# Patient Record
Sex: Male | Born: 1940
Health system: Southern US, Community
[De-identification: ages and names within clinical notes are randomized; demographics above are authoritative.]

## PROBLEM LIST (undated history)

## (undated) DIAGNOSIS — K529 Noninfective gastroenteritis and colitis, unspecified: Secondary | ICD-10-CM

## (undated) DIAGNOSIS — D696 Thrombocytopenia, unspecified: Secondary | ICD-10-CM

## (undated) DIAGNOSIS — Z951 Presence of aortocoronary bypass graft: Secondary | ICD-10-CM

## (undated) DIAGNOSIS — E1121 Type 2 diabetes mellitus with diabetic nephropathy: Secondary | ICD-10-CM

## (undated) DIAGNOSIS — Z87448 Personal history of other diseases of urinary system: Secondary | ICD-10-CM

## (undated) DIAGNOSIS — E1129 Type 2 diabetes mellitus with other diabetic kidney complication: Secondary | ICD-10-CM

## (undated) DIAGNOSIS — M545 Low back pain: Secondary | ICD-10-CM

## (undated) DIAGNOSIS — Z87442 Personal history of urinary calculi: Secondary | ICD-10-CM

## (undated) DIAGNOSIS — I779 Disorder of arteries and arterioles, unspecified: Secondary | ICD-10-CM

## (undated) DIAGNOSIS — Z7901 Long term (current) use of anticoagulants: Secondary | ICD-10-CM

## (undated) DIAGNOSIS — E785 Hyperlipidemia, unspecified: Secondary | ICD-10-CM

## (undated) DIAGNOSIS — M1990 Unspecified osteoarthritis, unspecified site: Secondary | ICD-10-CM

## (undated) DIAGNOSIS — I2510 Atherosclerotic heart disease of native coronary artery without angina pectoris: Secondary | ICD-10-CM

## (undated) DIAGNOSIS — IMO0002 Reserved for concepts with insufficient information to code with codable children: Secondary | ICD-10-CM

## (undated) DIAGNOSIS — J449 Chronic obstructive pulmonary disease, unspecified: Secondary | ICD-10-CM

## (undated) DIAGNOSIS — I10 Essential (primary) hypertension: Secondary | ICD-10-CM

## (undated) DIAGNOSIS — D649 Anemia, unspecified: Secondary | ICD-10-CM

## (undated) DIAGNOSIS — I4891 Unspecified atrial fibrillation: Secondary | ICD-10-CM

## (undated) DIAGNOSIS — I350 Nonrheumatic aortic (valve) stenosis: Secondary | ICD-10-CM

## (undated) DIAGNOSIS — K922 Gastrointestinal hemorrhage, unspecified: Secondary | ICD-10-CM

## (undated) DIAGNOSIS — Z8719 Personal history of other diseases of the digestive system: Secondary | ICD-10-CM

## (undated) DIAGNOSIS — K219 Gastro-esophageal reflux disease without esophagitis: Secondary | ICD-10-CM

## (undated) HISTORY — DX: Type 2 diabetes mellitus with diabetic nephropathy: E11.21

## (undated) HISTORY — DX: Long term (current) use of anticoagulants: Z79.01

## (undated) HISTORY — DX: Presence of aortocoronary bypass graft: Z95.1

## (undated) HISTORY — DX: Type 2 diabetes mellitus with other diabetic kidney complication: E11.29

## (undated) HISTORY — DX: Essential (primary) hypertension: I10

## (undated) HISTORY — DX: Personal history of other diseases of the digestive system: Z87.19

## (undated) HISTORY — DX: Nonrheumatic aortic (valve) stenosis: I35.0

## (undated) HISTORY — DX: Unspecified atrial fibrillation: I48.91

## (undated) HISTORY — DX: Reserved for concepts with insufficient information to code with codable children: IMO0002

## (undated) HISTORY — DX: Hyperlipidemia, unspecified: E78.5

## (undated) HISTORY — DX: Low back pain: M54.5

## (undated) HISTORY — DX: Atherosclerotic heart disease of native coronary artery without angina pectoris: I25.10

---

## 1995-09-10 HISTORY — PX: CARDIAC SURGERY: SHX584

## 1995-09-10 HISTORY — PX: CORONARY ARTERY BYPASS GRAFT: SHX141

## 1998-05-02 ENCOUNTER — Encounter: Payer: Self-pay | Admitting: Emergency Medicine

## 1998-05-02 ENCOUNTER — Inpatient Hospital Stay (HOSPITAL_COMMUNITY): Admission: EM | Admit: 1998-05-02 | Discharge: 1998-05-03 | Payer: Self-pay | Admitting: Emergency Medicine

## 2002-09-09 HISTORY — PX: BACK SURGERY: SHX140

## 2003-08-20 ENCOUNTER — Ambulatory Visit (HOSPITAL_COMMUNITY): Admission: RE | Admit: 2003-08-20 | Discharge: 2003-08-20 | Payer: Self-pay | Admitting: Specialist

## 2003-08-29 ENCOUNTER — Ambulatory Visit (HOSPITAL_COMMUNITY): Admission: RE | Admit: 2003-08-29 | Discharge: 2003-08-29 | Payer: Self-pay | Admitting: Specialist

## 2003-09-05 ENCOUNTER — Ambulatory Visit (HOSPITAL_COMMUNITY): Admission: RE | Admit: 2003-09-05 | Discharge: 2003-09-06 | Payer: Self-pay | Admitting: Interventional Radiology

## 2003-09-10 HISTORY — PX: SHOULDER SURGERY: SHX246

## 2003-09-15 ENCOUNTER — Observation Stay (HOSPITAL_COMMUNITY): Admission: RE | Admit: 2003-09-15 | Discharge: 2003-09-16 | Payer: Self-pay | Admitting: Specialist

## 2003-12-28 ENCOUNTER — Observation Stay (HOSPITAL_COMMUNITY): Admission: RE | Admit: 2003-12-28 | Discharge: 2003-12-29 | Payer: Self-pay | Admitting: Specialist

## 2006-01-08 ENCOUNTER — Encounter: Payer: Self-pay | Admitting: Specialist

## 2012-06-09 DIAGNOSIS — J3089 Other allergic rhinitis: Secondary | ICD-10-CM | POA: Insufficient documentation

## 2012-06-09 DIAGNOSIS — L989 Disorder of the skin and subcutaneous tissue, unspecified: Secondary | ICD-10-CM | POA: Insufficient documentation

## 2012-06-09 DIAGNOSIS — D509 Iron deficiency anemia, unspecified: Secondary | ICD-10-CM | POA: Insufficient documentation

## 2012-06-09 DIAGNOSIS — Z72820 Sleep deprivation: Secondary | ICD-10-CM | POA: Insufficient documentation

## 2014-05-06 DIAGNOSIS — IMO0002 Reserved for concepts with insufficient information to code with codable children: Secondary | ICD-10-CM

## 2014-05-06 HISTORY — DX: Reserved for concepts with insufficient information to code with codable children: IMO0002

## 2015-10-26 DIAGNOSIS — E785 Hyperlipidemia, unspecified: Secondary | ICD-10-CM

## 2015-10-26 HISTORY — DX: Hyperlipidemia, unspecified: E78.5

## 2015-12-20 DIAGNOSIS — Z7901 Long term (current) use of anticoagulants: Secondary | ICD-10-CM

## 2015-12-20 DIAGNOSIS — I482 Chronic atrial fibrillation, unspecified: Secondary | ICD-10-CM

## 2015-12-20 DIAGNOSIS — I4891 Unspecified atrial fibrillation: Secondary | ICD-10-CM | POA: Insufficient documentation

## 2015-12-20 DIAGNOSIS — Z8719 Personal history of other diseases of the digestive system: Secondary | ICD-10-CM

## 2015-12-20 HISTORY — DX: Personal history of other diseases of the digestive system: Z87.19

## 2015-12-20 HISTORY — DX: Chronic atrial fibrillation, unspecified: I48.20

## 2015-12-20 HISTORY — DX: Long term (current) use of anticoagulants: Z79.01

## 2016-01-05 DIAGNOSIS — E1121 Type 2 diabetes mellitus with diabetic nephropathy: Secondary | ICD-10-CM

## 2016-01-05 DIAGNOSIS — I10 Essential (primary) hypertension: Secondary | ICD-10-CM

## 2016-01-05 DIAGNOSIS — I2510 Atherosclerotic heart disease of native coronary artery without angina pectoris: Secondary | ICD-10-CM

## 2016-01-05 DIAGNOSIS — I25119 Atherosclerotic heart disease of native coronary artery with unspecified angina pectoris: Secondary | ICD-10-CM | POA: Insufficient documentation

## 2016-01-05 HISTORY — DX: Type 2 diabetes mellitus with diabetic nephropathy: E11.21

## 2016-01-05 HISTORY — DX: Essential (primary) hypertension: I10

## 2016-01-05 HISTORY — DX: Atherosclerotic heart disease of native coronary artery without angina pectoris: I25.10

## 2016-05-02 DIAGNOSIS — M545 Low back pain, unspecified: Secondary | ICD-10-CM

## 2016-05-02 HISTORY — DX: Low back pain, unspecified: M54.50

## 2016-07-03 DIAGNOSIS — Z951 Presence of aortocoronary bypass graft: Secondary | ICD-10-CM | POA: Insufficient documentation

## 2016-07-03 DIAGNOSIS — E1129 Type 2 diabetes mellitus with other diabetic kidney complication: Secondary | ICD-10-CM

## 2016-07-03 HISTORY — DX: Presence of aortocoronary bypass graft: Z95.1

## 2016-07-03 HISTORY — DX: Type 2 diabetes mellitus with other diabetic kidney complication: E11.29

## 2016-09-20 ENCOUNTER — Other Ambulatory Visit: Payer: Self-pay | Admitting: Physician Assistant

## 2016-09-20 ENCOUNTER — Emergency Department
Admission: EM | Admit: 2016-09-20 | Discharge: 2016-09-20 | Disposition: A | Discharge status: Home / Self Care | Payer: Medicare Other | Source: Home / Self Care | Attending: Family Medicine | Admitting: Family Medicine

## 2016-09-20 ENCOUNTER — Encounter: Payer: Self-pay | Admitting: *Deleted

## 2016-09-20 ENCOUNTER — Emergency Department (INDEPENDENT_AMBULATORY_CARE_PROVIDER_SITE_OTHER): Payer: Medicare Other

## 2016-09-20 DIAGNOSIS — J209 Acute bronchitis, unspecified: Secondary | ICD-10-CM

## 2016-09-20 DIAGNOSIS — R05 Cough: Secondary | ICD-10-CM | POA: Diagnosis not present

## 2016-09-20 DIAGNOSIS — R0789 Other chest pain: Secondary | ICD-10-CM | POA: Diagnosis not present

## 2016-09-20 DIAGNOSIS — R0602 Shortness of breath: Secondary | ICD-10-CM | POA: Diagnosis not present

## 2016-09-20 HISTORY — DX: Gastro-esophageal reflux disease without esophagitis: K21.9

## 2016-09-20 MED ORDER — DEXAMETHASONE SODIUM PHOSPHATE 10 MG/ML IJ SOLN
10.0000 mg | Freq: Once | INTRAMUSCULAR | Status: AC
  Administered 2016-09-20: 10 mg via INTRAMUSCULAR

## 2016-09-20 MED ORDER — ALBUTEROL SULFATE HFA 108 (90 BASE) MCG/ACT IN AERS: 1.0000 | INHALATION_SPRAY | Freq: Four times a day (QID) | RESPIRATORY_TRACT | 0 refills | Status: DC | PRN

## 2016-09-20 MED ORDER — AEROCHAMBER PLUS W/MASK MISC: 2 refills | Status: DC

## 2016-09-20 MED ORDER — AZITHROMYCIN 250 MG PO TABS: 250.0000 mg | ORAL_TABLET | Freq: Every day | ORAL | 0 refills | Status: DC

## 2016-09-20 MED ORDER — BENZONATATE 100 MG PO CAPS: 100.0000 mg | ORAL_CAPSULE | Freq: Three times a day (TID) | ORAL | 0 refills | Status: DC

## 2016-09-20 MED ORDER — IPRATROPIUM-ALBUTEROL 0.5-2.5 (3) MG/3ML IN SOLN
3.0000 mL | Freq: Once | RESPIRATORY_TRACT | Status: AC
  Administered 2016-09-20: 3 mL via RESPIRATORY_TRACT

## 2016-09-20 NOTE — ED Provider Notes (Signed)
CSN: [id number]     Arrival date & time 09/20/16  1551 History   First MD Initiated Contact with Patient 09/20/16 1627     Chief Complaint  Patient presents with  . Cough   (Consider location/radiation/quality/duration/timing/severity/associated sxs/prior Treatment) HPI Garrett Hughes is a 76 y.o. male presenting to UC with hx of afib, accompanied by his wife with reports of 3-4 days of cough, congestion, fatigue, and Right side rib pain from cough. Worse today. Mild SOB.  Hx of afib but no hx of asthma or COPD.  Denies leg swelling. No known fever or chills.      Past Medical History:  Diagnosis Date  . Atrial fibrillation (HCC)   . Diabetes mellitus without complication (HCC)   . GERD (gastroesophageal reflux disease)   . Hyperlipidemia    History reviewed. No pertinent surgical history. History reviewed. No pertinent family history. Social History  Substance Use Topics  . Smoking status: Former Games developermoker  . Smokeless tobacco: Never Used  . Alcohol use No    Review of Systems  Constitutional: Positive for fatigue. Negative for chills and fever.  HENT: Positive for congestion and rhinorrhea. Negative for ear pain, sore throat, trouble swallowing and voice change.   Respiratory: Positive for cough, chest tightness and shortness of breath.   Cardiovascular: Negative for chest pain and palpitations.  Gastrointestinal: Negative for abdominal pain, diarrhea, nausea and vomiting.  Musculoskeletal: Negative for arthralgias, back pain and myalgias.  Skin: Negative for rash.    Allergies  Patient has no known allergies.  Home Medications   Prior to Admission medications   Medication Sig Start Date End Date Taking? Authorizing Provider  clopidogrel (PLAVIX) 75 MG tablet Take 75 mg by mouth daily.   Yes Historical Provider, MD  doxazosin (CARDURA) 2 MG tablet Take 2 mg by mouth daily.   Yes Historical Provider, MD  glipiZIDE (GLUCOTROL) 10 MG tablet Take 10 mg by mouth daily before  breakfast.   Yes Historical Provider, MD  lisinopril (PRINIVIL,ZESTRIL) 10 MG tablet Take 10 mg by mouth daily.   Yes Historical Provider, MD  metFORMIN (GLUCOPHAGE) 500 MG tablet Take by mouth 2 (two) times daily with a meal.   Yes Historical Provider, MD  metoprolol (LOPRESSOR) 50 MG tablet Take 50 mg by mouth 2 (two) times daily.   Yes Historical Provider, MD  omeprazole (PRILOSEC) 20 MG capsule Take 20 mg by mouth daily.   Yes Historical Provider, MD  pravastatin (PRAVACHOL) 20 MG tablet Take 20 mg by mouth daily.   Yes Historical Provider, MD  albuterol (PROVENTIL HFA;VENTOLIN HFA) 108 (90 Base) MCG/ACT inhaler Inhale 1-2 puffs into the lungs every 6 (six) hours as needed for wheezing or shortness of breath. 09/20/16   Junius FinnerErin O'Malley, PA-C  azithromycin (ZITHROMAX) 250 MG tablet Take 1 tablet (250 mg total) by mouth daily. Take first 2 tablets together, then 1 every day until finished. 09/20/16   Junius FinnerErin O'Malley, PA-C  benzonatate (TESSALON) 100 MG capsule Take 1 capsule (100 mg total) by mouth every 8 (eight) hours. 09/20/16   Junius FinnerErin O'Malley, PA-C  Spacer/Aero-Holding Chambers (AEROCHAMBER PLUS WITH MASK) inhaler Use as instructed 09/20/16   Junius FinnerErin O'Malley, PA-C   Meds Ordered and Administered this Visit   Medications  dexamethasone (DECADRON) injection 10 mg (10 mg Intramuscular Given 09/20/16 1723)  ipratropium-albuterol (DUONEB) 0.5-2.5 (3) MG/3ML nebulizer solution 3 mL (3 mLs Nebulization Given 09/20/16 1718)    BP 136/66 (BP Location: Left Arm)   Pulse 96  Temp 99.6 F (37.6 C) (Oral)   Resp 18   Ht 5\' 4"  (1.626 m)   Wt 150 lb (68 kg)   SpO2 90%   BMI 25.75 kg/m  No data found.   Physical Exam  Constitutional: He is oriented to person, place, and time. He appears well-developed and well-nourished. No distress.  HENT:  Head: Normocephalic and atraumatic.  Right Ear: Tympanic membrane normal.  Left Ear: Tympanic membrane normal.  Nose: Nose normal.  Mouth/Throat: Uvula is  midline, oropharynx is clear and moist and mucous membranes are normal.  Eyes: EOM are normal.  Neck: Normal range of motion. Neck supple.  Cardiovascular: Normal rate.  An irregularly irregular rhythm present.  Pulmonary/Chest: Effort normal. No stridor. No respiratory distress. He has wheezes. He has rales. He exhibits no tenderness.  Diffuse wheeze and coarse breath sounds w/o accessory muscle use.  Musculoskeletal: Normal range of motion.  Lymphadenopathy:    He has no cervical adenopathy.  Neurological: He is alert and oriented to person, place, and time.  Skin: Skin is warm and dry. He is not diaphoretic.  Psychiatric: He has a normal mood and affect. His behavior is normal.  Nursing note and vitals reviewed.   Urgent Care Course   Clinical Course     Procedures (including critical care time)  Labs Review Labs Reviewed - No data to display  Imaging Review Dg Chest 2 View  Result Date: 09/20/2016 CLINICAL DATA:  Shortness of breath, cough for 3 days, right side chest pain EXAM: CHEST  2 VIEW COMPARISON:  None. FINDINGS: Cardiomediastinal silhouette is unremarkable. Status post CABG. There is streaky left base retrocardiac atelectasis or infiltrate best seen on lateral view. No pulmonary edema. IMPRESSION: Streaky left base retrocardiac atelectasis or infiltrate best seen on lateral view. No pulmonary edema. Electronically Signed   By: Natasha Mead M.D.   On: 09/20/2016 17:08    MDM   1. Acute bronchitis, unspecified organism    Pt c/o worsening URI symptoms with cough and SOB. O2 Sat 90% on RA.  CXR: concerning for possible early pneumonia  Duoneb and decadron 10mg  IM given in UC O2 Sat improved to 94% on RA Diffuse coarse breath sounds still present. Pt and wife feel comfortable with pt being discharged home at this time.   Rx: Azithromycin, tessalon and albuterol inhaler with spacer   Discussed symptoms that warrant emergent care in the ED. Wife and pt agreeable  with tx plan.      Junius Finner, PA-C 09/20/16 1808

## 2016-09-20 NOTE — ED Triage Notes (Signed)
Pt c/o nonproductive cough and RT rib area pain x 3 days, worse x 1 day. Denies fever.

## 2016-09-22 ENCOUNTER — Telehealth: Payer: Self-pay | Admitting: Emergency Medicine

## 2016-09-22 NOTE — Telephone Encounter (Signed)
Pt states that he is feeling somewhat better but will continue to improve.  Will f/u w/PCP if needed.  TMartin,CMA

## 2017-01-01 DIAGNOSIS — I350 Nonrheumatic aortic (valve) stenosis: Secondary | ICD-10-CM | POA: Insufficient documentation

## 2017-04-30 ENCOUNTER — Encounter: Payer: Self-pay | Admitting: Emergency Medicine

## 2017-04-30 ENCOUNTER — Emergency Department
Admission: EM | Admit: 2017-04-30 | Discharge: 2017-04-30 | Disposition: A | Discharge status: Home / Self Care | Payer: Medicare Other | Source: Home / Self Care | Attending: Family Medicine | Admitting: Family Medicine

## 2017-04-30 ENCOUNTER — Emergency Department (INDEPENDENT_AMBULATORY_CARE_PROVIDER_SITE_OTHER): Payer: Medicare Other

## 2017-04-30 DIAGNOSIS — R6884 Jaw pain: Secondary | ICD-10-CM | POA: Diagnosis not present

## 2017-04-30 DIAGNOSIS — M26622 Arthralgia of left temporomandibular joint: Secondary | ICD-10-CM | POA: Diagnosis not present

## 2017-04-30 MED ORDER — METHOCARBAMOL 500 MG PO TABS: 500.0000 mg | ORAL_TABLET | Freq: Two times a day (BID) | ORAL | 0 refills | Status: DC

## 2017-04-30 MED ORDER — HYDROCODONE-ACETAMINOPHEN 5-325 MG PO TABS: 1.0000 | ORAL_TABLET | ORAL | 0 refills | Status: DC | PRN

## 2017-04-30 MED ORDER — TRAMADOL HCL 50 MG PO TABS: 50.0000 mg | ORAL_TABLET | Freq: Four times a day (QID) | ORAL | 0 refills | Status: DC | PRN

## 2017-04-30 NOTE — Discharge Instructions (Signed)
°  Norco/Vicodin (hydrocodone-acetaminophen) is a narcotic pain medication, do not combine these medications with others containing tylenol. While taking, do not drink alcohol, drive, or perform any other activities that requires focus while taking these medications.    Robaxin (methocarbamol) is a muscle relaxer and may cause drowsiness. Do not drink alcohol, drive, or operate heavy machinery while taking.

## 2017-04-30 NOTE — ED Provider Notes (Signed)
[facility]    CSN: [id number] Arrival date & time: 04/30/17  0940     History   Chief Complaint Chief Complaint  Patient presents with  . Jaw Pain    HPI Garrett Hughes is a 76 y.o. male.   HPI Garrett Hughes is a 76 y.o. male presenting to UC with c/o 3 weeks of Left sided jaw pain that is aching and sore, worse with opening his mouth and chewing.  He reports having lower teeth pulled 3 weeks ago and had multiple days of procedures the same week. He completed a course of amoxicillin.  His lower jaw feels good but pt and wife are concerned his pain is coming from his jaw joint and want to make sure it is still in place.  Pt states he went back to the dentist to get his upper teeth plate re-fitted but states no relief of pain. They did not do any x-rays when evaluated him for the pain. Denies fever, chills, n/v/d. No warmth or redness in area of pain.    Past Medical History:  Diagnosis Date  . Atrial fibrillation (HCC)   . Diabetes mellitus without complication (HCC)   . GERD (gastroesophageal reflux disease)   . Hyperlipidemia     There are no active problems to display for this patient.   History reviewed. No pertinent surgical history.     Home Medications    Prior to Admission medications   Medication Sig Start Date End Date Taking? Authorizing Provider  albuterol (PROVENTIL HFA;VENTOLIN HFA) 108 (90 Base) MCG/ACT inhaler Inhale 1-2 puffs into the lungs every 6 (six) hours as needed for wheezing or shortness of breath. 09/20/16   Lurene Shadow, PA-C  clopidogrel (PLAVIX) 75 MG tablet Take 75 mg by mouth daily.    [provider]  doxazosin (CARDURA) 2 MG tablet Take 2 mg by mouth daily.    [provider]  glipiZIDE (GLUCOTROL) 10 MG tablet Take 10 mg by mouth daily before breakfast.    [provider]  HYDROcodone-acetaminophen (NORCO/VICODIN) 5-325 MG tablet Take 1 tablet by mouth every 4 (four) hours as needed for severe  pain. 04/30/17   Lurene Shadow, PA-C  lisinopril (PRINIVIL,ZESTRIL) 10 MG tablet Take 10 mg by mouth daily.    [provider]  metFORMIN (GLUCOPHAGE) 500 MG tablet Take by mouth 2 (two) times daily with a meal.    [provider]  methocarbamol (ROBAXIN) 500 MG tablet Take 1 tablet (500 mg total) by mouth 2 (two) times daily. 04/30/17   Lurene Shadow, PA-C  metoprolol (LOPRESSOR) 50 MG tablet Take 50 mg by mouth 2 (two) times daily.    [provider]  omeprazole (PRILOSEC) 20 MG capsule Take 20 mg by mouth daily.    [provider]  pravastatin (PRAVACHOL) 20 MG tablet Take 20 mg by mouth daily.    [provider]  Spacer/Aero-Holding Chambers (AEROCHAMBER PLUS WITH MASK) inhaler Use as instructed 09/20/16   Lurene Shadow, PA-C    Family History No family history on file.  Social History Social History  Substance Use Topics  . Smoking status: Former Games developer  . Smokeless tobacco: Never Used  . Alcohol use No     Allergies   Patient has no known allergies.   Review of Systems Review of Systems  Constitutional: Negative for chills and fever.  HENT: Positive for dental problem. Negative for facial swelling, mouth sores and sore throat.  Skin: Negative for rash and wound.     Physical Exam Triage Vital Signs ED Triage Vitals  Enc Vitals Group     BP 04/30/17 0952 123/64     Pulse Rate 04/30/17 0952 65     Resp --      Temp 04/30/17 0952 97.8 F (36.6 C)     Temp Source 04/30/17 0952 Oral     SpO2 04/30/17 0952 96 %     Weight 04/30/17 0952 144 lb (65.3 kg)     Height 04/30/17 0952 5\' 4"  (1.626 m)     Head Circumference --      Peak Flow --      Pain Score 04/30/17 0953 10     Pain Loc --      Pain Edu? --      Excl. in GC? --    No data found.   Updated Vital Signs BP 123/64 (BP Location: Left Arm)   Pulse 65   Temp 97.8 F (36.6 C) (Oral)   Ht 5\' 4"  (1.626 m)   Wt 144 lb (65.3 kg)   SpO2 96%   BMI 24.72 kg/m     Visual Acuity Right Eye Distance:   Left Eye Distance:   Bilateral Distance:    Right Eye Near:   Left Eye Near:    Bilateral Near:     Physical Exam  Constitutional: He is oriented to person, place, and time. He appears well-developed and well-nourished.  HENT:  Head: Normocephalic and atraumatic.  Mouth/Throat: Uvula is midline, oropharynx is clear and moist and mucous membranes are normal. He has dentures. There is trismus (mild) in the jaw. Abnormal dentition.  Tenderness to Left TMJ. No crepitus or deformity palpated. Mild trismus. No gingival abscesses noted.   Eyes: EOM are normal.  Neck: Normal range of motion.  Cardiovascular: Normal rate.   Pulmonary/Chest: Effort normal.  Musculoskeletal: Normal range of motion.  Neurological: He is alert and oriented to person, place, and time.  Skin: Skin is warm and dry.  Psychiatric: He has a normal mood and affect. His behavior is normal.  Nursing note and vitals reviewed.    UC Treatments / Results  Labs (all labs ordered are listed, but only abnormal results are displayed) Labs Reviewed - No data to display  EKG  EKG Interpretation None       Radiology Ct Maxillofacial Wo Contrast  Result Date: 04/30/2017 CLINICAL DATA:  Left-sided jaw pain.  No trauma. EXAM: CT MAXILLOFACIAL WITHOUT CONTRAST TECHNIQUE: Multidetector CT imaging of the maxillofacial structures was performed. Multiplanar CT image reconstructions were also generated. COMPARISON:  None. FINDINGS: Osseous: No fracture or mandibular dislocation. No destructive process. Normal appearance of the temporomandibular joints. Advanced degenerative changes of the cervical spine. Orbits: Negative. No traumatic or inflammatory finding. Right pseudophakia. Sinuses: The bilateral paranasal sinuses and mastoid air cells are clear. Prior bilateral maxillary antrostomies and resection of the right inferior turbinate. Soft tissues: Negative. Limited intracranial: No  significant or unexpected finding. IMPRESSION: 1. No acute osseous abnormality. Normal appearance of the bilateral temporomandibular joints. Electronically Signed   By: Obie Dredge M.D.   On: 04/30/2017 10:28    Procedures Procedures (including critical care time)  Medications Ordered in UC Medications - No data to display   Initial Impression / Assessment and Plan / UC Course  I have reviewed the triage vital signs and the nursing notes.  Pertinent labs & imaging results that were available during my care of the patient  were reviewed by me and considered in my medical decision making (see chart for details).     Pain in Left TMJ w/o evidence of underlying infection. CT maxillofacial: normal  Final Clinical Impressions(s) / UC Diagnoses   Final diagnoses:  Arthralgia of left temporomandibular joint   Discussed imaging with pt. Discussed pain medication and f/u with Dentist.  Pt states tramadol does not help his pain and vicodin only minimally helps pain. Will not take more if medication is not helping. Willing to try a muscle relaxer to see if that helps with the pain. Advised pt both medications can cause drowsiness. Use caution while taking. F/u with dentist for further evaluation and treatment of Left TMJ pain. Pt given CD with CT report to bring with him to the dentist.   New Prescriptions Discharge Medication List as of 04/30/2017 10:46 AM    START taking these medications   Details  HYDROcodone-acetaminophen (NORCO/VICODIN) 5-325 MG tablet Take 1 tablet by mouth every 4 (four) hours as needed for severe pain., Starting Wed 04/30/2017, Print    methocarbamol (ROBAXIN) 500 MG tablet Take 1 tablet (500 mg total) by mouth 2 (two) times daily., Starting Wed 04/30/2017, Normal         Controlled Substance Prescriptions [place] Controlled Substance Registry consulted? Yes, I have consulted the [place] Controlled Substances Registry for this patient, and feel the risk/benefit ratio  today is favorable for proceeding with this prescription for a controlled substance.   Lurene Shadow, [place] 04/30/17 1130

## 2017-04-30 NOTE — ED Triage Notes (Signed)
Left jaw pain x 3 weeks after having lower teeth pulled, went back to dentist, with no results

## 2017-05-06 ENCOUNTER — Encounter: Payer: Self-pay | Admitting: Osteopathic Medicine

## 2017-05-06 ENCOUNTER — Ambulatory Visit (INDEPENDENT_AMBULATORY_CARE_PROVIDER_SITE_OTHER): Payer: Medicare Other | Admitting: Osteopathic Medicine

## 2017-05-06 VITALS — BP 124/71 | HR 87 | Ht 64.0 in | Wt 148.0 lb

## 2017-05-06 DIAGNOSIS — I350 Nonrheumatic aortic (valve) stenosis: Secondary | ICD-10-CM

## 2017-05-06 DIAGNOSIS — I2510 Atherosclerotic heart disease of native coronary artery without angina pectoris: Secondary | ICD-10-CM | POA: Diagnosis not present

## 2017-05-06 DIAGNOSIS — E782 Mixed hyperlipidemia: Secondary | ICD-10-CM | POA: Diagnosis not present

## 2017-05-06 DIAGNOSIS — I482 Chronic atrial fibrillation: Secondary | ICD-10-CM | POA: Diagnosis not present

## 2017-05-06 DIAGNOSIS — Z951 Presence of aortocoronary bypass graft: Secondary | ICD-10-CM | POA: Diagnosis not present

## 2017-05-06 DIAGNOSIS — Z7901 Long term (current) use of anticoagulants: Secondary | ICD-10-CM | POA: Diagnosis not present

## 2017-05-06 DIAGNOSIS — Z23 Encounter for immunization: Secondary | ICD-10-CM

## 2017-05-06 DIAGNOSIS — E119 Type 2 diabetes mellitus without complications: Secondary | ICD-10-CM | POA: Diagnosis not present

## 2017-05-06 DIAGNOSIS — I10 Essential (primary) hypertension: Secondary | ICD-10-CM | POA: Diagnosis not present

## 2017-05-06 DIAGNOSIS — I4821 Permanent atrial fibrillation: Secondary | ICD-10-CM

## 2017-05-06 DIAGNOSIS — Z8719 Personal history of other diseases of the digestive system: Secondary | ICD-10-CM

## 2017-05-06 LAB — POCT GLYCOSYLATED HEMOGLOBIN (HGB A1C): Hemoglobin A1C: 6.4

## 2017-05-06 NOTE — Progress Notes (Signed)
HPI: Garrett Hughes is a 76 y.o. male  who presents to [facility] today, 05/06/17,  for chief complaint of:  Chief Complaint  Patient presents with  . Establish Care    Very pleasant new patient here to establish care. Previously following at [facility]. Following with cardiology about twice per year for A. fib and CAD. Available records were reviewed in care everywhere.  CARDIOVASCULAR - following with Cardiologist Hx CABG/CAD: Currently no chest pain or claudication. AFib: History of GI bleed on warfarin, patient currently on Plavix alone, again he is following with cardiology and aware of stroke risk. He is on GI protection with PPI as noted below Plavix 75 mg daily Metoprolol ER 25 daily Pravastatin 20 mg daily  RESPIRATORY  on Albuterol prn w/o Hx COPD or asthma - sounds like this is an old medicine   URINARY/REPRODUCTIVE Doxazosin 8 mg daily  GASTROINTESTINAL Hx GERD w/ Barrett's Esophagus, states most recent EGD showed that this had resolved. History of GI bleed on anticoagulation. Protonix 20 mg daily   ENDOCRINE DM2 not on insulin but w/ CKD though on my review of the labs GFR is fine. He is taking glipizide but not noticing any drops in blood sugar. Tolerating metformin and glipizide well. Compliant with diabetic diet, exercising daily Glipizide 5 mg bid Metformin 1000 mg bid   Patient is accompanied by wife who assists with history-taking.   Past medical, surgical, social and family history reviewed: Patient Active Problem List   Diagnosis Date Noted  . Aortic stenosis, mild 01/01/2017  . 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 Type 2 diabetes mellitus with kidney complication, without long-term current use of insulin (HCC) 07/03/2016  . Low back pain 05/02/2016  . Atherosclerosis of native coronary artery of native heart without angina pectoris 01/05/2016  . Benign essential hypertension 01/05/2016  . Diabetic kidney disease (HCC)  01/05/2016  . A-fib (HCC) 12/20/2015  . Current use of long term anticoagulation 12/20/2015  . History of Barrett's esophagus 12/20/2015  . Hyperlipemia 10/26/2015  . Cataract, nuclear, right 05/06/2014   No past surgical history on file. Social History  Substance Use Topics  . Smoking status: Former Games developer  . Smokeless tobacco: Never Used  . Alcohol use No   No family history on file.   Current medication list and allergy/intolerance information reviewed:   Current Outpatient Prescriptions  Medication Sig Dispense Refill  . albuterol (PROVENTIL HFA;VENTOLIN HFA) 108 (90 Base) MCG/ACT inhaler Inhale 1-2 puffs into the lungs every 6 (six) hours as needed for wheezing or shortness of breath. 1 Inhaler 0  . clopidogrel (PLAVIX) 75 MG tablet Take 75 mg by mouth daily.    Marland Kitchen doxazosin (CARDURA) 2 MG tablet Take 2 mg by mouth daily.    Marland Kitchen lisinopril (PRINIVIL,ZESTRIL) 5 MG tablet Take 5 mg by mouth daily.     . metFORMIN (GLUCOPHAGE) 1000 MG tablet Take 1,000 mg by mouth 2 (two) times daily with a meal.     . metoprolol (LOPRESSOR) 50 MG tablet Take 50 mg by mouth 2 (two) times daily.    . pantoprazole (PROTONIX) 20 MG tablet Take 20 mg by mouth daily.    . pravastatin (PRAVACHOL) 40 MG tablet Take 40 mg by mouth.    . Spacer/Aero-Holding Chambers (AEROCHAMBER PLUS WITH MASK) inhaler Use as instructed 1 each 2  . glipiZIDE (GLUCOTROL) 5 MG tablet Take 5 mg by mouth 2 (two) times daily before a meal.  No current facility-administered medications for this visit.    Allergies  Allergen Reactions  . Atorvastatin     Other reaction(s): Myalgias (intolerance)  . Cholestyramine Other (See Comments)    Constipation      Review of Systems:  Constitutional:  No  fever, no chills, No recent illness, No unintentional weight changes. No significant fatigue.   HEENT: No  headache, no vision change, no hearing change, No sore throat, No  sinus pressure  Cardiac: No  chest pain, No   pressure, No palpitations, No  Orthopnea  Respiratory:  No  shortness of breath. No  Cough  Gastrointestinal: No  abdominal pain, No  nausea, No  vomiting,  No  blood in stool, No  diarrhea, No  constipation   Musculoskeletal: No new myalgia/arthralgia  Genitourinary: No  incontinence, No  abnormal genital bleeding, No abnormal genital discharge  Skin: No  Rash, No other wounds/concerning lesions  Hem/Onc: No  easy bruising/bleeding, No  abnormal lymph node  Endocrine: No cold intolerance,  No heat intolerance. No polyuria/polydipsia/polyphagia   Neurologic: No  weakness, No  dizziness, No  slurred speech/focal weakness/facial droop  Psychiatric: No  concerns with depression, No  concerns with anxiety, No sleep problems, No mood problems  Exam:  BP 124/71   Pulse 87   Ht 5\' 4"  (1.626 m)   Wt 148 lb (67.1 kg)   BMI 25.40 kg/m   Constitutional: VS see above. General Appearance: alert, well-developed, well-nourished, NAD  Eyes: Normal lids and conjunctive, non-icteric sclera  Ears, Nose, Mouth, Throat: MMM, Normal external inspection ears/nares/mouth/lips/gums.   Neck: No masses, trachea midline.  Respiratory: Normal respiratory effort. no wheeze, no rhonchi, no rales  Cardiovascular: S1/S2 normal, +systolic murmur RSB, no rub/gallop auscultated. Irreg/Irreg. No lower extremity edema.   Musculoskeletal: Gait normal.   Neurological: Normal balance/coordination. No tremor.   Psychiatric: Normal judgment/insight. Normal mood and affect. Oriented x3.    Results for orders placed or performed in visit on 05/06/17 (from the past 72 hour(s))  POCT HgB A1C     Status: None   Collection Time: 05/06/17 10:26 AM  Result Value Ref Range   Hemoglobin A1C 6.4    ASSESSMENT/PLAN: The primary encounter diagnosis was Diabetes mellitus without complication (HCC). Diagnoses of Need for immunization against influenza, Benign essential hypertension, Permanent atrial fibrillation (HCC),  Atherosclerosis of native coronary artery of native heart without angina pectoris, Aortic stenosis, mild, History of Barrett's esophagus, Mixed hyperlipidemia, Hx of CABG, and Current use of long term anticoagulation were also pertinent to this visit.  Orders Placed This Encounter  Procedures  . Flu Vaccine QUAD 36+ mos IM  . CBC  . COMPLETE METABOLIC PANEL WITH GFR  . Lipid panel  . POCT HgB A1C   Chronic medical problems appear to be fairly well controlled. Could consider possibly even discontinuing glipizide, we'll see what next A1c looks like, he's only been getting A1C about once per year.  Otherwise, continue follow-up with cardiology as directed, see me sooner than 4 months if needed    Visit summary with medication list and pertinent instructions was printed for patient to review. All questions at time of visit were answered - patient instructed to contact office with any additional concerns. ER/RTC precautions were reviewed with the patient. Follow-up plan: Return in about 4 months (around 09/05/2017) for RECHECK DIABETES, sooner if needed.  Note: Total time spent 45 minutes, greater than 50% of the visit was spent face-to-face counseling and coordinating care for the  following: The primary encounter diagnosis was Diabetes mellitus without complication (HCC). Diagnoses of Need for immunization against influenza, Benign essential hypertension, Permanent atrial fibrillation (HCC), Atherosclerosis of native coronary artery of native heart without angina pectoris, Aortic stenosis, mild, History of Barrett's esophagus, Mixed hyperlipidemia, Hx of CABG, and Current use of long term anticoagulation were also pertinent to this visit.Marland Kitchen

## 2017-05-07 LAB — COMPLETE METABOLIC PANEL WITH GFR
ALT: 26 U/L (ref 9–46)
AST: 29 U/L (ref 10–35)
Albumin: 3.7 g/dL (ref 3.6–5.1)
Alkaline Phosphatase: 91 U/L (ref 40–115)
BUN: 19 mg/dL (ref 7–25)
CO2: 22 mmol/L (ref 20–32)
Calcium: 8.7 mg/dL (ref 8.6–10.3)
Chloride: 105 mmol/L (ref 98–110)
Creat: 0.66 mg/dL — ABNORMAL LOW (ref 0.70–1.18)
GFR, Est African American: >89 mL/min (ref >=60)
GFR, Est Non African American: >89 mL/min (ref >=60)
Glucose, Bld: 112 mg/dL — ABNORMAL HIGH (ref 65–99)
Potassium: 4.5 mmol/L (ref 3.5–5.3)
Sodium: 141 mmol/L (ref 135–146)
Total Bilirubin: 0.4 mg/dL (ref 0.2–1.2)
Total Protein: 6.3 g/dL (ref 6.1–8.1)

## 2017-05-07 LAB — CBC

## 2017-05-07 LAB — LIPID PANEL
Cholesterol: 127 mg/dL (ref <200)
HDL: 72 mg/dL (ref >40)
LDL Cholesterol: 44 mg/dL (ref <100)
Total CHOL/HDL Ratio: 1.8 Ratio (ref <5.0)
Triglycerides: 53 mg/dL (ref <150)
VLDL: 11 mg/dL (ref <30)

## 2017-05-09 ENCOUNTER — Other Ambulatory Visit: Payer: Self-pay

## 2017-05-09 DIAGNOSIS — I10 Essential (primary) hypertension: Secondary | ICD-10-CM

## 2017-05-09 LAB — CBC WITH DIFFERENTIAL/PLATELET
Basophils Absolute: 0 cells/uL (ref 0–200)
Basophils Relative: 0 %
Eosinophils Absolute: 59 cells/uL (ref 15–500)
Eosinophils Relative: 1 %
HCT: 30.4 % — ABNORMAL LOW (ref 38.5–50.0)
Hemoglobin: 9.7 g/dL — ABNORMAL LOW (ref 13.2–17.1)
Lymphocytes Relative: 19 %
Lymphs Abs: 1121 cells/uL (ref 850–3900)
MCH: 27.7 pg (ref 27.0–33.0)
MCHC: 31.9 g/dL — ABNORMAL LOW (ref 32.0–36.0)
MCV: 86.9 fL (ref 80.0–100.0)
MPV: 10.6 fL (ref 7.5–12.5)
Monocytes Absolute: 708 cells/uL (ref 200–950)
Monocytes Relative: 12 %
Neutro Abs: 4012 cells/uL (ref 1500–7800)
Neutrophils Relative %: 68 %
Platelets: 151 10*3/uL (ref 140–400)
RBC: 3.5 MIL/uL — ABNORMAL LOW (ref 4.20–5.80)
RDW: 17.6 % — ABNORMAL HIGH (ref 11.0–15.0)
WBC: 5.9 10*3/uL (ref 3.8–10.8)

## 2017-05-13 ENCOUNTER — Other Ambulatory Visit: Payer: Self-pay | Admitting: Osteopathic Medicine

## 2017-05-13 DIAGNOSIS — D509 Iron deficiency anemia, unspecified: Secondary | ICD-10-CM

## 2017-05-13 DIAGNOSIS — D649 Anemia, unspecified: Secondary | ICD-10-CM

## 2017-05-13 NOTE — Progress Notes (Signed)
Lab orders placed per PCP lab result note.

## 2017-05-14 LAB — IRON,TIBC AND FERRITIN PANEL
%SAT: 10 % — ABNORMAL LOW (ref 15–60)
Ferritin: 13 ng/mL — ABNORMAL LOW (ref 20–380)
Iron: 49 ug/dL — ABNORMAL LOW (ref 50–180)
TIBC: 467 ug/dL — ABNORMAL HIGH (ref 250–425)

## 2017-05-14 LAB — RETICULOCYTES
ABS Retic: 49700 cells/uL (ref 25000–90000)
RBC.: 3.55 MIL/uL — ABNORMAL LOW (ref 4.20–5.80)
Retic Ct Pct: 1.4 %

## 2017-05-14 LAB — PATHOLOGIST SMEAR REVIEW

## 2017-05-16 ENCOUNTER — Encounter: Payer: Self-pay | Admitting: Internal Medicine

## 2017-05-16 MED ORDER — FERROUS SULFATE 325 (65 FE) MG PO TBEC: 325.0000 mg | DELAYED_RELEASE_TABLET | Freq: Two times a day (BID) | ORAL | 1 refills | Status: DC

## 2017-05-16 NOTE — Addendum Note (Signed)
Addended by: Deirdre PippinsALEXANDER, Dymphna Wadley M on: 05/16/2017 12:51 PM   Modules accepted: Orders

## 2017-06-06 ENCOUNTER — Other Ambulatory Visit: Payer: Self-pay

## 2017-06-06 MED ORDER — DOXAZOSIN MESYLATE 2 MG PO TABS: 2.0000 mg | ORAL_TABLET | Freq: Every day | ORAL | 1 refills | Status: DC

## 2017-06-06 MED ORDER — CLOPIDOGREL BISULFATE 75 MG PO TABS: 75.0000 mg | ORAL_TABLET | Freq: Every day | ORAL | 1 refills | Status: DC

## 2017-06-06 MED ORDER — PANTOPRAZOLE SODIUM 20 MG PO TBEC: 20.0000 mg | DELAYED_RELEASE_TABLET | Freq: Every day | ORAL | 1 refills | Status: DC

## 2017-06-06 MED ORDER — LISINOPRIL 5 MG PO TABS: 5.0000 mg | ORAL_TABLET | Freq: Every day | ORAL | 1 refills | Status: DC

## 2017-06-06 NOTE — Telephone Encounter (Signed)
Patient request a refill for Lisinopril,Omeprazole, Cadura and Plavix a 90 day supply has been sent to pharmacy. Rhonda Cunningham,CMA

## 2017-06-09 ENCOUNTER — Ambulatory Visit: Payer: Medicare Other | Admitting: Internal Medicine

## 2017-06-17 ENCOUNTER — Telehealth: Payer: Self-pay | Admitting: Osteopathic Medicine

## 2017-06-17 ENCOUNTER — Other Ambulatory Visit: Payer: Self-pay

## 2017-06-17 DIAGNOSIS — I4821 Permanent atrial fibrillation: Secondary | ICD-10-CM

## 2017-06-17 DIAGNOSIS — Z7901 Long term (current) use of anticoagulants: Secondary | ICD-10-CM

## 2017-06-17 DIAGNOSIS — I350 Nonrheumatic aortic (valve) stenosis: Secondary | ICD-10-CM

## 2017-06-17 DIAGNOSIS — Z951 Presence of aortocoronary bypass graft: Secondary | ICD-10-CM

## 2017-06-17 DIAGNOSIS — I10 Essential (primary) hypertension: Secondary | ICD-10-CM

## 2017-06-17 DIAGNOSIS — I2510 Atherosclerotic heart disease of native coronary artery without angina pectoris: Secondary | ICD-10-CM

## 2017-06-17 MED ORDER — GLIPIZIDE 5 MG PO TABS: 5.0000 mg | ORAL_TABLET | Freq: Two times a day (BID) | ORAL | 0 refills | Status: DC

## 2017-06-17 MED ORDER — METOPROLOL TARTRATE 50 MG PO TABS: 50.0000 mg | ORAL_TABLET | Freq: Two times a day (BID) | ORAL | 0 refills | Status: DC

## 2017-06-17 MED ORDER — PRAVASTATIN SODIUM 40 MG PO TABS: 40.0000 mg | ORAL_TABLET | Freq: Every day | ORAL | 0 refills | Status: DC

## 2017-06-17 MED ORDER — METFORMIN HCL 1000 MG PO TABS: 1000.0000 mg | ORAL_TABLET | Freq: Two times a day (BID) | ORAL | 0 refills | Status: DC

## 2017-06-17 NOTE — Telephone Encounter (Signed)
Referral placed.

## 2017-06-17 NOTE — Telephone Encounter (Signed)
Patient is requesting a referral to a new cardiologist. He reported that his cardiologist passed away. Please advise. Thanks!

## 2017-06-20 ENCOUNTER — Other Ambulatory Visit: Payer: Self-pay | Admitting: Osteopathic Medicine

## 2017-06-20 MED ORDER — METOPROLOL SUCCINATE ER 25 MG PO TB24: 25.0000 mg | ORAL_TABLET | Freq: Every day | ORAL | 3 refills | Status: DC

## 2017-06-20 NOTE — Progress Notes (Signed)
Corrected med list 

## 2017-07-25 ENCOUNTER — Ambulatory Visit: Payer: Medicare Other | Admitting: Internal Medicine

## 2017-07-25 ENCOUNTER — Encounter (INDEPENDENT_AMBULATORY_CARE_PROVIDER_SITE_OTHER): Payer: Self-pay

## 2017-07-25 ENCOUNTER — Other Ambulatory Visit: Payer: Self-pay

## 2017-07-25 ENCOUNTER — Encounter: Payer: Self-pay | Admitting: Internal Medicine

## 2017-07-25 VITALS — BP 138/74 | HR 66 | Ht 64.0 in | Wt 144.0 lb

## 2017-07-25 DIAGNOSIS — Z7902 Long term (current) use of antithrombotics/antiplatelets: Secondary | ICD-10-CM

## 2017-07-25 DIAGNOSIS — D508 Other iron deficiency anemias: Secondary | ICD-10-CM | POA: Diagnosis not present

## 2017-07-25 DIAGNOSIS — K2270 Barrett's esophagus without dysplasia: Secondary | ICD-10-CM

## 2017-07-25 NOTE — Patient Instructions (Addendum)
Follow the instructions on the Hemoccult cards and mail them back to us when you are finished or you may take them directly to the lab in the basement of the [facility] building. We will call you with the results.    I appreciate the opportunity to care for you. Stan Headarl Gessner, MD, [facility] Shar[facility] Jr HospitalFACG

## 2017-07-25 NOTE — Progress Notes (Signed)
Clementeen GrahamBarry P Berberian 76 y.o. 22-Apr-1941 [id number]  Assessment & Plan:   Encounter Diagnoses  Name Primary?  . Other iron deficiency anemia Yes  . Long term current use of antithrombotics/antiplatelets   . Barrett's esophagus without dysplasia    I suspect this anemia has been a while in the making.  He is hemoglobin was 12 in January 2015 it was 11 in February 2017 and August 20 17 his RDW was rising as well.  He has been normocytic over time and remains so.  He may have been losing blood significantly when he was having trouble with the warfarin and has become iron deficient in the interim related to that.  I have recommended a colonoscopy to the patient since it has been 4 years since he has had one but he has declined that.  He understands we could miss a gastrointestinal malignancy.  I do not think repeating an upper endoscopy with a number he has had and the fact that he had one in 2017 makes a whole lot of sense.  Capsule endoscopy could be useful as well.  My overall index of suspicion of malignancy is low but I told him we could not rule that out.  We talked about investigating with Hemoccults and if positive then pursuing an exam.  He has chosen that approach.  Should he need a colonoscopy then I would hold his Plavix we would check with cardiology.  As far as his Barrett's esophagus, I would not repeat any surveillance on him at his age and with the stability and findings.  He has never had dysplasia.  He can continue PPI.  One afterthought that I do have is that he is on Metformin and he could also have concomitant B12 deficiency.  I see that fairly frequently.  I suggest at some point he gets a B12 level.  May be when he returns to primary care unless he is coming back to me.  I appreciate the opportunity to care for this patient. CC: Sunnie NielsenAlexander, Natalie, DO   Subjective:   Chief Complaint: Iron deficiency anemia  HPI The patient is a very nice elderly married man here with his  wife because of a new finding of iron deficiency anemia.  He denies any bleeding.  He probably does not eat food with high iron content.  He has been started on an iron supplement since it was found to have iron deficiency in the last couple of months by a new primary care provider Dr. Sunnie NielsenNatalie Alexander.  He has a long history of GI care which I reviewed, he had a colonoscopy in 2014 that was negative, other than internal hemorrhoids and diverticulosis, he has a history of 3 cm Barrett's esophagus, over the years having had surveillance without dysplasia on biopsies, he has a moderate hiatal hernia, no evidence of Cameron's erosions.  His last upper endoscopy was on Jan 18, 2016 with these findings.  He does not donate blood.  He is felt a little weak.  There has been no change in bowel habits abdominal pains etc.  His sleep cycle is off and he is sleeping during the day instead of at night his wife says.  He has a history of taking warfarin, he had a lot of bruising and hematuria etc. around the time of his colonoscopy in 2014 and that was stopped and he was switched to Plavix which she is on because of previous coronary artery disease and A. fib I think.  He has  a cardiology appointment pending in January as his cardiologist has passed away.  His ferritin was 13 his iron saturation 10% and TIBC 467 on September 4.  His hemoglobin was 9.7 with an MCV of 86.  He was anemic in 2017 mildly with hemoglobins in the 11 tear.  I can see that through care everywhere.  Lab Results  Component Value Date   WBC 5.9 05/09/2017   HGB 9.7 (L) 05/09/2017   HCT 30.4 (L) 05/09/2017   MCV 86.9 05/09/2017   PLT 151 05/09/2017   Lab Results  Component Value Date   FERRITIN 13 (L) 05/13/2017      Allergies  Allergen Reactions  . Atorvastatin     Other reaction(s): Myalgias (intolerance)  . Cholestyramine Other (See Comments)    Constipation   Current Meds  Medication Sig  . clopidogrel (PLAVIX) 75 MG tablet  Take 1 tablet (75 mg total) by mouth daily.  Marland Kitchen. doxazosin (CARDURA) 2 MG tablet Take 1 tablet (2 mg total) by mouth daily.  . ferrous sulfate 325 (65 FE) MG EC tablet Take 1 tablet (325 mg total) by mouth 2 (two) times daily with breakfast and lunch. Every other day  . glipiZIDE (GLUCOTROL) 5 MG tablet Take 1 tablet (5 mg total) by mouth 2 (two) times daily before a meal.  . lisinopril (PRINIVIL,ZESTRIL) 5 MG tablet Take 1 tablet (5 mg total) by mouth daily.  . metFORMIN (GLUCOPHAGE) 1000 MG tablet Take 1 tablet (1,000 mg total) by mouth 2 (two) times daily with a meal.  . metoprolol succinate (TOPROL-XL) 25 MG 24 hr tablet Take 1 tablet (25 mg total) by mouth daily.  . pantoprazole (PROTONIX) 20 MG tablet Take 1 tablet (20 mg total) by mouth daily.  . pravastatin (PRAVACHOL) 40 MG tablet Take 1 tablet (40 mg total) by mouth daily.   Past Medical History:  Diagnosis Date  . A-fib (HCC) 12/20/2015  . Aortic stenosis, mild 01/01/2017  . Atherosclerosis of native coronary artery of native heart without angina pectoris 01/05/2016  . Atrial fibrillation (HCC)   . Benign essential hypertension 01/05/2016  . Cataract, nuclear, right 05/06/2014  . Current use of long term anticoagulation 12/20/2015  . Diabetes mellitus without complication (HCC)   . Diabetic kidney disease (HCC) 01/05/2016  . GERD (gastroesophageal reflux disease)   . History of Barrett's esophagus 12/20/2015  . 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 Hyperlipemia 10/26/2015  . Hyperlipidemia   . Low back pain 05/02/2016  . Type 2 diabetes mellitus with kidney complication, without long-term current use of insulin (HCC) 07/03/2016   Past Surgical History:  Procedure Laterality Date  . BACK SURGERY  2004  . CARDIAC SURGERY  1997  . SHOULDER SURGERY  2005   Social History   Social History Narrative   Married, retired Naval architecttruck driver   1 son one daughter   2 caffeinated beverages daily     Family history is negative for problems.   Review of Systems As per HPI.  All other review of systems are negative.  Objective:   Physical Exam @BP  138/74   Pulse 66   Ht 5\' 4"  (1.626 m)   Wt 144 lb (65.3 kg)   BMI 24.72 kg/m @  General:  Well-developed, well-nourished and in no acute distress Eyes:  anicteric. Lungs: Clear to auscultation bilaterally. Heart:  S1S2, no rubs, murmurs, gallops. Abdomen:  soft, non-tender, no hepatosplenomegaly, hernia, or  mass and BS+.  Lymph:  no cervical or supraclavicular adenopathy. Extremities:   no cyanosis or clubbing Neuro:  A&O x 3.  Psych:  appropriate mood and  Affect.   Data Reviewed: Primary care notes from this year, labs from this year.  Endoscopy reports from 2010-2017 including colonoscopy.  See HPI.

## 2017-08-05 ENCOUNTER — Other Ambulatory Visit (INDEPENDENT_AMBULATORY_CARE_PROVIDER_SITE_OTHER): Payer: Medicare Other

## 2017-08-05 DIAGNOSIS — D508 Other iron deficiency anemias: Secondary | ICD-10-CM | POA: Diagnosis not present

## 2017-08-05 LAB — HEMOCCULT SLIDES (X 3 CARDS)
Fecal Occult Blood: NEGATIVE
OCCULT 1: NEGATIVE
OCCULT 2: NEGATIVE
OCCULT 3: NEGATIVE
OCCULT 4: NEGATIVE
OCCULT 5: NEGATIVE

## 2017-08-05 NOTE — Progress Notes (Signed)
Let him know no blood in stool  Only other thing I suggest is that we have him get a B12 level  We can do that or he can ask his PCP to check it

## 2017-08-06 ENCOUNTER — Other Ambulatory Visit: Payer: Self-pay

## 2017-08-06 DIAGNOSIS — E1122 Type 2 diabetes mellitus with diabetic chronic kidney disease: Secondary | ICD-10-CM

## 2017-08-06 MED ORDER — ONETOUCH ULTRASOFT LANCETS MISC: 12 refills | Status: DC

## 2017-08-07 ENCOUNTER — Other Ambulatory Visit: Payer: Self-pay | Admitting: Osteopathic Medicine

## 2017-08-07 DIAGNOSIS — D509 Iron deficiency anemia, unspecified: Secondary | ICD-10-CM

## 2017-08-07 NOTE — Progress Notes (Signed)
Pt was advised via [facility] that he should get his B12 levels checked due to his iron def anemia. Order placed.

## 2017-08-08 ENCOUNTER — Telehealth: Payer: Self-pay | Admitting: Osteopathic Medicine

## 2017-08-08 DIAGNOSIS — E1122 Type 2 diabetes mellitus with diabetic chronic kidney disease: Secondary | ICD-10-CM

## 2017-08-08 LAB — VITAMIN B12: Vitamin B-12: 479 pg/mL (ref 200–1100)

## 2017-08-08 MED ORDER — ONETOUCH ULTRASOFT LANCETS MISC
99 refills | Status: DC
Start: 2017-08-08 — End: 2017-11-06

## 2017-08-08 NOTE — Telephone Encounter (Signed)
Rx clarification lancets

## 2017-08-11 ENCOUNTER — Telehealth: Payer: Self-pay | Admitting: Osteopathic Medicine

## 2017-08-11 NOTE — Telephone Encounter (Signed)
Please call patient: can he confirm Cardura (doxazosin) dosing: 8 mg or 2 mg?    FYI to self: responding to [facility] request for clarification. We might have the Cardura/doxazosin incorrectly input into our system as 2 mg dose when pharmacy states he has only ever been on 8 mg dose. I think pharmacy can refill whatever he has been on and we can clarify with the patient at followup .

## 2017-08-11 NOTE — Telephone Encounter (Signed)
Left VM to see how Pt is taking Rx.

## 2017-08-12 ENCOUNTER — Telehealth: Payer: Self-pay | Admitting: Osteopathic Medicine

## 2017-08-12 NOTE — Telephone Encounter (Signed)
Called pt to verify medication--pt sated taking Rx Cardura 8 mg and doing ok with the medication.

## 2017-08-13 MED ORDER — DOXAZOSIN MESYLATE 8 MG PO TABS: 8.0000 mg | ORAL_TABLET | Freq: Every day | ORAL | 3 refills | Status: DC

## 2017-08-13 NOTE — Telephone Encounter (Signed)
Okay, medication list updated. Fax should have already been sent to mail order pharmacy. I think we just had this input incorrectly into the system from the beginning.

## 2017-08-13 NOTE — Addendum Note (Signed)
Addended by: Deirdre PippinsALEXANDER, Chad Tiznado M on: 08/13/2017 12:47 PM   Modules accepted: Orders

## 2017-08-13 NOTE — Telephone Encounter (Signed)
Spoke with Pt, he is taking 8mg  of Cardura. Pt states he has never taken 2mg .

## 2017-08-26 ENCOUNTER — Other Ambulatory Visit: Payer: Self-pay | Admitting: Osteopathic Medicine

## 2017-09-03 DIAGNOSIS — E119 Type 2 diabetes mellitus without complications: Secondary | ICD-10-CM | POA: Insufficient documentation

## 2017-09-03 DIAGNOSIS — E785 Hyperlipidemia, unspecified: Secondary | ICD-10-CM | POA: Insufficient documentation

## 2017-09-03 DIAGNOSIS — I482 Chronic atrial fibrillation, unspecified: Secondary | ICD-10-CM | POA: Insufficient documentation

## 2017-09-03 DIAGNOSIS — K219 Gastro-esophageal reflux disease without esophagitis: Secondary | ICD-10-CM | POA: Insufficient documentation

## 2017-09-03 DIAGNOSIS — I4891 Unspecified atrial fibrillation: Secondary | ICD-10-CM | POA: Insufficient documentation

## 2017-09-05 ENCOUNTER — Other Ambulatory Visit: Payer: Self-pay | Admitting: Osteopathic Medicine

## 2017-09-05 ENCOUNTER — Encounter: Payer: Self-pay | Admitting: Osteopathic Medicine

## 2017-09-05 ENCOUNTER — Ambulatory Visit (INDEPENDENT_AMBULATORY_CARE_PROVIDER_SITE_OTHER): Payer: Medicare Other | Admitting: Osteopathic Medicine

## 2017-09-05 VITALS — BP 135/78 | HR 73 | Wt 145.1 lb

## 2017-09-05 DIAGNOSIS — D508 Other iron deficiency anemias: Secondary | ICD-10-CM

## 2017-09-05 DIAGNOSIS — I4821 Permanent atrial fibrillation: Secondary | ICD-10-CM

## 2017-09-05 DIAGNOSIS — K219 Gastro-esophageal reflux disease without esophagitis: Secondary | ICD-10-CM

## 2017-09-05 DIAGNOSIS — I350 Nonrheumatic aortic (valve) stenosis: Secondary | ICD-10-CM

## 2017-09-05 DIAGNOSIS — E1122 Type 2 diabetes mellitus with diabetic chronic kidney disease: Secondary | ICD-10-CM

## 2017-09-05 DIAGNOSIS — Z7901 Long term (current) use of anticoagulants: Secondary | ICD-10-CM

## 2017-09-05 DIAGNOSIS — I482 Chronic atrial fibrillation: Secondary | ICD-10-CM | POA: Diagnosis not present

## 2017-09-05 LAB — POCT GLYCOSYLATED HEMOGLOBIN (HGB A1C): Hemoglobin A1C: 6.5

## 2017-09-05 NOTE — Patient Instructions (Signed)
To change your appointment with [facility]shaw [facility], please call [phone]  Will check blood level today for iron/anemia. If anemia is worse we may have to go ahead with colonoscopy.  Sugars are looking good, as long as you are not having any low blood sugars we can continue current medications. We also have the option to stop the glipizide.

## 2017-09-05 NOTE — Progress Notes (Signed)
HPI: Garrett Hughes is a 76 y.o. male  who [facility], 09/05/17,  for chief complaint of:  Chief Complaint  Patient presents with  . Diabetes    ENDOCRINE DM2 not on insulin but w/ CKD on list though on my review of the labs GFR is fine. He is taking glipizide but not noticing any drops in blood sugar. Tolerating metformin and glipizide well. Compliant with diabetic diet, exercising daily Glipizide 5 mg bid - doesn't want to come off this medicine Metformin 1000 mg bid - Normal B12 and GFR 05/06/17: 6.4% 09/05/17: 6.5%  CARDIOVASCULAR - following with Cardiologist Hx CABG/CAD: Currently no chest pain or claudication. AFib: History of GI bleed on warfarin, patient currently on Plavix alone, again he is following with cardiology and aware of stroke risk. He is on GI protection with PPI as noted below. Plavix 75 mg daily Metoprolol ER 25 daily Pravastatin 20 mg daily  GASTROINTESTINAL/HEMATOLOGIC Hx GERD w/ Barrett's Esophagus, states most recent EGD showed that this had resolved. History of GI bleed on anticoagulation, and he has been anemia. Recent GI visit he declined colonoscopy, he states he would get it done if absolutely needed  Protonix 20 mg daily      Past medical, surgical, social and family history reviewed: Patient Active Problem List   Diagnosis Date Noted  . Hyperlipidemia   . GERD (gastroesophageal reflux disease)   . Diabetes mellitus without complication (HCC)   . Atrial fibrillation (HCC)   . Aortic stenosis, mild 01/01/2017  . 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 Type 2 diabetes mellitus with kidney complication, without long-term current use of insulin (HCC) 07/03/2016  . Low back pain 05/02/2016  . Atherosclerosis of native coronary artery of native heart without angina pectoris 01/05/2016  . Benign essential hypertension 01/05/2016  . Diabetic kidney disease (HCC) 01/05/2016  . A-fib (HCC) 12/20/2015  . Current  use of long term anticoagulation 12/20/2015  . History of Barrett's esophagus 12/20/2015  . Hyperlipemia 10/26/2015  . Cataract, nuclear, right 05/06/2014   Past Surgical History:  Procedure Laterality Date  . BACK SURGERY  2004  . CARDIAC SURGERY  1997  . SHOULDER SURGERY  2005   Social History   Tobacco Use  . Smoking status: Former Games developermoker  . Smokeless tobacco: Never Used  Substance Use Topics  . Alcohol use: No   Family History  Problem Relation Age of Onset  . Colon cancer Neg Hx   . Heart attack Neg Hx      Current medication list and allergy/intolerance information reviewed:   Current Outpatient Medications  Medication Sig Dispense Refill  . clopidogrel (PLAVIX) 75 MG tablet Take 1 tablet (75 mg total) by mouth daily. 90 tablet 1  . doxazosin (CARDURA) 8 MG tablet Take 1 tablet (8 mg total) by mouth daily. 90 tablet 3  . ferrous sulfate 325 (65 FE) MG EC tablet Take 1 tablet (325 mg total) by mouth 2 (two) times daily with breakfast and lunch. Every other day 90 tablet 1  . glipiZIDE (GLUCOTROL) 5 MG tablet TAKE 1 TABLET BY MOUTH 2  TIMES DAILY BEFORE A MEAL. 180 tablet 0  . Lancets (ONETOUCH ULTRASOFT) lancets Use as instructed to check blood sugar up to qid. Dx: E11.22 100 each 99  . lisinopril (PRINIVIL,ZESTRIL) 5 MG tablet Take 1 tablet (5 mg total) by mouth daily. 90 tablet 1  . metFORMIN (GLUCOPHAGE) 1000 MG tablet TAKE 1 TABLET BY MOUTH  TWO  TIMES DAILY WITH A MEAL 180 tablet 0  . metoprolol succinate (TOPROL-XL) 25 MG 24 hr tablet Take 1 tablet (25 mg total) by mouth daily. 90 tablet 3  . pantoprazole (PROTONIX) 20 MG tablet Take 1 tablet (20 mg total) by mouth daily. 90 tablet 1  . pravastatin (PRAVACHOL) 40 MG tablet Take 1 tablet (40 mg total) by mouth daily. 90 tablet 0   No current facility-administered medications for this visit.    Allergies  Allergen Reactions  . Atorvastatin     Other reaction(s): Myalgias (intolerance)  . Cholestyramine Other (See  Comments)    Constipation      Review of Systems:  Constitutional:  No  fever, no chills, No recent illness, No unintentional weight changes. No significant fatigue.   HEENT: No  headache, no vision change, no hearing change, No sore throat, No  sinus pressure  Cardiac: No  chest pain, No  pressure, No palpitations, No  Orthopnea  Respiratory:  No  shortness of breath. No  Cough  Gastrointestinal: No  abdominal pain, No  nausea, No  vomiting,  No  blood in stool, No  diarrhea, No  constipation   Musculoskeletal: No new myalgia/arthralgia  Genitourinary: No  incontinence, No  abnormal genital bleeding, No abnormal genital discharge  Skin: No  Rash, No other wounds/concerning lesions  Hem/Onc: No  easy bruising/bleeding, No  abnormal lymph node  Endocrine: No cold intolerance,  No heat intolerance. No polyuria/polydipsia/polyphagia   Neurologic: No  weakness, No  dizziness, No  slurred speech/focal weakness/facial droop  Psychiatric: No  concerns with depression, No  concerns with anxiety, No sleep problems, No mood problems  Exam:  BP 135/78 (BP Location: Right Arm)   Pulse 73   Wt 145 lb 1.9 oz (65.8 kg)   BMI 24.91 kg/m   Constitutional: VS see above. General Appearance: alert, well-developed, well-nourished, NAD  Eyes: Normal lids and conjunctive, non-icteric sclera  Ears, Nose, Mouth, Throat: MMM, Normal external inspection ears/nares/mouth/lips/gums.   Neck: No masses, trachea midline.  Respiratory: Normal respiratory effort. no wheeze, no rhonchi, no rales  Cardiovascular: S1/S2 normal, +systolic murmur RSB, LSB, no rub/gallop auscultated. Irreg/Irreg. No lower extremity edema.   Musculoskeletal: Gait normal.   Neurological: Normal balance/coordination. No tremor.   Psychiatric: Normal judgment/insight. Normal mood and affect. Oriented x3.    Results for orders placed or performed in visit on 09/05/17 (from the past 72 hour(s))  POCT HgB A1C     Status:  None   Collection Time: 09/05/17  9:43 AM  Result Value Ref Range   Hemoglobin A1C 6.5      ASSESSMENT/PLAN: The primary encounter diagnosis was Type 2 diabetes mellitus with chronic kidney disease, without long-term current use of insulin, unspecified CKD stage (HCC). Diagnoses of Other iron deficiency anemia, Current use of long term anticoagulation, Permanent atrial fibrillation (HCC), Aortic stenosis, mild, and Gastroesophageal reflux disease without esophagitis were also pertinent to this visit.  Orders Placed This Encounter  Procedures  . CBC  . Fe+TIBC+Fer  . POCT HgB A1C   Chronic medical problems appear to be fairly well controlled. Could consider possibly even discontinuing glipizide, need to follow labs for anemia      Visit summary with medication list and pertinent instructions was printed for patient to review. All questions at time of visit were answered - patient instructed to contact office with any additional concerns. ER/RTC precautions were reviewed with the patient.   Follow-up plan: Return in about 4  months (around 01/04/2018) for recheck sugars and labs .  Note: Total time spent 25 minutes, greater than 50% of the visit was spent face-to-face counseling and coordinating care for the following: The primary encounter diagnosis was Type 2 diabetes mellitus with chronic kidney disease, without long-term current use of insulin, unspecified CKD stage (HCC). Diagnoses of Other iron deficiency anemia, Current use of long term anticoagulation, Permanent atrial fibrillation (HCC), Aortic stenosis, mild, and Gastroesophageal reflux disease without esophagitis were also pertinent to this visit..Marland Kitchen

## 2017-09-06 LAB — CBC
HEMATOCRIT: 33.1 % — AB (ref 38.5–50.0)
Hemoglobin: 10.6 g/dL — ABNORMAL LOW (ref 13.2–17.1)
MCH: 27.5 pg (ref 27.0–33.0)
MCHC: 32 g/dL (ref 32.0–36.0)
MCV: 86 fL (ref 80.0–100.0)
MPV: 11.8 fL (ref 7.5–12.5)
Platelets: 163 10*3/uL (ref 140–400)
RBC: 3.85 10*6/uL — AB (ref 4.20–5.80)
RDW: 15.1 % — AB (ref 11.0–15.0)
WBC: 6.2 10*3/uL (ref 3.8–10.8)

## 2017-09-06 LAB — IRON,TIBC AND FERRITIN PANEL
%SAT: 12 % (calc) — ABNORMAL LOW (ref 15–60)
FERRITIN: 15 ng/mL — AB (ref 20–380)
IRON: 59 ug/dL (ref 50–180)
TIBC: 497 mcg/dL (calc) — ABNORMAL HIGH (ref 250–425)

## 2017-09-08 ENCOUNTER — Other Ambulatory Visit: Payer: Self-pay

## 2017-09-08 DIAGNOSIS — D509 Iron deficiency anemia, unspecified: Secondary | ICD-10-CM

## 2017-09-08 MED ORDER — FERROUS SULFATE 325 (65 FE) MG PO TBEC: 325.0000 mg | DELAYED_RELEASE_TABLET | Freq: Two times a day (BID) | ORAL | 0 refills | Status: DC

## 2017-09-22 ENCOUNTER — Ambulatory Visit: Payer: Medicare Other | Admitting: Cardiology

## 2017-09-26 ENCOUNTER — Other Ambulatory Visit: Payer: Self-pay | Admitting: Osteopathic Medicine

## 2017-10-07 NOTE — Progress Notes (Signed)
Referring-Natalie Alexander DO Reason for referral-CAD s/p CABG  HPI: 77 yo male for evaluation of CAD (s/p CABG 1997) and atrial fibrillation at request of Sunnie Nielsenatalie Alexander DO. Pt previously followed on [place]. Echo 11/17 showed normal LV function and mild AS (full report not available). Not on anticoagulation as pt refused in past and had h/o GI bleed and urinary tract bleeding.  Patient denies dyspnea, chest pain, palpitations or syncope.  No pedal edema.  Current Outpatient Medications  Medication Sig Dispense Refill  . clopidogrel (PLAVIX) 75 MG tablet Take 1 tablet (75 mg total) by mouth daily. 90 tablet 1  . doxazosin (CARDURA) 8 MG tablet Take 1 tablet (8 mg total) by mouth daily. 90 tablet 3  . ferrous sulfate 325 (65 FE) MG EC tablet Take 1 tablet (325 mg total) by mouth 2 (two) times daily with breakfast and lunch. Every other day 180 tablet 0  . glipiZIDE (GLUCOTROL) 5 MG tablet TAKE 1 TABLET BY MOUTH 2  TIMES DAILY BEFORE A MEAL. 180 tablet 0  . Lancets (ONETOUCH ULTRASOFT) lancets Use as instructed to check blood sugar up to qid. Dx: E11.22 100 each 99  . lisinopril (PRINIVIL,ZESTRIL) 5 MG tablet Take 1 tablet (5 mg total) by mouth daily. 90 tablet 1  . metFORMIN (GLUCOPHAGE) 1000 MG tablet TAKE 1 TABLET BY MOUTH TWO  TIMES DAILY WITH A MEAL 180 tablet 0  . metoprolol succinate (TOPROL-XL) 25 MG 24 hr tablet Take 1 tablet (25 mg total) by mouth daily. 90 tablet 3  . pantoprazole (PROTONIX) 20 MG tablet TAKE 1 TABLET BY MOUTH  DAILY 90 tablet 1  . pravastatin (PRAVACHOL) 40 MG tablet TAKE 1 TABLET BY MOUTH  DAILY 90 tablet 0   No current facility-administered medications for this visit.     Allergies  Allergen Reactions  . Atorvastatin     Other reaction(s): Myalgias (intolerance)  . Cholestyramine Other (See Comments)    Constipation  . Pravastatin Other (See Comments)     Past Medical History:  Diagnosis Date  . A-fib (HCC) 12/20/2015  . Aortic stenosis,  mild 01/01/2017  . Atherosclerosis of native coronary artery of native heart without angina pectoris 01/05/2016  . Benign essential hypertension 01/05/2016  . Cataract, nuclear, right 05/06/2014  . Current use of long term anticoagulation 12/20/2015  . Diabetic kidney disease (HCC) 01/05/2016  . GERD (gastroesophageal reflux disease)   . History of Barrett's esophagus 12/20/2015  . 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 Hyperlipemia 10/26/2015  . Low back pain 05/02/2016  . Type 2 diabetes mellitus with kidney complication, without long-term current use of insulin (HCC) 07/03/2016    Past Surgical History:  Procedure Laterality Date  . BACK SURGERY  2004  . CARDIAC SURGERY  1997  . SHOULDER SURGERY  2005    Social History   Socioeconomic History  . Marital status: Married    Spouse name: Not on file  . Number of children: 2  . Years of education: Not on file  . Highest education level: Not on file  Social Needs  . Financial resource strain: Not on file  . Food insecurity - worry: Not on file  . Food insecurity - inability: Not on file  . Transportation needs - medical: Not on file  . Transportation needs - non-medical: Not on file  Occupational History  . Not on file  Tobacco Use  . Smoking  status: Former Smoker    Last attempt to quit: 09/10/1987    Years since quitting: 30.1  . Smokeless tobacco: Never Used  Substance and Sexual Activity  . Alcohol use: No  . Drug use: No  . Sexual activity: Not on file  Other Topics Concern  . Not on file  Social History Narrative   Married, retired Naval architect   1 son one daughter   2 caffeinated beverages daily    Family History  Problem Relation Age of Onset  . CAD Father   . Colon cancer Neg Hx   . Heart attack Neg Hx     ROS: no fevers or chills, productive cough, hemoptysis, dysphasia, odynophagia, melena, hematochezia, dysuria, hematuria, rash, seizure activity, orthopnea, PND, pedal  edema, claudication. Remaining systems are negative.  Physical Exam:   Blood pressure 137/65, pulse 84, height 5\' 4"  (1.626 m), weight 147 lb (66.7 kg).  General:  Well developed/well nourished in NAD Skin warm/dry Patient not depressed No peripheral clubbing Back-normal HEENT-normal/normal eyelids Neck supple/normal carotid upstroke bilaterally; no bruits; no JVD; no thyromegaly chest - CTA/ normal expansion CV - irregular/normal S1 and S2; no rubs or gallops;  PMI nondisplaced, 3/6 systolic murmur left sternal border.  S2 is not diminished. Abdomen -NT/ND, no HSM, no mass, + bowel sounds, positive bruit 2+ femoral pulses, no bruits Ext-no edema, chords, 2+ DP Neuro-grossly nonfocal  ECG -atrial fibrillation at a rate of 84.  Right bundle branch block.  Personally reviewed  A/P  1 coronary artery disease status post coronary artery bypass and graft-patient is not having chest pain.  Plan medical therapy.  Continue Plavix and statin.  2 Permanent atrial fibrillation-patient is in permanent atrial fibrillation.  Continue Toprol for rate control. CHADSvasc 5.  I discussed the need for anticoagulation today.  I explained the risk of embolic CVA.  However he had hematuria with Coumadin in the past in assessment which is understandable.  I have provided the names of apixaban and Xarelto.  He will contact us if he is willing to take these in the future.  We would need to follow closely for bleeding.  3 aortic stenosis-plan repeat echocardiogram.  Patient with loud murmur on examination.  4 bruit-schedule abdominal ultrasound to exclude aneurysm.  5 hypertension-blood pressure is controlled.  Continue present medications.  6 hyperlipidemia-continue statin.  He did not tolerate Lipitor or higher doses of Pravachol in the past.   Olga Millers, MD

## 2017-10-15 ENCOUNTER — Encounter: Payer: Self-pay | Admitting: Cardiology

## 2017-10-15 ENCOUNTER — Other Ambulatory Visit: Payer: Self-pay | Admitting: Cardiology

## 2017-10-15 ENCOUNTER — Ambulatory Visit (INDEPENDENT_AMBULATORY_CARE_PROVIDER_SITE_OTHER): Payer: Medicare HMO | Admitting: Cardiology

## 2017-10-15 VITALS — BP 137/65 | HR 84 | Ht 64.0 in | Wt 147.0 lb

## 2017-10-15 DIAGNOSIS — I350 Nonrheumatic aortic (valve) stenosis: Secondary | ICD-10-CM | POA: Diagnosis not present

## 2017-10-15 DIAGNOSIS — I25810 Atherosclerosis of coronary artery bypass graft(s) without angina pectoris: Secondary | ICD-10-CM | POA: Diagnosis not present

## 2017-10-15 DIAGNOSIS — R0989 Other specified symptoms and signs involving the circulatory and respiratory systems: Secondary | ICD-10-CM | POA: Diagnosis not present

## 2017-10-15 DIAGNOSIS — E7800 Pure hypercholesterolemia, unspecified: Secondary | ICD-10-CM | POA: Diagnosis not present

## 2017-10-15 DIAGNOSIS — I10 Essential (primary) hypertension: Secondary | ICD-10-CM | POA: Diagnosis not present

## 2017-10-15 NOTE — Patient Instructions (Signed)
Medication Instructions:   NO CHANGE  Testing/Procedures:  Your physician has requested that you have an echocardiogram. Echocardiography is a painless test that uses sound waves to create images of your heart. It provides your doctor with information about the size and shape of your heart and how well your heart's chambers and valves are working. This procedure takes approximately one hour. There are no restrictions for this procedure.   Your physician has requested that you have an abdominal aorta duplex. During this test, an ultrasound is used to evaluate the aorta. Allow 30 minutes for this exam. Do not eat after midnight the day before and avoid carbonated beverages   Follow-Up:  Your physician wants you to follow-up in: 6 MONTHS WITH DR CRENSHAW You will receive a reminder letter in the mail two months in advance. If you don't receive a letter, please call our office to schedule the follow-up appointment.   If you need a refill on your cardiac medications before your next appointment, please call your pharmacy.    

## 2017-10-15 NOTE — Addendum Note (Signed)
Addended by: Freddi StarrMATHIS, Kamree Wiens W on: 10/15/2017 04:58 PM   Modules accepted: Orders

## 2017-11-05 ENCOUNTER — Ambulatory Visit (HOSPITAL_BASED_OUTPATIENT_CLINIC_OR_DEPARTMENT_OTHER)
Admission: RE | Admit: 2017-11-05 | Discharge: 2017-11-05 | Disposition: A | Discharge status: Home / Self Care | Payer: Medicare HMO | Source: Ambulatory Visit | Attending: Cardiology | Admitting: Cardiology

## 2017-11-05 DIAGNOSIS — I350 Nonrheumatic aortic (valve) stenosis: Secondary | ICD-10-CM | POA: Diagnosis not present

## 2017-11-05 DIAGNOSIS — R0989 Other specified symptoms and signs involving the circulatory and respiratory systems: Secondary | ICD-10-CM | POA: Insufficient documentation

## 2017-11-05 DIAGNOSIS — I4891 Unspecified atrial fibrillation: Secondary | ICD-10-CM | POA: Insufficient documentation

## 2017-11-05 DIAGNOSIS — I083 Combined rheumatic disorders of mitral, aortic and tricuspid valves: Secondary | ICD-10-CM | POA: Insufficient documentation

## 2017-11-05 DIAGNOSIS — E785 Hyperlipidemia, unspecified: Secondary | ICD-10-CM | POA: Diagnosis not present

## 2017-11-05 NOTE — Progress Notes (Signed)
2D Echocardiogram has been performed.  Dorothey BasemanReel, Tyjanae Bartek M 11/05/2017, 8:57 AM

## 2017-11-06 ENCOUNTER — Encounter: Payer: Self-pay | Admitting: Cardiology

## 2017-11-06 ENCOUNTER — Other Ambulatory Visit: Payer: Self-pay

## 2017-11-06 DIAGNOSIS — D509 Iron deficiency anemia, unspecified: Secondary | ICD-10-CM

## 2017-11-06 DIAGNOSIS — E1122 Type 2 diabetes mellitus with diabetic chronic kidney disease: Secondary | ICD-10-CM

## 2017-11-06 MED ORDER — PRAVASTATIN SODIUM 40 MG PO TABS: 40.0000 mg | ORAL_TABLET | Freq: Every day | ORAL | 0 refills | Status: DC

## 2017-11-06 MED ORDER — DOXAZOSIN MESYLATE 8 MG PO TABS: 8.0000 mg | ORAL_TABLET | Freq: Every day | ORAL | 0 refills | Status: DC

## 2017-11-06 MED ORDER — LISINOPRIL 5 MG PO TABS: 5.0000 mg | ORAL_TABLET | Freq: Every day | ORAL | 0 refills | Status: DC

## 2017-11-06 MED ORDER — METOPROLOL SUCCINATE ER 25 MG PO TB24: 25.0000 mg | ORAL_TABLET | Freq: Every day | ORAL | 0 refills | Status: DC

## 2017-11-06 MED ORDER — METFORMIN HCL 1000 MG PO TABS: ORAL_TABLET | ORAL | 0 refills | Status: DC

## 2017-11-06 MED ORDER — CLOPIDOGREL BISULFATE 75 MG PO TABS: 75.0000 mg | ORAL_TABLET | Freq: Every day | ORAL | 0 refills | Status: DC

## 2017-11-06 MED ORDER — ONETOUCH ULTRASOFT LANCETS MISC: 99 refills | Status: DC

## 2017-11-06 MED ORDER — GLIPIZIDE 5 MG PO TABS: ORAL_TABLET | ORAL | 0 refills | Status: DC

## 2017-11-06 MED ORDER — FERROUS SULFATE 325 (65 FE) MG PO TBEC: 325.0000 mg | DELAYED_RELEASE_TABLET | Freq: Two times a day (BID) | ORAL | 0 refills | Status: DC

## 2017-11-06 MED ORDER — PANTOPRAZOLE SODIUM 20 MG PO TBEC: 20.0000 mg | DELAYED_RELEASE_TABLET | Freq: Every day | ORAL | 0 refills | Status: DC

## 2017-11-06 NOTE — Telephone Encounter (Signed)
PT needs refills on his medications (prescribed by Dr. Lyn HollingsheadAlexander) [facility]card on file) instead of [facility]

## 2017-11-06 NOTE — Telephone Encounter (Signed)
This encounter was created in error - please disregard.

## 2017-11-06 NOTE — Addendum Note (Signed)
Addended by: Collie SiadICHARDSON, Charls Custer M on: 11/06/2017 02:43 PM   Modules accepted: Orders

## 2017-11-06 NOTE — Telephone Encounter (Signed)
Spoke with Pt, he needs refills on everything. Will send one 90 day supply. Reminded him he is due for follow up in April. All refills sent.

## 2017-11-06 NOTE — Telephone Encounter (Signed)
Follow Up:    Returning your call, concerning his test results. 

## 2017-11-10 NOTE — Telephone Encounter (Signed)
Pt left a vm msg requesting all medications refills be sent to [facility].

## 2017-12-06 ENCOUNTER — Other Ambulatory Visit: Payer: Self-pay

## 2017-12-06 ENCOUNTER — Encounter: Payer: Self-pay | Admitting: Emergency Medicine

## 2017-12-06 ENCOUNTER — Emergency Department (INDEPENDENT_AMBULATORY_CARE_PROVIDER_SITE_OTHER): Payer: Medicare HMO

## 2017-12-06 ENCOUNTER — Emergency Department
Admission: EM | Admit: 2017-12-06 | Discharge: 2017-12-06 | Disposition: A | Discharge status: Home / Self Care | Payer: Medicare HMO | Source: Home / Self Care

## 2017-12-06 DIAGNOSIS — R05 Cough: Secondary | ICD-10-CM

## 2017-12-06 DIAGNOSIS — J40 Bronchitis, not specified as acute or chronic: Secondary | ICD-10-CM | POA: Diagnosis not present

## 2017-12-06 DIAGNOSIS — I517 Cardiomegaly: Secondary | ICD-10-CM | POA: Diagnosis not present

## 2017-12-06 MED ORDER — ALBUTEROL SULFATE HFA 108 (90 BASE) MCG/ACT IN AERS: 1.0000 | INHALATION_SPRAY | Freq: Four times a day (QID) | RESPIRATORY_TRACT | 0 refills | Status: DC | PRN

## 2017-12-06 MED ORDER — AZITHROMYCIN 250 MG PO TABS: 250.0000 mg | ORAL_TABLET | Freq: Every day | ORAL | 0 refills | Status: DC

## 2017-12-06 MED ORDER — BENZONATATE 100 MG PO CAPS: 100.0000 mg | ORAL_CAPSULE | Freq: Three times a day (TID) | ORAL | 0 refills | Status: DC

## 2017-12-06 NOTE — ED Triage Notes (Signed)
Reports cough and congestion with chest/rib pain upon coughing for past 4 days.

## 2017-12-06 NOTE — Discharge Instructions (Signed)
See your Physician for recheck this week.   °

## 2017-12-08 NOTE — ED Provider Notes (Signed)
[facility]    CSN: [id number] Arrival date & time: 12/06/17  1527     History   Chief Complaint Chief Complaint  Patient presents with  . Cough    HPI Garrett ChristmasBarry P Leavy is a 77 y.o. male.   The history is provided by the patient. No language interpreter was used.  Cough  Cough characteristics:  Productive Sputum characteristics:  Nondescript Severity:  Moderate Onset quality:  Gradual Duration:  4 days Timing:  Constant Progression:  Worsening Chronicity:  New Context: with activity   Relieved by:  Nothing Worsened by:  Nothing Ineffective treatments:  None tried Associated symptoms: shortness of breath   Pt complains of pain in his ribs and chest with coughing.   Past Medical History:  Diagnosis Date  . A-fib (HCC) 12/20/2015  . Aortic stenosis, mild 01/01/2017  . Atherosclerosis of native coronary artery of native heart without angina pectoris 01/05/2016  . Benign essential hypertension 01/05/2016  . Cataract, nuclear, right 05/06/2014  . Current use of long term anticoagulation 12/20/2015  . Diabetic kidney disease (HCC) 01/05/2016  . GERD (gastroesophageal reflux disease)   . History of Barrett's esophagus 12/20/2015  . 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 Hyperlipemia 10/26/2015  . Low back pain 05/02/2016  . Type 2 diabetes mellitus with kidney complication, without long-term current use of insulin (HCC) 07/03/2016    Patient Active Problem List   Diagnosis Date Noted  . Hyperlipidemia   . GERD (gastroesophageal reflux disease)   . Diabetes mellitus without complication (HCC)   . Atrial fibrillation (HCC)   . Aortic stenosis, mild 01/01/2017  . 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 Type 2 diabetes mellitus with kidney complication, without long-term current use of insulin (HCC) 07/03/2016  . Low back pain 05/02/2016  . Atherosclerosis of native coronary artery of native heart without angina pectoris 01/05/2016  . Benign  essential hypertension 01/05/2016  . Diabetic kidney disease (HCC) 01/05/2016  . A-fib (HCC) 12/20/2015  . Current use of long term anticoagulation 12/20/2015  . History of Barrett's esophagus 12/20/2015  . Hyperlipemia 10/26/2015  . Cataract, nuclear, right 05/06/2014    Past Surgical History:  Procedure Laterality Date  . BACK SURGERY  2004  . CARDIAC SURGERY  1997  . SHOULDER SURGERY  2005       Home Medications    Prior to Admission medications   Medication Sig Start Date End Date Taking? Authorizing Provider  albuterol (PROVENTIL HFA;VENTOLIN HFA) 108 (90 Base) MCG/ACT inhaler Inhale 1-2 puffs into the lungs every 6 (six) hours as needed for wheezing or shortness of breath. 12/06/17   Elson AreasSofia, Khaniya Tenaglia K, PA-C  azithromycin (ZITHROMAX) 250 MG tablet Take 1 tablet (250 mg total) by mouth daily. Take first 2 tablets together, then 1 every day until finished. 12/06/17   Elson AreasSofia, Kymere Fullington K, PA-C  benzonatate (TESSALON) 100 MG capsule Take 1 capsule (100 mg total) by mouth every 8 (eight) hours. 12/06/17   Elson AreasSofia, Josph Norfleet K, PA-C  clopidogrel (PLAVIX) 75 MG tablet Take 1 tablet (75 mg total) by mouth daily. 11/06/17   Sunnie NielsenAlexander, Natalie, DO  doxazosin (CARDURA) 8 MG tablet Take 1 tablet (8 mg total) by mouth daily. 11/06/17   Sunnie NielsenAlexander, Natalie, DO  ferrous sulfate 325 (65 FE) MG EC tablet Take 1 tablet (325 mg total) by mouth 2 (two) times daily with breakfast and lunch. Every other day 11/06/17   Sunnie NielsenAlexander, Natalie,  DO  glipiZIDE (GLUCOTROL) 5 MG tablet TAKE 1 TABLET BY MOUTH 2  TIMES DAILY BEFORE A MEAL. 11/06/17   Sunnie Nielsen, DO  Lancets [facility] ULTRASOFT) lancets Use as instructed to check blood sugar up to qid. Dx: E11.22 11/06/17   Sunnie Nielsen, DO  lisinopril (PRINIVIL,ZESTRIL) 5 MG tablet Take 1 tablet (5 mg total) by mouth daily. 11/06/17   Sunnie Nielsen, DO  metFORMIN (GLUCOPHAGE) 1000 MG tablet TAKE 1 TABLET BY MOUTH TWO  TIMES DAILY WITH A MEAL 11/06/17   Sunnie Nielsen, DO  metoprolol succinate (TOPROL-XL) 25 MG 24 hr tablet Take 1 tablet (25 mg total) by mouth daily. 11/06/17   Sunnie Nielsen, DO  pantoprazole (PROTONIX) 20 MG tablet Take 1 tablet (20 mg total) by mouth daily. 11/06/17   Sunnie Nielsen, DO  pravastatin (PRAVACHOL) 40 MG tablet Take 1 tablet (40 mg total) by mouth daily. 11/06/17   Sunnie Nielsen, DO    Family History Family History  Problem Relation Age of Onset  . CAD Father   . Colon cancer Neg Hx   . Heart attack Neg Hx     Social History Social History   Tobacco Use  . Smoking status: Former Smoker    Last attempt to quit: 09/10/1987    Years since quitting: 30.2  . Smokeless tobacco: Never Used  Substance Use Topics  . Alcohol use: No  . Drug use: No     Allergies   Atorvastatin; Cholestyramine; and Pravastatin   Review of Systems Review of Systems  Respiratory: Positive for cough and shortness of breath.   All other systems reviewed and are negative.    Physical Exam Triage Vital Signs ED Triage Vitals  Enc Vitals Group     BP 12/06/17 1603 (!) 110/49     Pulse Rate 12/06/17 1603 86     Resp 12/06/17 1603 20     Temp 12/06/17 1603 99.2 F (37.3 C)     Temp Source 12/06/17 1603 Oral     SpO2 12/06/17 1603 94 %     Weight 12/06/17 1604 145 lb (65.8 kg)     Height 12/06/17 1604 5\' 4"  (1.626 m)     Head Circumference --      Peak Flow --      Pain Score 12/06/17 1604 2     Pain Loc --      Pain Edu? --      Excl. in GC? --    No data found.  Updated Vital Signs BP (!) 110/49 (BP Location: Right Arm)   Pulse 86   Temp 99.2 F (37.3 C) (Oral)   Resp 20   Ht 5\' 4"  (1.626 m)   Wt 145 lb (65.8 kg)   SpO2 94%   BMI 24.89 kg/m   Visual Acuity Right Eye Distance:   Left Eye Distance:   Bilateral Distance:    Right Eye Near:   Left Eye Near:    Bilateral Near:     Physical Exam  Constitutional: He appears well-developed and well-nourished.  HENT:  Head: Normocephalic and  atraumatic.  Right Ear: External ear normal.  Left Ear: External ear normal.  Mouth/Throat: Oropharynx is clear and moist.  Eyes: Conjunctivae are normal.  Neck: Neck supple.  Cardiovascular: Normal rate and regular rhythm.  No murmur heard. Pulmonary/Chest: Effort normal and breath sounds normal. No respiratory distress.  Abdominal: Soft. There is no tenderness.  Musculoskeletal: Normal range of motion. He exhibits no edema.  Neurological: He  is alert.  Skin: Skin is warm and dry.  Psychiatric: He has a normal mood and affect.  Nursing note and vitals reviewed.    UC Treatments / Results  Labs (all labs ordered are listed, but only abnormal results are displayed) Labs Reviewed - No data to display  EKG None Radiology No results found.  Procedures Procedures (including critical care time)  Medications Ordered in UC Medications - No data to display   Initial Impression / Assessment and Plan / UC Course  I have reviewed the triage vital signs and the nursing notes.  Pertinent labs & imaging results that were available during my care of the patient were reviewed by me and considered in my medical decision making (see chart for details).     MDM Pt reports the last time he had this he was treated with zithromax, albuterol and tessalon.  He is requesting same.   Final Clinical Impressions(s) / UC Diagnoses   Final diagnoses:  Bronchitis    ED Discharge Orders        Ordered    azithromycin (ZITHROMAX) 250 MG tablet  Daily     12/06/17 1650    benzonatate (TESSALON) 100 MG capsule  Every 8 hours     12/06/17 1650    albuterol (PROVENTIL HFA;VENTOLIN HFA) 108 (90 Base) MCG/ACT inhaler  Every 6 hours PRN     12/06/17 1657       Controlled Substance Prescriptions [place] Controlled Substance Registry consulted? Not Applicable   Elson Areas, [place] 12/08/17 2147

## 2018-01-28 ENCOUNTER — Other Ambulatory Visit: Payer: Self-pay | Admitting: Osteopathic Medicine

## 2018-02-03 ENCOUNTER — Other Ambulatory Visit: Payer: Self-pay

## 2018-02-03 MED ORDER — DOXAZOSIN MESYLATE 8 MG PO TABS: 8.0000 mg | ORAL_TABLET | Freq: Every day | ORAL | 0 refills | Status: DC

## 2018-02-05 DIAGNOSIS — E119 Type 2 diabetes mellitus without complications: Secondary | ICD-10-CM | POA: Diagnosis not present

## 2018-02-05 DIAGNOSIS — H52223 Regular astigmatism, bilateral: Secondary | ICD-10-CM | POA: Diagnosis not present

## 2018-05-29 ENCOUNTER — Other Ambulatory Visit: Payer: Self-pay | Admitting: Osteopathic Medicine

## 2018-06-01 ENCOUNTER — Telehealth: Payer: Self-pay

## 2018-06-01 DIAGNOSIS — E1069 Type 1 diabetes mellitus with other specified complication: Secondary | ICD-10-CM

## 2018-06-01 DIAGNOSIS — E1122 Type 2 diabetes mellitus with diabetic chronic kidney disease: Secondary | ICD-10-CM

## 2018-06-01 NOTE — Telephone Encounter (Signed)
[facility] requesting rxs for accu-chek aviva plus meter, accu-check aviva plus test strips & accu-chek softclix lancets. New - not listed in pt's active med list. Thanks.

## 2018-06-01 NOTE — Telephone Encounter (Signed)
OK to print Rx and I willl sign.

## 2018-06-02 ENCOUNTER — Other Ambulatory Visit: Payer: Self-pay

## 2018-06-02 ENCOUNTER — Encounter: Payer: Self-pay | Admitting: Cardiology

## 2018-06-02 MED ORDER — ALCOHOL SWABS PADS: MEDICATED_PAD | 1 refills | Status: DC

## 2018-06-02 MED ORDER — ACCU-CHEK SOFTCLIX LANCETS MISC: 12 refills | Status: DC

## 2018-06-02 MED ORDER — GLUCOSE BLOOD VI STRP: ORAL_STRIP | 12 refills | Status: DC

## 2018-06-02 MED ORDER — ACCU-CHEK AVIVA VI SOLN: 0 refills | Status: DC

## 2018-06-02 MED ORDER — ACCU-CHEK AVIVA PLUS W/DEVICE KIT: PACK | 0 refills | Status: DC

## 2018-06-02 NOTE — Telephone Encounter (Signed)
Unable to print rxs - [facility] (even though I selected print feature on all rxs). Previous sig/instructions was noted on new meter/test strips/lancets along w/dx code.

## 2018-06-03 MED ORDER — AMBULATORY NON FORMULARY MEDICATION: 99 refills | Status: DC

## 2018-06-03 NOTE — Telephone Encounter (Signed)
Rx faxed to [facility] pharmacy. Confirmation rec'd.

## 2018-06-03 NOTE — Telephone Encounter (Signed)
rx printed

## 2018-06-11 NOTE — Progress Notes (Signed)
HPI: FU CAD (s/p CABG 1997) and atrial fibrillation. Not on anticoagulation as pt refused in past and had h/o GI bleed and urinary tract bleeding. Echocardiogram February 2019 showed normal LV function, moderate aortic stenosis with mean gradient 28 mmHg, mild aortic insufficiency, mild mitral regurgitation, severe left atrial enlargement, moderate right ventricular enlargement with reduced function.  Abdominal ultrasound February 2019 showed no aneurysm.  Since last seen, the patient has dyspnea with more extreme activities but not with routine activities. It is relieved with rest. It is not associated with chest pain. There is no orthopnea, PND or pedal edema. There is no syncope or palpitations. There is no exertional chest pain.   Current Outpatient Medications  Medication Sig Dispense Refill  . ACCU-CHEK SOFTCLIX LANCETS lancets Use as instructed to check blood sugar up to qid. Dx Code E11.22. 100 each 12  . albuterol (PROVENTIL HFA;VENTOLIN HFA) 108 (90 Base) MCG/ACT inhaler Inhale 1-2 puffs into the lungs every 6 (six) hours as needed for wheezing or shortness of breath. 1 Inhaler 0  . Alcohol Swabs PADS Use as instructed to check blood sugar up to qid. Dx code E11.22 100 each 1  . AMBULATORY NON FORMULARY MEDICATION Medication Name: accu-chek aviva plus meter, accu-check aviva plus test strips & accu-chek softclix lancets to test twice a day. Dx: E11.29 1 vial PRN  . Blood Glucose Calibration (ACCU-CHEK AVIVA) SOLN Use as instructed per manufacturer directions. Dx code E11.22 1 each 0  . Blood Glucose Monitoring Suppl (ACCU-CHEK AVIVA PLUS) w/Device KIT Use as instructed to check blood sugar up to qid. Dx code E11.22 1 kit 0  . clopidogrel (PLAVIX) 75 MG tablet Take 1 tablet (75 mg total) by mouth daily. Pt needs F/U appt w/PCP for further RFs. 90 tablet 0  . doxazosin (CARDURA) 8 MG tablet TAKE 1 TABLET (8 MG TOTAL) BY MOUTH DAILY. PT NEEDS A F/U APPT W/PCP FOR REFILLS. 90 tablet 0  .  ferrous sulfate 325 (65 FE) MG EC tablet Take 1 tablet (325 mg total) by mouth 2 (two) times daily with breakfast and lunch. Every other day 180 tablet 0  . glipiZIDE (GLUCOTROL) 5 MG tablet TAKE 1 TABLET TWICE DAILY BEFORE MEALS. Pt needs F/U appt w/PCP for RFs. 180 tablet 0  . glucose blood (ACCU-CHEK AVIVA PLUS) test strip Use as instructed to check blood sugar up to qid. Dx Code E11.22. 100 each 12  . Lancets (ONETOUCH ULTRASOFT) lancets Use as instructed to check blood sugar up to qid. Dx: E11.22 100 each 99  . lisinopril (PRINIVIL,ZESTRIL) 5 MG tablet Take 1 tablet (5 mg total) by mouth daily. Pt needs F/U appt w/PCP for further refills. 90 tablet 0  . metFORMIN (GLUCOPHAGE) 1000 MG tablet TAKE 1 TABLET TWICE DAILY WITH A MEAL. Pt needs F/U appt w/PCP for RFs. 180 tablet 0  . metoprolol succinate (TOPROL-XL) 25 MG 24 hr tablet Take 1 tablet (25 mg total) by mouth daily. Pt needs F/U appt w/PCP for further refills. 90 tablet 0  . pantoprazole (PROTONIX) 20 MG tablet Take 1 tablet (20 mg total) by mouth daily. Pt needs F/U appt w/PCP for further refills. 90 tablet 0  . pravastatin (PRAVACHOL) 40 MG tablet Take 1 tablet (40 mg total) by mouth daily. Pt needs F/U appt w/PCP for further refills. 90 tablet 0   No current facility-administered medications for this visit.      Past Medical History:  Diagnosis Date  . A-fib ([place]) 12/20/2015  .  Aortic stenosis, mild 01/01/2017  . Atherosclerosis of native coronary artery of native heart without angina pectoris 01/05/2016  . Benign essential hypertension 01/05/2016  . Cataract, nuclear, right 05/06/2014  . Current use of long term anticoagulation 12/20/2015  . Diabetic kidney disease ([place]) 01/05/2016  . GERD (gastroesophageal reflux disease)   . History of Barrett's esophagus 12/20/2015  . 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 Hyperlipemia 10/26/2015  . Low back pain 05/02/2016  . Type 2 diabetes mellitus with  kidney complication, without long-term current use of insulin ([place]) 07/03/2016    Past Surgical History:  Procedure Laterality Date  . BACK SURGERY  2004  . [place]  . SHOULDER SURGERY  2005    Social History   Socioeconomic History  . Marital status: Married    Spouse name: Not on file  . Number of children: 2  . Years of education: Not on file  . Highest education level: Not on file  Occupational History  . Not on file  Social Needs  . Financial resource strain: Not on file  . Food insecurity:    Worry: Not on file    Inability: Not on file  . Transportation needs:    Medical: Not on file    Non-medical: Not on file  Tobacco Use  . Smoking status: Former Smoker    Last attempt to quit: 09/10/1987    Years since quitting: 30.7  . Smokeless tobacco: Never Used  Substance and Sexual Activity  . Alcohol use: No  . Drug use: No  . Sexual activity: Not on file  Lifestyle  . Physical activity:    Days per week: Not on file    Minutes per session: Not on file  . Stress: Not on file  Relationships  . Social connections:    Talks on phone: Not on file    Gets together: Not on file    Attends religious service: Not on file    Active member of club or organization: Not on file    Attends meetings of clubs or organizations: Not on file    Relationship status: Not on file  . Intimate partner violence:    Fear of current or ex partner: Not on file    Emotionally abused: Not on file    Physically abused: Not on file    Forced sexual activity: Not on file  Other Topics Concern  . Not on file  Social History Narrative   Married, retired Administrator   1 son one daughter   2 caffeinated beverages daily    Family History  Problem Relation Age of Onset  . CAD Father   . Colon cancer Neg Hx   . Heart attack Neg Hx     ROS: no fevers or chills, productive cough, hemoptysis, dysphasia, odynophagia, melena, hematochezia, dysuria, hematuria, rash, seizure  activity, orthopnea, PND, pedal edema, claudication. Remaining systems are negative.  Physical Exam: Well-developed well-nourished in no acute distress.  Skin is warm and dry.  HEENT is normal.  Neck is supple.  Chest is clear to auscultation with normal expansion.  Cardiovascular exam is regular rate and rhythm. 3/6 systolic murmur  Abdominal exam nontender or distended. No masses palpated. Extremities show no edema. neuro grossly intact  ECG-atrial fibrillation with PVCs or aberrantly conducted beats.  Right bundle branch block.  Personally reviewed  A/P  1 coronary artery disease status post coronary artery  bypass graft-patient denies chest pain.  Continue medical therapy.  Continue Plavix and statin.  2 permanent atrial fibrillation-plan to continue Toprol for rate control. CHADSvasc 5.  I again discussed the risk of embolic event including CVA.  Patient does not want anticoagulation because of hematuria and GI bleed with Coumadin previously.  However I have given him the name of apixaban.  He will check on the price and will consider.  He will contact as if he is agreeable.  If he is agreeable we will begin and discontinue Plavix.  3 aortic stenosis-moderate on most recent echocardiogram.  Will repeat February 2020.  No symptoms at present.  4 hypertension-patient's blood pressure is controlled.  Continue present medications.  5 hyperlipidemia-continue statin.  Note he did not tolerate Lipitor or higher doses of pravastatin in the past.  Kirk Ruths, MD

## 2018-06-17 ENCOUNTER — Ambulatory Visit: Payer: Medicare HMO | Admitting: Cardiology

## 2018-06-17 ENCOUNTER — Encounter: Payer: Self-pay | Admitting: Cardiology

## 2018-06-17 VITALS — BP 125/63 | HR 81 | Ht 64.0 in | Wt 143.0 lb

## 2018-06-17 DIAGNOSIS — I350 Nonrheumatic aortic (valve) stenosis: Secondary | ICD-10-CM | POA: Diagnosis not present

## 2018-06-17 DIAGNOSIS — I4821 Permanent atrial fibrillation: Secondary | ICD-10-CM

## 2018-06-17 DIAGNOSIS — I2510 Atherosclerotic heart disease of native coronary artery without angina pectoris: Secondary | ICD-10-CM | POA: Diagnosis not present

## 2018-06-17 NOTE — Patient Instructions (Signed)
Medication Instructions:   NO CHANGE  Testing/Procedures:  Your physician has requested that you have an echocardiogram. Echocardiography is a painless test that uses sound waves to create images of your heart. It provides your doctor with information about the size and shape of your heart and how well your heart's chambers and valves are working. This procedure takes approximately one hour. There are no restrictions for this procedure.    Follow-Up:  DR Jens Som WOULD LIKE TO SEE YOU BACK IN 6 MONTHS-PLEASE CALL THE OFFICE 2 MONTHS PRIOR TO THAT APPOINTMENT TIME TO SCHEDULE.  If you need a refill on your cardiac medications before your next appointment, please call your pharmacy.

## 2018-09-10 ENCOUNTER — Ambulatory Visit: Payer: Medicare HMO | Admitting: Osteopathic Medicine

## 2018-09-14 ENCOUNTER — Telehealth: Payer: Self-pay | Admitting: Osteopathic Medicine

## 2018-09-14 ENCOUNTER — Ambulatory Visit (INDEPENDENT_AMBULATORY_CARE_PROVIDER_SITE_OTHER): Payer: Medicare Other | Admitting: Osteopathic Medicine

## 2018-09-14 ENCOUNTER — Encounter: Payer: Self-pay | Admitting: Osteopathic Medicine

## 2018-09-14 VITALS — BP 129/70 | HR 76 | Temp 97.9°F | Wt 141.0 lb

## 2018-09-14 DIAGNOSIS — Z951 Presence of aortocoronary bypass graft: Secondary | ICD-10-CM

## 2018-09-14 DIAGNOSIS — I4821 Permanent atrial fibrillation: Secondary | ICD-10-CM | POA: Diagnosis not present

## 2018-09-14 DIAGNOSIS — K219 Gastro-esophageal reflux disease without esophagitis: Secondary | ICD-10-CM

## 2018-09-14 DIAGNOSIS — D509 Iron deficiency anemia, unspecified: Secondary | ICD-10-CM | POA: Diagnosis not present

## 2018-09-14 DIAGNOSIS — I10 Essential (primary) hypertension: Secondary | ICD-10-CM | POA: Diagnosis not present

## 2018-09-14 DIAGNOSIS — I350 Nonrheumatic aortic (valve) stenosis: Secondary | ICD-10-CM

## 2018-09-14 DIAGNOSIS — Z23 Encounter for immunization: Secondary | ICD-10-CM

## 2018-09-14 DIAGNOSIS — M79672 Pain in left foot: Principal | ICD-10-CM

## 2018-09-14 DIAGNOSIS — M79671 Pain in right foot: Secondary | ICD-10-CM

## 2018-09-14 DIAGNOSIS — E1122 Type 2 diabetes mellitus with diabetic chronic kidney disease: Secondary | ICD-10-CM

## 2018-09-14 LAB — POCT GLYCOSYLATED HEMOGLOBIN (HGB A1C): HEMOGLOBIN A1C: 6.1 % — AB (ref 4.0–5.6)

## 2018-09-14 MED ORDER — PRAVASTATIN SODIUM 40 MG PO TABS: 40.0000 mg | ORAL_TABLET | Freq: Every day | ORAL | 3 refills | Status: DC

## 2018-09-14 MED ORDER — DOXAZOSIN MESYLATE 8 MG PO TABS: 8.0000 mg | ORAL_TABLET | Freq: Every day | ORAL | 3 refills | Status: DC

## 2018-09-14 MED ORDER — FERROUS SULFATE 325 (65 FE) MG PO TBEC: 325.0000 mg | DELAYED_RELEASE_TABLET | Freq: Every day | ORAL | 3 refills | Status: DC

## 2018-09-14 MED ORDER — PANTOPRAZOLE SODIUM 20 MG PO TBEC: 20.0000 mg | DELAYED_RELEASE_TABLET | Freq: Every day | ORAL | 3 refills | Status: DC

## 2018-09-14 MED ORDER — METOPROLOL SUCCINATE ER 25 MG PO TB24: 25.0000 mg | ORAL_TABLET | Freq: Every day | ORAL | 3 refills | Status: DC

## 2018-09-14 MED ORDER — GLIPIZIDE 5 MG PO TABS: ORAL_TABLET | ORAL | 3 refills | Status: DC

## 2018-09-14 MED ORDER — NITROGLYCERIN 0.4 MG SL SUBL: 0.4000 mg | SUBLINGUAL_TABLET | SUBLINGUAL | 1 refills | Status: DC | PRN

## 2018-09-14 MED ORDER — METFORMIN HCL 1000 MG PO TABS: ORAL_TABLET | ORAL | 3 refills | Status: DC

## 2018-09-14 NOTE — Patient Instructions (Signed)
I'll double check on the Aspirin with Dr Jens Som since this wasn't on your list Will see if we can track down records     Foods to consider avoiding:  2017 UpToDate Characteristics and sources of common FODMAPs  Word that corresponds to letter in acronym Compounds in this category Foods that contain these compounds  F Fermentable  O Oligosaccharides Fructans, galacto-oligosaccharides Wheat, barley, rye, onion, leek, white part of spring onion, garlic, shallots, artichokes, beetroot, fennel, peas, chicory, pistachio, cashews, legumes, lentils, and chickpeas  D Disaccharides Lactose Milk, custard, ice cream, and yogurt  M Monosaccharides "Free fructose" (fructose in excess of glucose) Apples, pears, mangoes, cherries, watermelon, asparagus, sugar snap peas, honey, high-fructose corn syrup  A And  P Polyols Sorbitol, mannitol, maltitol, and xylitol Apples, pears, apricots, cherries, nectarines, peaches, plums, watermelon, mushrooms, cauliflower, artificially sweetened chewing gum and confectionery  FODMAPs: fermentable oligosaccharides, disaccharides, monosaccharides, and polyols. Adapted by permission from [employer]: [employer]. Lonell Face, [employer]omer [employer], [place]. Short-chain carbohydrates and functional gastrointestinal disorders. Am J Gastroenterol 2013; 108:707. Copyright  2013. www.nature.com/ajg. Graphic 54270 Version 2.0

## 2018-09-14 NOTE — Telephone Encounter (Signed)
Pt reports foot pain, requests referral to podiatry

## 2018-09-14 NOTE — Progress Notes (Signed)
HPI: Garrett Hughes is a 78 y.o. male who  has a past medical history of A-fib ([place]) (12/20/2015), Aortic stenosis, mild (01/01/2017), Atherosclerosis of native coronary artery of native heart without angina pectoris (01/05/2016), Benign essential hypertension (01/05/2016), Cataract, nuclear, right (05/06/2014), Current use of long term anticoagulation (12/20/2015), Diabetic kidney disease ([place]) (01/05/2016), GERD (gastroesophageal reflux disease), History of Barrett's esophagus (12/20/2015), CABG (07/03/2016), Hyperlipemia (10/26/2015), Low back pain (05/02/2016), and Type 2 diabetes mellitus with kidney complication, without long-term current use of insulin ([place]) (07/03/2016).  he presents to [facility] today, 09/14/18,  for chief complaint of: Follow up DM2  Has been >1 year since last seen by me - reports seen by "a few other doctors" in the meantime and cannot recall their names. "I'd like to catch up on everything today" and requests vaccinations.   Recent cardiology notes reviewed: 06/2018 Dr Stanford Breed - CAD s/p CABG, Afib not on anticoagulation. +DOE. Continue Plavix, low potency statin (hx intolerance to higher doses/more potent Rx), Toprol, repeat Echo in Feb 2020 to monitor AS. Consider Eliquis and if so will d/c Plavix. Continue other BP meds.   Brings list today that includes ASA and Nitrostat on which he needs refills.    DIABETES SCREENING/PREVENTIVE CARE: A1C past 3-6 mos: done today controlled?    08/217: 6.5%  Today, 09/14/18: 6.1% on metformin 1000 mg bid, glipizide 5 mg bid BP goal <130/80: Yes  LDL goal <70: need labs Eye exam annually: none on file, importance discussed with patient Foot exam: No  Microalbuminuria:n/a on ACE Metformin: Yes  ACE/ARB: Yes  Antiplatelet if ASCVD Risk >10%: Yes  Statin: Yes  Pneumovax: need records   Immunization History  Administered Date(s) Administered  . Influenza, High Dose Seasonal PF 09/14/2018  .  Influenza,inj,Quad PF,6+ Mos 05/06/2017  . Tdap 09/14/2018        Past medical, surgical, social and family history reviewed:  Patient Active Problem List   Diagnosis Date Noted  . Hyperlipidemia   . GERD (gastroesophageal reflux disease)   . Diabetes mellitus without complication ([place])   . Atrial fibrillation ([place])   . Aortic stenosis, mild 01/01/2017  . 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 Type 2 diabetes mellitus with kidney complication, without long-term current use of insulin ([place]) 07/03/2016  . Low back pain 05/02/2016  . Atherosclerosis of native coronary artery of native heart without angina pectoris 01/05/2016  . Benign essential hypertension 01/05/2016  . Diabetic kidney disease ([place]) 01/05/2016  . A-fib ( [place]) 12/20/2015  . Current use of long term anticoagulation 12/20/2015  . History of Barrett's esophagus 12/20/2015  . Hyperlipemia 10/26/2015  . Cataract, nuclear, right 05/06/2014    Past Surgical History:  Procedure Laterality Date  . BACK SURGERY  2004  . [place]  . SHOULDER SURGERY  2005    Social History   Tobacco Use  . Smoking status: Former Smoker    Last attempt to quit: 09/10/1987    Years since quitting: 31.0  . Smokeless tobacco: Never Used  Substance Use Topics  . Alcohol use: No    Family History  Problem Relation Age of Onset  . CAD Father   . Colon cancer Neg Hx   . Heart attack Neg Hx      Current medication list and allergy/intolerance information reviewed:    Current Outpatient Medications  Medication Sig Dispense Refill  . ACCU-CHEK SOFTCLIX LANCETS lancets Use as instructed to check blood sugar up to qid.  Dx Code E11.22. 100 each 12  . albuterol (PROVENTIL HFA;VENTOLIN HFA) 108 (90 Base) MCG/ACT inhaler Inhale 1-2 puffs into the lungs every 6 (six) hours as needed for wheezing or shortness of breath. 1 Inhaler 0  . Alcohol Swabs PADS Use as instructed to check blood sugar up to qid. Dx code E11.22 100 each 1  . AMBULATORY  NON FORMULARY MEDICATION Medication Name: accu-chek aviva plus meter, accu-check aviva plus test strips & accu-chek softclix lancets to test twice a day. Dx: E11.29 1 vial PRN  . aspirin 81 MG chewable tablet Chew by mouth daily.    . Blood Glucose Calibration (ACCU-CHEK AVIVA) SOLN Use as instructed per manufacturer directions. Dx code E11.22 1 each 0  . Blood Glucose Monitoring Suppl (ACCU-CHEK AVIVA PLUS) w/Device KIT Use as instructed to check blood sugar up to qid. Dx code E11.22 1 kit 0  . clopidogrel (PLAVIX) 75 MG tablet Take 1 tablet (75 mg total) by mouth daily. Pt needs F/U appt w/PCP for further RFs. 90 tablet 0  . doxazosin (CARDURA) 8 MG tablet Take 1 tablet (8 mg total) by mouth daily. 90 tablet 3  . ferrous sulfate 325 (65 FE) MG EC tablet Take 1 tablet (325 mg total) by mouth daily with breakfast. 90 tablet 3  . glipiZIDE (GLUCOTROL) 5 MG tablet TAKE 1 TABLET TWICE DAILY BEFORE MEALS. 180 tablet 3  . glucose blood (ACCU-CHEK AVIVA PLUS) test strip Use as instructed to check blood sugar up to qid. Dx Code E11.22. 100 each 12  . Lancets (ONETOUCH ULTRASOFT) lancets Use as instructed to check blood sugar up to qid. Dx: E11.22 100 each 99  . lisinopril (PRINIVIL,ZESTRIL) 5 MG tablet Take 1 tablet (5 mg total) by mouth daily. Pt needs F/U appt w/PCP for further refills. 90 tablet 0  . metFORMIN (GLUCOPHAGE) 1000 MG tablet TAKE 1 TABLET TWICE DAILY WITH A MEAL. 180 tablet 3  . metoprolol succinate (TOPROL-XL) 25 MG 24 hr tablet Take 1 tablet (25 mg total) by mouth daily. Pt needs F/U appt w/PCP for further refills. 90 tablet 3  . nitroGLYCERIN (NITROSTAT) 0.4 MG SL tablet Place 1 tablet (0.4 mg total) under the tongue every 5 (five) minutes as needed for chest pain. 30 tablet 1  . pantoprazole (PROTONIX) 20 MG tablet Take 1 tablet (20 mg total) by mouth daily. 90 tablet 3  . pravastatin (PRAVACHOL) 40 MG tablet Take 1 tablet (40 mg total) by mouth daily. 90 tablet 3   No current  facility-administered medications for this visit.     Allergies  Allergen Reactions  . Atorvastatin     Other reaction(s): Myalgias (intolerance)  . Cholestyramine Other (See Comments)    Constipation  . Pravastatin Other (See Comments)      Review of Systems:  Constitutional:  No  fever, no chills, No recent illness  HEENT: No  headache, no vision change  Cardiac: No  chest pain, No  pressure, No palpitations, No  Orthopnea  Respiratory:  No  shortness of breath. No  Cough  Gastrointestinal: No  abdominal pain, No  nausea, No  vomiting,  No  blood in stool  Musculoskeletal: No new myalgia/arthralgia  Skin: No  Rash  Hem/Onc: No  easy bruising/bleeding  Neurologic: No  weakness, No  dizziness  Psychiatric: No  concerns with depression, No  concerns with anxiety  Exam:  BP 129/70 (BP Location: Left Arm, Patient Position: Sitting, Cuff Size: Normal)   Pulse 76   Temp 97.9  F (36.6 C) (Oral)   Wt 141 lb (64 kg)   BMI 24.20 kg/m   Constitutional: VS see above. General Appearance: alert, well-developed, well-nourished, NAD  Eyes: Normal lids and conjunctive, non-icteric sclera  Ears, Nose, Mouth, Throat: MMM, Normal external inspection ears/nares/mouth/lips/gums. TM normal bilaterally. Pharynx/tonsils no erythema, no exudate. Nasal mucosa normal.   Neck: No masses, trachea midline. No thyroid enlargement. No tenderness/mass appreciated. No lymphadenopathy  Respiratory: Normal respiratory effort. no wheeze, no rhonchi, no rales  Cardiovascular: S1/S2 normal, +murmur, no rub/gallop auscultated.  Irregularly irregular rhythm, normal rate. No lower extremity edema.   Gastrointestinal: Nontender, no masses  Musculoskeletal: Gait normal.   Neurological: Normal balance/coordination. No tremor.   Skin: warm, dry, intact.   Psychiatric: Normal judgment/insight. Normal mood and affect. Oriented x3.         ASSESSMENT/PLAN: The primary encounter diagnosis was  Type 2 diabetes mellitus with chronic kidney disease, without long-term current use of insulin, unspecified CKD stage ([place]). Diagnoses of Iron deficiency anemia, unspecified iron deficiency anemia type, Permanent atrial fibrillation, Gastroesophageal reflux disease without esophagitis, Aortic stenosis, mild, Hx of CABG, Benign essential hypertension, Need for influenza vaccination, and Need for Tdap vaccination were also pertinent to this visit.   Messaged cardiology to confirm whether patient needs to be on aspirin in addition to Plavix.  Patient is reluctant to discontinue glipizide, will leave on list for now but would consider stopping especially of hypoglycemia.   Orders Placed This Encounter  Procedures  . Flu vaccine HIGH DOSE PF (Fluzone High dose)  . Tdap vaccine greater than or equal to 7yo IM  . CBC  . COMPLETE METABOLIC PANEL WITH GFR  . Lipid panel  . Fe+TIBC+Fer  . Vitamin B12  . POCT HgB A1C    Meds ordered this encounter  Medications  . doxazosin (CARDURA) 8 MG tablet    Sig: Take 1 tablet (8 mg total) by mouth daily.    Dispense:  90 tablet    Refill:  3  . ferrous sulfate 325 (65 FE) MG EC tablet    Sig: Take 1 tablet (325 mg total) by mouth daily with breakfast.    Dispense:  90 tablet    Refill:  3  . glipiZIDE (GLUCOTROL) 5 MG tablet    Sig: TAKE 1 TABLET TWICE DAILY BEFORE MEALS.    Dispense:  180 tablet    Refill:  3  . metFORMIN (GLUCOPHAGE) 1000 MG tablet    Sig: TAKE 1 TABLET TWICE DAILY WITH A MEAL.    Dispense:  180 tablet    Refill:  3  . metoprolol succinate (TOPROL-XL) 25 MG 24 hr tablet    Sig: Take 1 tablet (25 mg total) by mouth daily. Pt needs F/U appt w/PCP for further refills.    Dispense:  90 tablet    Refill:  3  . nitroGLYCERIN (NITROSTAT) 0.4 MG SL tablet    Sig: Place 1 tablet (0.4 mg total) under the tongue every 5 (five) minutes as needed for chest pain.    Dispense:  30 tablet    Refill:  1  . pantoprazole (PROTONIX) 20 MG  tablet    Sig: Take 1 tablet (20 mg total) by mouth daily.    Dispense:  90 tablet    Refill:  3  . pravastatin (PRAVACHOL) 40 MG tablet    Sig: Take 1 tablet (40 mg total) by mouth daily.    Dispense:  90 tablet    Refill:  3  Patient Instructions   I'll double check on the Aspirin with Dr Stanford Breed since this wasn't on your list Will see if we can track down records     Foods to consider avoiding:  2017 UpToDate Characteristics and sources of common FODMAPs  Word that corresponds to letter in acronym Compounds in this category Foods that contain these compounds  F Fermentable  O Oligosaccharides Fructans, galacto-oligosaccharides Wheat, barley, rye, onion, leek, white part of spring onion, garlic, shallots, artichokes, beetroot, fennel, peas, chicory, pistachio, cashews, legumes, lentils, and chickpeas  D Disaccharides Lactose Milk, custard, ice cream, and yogurt  M Monosaccharides "Free fructose" (fructose in excess of glucose) Apples, pears, mangoes, cherries, watermelon, asparagus, sugar snap peas, honey, high-fructose corn syrup  A And  P Polyols Sorbitol, mannitol, maltitol, and xylitol Apples, pears, apricots, cherries, nectarines, peaches, plums, watermelon, mushrooms, cauliflower, artificially sweetened chewing gum and confectionery  FODMAPs: fermentable oligosaccharides, disaccharides, monosaccharides, and polyols. Adapted by permission from [employer]: [employer]. Agustin Cree, [employer], [place]. Short-chain carbohydrates and functional gastrointestinal disorders. Am J Gastroenterol 2013; 108:707. Copyright  2013. www.nature.com/ajg. Graphic [phone] Version 2.0         Visit summary with medication list and pertinent instructions was printed for patient to review. All questions at time of visit were answered - patient instructed to contact office with any additional concerns or updates. ER/RTC precautions were reviewed  with the patient.    Please note: voice recognition software was used to produce this document, and typos may escape review. Please contact Dr. Sheppard Coil for any needed clarifications.     Follow-up plan: Return in about 6 months (around 03/15/2019) for [place], [place] .

## 2018-09-15 LAB — LIPID PANEL
Cholesterol: 153 mg/dL (ref <200)
HDL: 65 mg/dL (ref >40)
LDL Cholesterol (Calc): 74 mg/dL (calc)
NON-HDL CHOLESTEROL (CALC): 88 mg/dL (ref <130)
Total CHOL/HDL Ratio: 2.4 (calc) (ref <5.0)
Triglycerides: 67 mg/dL (ref <150)

## 2018-09-15 LAB — COMPLETE METABOLIC PANEL WITH GFR
AG Ratio: 1.5 (calc) (ref 1.0–2.5)
ALBUMIN MSPROF: 3.8 g/dL (ref 3.6–5.1)
ALT: 15 U/L (ref 9–46)
AST: 20 U/L (ref 10–35)
Alkaline phosphatase (APISO): 114 U/L (ref 40–115)
BUN/Creatinine Ratio: 36 (calc) — ABNORMAL HIGH (ref 6–22)
BUN: 23 mg/dL (ref 7–25)
CO2: 28 mmol/L (ref 20–32)
Calcium: 9.3 mg/dL (ref 8.6–10.3)
Chloride: 103 mmol/L (ref 98–110)
Creat: 0.64 mg/dL — ABNORMAL LOW (ref 0.70–1.18)
GFR, EST NON AFRICAN AMERICAN: 94 mL/min/{1.73_m2} (ref >=60)
GFR, Est African American: 109 mL/min/{1.73_m2} (ref >=60)
GLOBULIN: 2.6 g/dL (ref 1.9–3.7)
Glucose, Bld: 131 mg/dL — ABNORMAL HIGH (ref 65–99)
Potassium: 4 mmol/L (ref 3.5–5.3)
SODIUM: 140 mmol/L (ref 135–146)
Total Bilirubin: 0.5 mg/dL (ref 0.2–1.2)
Total Protein: 6.4 g/dL (ref 6.1–8.1)

## 2018-09-15 LAB — IRON,TIBC AND FERRITIN PANEL
%SAT: 10 % (calc) — ABNORMAL LOW (ref 20–48)
Ferritin: 22 ng/mL — ABNORMAL LOW (ref 24–380)
Iron: 48 ug/dL — ABNORMAL LOW (ref 50–180)
TIBC: 486 mcg/dL (calc) — ABNORMAL HIGH (ref 250–425)

## 2018-09-15 LAB — CBC
HCT: 35.5 % — ABNORMAL LOW (ref 38.5–50.0)
HEMOGLOBIN: 11.8 g/dL — AB (ref 13.2–17.1)
MCH: 31.9 pg (ref 27.0–33.0)
MCHC: 33.2 g/dL (ref 32.0–36.0)
MCV: 95.9 fL (ref 80.0–100.0)
MPV: 12 fL (ref 7.5–12.5)
Platelets: 152 10*3/uL (ref 140–400)
RBC: 3.7 10*6/uL — ABNORMAL LOW (ref 4.20–5.80)
RDW: 13.4 % (ref 11.0–15.0)
WBC: 5 10*3/uL (ref 3.8–10.8)

## 2018-10-08 NOTE — Progress Notes (Signed)
Subjective:   Garrett Hughes is a 78 y.o. male who presents for an Initial Medicare Annual Wellness Visit.  Review of Systems  No ROS.  Medicare Wellness Visit. Additional risk factors are reflected in the social history.  Cardiac Risk Factors include: advanced age (>64mn, >>21women);diabetes mellitus;dyslipidemia;male gender;hypertension Sleep patterns:Wakes up and feels rested Getting 8 hours of sleep a night. Wakes up during the night 3 times mostly to go and use the bathroom.  Home Safety/Smoke Alarms: Feels safe in home. Smoke alarms in place.  Living environment; Lives with wife in 2 story home and stairs are present with handrails in place. Shower is a walk in shower that has a bench seat in it. Seat Belt Safety/Bike Helmet: Wears seat belt.  Male:   CCS-  Aged out  PSA- No results found for: PSA lab ordered while in office today     Objective:    Today's Vitals   10/12/18 1049  BP: 130/70  Pulse: 84  SpO2: 96%  Weight: 145 lb (65.8 kg)  Height: _0  (1.626 m)  PainSc: 4    Body mass index is 24.89 kg/m.  Advanced Directives 10/12/2018 05/06/2017  Does Patient Have a Medical Advance Directive? Yes Yes  Type of AParamedicof AWeippeLiving will HTexhomaLiving will  Does patient want to make changes to medical advance directive? No - Patient declined -  Copy of [place]in Chart? No - copy requested No - copy requested    Current Medications (verified) Outpatient Encounter Medications as of 10/12/2018  Medication Sig  . ACCU-CHEK SOFTCLIX LANCETS lancets Use as instructed to check blood sugar up to qid. Dx Code E11.22.  .Marland KitchenAlcohol Swabs PADS Use as instructed to check blood sugar up to qid. Dx code E11.22  . AMBULATORY NON FORMULARY MEDICATION Medication Name: accu-chek aviva plus meter, accu-check aviva plus test strips & accu-chek softclix lancets to test twice a day. Dx: E11.29  . aspirin 81 MG  chewable tablet Chew by mouth daily.  . Blood Glucose Calibration (ACCU-CHEK AVIVA) SOLN Use as instructed per manufacturer directions. Dx code E11.22  . Blood Glucose Monitoring Suppl (ACCU-CHEK AVIVA PLUS) w/Device KIT Use as instructed to check blood sugar up to qid. Dx code E11.22  . clopidogrel (PLAVIX) 75 MG tablet Take 1 tablet (75 mg total) by mouth daily. Pt needs F/U appt w/PCP for further RFs.  .Marland Kitchendoxazosin (CARDURA) 8 MG tablet Take 1 tablet (8 mg total) by mouth daily.  . ferrous sulfate 325 (65 FE) MG EC tablet Take 1 tablet (325 mg total) by mouth daily with breakfast.  . glipiZIDE (GLUCOTROL) 5 MG tablet TAKE 1 TABLET TWICE DAILY BEFORE MEALS.  . glucose blood (ACCU-CHEK AVIVA PLUS) test strip Use as instructed to check blood sugar up to qid. Dx Code E11.22.  .Marland KitchenLancets (ONETOUCH ULTRASOFT) lancets Use as instructed to check blood sugar up to qid. Dx: E11.22  . lisinopril (PRINIVIL,ZESTRIL) 5 MG tablet Take 1 tablet (5 mg total) by mouth daily. Pt needs F/U appt w/PCP for further refills.  . metFORMIN (GLUCOPHAGE) 1000 MG tablet TAKE 1 TABLET TWICE DAILY WITH A MEAL.  . metoprolol succinate (TOPROL-XL) 25 MG 24 hr tablet Take 1 tablet (25 mg total) by mouth daily. Pt needs F/U appt w/PCP for further refills.  . nitroGLYCERIN (NITROSTAT) 0.4 MG SL tablet Place 1 tablet (0.4 mg total) under the tongue every 5 (five) minutes as needed for chest  pain.  . pantoprazole (PROTONIX) 20 MG tablet Take 1 tablet (20 mg total) by mouth daily.  . pravastatin (PRAVACHOL) 40 MG tablet Take 1 tablet (40 mg total) by mouth daily.  . [DISCONTINUED] albuterol (PROVENTIL HFA;VENTOLIN HFA) 108 (90 Base) MCG/ACT inhaler Inhale 1-2 puffs into the lungs every 6 (six) hours as needed for wheezing or shortness of breath.   No facility-administered encounter medications on file as of 10/12/2018.     Allergies (verified) Atorvastatin; Cholestyramine; and Pravastatin   History: Past Medical History:   Diagnosis Date  . A-fib ([place]) 12/20/2015  . Aortic stenosis, mild 01/01/2017  . Atherosclerosis of native coronary artery of native heart without angina pectoris 01/05/2016  . Benign essential hypertension 01/05/2016  . Cataract, nuclear, right 05/06/2014  . Current use of long term anticoagulation 12/20/2015  . Diabetic kidney disease ([place]) 01/05/2016  . GERD (gastroesophageal reflux disease)   . History of Barrett's esophagus 12/20/2015  . 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 Hyperlipemia 10/26/2015  . Low back pain 05/02/2016  . Type 2 diabetes mellitus with kidney complication, without long-term current use of insulin ([place]) 07/03/2016   Past Surgical History:  Procedure Laterality Date  . BACK SURGERY  2004  . [place]  . SHOULDER SURGERY  2005   Family History  Problem Relation Age of Onset  . CAD Father   . Colon cancer Neg Hx   . Heart attack Neg Hx    Social History   Socioeconomic History  . Marital status: Married    Spouse name: Edd Fabian  . Number of children: 2  . Years of education: 46  . Highest education level: 11th grade  Occupational History  . 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 Financial resource strain: Not hard at all  . Food insecurity:    Worry: Never true    Inability: Never true  . Transportation needs:    Medical: No    Non-medical: No  Tobacco Use  . Smoking status: Former Smoker    Last attempt to quit: 09/10/1987    Years since quitting: 31.1  . Smokeless tobacco: Never Used  Substance and Sexual Activity  . Alcohol use: No  . Drug use: No  . Sexual activity: Not Currently  Lifestyle  . Physical activity:    Days per week: 6 days    Minutes per session: 60 min  . Stress: Not at all  Relationships  . Social connections:    Talks on phone: More than three times a week    Gets together: Once a week    Attends religious service: More than 4 times per year     Active member of club or organization: No    Attends meetings of clubs or organizations: Never    Relationship status: Married  Other Topics Concern  . 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 Keeps grandsons and granddaughter   Tobacco Counseling Counseling given: No   Clinical Intake:  Pre-visit preparation completed: Yes  Pain : 0-10 Pain Score: 4  Pain Type: Chronic pain Pain Location: Back Pain Orientation: Left, Right, Mid Pain Descriptors / Indicators: Constant, Aching, Throbbing Pain Onset: More than a month ago Pain Frequency: Constant Pain Relieving Factors: nothing relieves the pain Effect of  Pain on Daily Activities: it does have effect on daily activities  Pain Relieving Factors: nothing relieves the pain  Nutritional Risks: None Diabetes: Yes CBG done?: No(ne at home FBS 121) Did pt. bring in CBG monitor from home?: No  How often do you need to have someone help you when you read instructions, pamphlets, or other written materials from your doctor or pharmacy?: 1 - Never What is the last grade level you completed in school?: 11  Interpreter Needed?: No  Information entered by :: Orlie Dakin, LPN  Activities of Daily Living In your present state of health, do you have any difficulty performing the following activities: 10/12/2018  Hearing? N  Vision? N  Difficulty concentrating or making decisions? N  Walking or climbing stairs? N  Dressing or bathing? N  Doing errands, shopping? N  Preparing Food and eating ? N  Using the Toilet? N  In the past six months, have you accidently leaked urine? N  Do you have problems with loss of bowel control? N  Managing your Medications? N  Managing your Finances? N  Housekeeping or managing your Housekeeping? N  Some recent data might be hidden     Immunizations and Health Maintenance Immunization History  Administered  Date(s) Administered  . Influenza, High Dose Seasonal PF 09/14/2018  . Influenza,inj,Quad PF,6+ Mos 05/06/2017  . Tdap 09/14/2018   Health Maintenance Due  Topic Date Due  . FOOT EXAM  10/23/1950  . OPHTHALMOLOGY EXAM  10/23/1950  . PNA vac Low Risk Adult (1 of 2 - PCV13) 10/23/2005    Patient Care Team: Emeterio Reeve, DO as PCP - General (Osteopathic Medicine)  Indicate any recent Medical Services you may have received from other than [facility] providers in the past year (date may be approximate).    Assessment:   This is a routine wellness examination for Jazion.Physical assessment deferred to PCP.   Hearing/Vision screen  Visual Acuity Screening   Right eye Left eye Both eyes  Without correction:     With correction: _0  Hearing Screening Comments: Whisper test done and patient reported back all 3 words correctly  Dietary issues and exercise activities discussed: Current Exercise Habits: Home exercise routine, Type of exercise: walking, Time (Minutes): 60, Frequency (Times/Week): 6, Weekly Exercise (Minutes/Week): 360, Intensity: Mild, Exercise limited by: cardiac condition(s) Diet tries to eat a healthy diet with vegetables and fruits, sweet potato, doesn't eat much red med. Drinks water all day. Breakfast: sausage egg, or waffle, grits Lunch: schicken salad or pimento cheesandwich- Dinner: vegetable with salad occasional meat.      Goals   None    Depression Screen PHQ 2/9 Scores 10/12/2018 09/14/2018 05/06/2017 05/06/2017  PHQ - 2 Score 0 0 0 0  PHQ- 9 Score 3 8 0 -    Fall Risk Fall Risk  10/12/2018 05/06/2017  Falls in the past year? 0 No  Follow up Falls prevention discussed -    Is the patient's home free of loose throw rugs in walkways, pet beds, electrical cords, etc?   yes      Grab bars in the bathroom? no      Handrails on the stairs?   yes      Adequate lighting?   yes  Cognitive Function:     6CIT Screen 10/12/2018  What Year? 0 points   What month? 0 points  What time? 0 points  Count back from 20 0 points  Months in reverse 0 points  Repeat  phrase 0 points  Total Score 0    Screening Tests Health Maintenance  Topic Date Due  . FOOT EXAM  10/23/1950  . OPHTHALMOLOGY EXAM  10/23/1950  . PNA vac Low Risk Adult (1 of 2 - PCV13) 10/23/2005  . HEMOGLOBIN A1C  03/15/2019  . TETANUS/TDAP  09/14/2028  . INFLUENZA VACCINE  Completed      Plan:    Patient has appointment with Dr. Sheppard Coil in July for follow up  Please schedule your next medicare wellness visit with me in 1 yr. Please schedule your next medicare wellness visit with me in 1 yr. Continue doing brain stimulating activities (puzzles, reading, adult coloring books, staying active) to keep memory sharp.  Bring a copy of your living will and/or healthcare power of attorney to your next office visit.   I have personally reviewed and noted the following in the patient's chart:   . Medical and social history . Use of alcohol, tobacco or illicit drugs  . Current medications and supplements . Functional ability and status . Nutritional status . Physical activity . Advanced directives . List of other physicians . Hospitalizations, surgeries, and ER visits in previous 12 months . Vitals . Screenings to include cognitive, depression, and falls . Referrals and appointments  In addition, I have reviewed and discussed with patient certain preventive protocols, quality metrics, and best practice recommendations. A written personalized care plan for preventive services as well as general preventive health recommendations were provided to patient.     Joanne Chars, LPN   04/12/8183

## 2018-10-12 ENCOUNTER — Ambulatory Visit (INDEPENDENT_AMBULATORY_CARE_PROVIDER_SITE_OTHER): Payer: Medicare Other | Admitting: *Deleted

## 2018-10-12 VITALS — BP 130/70 | HR 84 | Ht 64.0 in | Wt 145.0 lb

## 2018-10-12 DIAGNOSIS — Z87898 Personal history of other specified conditions: Secondary | ICD-10-CM

## 2018-10-12 DIAGNOSIS — Z0000 Encounter for general adult medical examination without abnormal findings: Secondary | ICD-10-CM | POA: Diagnosis not present

## 2018-10-12 NOTE — Patient Instructions (Signed)
Please schedule your next medicare wellness visit with me in 1 yr. Please schedule your next medicare wellness visit with me in 1 yr. Continue doing brain stimulating activities (puzzles, reading, adult coloring books, staying active) to keep memory sharp.  Bring a copy of your living will and/or healthcare power of attorney to your next office visit.  Diabetes Basics  Diabetes (diabetes mellitus) is a long-term (chronic) disease. It occurs when the body does not properly use sugar (glucose) that is released from food after you eat. Diabetes may be caused by one or both of these problems:  Your pancreas does not make enough of a hormone called insulin.  Your body does not react in a normal way to insulin that it makes. Insulin lets sugars (glucose) go into cells in your body. This gives you energy. If you have diabetes, sugars cannot get into cells. This causes high blood sugar (hyperglycemia). Follow these instructions at home: How is diabetes treated? You may need to take insulin or other diabetes medicines daily to keep your blood sugar in balance. Take your diabetes medicines every day as told by your doctor. List your diabetes medicines here: Diabetes medicines  Name of medicine: ______________________________ ? Amount (dose): _______________ Time (a.m./p.m.): _______________ Notes: ___________________________________  Name of medicine: ______________________________ ? Amount (dose): _______________ Time (a.m./p.m.): _______________ Notes: ___________________________________  Name of medicine: ______________________________ ? Amount (dose): _______________ Time (a.m./p.m.): _______________ Notes: ___________________________________ If you use insulin, you will learn how to give yourself insulin by injection. You may need to adjust the amount based on the food that you eat. List the types of insulin you use here: Insulin  Insulin type: ______________________________ ? Amount  (dose): _______________ Time (a.m./p.m.): _______________ Notes: ___________________________________  Insulin type: ______________________________ ? Amount (dose): _______________ Time (a.m./p.m.): _______________ Notes: ___________________________________  Insulin type: ______________________________ ? Amount (dose): _______________ Time (a.m./p.m.): _______________ Notes: ___________________________________  Insulin type: ______________________________ ? Amount (dose): _______________ Time (a.m./p.m.): _______________ Notes: ___________________________________  Insulin type: ______________________________ ? Amount (dose): _______________ Time (a.m./p.m.): _______________ Notes: ___________________________________ How do I manage my blood sugar?  Check your blood sugar levels using a blood glucose monitor as directed by your doctor. Your doctor will set treatment goals for you. Generally, you should have these blood sugar levels:  Before meals (preprandial): 80-130 mg/dL (1.6-1.0 mmol/L).  After meals (postprandial): below 180 mg/dL (10 mmol/L).  A1c level: less than 7%. Write down the times that you will check your blood sugar levels: Blood sugar checks  Time: _______________ Notes: ___________________________________  Time: _______________ Notes: ___________________________________  Time: _______________ Notes: ___________________________________  Time: _______________ Notes: ___________________________________  Time: _______________ Notes: ___________________________________  Time: _______________ Notes: ___________________________________  What do I need to know about low blood sugar? Low blood sugar is called hypoglycemia. This is when blood sugar is at or below 70 mg/dL (3.9 mmol/L). Symptoms may include:  Feeling: ? Hungry. ? Worried or nervous (anxious). ? Sweaty and clammy. ? Confused. ? Dizzy. ? Sleepy. ? Sick to your stomach (nauseous).  Having: ? A fast  heartbeat. ? A headache. ? A change in your vision. ? Tingling or no feeling (numbness) around the mouth, lips, or tongue. ? Jerky movements that you cannot control (seizure).  Having trouble with: ? Moving (coordination). ? Sleeping. ? Passing out (fainting). ? Getting upset easily (irritability). Treating low blood sugar To treat low blood sugar, eat or drink something sugary right away. If you can think clearly and swallow safely, follow the 15:15 rule:  Take 15 grams of a fast-acting  carb (carbohydrate). Talk with your doctor about how much you should take.  Some fast-acting carbs are: ? Sugar tablets (glucose pills). Take 3-4 glucose pills. ? 6-8 pieces of hard candy. ? 4-6 oz (120-150 mL) of fruit juice. ? 4-6 oz (120-150 mL) of regular (not diet) soda. ? 1 Tbsp (15 mL) honey or sugar.  Check your blood sugar 15 minutes after you take the carb.  If your blood sugar is still at or below 70 mg/dL (3.9 mmol/L), take 15 grams of a carb again.  If your blood sugar does not go above 70 mg/dL (3.9 mmol/L) after 3 tries, get help right away.  After your blood sugar goes back to normal, eat a meal or a snack within 1 hour. Treating very low blood sugar If your blood sugar is at or below 54 mg/dL (3 mmol/L), you have very low blood sugar (severe hypoglycemia). This is an emergency. Do not wait to see if the symptoms will go away. Get medical help right away. Call your local emergency services (911 in the [place] Do not drive yourself to the hospital. Questions to ask your health care provider  Do I need to meet with a diabetes educator?  What equipment will I need to care for myself at home?  What diabetes medicines do I need? When should I take them?  How often do I need to check my blood sugar?  What number can I call if I have questions?  When is my next doctor's visit?  Where can I find a support group for people with diabetes? Where to find more information  American  Diabetes Association: www.diabetes.org  American Association of Diabetes Educators: www.diabeteseducator.org/patient-resources Contact a doctor if:  Your blood sugar is at or above 240 mg/dL (20.3 mmol/L) for 2 days in a row.  You have been sick or have had a fever for 2 days or more, and you are not getting better.  You have any of these problems for more than 6 hours: ? You cannot eat or drink. ? You feel sick to your stomach (nauseous). ? You throw up (vomit). ? You have watery poop (diarrhea). Get help right away if:  Your blood sugar is lower than 54 mg/dL (3 mmol/L).  You get confused.  You have trouble: ? Thinking clearly. ? Breathing. Summary  Diabetes (diabetes mellitus) is a long-term (chronic) disease. It occurs when the body does not properly use sugar (glucose) that is released from food after digestion.  Take insulin and diabetes medicines as told.  Check your blood sugar every day, as often as told.  Keep all follow-up visits as told by your doctor. This is important. This information is not intended to replace advice given to you by your health care provider. Make sure you discuss any questions you have with your health care provider. Document Released: 11/28/2017 Document Revised: 02/16/2018 Document Reviewed: 11/28/2017 Elsevier Interactive Patient Education  2019 [employer].

## 2018-10-13 ENCOUNTER — Ambulatory Visit (HOSPITAL_BASED_OUTPATIENT_CLINIC_OR_DEPARTMENT_OTHER)
Admission: RE | Admit: 2018-10-13 | Discharge: 2018-10-13 | Disposition: A | Discharge status: Home / Self Care | Payer: Medicare Other | Source: Ambulatory Visit | Attending: Cardiology | Admitting: Cardiology

## 2018-10-13 DIAGNOSIS — I350 Nonrheumatic aortic (valve) stenosis: Secondary | ICD-10-CM | POA: Diagnosis not present

## 2018-10-13 LAB — PSA: PSA: 0.4 ng/mL (ref <=4.0)

## 2018-10-13 NOTE — Progress Notes (Signed)
  Echocardiogram 2D Echocardiogram has been performed.  Lummie Montijo T Nels Munn 10/13/2018, 9:04 AM

## 2018-12-20 ENCOUNTER — Other Ambulatory Visit: Payer: Self-pay | Admitting: Osteopathic Medicine

## 2019-03-17 ENCOUNTER — Ambulatory Visit (INDEPENDENT_AMBULATORY_CARE_PROVIDER_SITE_OTHER): Payer: Medicare Other | Admitting: Osteopathic Medicine

## 2019-03-17 ENCOUNTER — Ambulatory Visit: Payer: Medicare Other

## 2019-03-17 ENCOUNTER — Ambulatory Visit (INDEPENDENT_AMBULATORY_CARE_PROVIDER_SITE_OTHER): Payer: Medicare Other

## 2019-03-17 ENCOUNTER — Encounter: Payer: Self-pay | Admitting: Osteopathic Medicine

## 2019-03-17 ENCOUNTER — Other Ambulatory Visit: Payer: Self-pay

## 2019-03-17 VITALS — BP 149/68 | HR 78 | Temp 98.2°F | Wt 146.4 lb

## 2019-03-17 DIAGNOSIS — E785 Hyperlipidemia, unspecified: Secondary | ICD-10-CM

## 2019-03-17 DIAGNOSIS — R0601 Orthopnea: Secondary | ICD-10-CM | POA: Diagnosis not present

## 2019-03-17 DIAGNOSIS — I5021 Acute systolic (congestive) heart failure: Secondary | ICD-10-CM | POA: Diagnosis not present

## 2019-03-17 DIAGNOSIS — I4821 Permanent atrial fibrillation: Secondary | ICD-10-CM

## 2019-03-17 DIAGNOSIS — E1169 Type 2 diabetes mellitus with other specified complication: Secondary | ICD-10-CM

## 2019-03-17 DIAGNOSIS — I10 Essential (primary) hypertension: Secondary | ICD-10-CM

## 2019-03-17 DIAGNOSIS — R7989 Other specified abnormal findings of blood chemistry: Secondary | ICD-10-CM | POA: Diagnosis not present

## 2019-03-17 DIAGNOSIS — E1122 Type 2 diabetes mellitus with diabetic chronic kidney disease: Secondary | ICD-10-CM

## 2019-03-17 DIAGNOSIS — R600 Localized edema: Secondary | ICD-10-CM

## 2019-03-17 DIAGNOSIS — Z79899 Other long term (current) drug therapy: Secondary | ICD-10-CM | POA: Diagnosis not present

## 2019-03-17 DIAGNOSIS — E782 Mixed hyperlipidemia: Secondary | ICD-10-CM

## 2019-03-17 DIAGNOSIS — E119 Type 2 diabetes mellitus without complications: Secondary | ICD-10-CM | POA: Diagnosis not present

## 2019-03-17 DIAGNOSIS — D509 Iron deficiency anemia, unspecified: Secondary | ICD-10-CM | POA: Diagnosis not present

## 2019-03-17 LAB — POCT GLYCOSYLATED HEMOGLOBIN (HGB A1C): Hemoglobin A1C: 6.5 % — AB (ref 4.0–5.6)

## 2019-03-17 MED ORDER — FUROSEMIDE 40 MG PO TABS: 40.0000 mg | ORAL_TABLET | Freq: Two times a day (BID) | ORAL | 1 refills | Status: DC

## 2019-03-17 NOTE — Patient Instructions (Signed)
Plan: I think this is probably heart failure related.  Start Lasix medication: 40 mg twice daily until swelling improves, then once daily is ok.  I'd like to get some blood work and a chest Xray today Let's see you back son to recheck!

## 2019-03-17 NOTE — Progress Notes (Signed)
HPI: Garrett Hughes is a 78 y.o. male who  has a past medical history of A-fib ([place]) (12/20/2015), Aortic stenosis, mild (01/01/2017), Atherosclerosis of native coronary artery of native heart without angina pectoris (01/05/2016), Benign essential hypertension (01/05/2016), Cataract, nuclear, right (05/06/2014), Current use of long term anticoagulation (12/20/2015), Diabetic kidney disease ([place]) (01/05/2016), GERD (gastroesophageal reflux disease), History of Barrett's esophagus (12/20/2015), CABG (07/03/2016), Hyperlipemia (10/26/2015), Low back pain (05/02/2016), and Type 2 diabetes mellitus with kidney complication, without long-term current use of insulin ([place]) (07/03/2016).  he presents to [facility] today, 03/17/19,  for chief complaint of:  A1c check LE swelling  A1C looks good if a little higher than previous we are at goal.   Reports LE swelling, comes and goes, worse later in the day, associated with SOB on exertion and orthopnea, ongoing for couple months, had echo earlier this year but was not having these symptoms at that time.    Patient is accompanied by wife who assists with history-taking.    At today's visit 03/17/19 ... PMH, PSH, FH reviewed and updated as needed.  Current medication list and allergy/intolerance hx reviewed and updated as needed. (See remainder of HPI, ROS, Phys Exam below)   No results found.  Results for orders placed or performed in visit on 03/17/19 (from the past 72 hour(s))  POCT HgB A1C     Status: Abnormal   Collection Time: 03/17/19  9:20 AM  Result Value Ref Range   Hemoglobin A1C 6.5 (A) 4.0 - 5.6 %   HbA1c POC (<> result, manual entry)     HbA1c, POC (prediabetic range)     HbA1c, POC (controlled diabetic range)            ASSESSMENT/PLAN: The primary encounter diagnosis was Acute systolic heart failure ([place]). Diagnoses of Orthopnea, Type 2 diabetes mellitus with chronic kidney disease, without long-term  current use of insulin, unspecified CKD stage ([place]), Permanent atrial fibrillation, Mixed hyperlipidemia, Hyperlipidemia associated with type 2 diabetes mellitus ([place]), Iron deficiency anemia, unspecified iron deficiency anemia type, Benign essential hypertension, and Lower extremity edema were also pertinent to this visit.  Meeting criteria for heart failure, will start diuretics, get labs and CXR, ER precautions reviewed with patient and wife   Orders Placed This Encounter  Procedures  . DG Chest 2 View  . CBC  . COMPLETE METABOLIC PANEL WITH GFR  . Lipid panel  . Fe+TIBC+Fer  . Vitamin B12  . B Nat Peptide  . POCT HgB A1C     Meds ordered this encounter  Medications  . furosemide (LASIX) 40 MG tablet    Sig: Take 1 tablet (40 mg total) by mouth 2 (two) times daily.    Dispense:  60 tablet    Refill:  1    Patient Instructions  Plan: I think this is probably heart failure related.  Start Lasix medication: 40 mg twice daily until swelling improves, then once daily is ok.  I'd like to get some blood work and a chest Xray today Let's see you back son to recheck!       Follow-up plan: Return for See me Friday or Monday to recheck breathing and swelling .                                                 ################################################# ################################################# ################################################# #################################################  Current Meds  Medication Sig  . ACCU-CHEK SOFTCLIX LANCETS lancets Use as instructed to check blood sugar up to qid. Dx Code E11.22.  Marland Kitchen Alcohol Swabs PADS Use as instructed to check blood sugar up to qid. Dx code E11.22  . AMBULATORY NON FORMULARY MEDICATION Medication Name: accu-chek aviva plus meter, accu-check aviva plus test strips & accu-chek softclix lancets to test twice a day. Dx: E11.29  . aspirin 81 MG chewable tablet  Chew by mouth daily.  . Blood Glucose Calibration (ACCU-CHEK AVIVA) SOLN Use as instructed per manufacturer directions. Dx code E11.22  . Blood Glucose Monitoring Suppl (ACCU-CHEK AVIVA PLUS) w/Device KIT Use as instructed to check blood sugar up to qid. Dx code E11.22  . clopidogrel (PLAVIX) 75 MG tablet TAKE 1 TABLET BY MOUTH  DAILY  . doxazosin (CARDURA) 8 MG tablet Take 1 tablet (8 mg total) by mouth daily.  . ferrous sulfate 325 (65 FE) MG EC tablet Take 1 tablet (325 mg total) by mouth daily with breakfast.  . glipiZIDE (GLUCOTROL) 5 MG tablet TAKE 1 TABLET TWICE DAILY BEFORE MEALS.  . glucose blood (ACCU-CHEK AVIVA PLUS) test strip Use as instructed to check blood sugar up to qid. Dx Code E11.22.  Marland Kitchen Lancets (ONETOUCH ULTRASOFT) lancets Use as instructed to check blood sugar up to qid. Dx: E11.22  . lisinopril (PRINIVIL,ZESTRIL) 5 MG tablet TAKE 1 TABLET BY MOUTH  DAILY  . metFORMIN (GLUCOPHAGE) 1000 MG tablet TAKE 1 TABLET TWICE DAILY WITH A MEAL.  . metoprolol succinate (TOPROL-XL) 25 MG 24 hr tablet Take 1 tablet (25 mg total) by mouth daily. Pt needs F/U appt w/PCP for further refills.  . nitroGLYCERIN (NITROSTAT) 0.4 MG SL tablet Place 1 tablet (0.4 mg total) under the tongue every 5 (five) minutes as needed for chest pain.  . pantoprazole (PROTONIX) 20 MG tablet Take 1 tablet (20 mg total) by mouth daily.  . pravastatin (PRAVACHOL) 40 MG tablet Take 1 tablet (40 mg total) by mouth daily.    Allergies  Allergen Reactions  . Atorvastatin     Other reaction(s): Myalgias (intolerance)  . Cholestyramine Other (See Comments)    Constipation  . Pravastatin Other (See Comments)       Review of Systems:  Constitutional: No recent illness, no fever/chills  HEENT: No  headache, no vision change  Cardiac: No  chest pain, No  pressure, No palpitations  Respiratory:  +shortness of breath. No  Cough  Gastrointestinal: No  abdominal pain, no change on bowel  habits  Musculoskeletal: No new myalgia/arthralgia  Skin: No  Rash  Neurologic: +generalized weakness, No  Dizziness   Exam:  BP (!) 149/68 (BP Location: Left Arm, Patient Position: Sitting, Cuff Size: Normal)   Pulse 78   Temp 98.2 F (36.8 C) (Oral)   Wt 146 lb 6.4 oz (66.4 kg)   BMI 25.13 kg/m   Constitutional: VS see above. General Appearance: alert, well-developed, well-nourished, NAD  Eyes: Normal lids and conjunctive, non-icteric sclera  Ears, Nose, Mouth, Throat: MMM, Normal external inspection ears/nares/mouth/lips/gums.  Neck: No masses, trachea midline.   Respiratory: Normal respiratory effort. no wheeze, no rhonchi, +rales  Cardiovascular: S1/S2 normal, +murmur, no rub/gallop auscultated. Irreg/irreg.   Musculoskeletal: Gait normal. Symmetric and independent movement of all extremities  Neurological: Normal balance/coordination. No tremor.  Skin: warm, dry, intact.   Psychiatric: Normal judgment/insight. Normal mood and affect. Oriented x3.       Visit summary with medication list and pertinent instructions was printed for patient  to review, patient was advised to alert [place] if any updates are needed. All questions at time of visit were answered - patient instructed to contact office with any additional concerns. ER/RTC precautions were reviewed with the patient and understanding verbalized.   Note: Total time spent 25 minutes, greater than 50% of the visit was spent face-to-face counseling and coordinating care for the following: The primary encounter diagnosis was Lower extremity edema. Diagnoses of Orthopnea, Type 2 diabetes mellitus with chronic kidney disease, without long-term current use of insulin, unspecified CKD stage ([place]), Permanent atrial fibrillation, Mixed hyperlipidemia, Hyperlipidemia associated with type 2 diabetes mellitus ([place]), Iron deficiency anemia, unspecified iron deficiency anemia type, and Benign essential hypertension were also  pertinent to this visit.Marland Kitchen  Please note: voice recognition software was used to produce this document, and typos may escape review. Please contact Dr. Sheppard Coil for any needed clarifications.    Follow up plan: Return for See me Friday or Monday to recheck breathing and swelling .

## 2019-03-18 LAB — CBC
HCT: 32.9 % — ABNORMAL LOW (ref 38.5–50.0)
Hemoglobin: 11.1 g/dL — ABNORMAL LOW (ref 13.2–17.1)
MCH: 32.9 pg (ref 27.0–33.0)
MCHC: 33.7 g/dL (ref 32.0–36.0)
MCV: 97.6 fL (ref 80.0–100.0)
MPV: 12.3 fL (ref 7.5–12.5)
Platelets: 103 10*3/uL — ABNORMAL LOW (ref 140–400)
RBC: 3.37 10*6/uL — ABNORMAL LOW (ref 4.20–5.80)
RDW: 14 % (ref 11.0–15.0)
WBC: 5.9 10*3/uL (ref 3.8–10.8)

## 2019-03-18 LAB — LIPID PANEL
Cholesterol: 132 mg/dL (ref <200)
HDL: 58 mg/dL (ref >=40)
LDL Cholesterol (Calc): 59 mg/dL (calc)
Non-HDL Cholesterol (Calc): 74 mg/dL (calc) (ref <130)
Total CHOL/HDL Ratio: 2.3 (calc) (ref <5.0)
Triglycerides: 69 mg/dL (ref <150)

## 2019-03-18 LAB — COMPLETE METABOLIC PANEL WITH GFR
AG Ratio: 1.2 (calc) (ref 1.0–2.5)
ALT: 14 U/L (ref 9–46)
AST: 22 U/L (ref 10–35)
Albumin: 3.6 g/dL (ref 3.6–5.1)
Alkaline phosphatase (APISO): 122 U/L (ref 35–144)
BUN/Creatinine Ratio: 29 (calc) — ABNORMAL HIGH (ref 6–22)
BUN: 19 mg/dL (ref 7–25)
CO2: 26 mmol/L (ref 20–32)
Calcium: 9.2 mg/dL (ref 8.6–10.3)
Chloride: 103 mmol/L (ref 98–110)
Creat: 0.66 mg/dL — ABNORMAL LOW (ref 0.70–1.18)
GFR, Est African American: 107 mL/min/{1.73_m2} (ref >=60)
GFR, Est Non African American: 93 mL/min/{1.73_m2} (ref >=60)
Globulin: 2.9 g/dL (calc) (ref 1.9–3.7)
Glucose, Bld: 126 mg/dL — ABNORMAL HIGH (ref 65–99)
Potassium: 4 mmol/L (ref 3.5–5.3)
Sodium: 140 mmol/L (ref 135–146)
Total Bilirubin: 0.8 mg/dL (ref 0.2–1.2)
Total Protein: 6.5 g/dL (ref 6.1–8.1)

## 2019-03-18 LAB — BRAIN NATRIURETIC PEPTIDE: Brain Natriuretic Peptide: 381 pg/mL — ABNORMAL HIGH (ref <100)

## 2019-03-18 LAB — IRON,TIBC AND FERRITIN PANEL
%SAT: 13 % (calc) — ABNORMAL LOW (ref 20–48)
Ferritin: 27 ng/mL (ref 24–380)
Iron: 56 ug/dL (ref 50–180)
TIBC: 434 mcg/dL (calc) — ABNORMAL HIGH (ref 250–425)

## 2019-03-18 LAB — VITAMIN B12: Vitamin B-12: 554 pg/mL (ref 200–1100)

## 2019-03-19 ENCOUNTER — Ambulatory Visit (INDEPENDENT_AMBULATORY_CARE_PROVIDER_SITE_OTHER): Payer: Medicare Other | Admitting: Osteopathic Medicine

## 2019-03-19 ENCOUNTER — Encounter: Payer: Self-pay | Admitting: Osteopathic Medicine

## 2019-03-19 ENCOUNTER — Other Ambulatory Visit: Payer: Self-pay

## 2019-03-19 VITALS — BP 120/68 | HR 71 | Temp 98.4°F | Wt 135.9 lb

## 2019-03-19 DIAGNOSIS — I509 Heart failure, unspecified: Secondary | ICD-10-CM

## 2019-03-19 MED ORDER — FUROSEMIDE 40 MG PO TABS: ORAL_TABLET | ORAL | 1 refills | Status: DC

## 2019-03-19 NOTE — Progress Notes (Signed)
HPI: Garrett Hughes is a 78 y.o. male who  has a past medical history of A-fib ([place]) (12/20/2015), Aortic stenosis, mild (01/01/2017), Atherosclerosis of native coronary artery of native heart without angina pectoris (01/05/2016), Benign essential hypertension (01/05/2016), Cataract, nuclear, right (05/06/2014), Current use of long term anticoagulation (12/20/2015), Diabetic kidney disease ([place]) (01/05/2016), GERD (gastroesophageal reflux disease), History of Barrett's esophagus (12/20/2015), CABG (07/03/2016), Hyperlipemia (10/26/2015), Low back pain (05/02/2016), and Type 2 diabetes mellitus with kidney complication, without long-term current use of insulin ([place]) (07/03/2016).  he presents to [facility] today, 03/19/19,  for chief complaint of:  Follow-up heart failure   Presented 2 days ago with LE swelling, orthopnea, SOB on exertion, exam (+)rales and LE edema. Pt was started on Lasix. BNP slightly above normal and CXR showed mild infiltrates.   Here today to recheck. Reports swelling has improved, he is breathing better. Lost weight. Sleeping normally. Phebe Colla!    BP Readings from Last 3 Encounters:  03/19/19 120/68  03/17/19 (!) 149/68  10/12/18 130/70   Wt Readings from Last 3 Encounters:  03/19/19 135 lb 14.4 oz (61.6 kg)  03/17/19 146 lb 6.4 oz (66.4 kg)  10/12/18 145 lb (65.8 kg)     Wife is here to help with history-taking.       At today's visit 03/19/19 ... PMH, PSH, FH reviewed and updated as needed.  Current medication list and allergy/intolerance hx reviewed and updated as needed. (See remainder of HPI, ROS, Phys Exam below)   Dg Chest 2 View  Result Date: 03/17/2019 CLINICAL DATA:  Severe hypertension. EXAM: CHEST - 2 VIEW COMPARISON:  December 06, 2016 FINDINGS: Increased interstitial markings bilaterally, particularly in the bases. The heart, hila, mediastinum, lungs, and pleura otherwise unremarkable. IMPRESSION: Increased interstitial  markings in the lungs may represent pulmonary venous congestion/mild edema or atypical infection. Recommend follow-up to resolution. Electronically Signed   By: Dorise Bullion III M.D   On: 03/17/2019 14:40    Results for orders placed or performed in visit on 03/17/19 (from the past 72 hour(s))  POCT HgB A1C     Status: Abnormal   Collection Time: 03/17/19  9:20 AM  Result Value Ref Range   Hemoglobin A1C 6.5 (A) 4.0 - 5.6 %   HbA1c POC (<> result, manual entry)     HbA1c, POC (prediabetic range)     HbA1c, POC (controlled diabetic range)    CBC     Status: Abnormal   Collection Time: 03/17/19  9:44 AM  Result Value Ref Range   WBC 5.9 3.8 - 10.8 Thousand/uL   RBC 3.37 (L) 4.20 - 5.80 Million/uL   Hemoglobin 11.1 (L) 13.2 - 17.1 g/dL   HCT 32.9 (L) 38.5 - 50.0 %   MCV 97.6 80.0 - 100.0 fL   MCH 32.9 27.0 - 33.0 pg   MCHC 33.7 32.0 - 36.0 g/dL   RDW 14.0 11.0 - 15.0 %   Platelets 103 (L) 140 - 400 Thousand/uL   MPV 12.3 7.5 - 12.5 fL  COMPLETE METABOLIC PANEL WITH GFR     Status: Abnormal   Collection Time: 03/17/19  9:44 AM  Result Value Ref Range   Glucose, Bld 126 (H) 65 - 99 mg/dL    Comment: .            Fasting reference interval . For someone without known diabetes, a glucose value >125 mg/dL indicates that they may have diabetes and this should be confirmed with a  follow-up test. .    BUN 19 7 - 25 mg/dL   Creat 0.66 (L) 0.70 - 1.18 mg/dL    Comment: For patients >3 years of age, the reference limit for Creatinine is approximately 13% higher for people identified as African-American. .    GFR, Est Non African American 93 > OR = 60 mL/min/1.48m   GFR, Est African American 107 > OR = 60 mL/min/1.79m  BUN/Creatinine Ratio 29 (H) 6 - 22 (calc)   Sodium 140 135 - 146 mmol/L   Potassium 4.0 3.5 - 5.3 mmol/L   Chloride 103 98 - 110 mmol/L   CO2 26 20 - 32 mmol/L   Calcium 9.2 8.6 - 10.3 mg/dL   Total Protein 6.5 6.1 - 8.1 g/dL   Albumin 3.6 3.6 - 5.1 g/dL    Globulin 2.9 1.9 - 3.7 g/dL (calc)   AG Ratio 1.2 1.0 - 2.5 (calc)   Total Bilirubin 0.8 0.2 - 1.2 mg/dL   Alkaline phosphatase (APISO) 122 35 - 144 U/L   AST 22 10 - 35 U/L   ALT 14 9 - 46 U/L  Lipid panel     Status: None   Collection Time: 03/17/19  9:44 AM  Result Value Ref Range   Cholesterol 132 <200 mg/dL   HDL 58 > OR = 40 mg/dL   Triglycerides 69 <150 mg/dL   LDL Cholesterol (Calc) 59 mg/dL (calc)    Comment: Reference range: <100 . Desirable range <100 mg/dL for primary prevention;   <70 mg/dL for patients with CHD or diabetic patients  with > or = 2 CHD risk factors. . Marland KitchenDL-C is now calculated using the Martin-Hopkins  calculation, which is a validated novel method providing  better accuracy than the Friedewald equation in the  estimation of LDL-C.  MaCresenciano Genret al. JAAnnamaria [id number];016(55 [id number]  (http://education.[employer].com/faq/FAQ164)    Total CHOL/HDL Ratio 2.3 <5.0 (calc)   Non-HDL Cholesterol (Calc) 74 <130 mg/dL (calc)    Comment: For patients with diabetes plus 1 major ASCVD risk  factor, treating to a non-HDL-C goal of <100 mg/dL  (LDL-C of <70 mg/dL) is considered a therapeutic  option.   Fe+TIBC+Fer     Status: Abnormal   Collection Time: 03/17/19  9:44 AM  Result Value Ref Range   Iron 56 50 - 180 mcg/dL   TIBC 434 (H) 250 - 425 mcg/dL (calc)   %SAT 13 (L) 20 - 48 % (calc)   Ferritin 27 24 - 380 ng/mL  Vitamin B12     Status: None   Collection Time: 03/17/19  9:44 AM  Result Value Ref Range   Vitamin B-12 554 200 - 1,100 pg/mL  B Nat Peptide     Status: Abnormal   Collection Time: 03/17/19  9:51 AM  Result Value Ref Range   Brain Natriuretic Peptide 381 (H) <100 pg/mL    Comment: . BNP levels increase with age in the general population with the highest values seen in individuals greater than 7524ears of age. Reference: J. Am. Co[place] ArCardiol. 2002; ; 37:482-707.           ASSESSMENT/PLAN: The encounter diagnosis was  Congestive heart failure, unspecified HF chronicity, unspecified heart failure type ([place]    Orders Placed This Encounter  Procedures  . BASIC METABOLIC PANEL WITH GFR     Meds ordered this encounter  Medications  . furosemide (LASIX) 40 MG tablet    Sig: Maintenance: take 1 tablet / 40 mg po daily.  If increased swelling or shortness of breath: take 1 tablet / 40 mg po bid    Dispense:  180 tablet    Refill:  1    Patient Instructions  Would take the Lasix / furosemide once daily for maintenance to prevent fluid buildup. Can increase to twice daily if you notice increased swelling or trouble breathing. Weigh yourself daily and let me know if your weight goes up by 5 lbs over 2 days!         Follow-up plan: Return in 6 days (on 03/25/2019) for LAB VISIT ONLY to recheck kidney function on the medicines .                                                 ################################################# ################################################# ################################################# #################################################    Current Meds  Medication Sig  . ACCU-CHEK SOFTCLIX LANCETS lancets Use as instructed to check blood sugar up to qid. Dx Code E11.22.  Marland Kitchen Alcohol Swabs PADS Use as instructed to check blood sugar up to qid. Dx code E11.22  . AMBULATORY NON FORMULARY MEDICATION Medication Name: accu-chek aviva plus meter, accu-check aviva plus test strips & accu-chek softclix lancets to test twice a day. Dx: E11.29  . aspirin 81 MG chewable tablet Chew by mouth daily.  . Blood Glucose Calibration (ACCU-CHEK AVIVA) SOLN Use as instructed per manufacturer directions. Dx code E11.22  . Blood Glucose Monitoring Suppl (ACCU-CHEK AVIVA PLUS) w/Device KIT Use as instructed to check blood sugar up to qid. Dx code E11.22  . clopidogrel (PLAVIX) 75 MG tablet TAKE 1 TABLET BY MOUTH  DAILY  . doxazosin (CARDURA) 8  MG tablet Take 1 tablet (8 mg total) by mouth daily.  . ferrous sulfate 325 (65 FE) MG EC tablet Take 1 tablet (325 mg total) by mouth daily with breakfast.  . furosemide (LASIX) 40 MG tablet Maintenance: take 1 tablet / 40 mg po daily. If increased swelling or shortness of breath: take 1 tablet / 40 mg po bid  . glipiZIDE (GLUCOTROL) 5 MG tablet TAKE 1 TABLET TWICE DAILY BEFORE MEALS.  . glucose blood (ACCU-CHEK AVIVA PLUS) test strip Use as instructed to check blood sugar up to qid. Dx Code E11.22.  Marland Kitchen Lancets (ONETOUCH ULTRASOFT) lancets Use as instructed to check blood sugar up to qid. Dx: E11.22  . lisinopril (PRINIVIL,ZESTRIL) 5 MG tablet TAKE 1 TABLET BY MOUTH  DAILY  . metFORMIN (GLUCOPHAGE) 1000 MG tablet TAKE 1 TABLET TWICE DAILY WITH A MEAL.  . metoprolol succinate (TOPROL-XL) 25 MG 24 hr tablet Take 1 tablet (25 mg total) by mouth daily. Pt needs F/U appt w/PCP for further refills.  . nitroGLYCERIN (NITROSTAT) 0.4 MG SL tablet Place 1 tablet (0.4 mg total) under the tongue every 5 (five) minutes as needed for chest pain.  . pantoprazole (PROTONIX) 20 MG tablet Take 1 tablet (20 mg total) by mouth daily.  . pravastatin (PRAVACHOL) 40 MG tablet Take 1 tablet (40 mg total) by mouth daily.  . [DISCONTINUED] furosemide (LASIX) 40 MG tablet Take 1 tablet (40 mg total) by mouth 2 (two) times daily.    Allergies  Allergen Reactions  . Atorvastatin     Other reaction(s): Myalgias (intolerance)  . Cholestyramine Other (See Comments)    Constipation  . Pravastatin Other (See Comments)       Review of Systems:  Constitutional:  No recent illness  HEENT: No  headache, no vision change  Cardiac: No  chest pain, No  pressure, No palpitations  Respiratory:  No  shortness of breath. No  Cough  Musculoskeletal: No new myalgia/arthralgia  Neurologic: No  weakness, No  Dizziness   Exam:  BP 120/68 (BP Location: Left Arm, Patient Position: Sitting, Cuff Size: Normal)   Pulse 71    Temp 98.4 F (36.9 C) (Oral)   Wt 135 lb 14.4 oz (61.6 kg)   BMI 23.33 kg/m   Constitutional: VS see above. General Appearance: alert, well-developed, well-nourished, NAD  Eyes: Normal lids and conjunctive, non-icteric sclera  Ears, Nose, Mouth, Throat: MMM, Normal external inspection ears/nares/mouth/lips/gums.  Neck: No masses, trachea midline.   Respiratory: Normal respiratory effort. no wheeze, no rhonchi, very faint rales - improved from previous exam   Cardiovascular: S1/S2 normal, + murmur, no rub/gallop auscultated. irreg/irreg.   Musculoskeletal: Gait normal. Symmetric and independent movement of all extremities  Neurological: Normal balance/coordination. No tremor.  Skin: warm, dry, intact.   Psychiatric: Normal judgment/insight. Normal mood and affect. Oriented x3.       Visit summary with medication list and pertinent instructions was printed for patient to review, patient was advised to alert [place] if any updates are needed. All questions at time of visit were answered - patient instructed to contact office with any additional concerns. ER/RTC precautions were reviewed with the patient and understanding verbalized.    Please note: voice recognition software was used to produce this document, and typos may escape review. Please contact Dr. Sheppard Coil for any needed clarifications.    Follow up plan: Return in 6 days (on 03/25/2019) for LAB VISIT ONLY to recheck kidney function on the medicines .

## 2019-03-19 NOTE — Patient Instructions (Addendum)
Would take the Lasix / furosemide once daily for maintenance to prevent fluid buildup. Can increase to twice daily if you notice increased swelling or trouble breathing. Weigh yourself daily and let me know if your weight goes up by 5 lbs over 2 days!

## 2019-03-25 DIAGNOSIS — I509 Heart failure, unspecified: Secondary | ICD-10-CM | POA: Diagnosis not present

## 2019-03-26 DIAGNOSIS — I509 Heart failure, unspecified: Secondary | ICD-10-CM | POA: Insufficient documentation

## 2019-03-26 LAB — BASIC METABOLIC PANEL WITH GFR
BUN/Creatinine Ratio: 30 (calc) — ABNORMAL HIGH (ref 6–22)
BUN: 27 mg/dL — ABNORMAL HIGH (ref 7–25)
CO2: 26 mmol/L (ref 20–32)
Calcium: 8.3 mg/dL — ABNORMAL LOW (ref 8.6–10.3)
Chloride: 100 mmol/L (ref 98–110)
Creat: 0.91 mg/dL (ref 0.70–1.18)
GFR, Est African American: 93 mL/min/{1.73_m2} (ref >=60)
GFR, Est Non African American: 80 mL/min/{1.73_m2} (ref >=60)
Glucose, Bld: 228 mg/dL — ABNORMAL HIGH (ref 65–99)
Potassium: 4.3 mmol/L (ref 3.5–5.3)
Sodium: 138 mmol/L (ref 135–146)

## 2019-03-26 NOTE — Addendum Note (Signed)
Addended by: Maryla Morrow on: 03/26/2019 09:33 AM   Modules accepted: Orders

## 2019-04-02 DIAGNOSIS — I509 Heart failure, unspecified: Secondary | ICD-10-CM | POA: Diagnosis not present

## 2019-04-03 ENCOUNTER — Other Ambulatory Visit: Payer: Self-pay | Admitting: Osteopathic Medicine

## 2019-04-03 LAB — BASIC METABOLIC PANEL WITH GFR
BUN: 25 mg/dL (ref 7–25)
CO2: 28 mmol/L (ref 20–32)
Calcium: 8.5 mg/dL — ABNORMAL LOW (ref 8.6–10.3)
Chloride: 101 mmol/L (ref 98–110)
Creat: 0.92 mg/dL (ref 0.70–1.18)
GFR, Est African American: 92 mL/min/{1.73_m2} (ref >=60)
GFR, Est Non African American: 79 mL/min/{1.73_m2} (ref >=60)
Glucose, Bld: 201 mg/dL — ABNORMAL HIGH (ref 65–99)
Potassium: 4 mmol/L (ref 3.5–5.3)
Sodium: 140 mmol/L (ref 135–146)

## 2019-04-07 ENCOUNTER — Other Ambulatory Visit: Payer: Self-pay

## 2019-04-07 NOTE — Patient Outreach (Signed)
[facility]) Care Management  04/07/2019  TETSUO COPPOLA Aug 28, 1941 [id number]   Medication Adherence call to Mr. Karan Ramnauth HIPPA Compliant Voice message left with a call back number. Mr. Scholz is showing past due on Lisinopril 5 mg under [place].   [facility] Management Direct Dial [phone]  Fax [phone] [email or web address]

## 2019-04-08 NOTE — Addendum Note (Signed)
Addended by: Maryla Morrow on: 04/08/2019 04:52 PM   Modules accepted: Orders

## 2019-05-18 ENCOUNTER — Ambulatory Visit (INDEPENDENT_AMBULATORY_CARE_PROVIDER_SITE_OTHER): Payer: Medicare Other | Admitting: Osteopathic Medicine

## 2019-05-18 ENCOUNTER — Other Ambulatory Visit: Payer: Self-pay

## 2019-05-18 ENCOUNTER — Encounter: Payer: Self-pay | Admitting: Osteopathic Medicine

## 2019-05-18 VITALS — BP 140/84 | HR 54 | Temp 98.6°F | Wt 137.9 lb

## 2019-05-18 DIAGNOSIS — I10 Essential (primary) hypertension: Secondary | ICD-10-CM

## 2019-05-18 DIAGNOSIS — I509 Heart failure, unspecified: Secondary | ICD-10-CM

## 2019-05-18 DIAGNOSIS — Z23 Encounter for immunization: Secondary | ICD-10-CM

## 2019-05-18 NOTE — Progress Notes (Signed)
HPI: Garrett Hughes is a 78 y.o. male who  has a past medical history of A-fib ([place]) (12/20/2015), Aortic stenosis, mild (01/01/2017), Atherosclerosis of native coronary artery of native heart without angina pectoris (01/05/2016), Benign essential hypertension (01/05/2016), Cataract, nuclear, right (05/06/2014), Current use of long term anticoagulation (12/20/2015), Diabetic kidney disease ([place]) (01/05/2016), GERD (gastroesophageal reflux disease), History of Barrett's esophagus (12/20/2015), CABG (07/03/2016), Hyperlipemia (10/26/2015), Low back pain (05/02/2016), and Type 2 diabetes mellitus with kidney complication, without long-term current use of insulin ([place]) (07/03/2016).  he presents to [facility] today, 05/18/19,  for chief complaint of:  Follow up heart failure, hypocalcemia New issue leg cramps  Doing well, staying on Lasix, no LE swelling, no SOB, no CP. No dizziness or falls. Occasional leg cramps bilateral lower extremities maybe once a week or so. Ca++ was a bit low last check, due to follow-up   Immunization History  Administered Date(s) Administered  . Fluad Quad(high Dose 65+) 05/18/2019  . Influenza, High Dose Seasonal PF 09/14/2018  . Influenza,inj,Quad PF,6+ Mos 05/06/2017  . Tdap 09/14/2018     At today's visit 05/18/19 ... PMH, PSH, FH reviewed and updated as needed.  Current medication list and allergy/intolerance hx reviewed and updated as needed. (See remainder of HPI, ROS, Phys Exam below)   No results found.  No results found for this or any previous visit (from the past 72 hour(s)).        ASSESSMENT/PLAN: The primary encounter diagnosis was Congestive heart failure, unspecified HF chronicity, unspecified heart failure type ([place]). Diagnoses of Need for influenza vaccination, Hypocalcemia, and Benign essential hypertension were also pertinent to this visit.   Orders Placed This Encounter  Procedures  . Flu Vaccine QUAD High  Dose(Fluad)   Pt feels confident he had the pneumonia vaccine series years ago.   BMP today    No orders of the defined types were placed in this encounter.   There are no Patient Instructions on file for this visit.    Follow-up plan: Return in about 4 months (around 09/17/2019) for follow up sugars, blood pressure. See me sooner if needed / based on labs .                                                 ################################################# ################################################# ################################################# #################################################    Current Meds  Medication Sig  . ACCU-CHEK SOFTCLIX LANCETS lancets Use as instructed to check blood sugar up to qid. Dx Code E11.22.  Marland Kitchen Alcohol Swabs PADS Use as instructed to check blood sugar up to qid. Dx code E11.22  . AMBULATORY NON FORMULARY MEDICATION Medication Name: accu-chek aviva plus meter, accu-check aviva plus test strips & accu-chek softclix lancets to test twice a day. Dx: E11.29  . aspirin 81 MG chewable tablet Chew by mouth daily.  . Blood Glucose Calibration (ACCU-CHEK AVIVA) SOLN Use as instructed per manufacturer directions. Dx code E11.22  . Blood Glucose Monitoring Suppl (ACCU-CHEK AVIVA PLUS) w/Device KIT Use as instructed to check blood sugar up to qid. Dx code E11.22  . clopidogrel (PLAVIX) 75 MG tablet TAKE 1 TABLET BY MOUTH  DAILY  . doxazosin (CARDURA) 8 MG tablet Take 1 tablet (8 mg total) by mouth daily.  . ferrous sulfate 325 (65 FE) MG EC tablet Take 1 tablet (325 mg total) by mouth daily with  breakfast.  . furosemide (LASIX) 40 MG tablet Maintenance: take 1 tablet / 40 mg po daily. If increased swelling or shortness of breath: take 1 tablet / 40 mg po bid  . glipiZIDE (GLUCOTROL) 5 MG tablet TAKE 1 TABLET TWICE DAILY BEFORE MEALS.  . glucose blood (ACCU-CHEK AVIVA PLUS) test strip Use as instructed to  check blood sugar up to qid. Dx Code E11.22.  Marland Kitchen Lancets (ONETOUCH ULTRASOFT) lancets Use as instructed to check blood sugar up to qid. Dx: E11.22  . lisinopril (ZESTRIL) 5 MG tablet TAKE 1 TABLET BY MOUTH  DAILY  . metFORMIN (GLUCOPHAGE) 1000 MG tablet TAKE 1 TABLET TWICE DAILY WITH A MEAL.  . metoprolol succinate (TOPROL-XL) 25 MG 24 hr tablet Take 1 tablet (25 mg total) by mouth daily. Pt needs F/U appt w/PCP for further refills.  . nitroGLYCERIN (NITROSTAT) 0.4 MG SL tablet Place 1 tablet (0.4 mg total) under the tongue every 5 (five) minutes as needed for chest pain.  . pantoprazole (PROTONIX) 20 MG tablet Take 1 tablet (20 mg total) by mouth daily.  . pravastatin (PRAVACHOL) 40 MG tablet Take 1 tablet (40 mg total) by mouth daily.    Allergies  Allergen Reactions  . Atorvastatin     Other reaction(s): Myalgias (intolerance)  . Cholestyramine Other (See Comments)    Constipation  . Pravastatin Other (See Comments)       Review of Systems:  Constitutional: No recent illness  HEENT: No  headache, no vision change  Cardiac: No  chest pain, No  pressure, No palpitations  Respiratory:  No  shortness of breath. No  Cough  Gastrointestinal: No  abdominal pain  Musculoskeletal: No new myalgia/arthralgia  Neurologic: No  weakness, No  Dizziness  Psychiatric: No  concerns with depression, No  concerns with anxiety  Exam:  BP 140/84 (BP Location: Left Arm, Patient Position: Sitting, Cuff Size: Normal)   Pulse (!) 54   Temp 98.6 F (37 C) (Oral)   Wt 137 lb 14.4 oz (62.6 kg)   BMI 23.67 kg/m   Constitutional: VS see above. General Appearance: alert, well-developed, well-nourished, NAD  Eyes: Normal lids and conjunctive, non-icteric sclera  Ears, Nose, Mouth, Throat: MMM, Normal external inspection ears/nares/mouth/lips/gums.  Neck: No masses, trachea midline.   Respiratory: Normal respiratory effort. no wheeze, no rhonchi, no rales  Cardiovascular: S1/S2 normal, +3/5  systolic murmur, no rub/gallop auscultated. RRR.   Musculoskeletal: Gait normal. Symmetric and independent movement of all extremities  Neurological: Normal balance/coordination. No tremor.  Skin: warm, dry, intact.   Psychiatric: Normal judgment/insight. Normal mood and affect. Oriented x3.       Visit summary with medication list and pertinent instructions was printed for patient to review, patient was advised to alert [place] if any updates are needed. All questions at time of visit were answered - patient instructed to contact office with any additional concerns. ER/RTC precautions were reviewed with the patient and understanding verbalized.    Please note: voice recognition software was used to produce this document, and typos may escape review. Please contact Dr. Sheppard Coil for any needed clarifications.    Follow up plan: Return in about 4 months (around 09/17/2019) for follow up sugars, blood pressure. See me sooner if needed / based on labs .

## 2019-05-19 LAB — BASIC METABOLIC PANEL WITH GFR
BUN: 23 mg/dL (ref 7–25)
CO2: 28 mmol/L (ref 20–32)
Calcium: 8.9 mg/dL (ref 8.6–10.3)
Chloride: 101 mmol/L (ref 98–110)
Creat: 1.16 mg/dL (ref 0.70–1.18)
GFR, Est African American: 70 mL/min/{1.73_m2} (ref >=60)
GFR, Est Non African American: 60 mL/min/{1.73_m2} (ref >=60)
Glucose, Bld: 149 mg/dL — ABNORMAL HIGH (ref 65–99)
Potassium: 4.3 mmol/L (ref 3.5–5.3)
Sodium: 141 mmol/L (ref 135–146)

## 2019-06-10 DIAGNOSIS — H524 Presbyopia: Secondary | ICD-10-CM | POA: Diagnosis not present

## 2019-06-10 DIAGNOSIS — E119 Type 2 diabetes mellitus without complications: Secondary | ICD-10-CM | POA: Diagnosis not present

## 2019-06-14 ENCOUNTER — Telehealth: Payer: Self-pay

## 2019-06-14 NOTE — Telephone Encounter (Signed)
[facility] pharmacy requesting a rx for verio one touch test strip. *Test up to 4 times daily as directed.* Rx not listed in active med list. Pls advise, thanks.

## 2019-06-15 MED ORDER — ACCU-CHEK AVIVA PLUS VI STRP: ORAL_STRIP | 99 refills | Status: DC

## 2019-06-18 ENCOUNTER — Other Ambulatory Visit: Payer: Self-pay | Admitting: Osteopathic Medicine

## 2019-06-18 NOTE — Telephone Encounter (Signed)
Forwarding medication refill request to the clinical pool for review. 

## 2019-07-30 ENCOUNTER — Other Ambulatory Visit: Payer: Self-pay | Admitting: Osteopathic Medicine

## 2019-07-30 NOTE — Telephone Encounter (Signed)
Forwarding medication refill requests to the clinical pool for review. 

## 2019-08-04 ENCOUNTER — Other Ambulatory Visit: Payer: Self-pay | Admitting: Osteopathic Medicine

## 2019-08-04 NOTE — Telephone Encounter (Signed)
Requested medication (s) are due for refill today: no  Requested medication (s) are on the active medication list: yes  Last refill:  06/16/2019  Future visit scheduled: yes  Notes to clinic:  Requesting a 1 year supply   Requested Prescriptions  Pending Prescriptions Disp Refills   furosemide (LASIX) 40 MG tablet [Pharmacy Med Name: FUROSEMIDE  40MG   TAB] 180 tablet 3    Sig: MAINTENANCE: TAKE 1 TABLET  BY MOUTH DAILY. IF  INCREASED SWELLING OR  SHORTNESS OF BREATH: 1 TAB  BY MOUTH TWICE DAILY     Cardiovascular:  Diuretics - Loop Failed - 08/04/2019  7:22 AM      Failed - Last BP in normal range    BP Readings from Last 1 Encounters:  05/18/19 140/84         Failed - Valid encounter within last 6 months    Recent Outpatient Visits          2 months ago Congestive heart failure, unspecified HF chronicity, unspecified heart failure type [facility])   [facility] [facility], Lanelle Bal, DO   4 months ago Congestive heart failure, unspecified HF chronicity, unspecified heart failure type [facility])   [place] [facility], Lanelle Bal, DO   4 months ago Acute systolic heart failure [facility])   [place] [facility], Lanelle Bal, DO   10 months ago Type 2 diabetes mellitus with chronic kidney disease, without long-term current use of insulin, unspecified CKD stage [facility])   [place] [facility], Lanelle Bal, DO   1 year ago Type 2 diabetes mellitus with chronic kidney disease, without long-term current use of insulin, unspecified CKD stage [facility])    [facility], DO      Future Appointments            In 2 months [place], Denice Bors, MD [place]   In 2 months  [facility] - K in normal range and within 360 days    Potassium  Date Value Ref  Range Status  05/18/2019 4.3 3.5 - 5.3 mmol/L Final         Passed - Ca in normal range and within 360 days    Calcium  Date Value Ref Range Status  05/18/2019 8.9 8.6 - 10.3 mg/dL Final         Passed - Na in normal range and within 360 days    Sodium  Date Value Ref Range Status  05/18/2019 141 135 - 146 mmol/L Final         Passed - Cr in normal range and within 360 days    Creat  Date Value Ref Range Status  05/18/2019 1.16 0.70 - 1.18 mg/dL Final    Comment:    For patients >55 years of age, the reference limit for Creatinine is approximately 13% higher for people identified as African-American. Marland Kitchen

## 2019-09-17 ENCOUNTER — Ambulatory Visit: Payer: Medicare Other | Admitting: Osteopathic Medicine

## 2019-09-21 ENCOUNTER — Other Ambulatory Visit: Payer: Self-pay

## 2019-09-21 ENCOUNTER — Encounter: Payer: Self-pay | Admitting: Osteopathic Medicine

## 2019-09-21 ENCOUNTER — Ambulatory Visit (INDEPENDENT_AMBULATORY_CARE_PROVIDER_SITE_OTHER): Payer: Medicare Other | Admitting: Osteopathic Medicine

## 2019-09-21 VITALS — BP 137/86 | HR 74 | Temp 98.3°F | Wt 139.0 lb

## 2019-09-21 DIAGNOSIS — D649 Anemia, unspecified: Secondary | ICD-10-CM | POA: Diagnosis not present

## 2019-09-21 DIAGNOSIS — E1122 Type 2 diabetes mellitus with diabetic chronic kidney disease: Secondary | ICD-10-CM | POA: Diagnosis not present

## 2019-09-21 DIAGNOSIS — I4811 Longstanding persistent atrial fibrillation: Secondary | ICD-10-CM | POA: Diagnosis not present

## 2019-09-21 LAB — POCT GLYCOSYLATED HEMOGLOBIN (HGB A1C): Hemoglobin A1C: 7 % — AB (ref 4.0–5.6)

## 2019-09-21 NOTE — Progress Notes (Signed)
HPI: Garrett Hughes is a 79 y.o. male who  has a past medical history of A-fib ([place]) (12/20/2015), Aortic stenosis, mild (01/01/2017), Atherosclerosis of native coronary artery of native heart without angina pectoris (01/05/2016), Benign essential hypertension (01/05/2016), Cataract, nuclear, right (05/06/2014), Current use of long term anticoagulation (12/20/2015), Diabetic kidney disease ([place]) (01/05/2016), GERD (gastroesophageal reflux disease), History of Barrett's esophagus (12/20/2015), CABG (07/03/2016), Hyperlipemia (10/26/2015), Low back pain (05/02/2016), and Type 2 diabetes mellitus with kidney complication, without long-term current use of insulin ([place]) (07/03/2016).  he presents to [facility] today, 09/21/19,  for chief complaint of:  F/u Diabetes, blood pressure  No other complaints or concerns today, patient is feeling well.  DIABETES SCREENING/PREVENTIVE CARE: A1C past 3-6 mos: Yes  controlled? Yes   03/17/2019: 6.5  Today 09/21/19: 7.0 on glipizide 5 mg twice daily, Metformin 1000 mg twice daily, BP goal <130/80: Yes  LDL goal <70: Yes  Eye exam annually: Yes , importance discussed with patient Foot exam: No  Microalbuminuria:n/a Metformin: Yes  ACE/ARB: Yes  Antiplatelet if ASCVD Risk >10%: Yes  Statin: Yes  Pneumovax: none on file, patient believes he had this       At today's visit 09/21/19 ... PMH, PSH, FH reviewed and updated as needed.  Current medication list and allergy/intolerance hx reviewed and updated as needed. (See remainder of HPI, ROS, Phys Exam below)   No results found.  Results for orders placed or performed in visit on 09/21/19 (from the past 72 hour(s))  POCT HgB A1C     Status: Abnormal   Collection Time: 09/21/19  8:49 AM  Result Value Ref Range   Hemoglobin A1C 7.0 (A) 4.0 - 5.6 %   HbA1c POC (<> result, manual entry)     HbA1c, POC (prediabetic range)     HbA1c, POC (controlled diabetic range)        Reviewed labs from 05/18/2019, BMP was okay, ordered by Dr. Stanford Breed (cardiology) for hypocalcemia follow-up     ASSESSMENT/PLAN: The primary encounter diagnosis was Type 2 diabetes mellitus with chronic kidney disease, without long-term current use of insulin, unspecified CKD stage ([place]). A diagnosis of Longstanding persistent atrial fibrillation (HCC) was also pertinent to this visit.   No hypoglycemia, okay to continue current meds but we might think about backing off on the glipizide if A1c drops much lower.  Afib and other cardiac issues per cardiology.  Patient has no concerning cardiac symptoms today.  Orders Placed This Encounter  Procedures  . CBC  . COMPLETE METABOLIC PANEL WITH GFR  . LIPID SCREENING  . MAG  . Vitamin B12  . POCT HgB A1C        Follow-up plan: Return in about 4 months (around 01/19/2020) for in office recheck sugars - see me sooner if needed! .                                                 ################################################# ################################################# ################################################# #################################################    Current Meds  Medication Sig  . ACCU-CHEK SOFTCLIX LANCETS lancets Use as instructed to check blood sugar up to qid. Dx Code E11.22.  Marland Kitchen Alcohol Swabs PADS Use as instructed to check blood sugar up to qid. Dx code E11.22  . AMBULATORY NON FORMULARY MEDICATION Medication Name: accu-chek aviva plus meter, accu-check aviva plus test strips &  accu-chek softclix lancets to test twice a day. Dx: E11.29  . aspirin 81 MG chewable tablet Chew by mouth daily.  . Blood Glucose Calibration (ACCU-CHEK AVIVA) SOLN Use as instructed per manufacturer directions. Dx code E11.22  . Blood Glucose Monitoring Suppl (ACCU-CHEK AVIVA PLUS) w/Device KIT Use as instructed to check blood sugar up to qid. Dx code E11.22  . clopidogrel  (PLAVIX) 75 MG tablet TAKE 1 TABLET BY MOUTH  DAILY  . doxazosin (CARDURA) 8 MG tablet TAKE 1 TABLET BY MOUTH  DAILY  . ferrous sulfate 325 (65 FE) MG EC tablet Take 1 tablet (325 mg total) by mouth daily with breakfast.  . furosemide (LASIX) 40 MG tablet MAINTENANCE: TAKE 1 TABLET  BY MOUTH DAILY. IF  INCREASED SWELLING OR  SHORTNESS OF BREATH: 1 TAB  BY MOUTH TWICE DAILY  . glipiZIDE (GLUCOTROL) 5 MG tablet TAKE 1 TABLET BY MOUTH  TWICE A DAY BEFORE MEALS  . glucose blood (ACCU-CHEK AVIVA PLUS) test strip Use as instructed to check blood sugar up to qid. Dx Code E11.22. Disp generic per pt preference / insurance coverage  . Lancets (ONETOUCH ULTRASOFT) lancets Use as instructed to check blood sugar up to qid. Dx: E11.22  . lisinopril (ZESTRIL) 5 MG tablet TAKE 1 TABLET BY MOUTH  DAILY  . metFORMIN (GLUCOPHAGE) 1000 MG tablet TAKE 1 TABLET BY MOUTH  TWICE DAILY WITH A MEAL  . metoprolol succinate (TOPROL-XL) 25 MG 24 hr tablet TAKE 1 TABLET BY MOUTH  DAILY  . nitroGLYCERIN (NITROSTAT) 0.4 MG SL tablet Place 1 tablet (0.4 mg total) under the tongue every 5 (five) minutes as needed for chest pain.  . pantoprazole (PROTONIX) 20 MG tablet TAKE 1 TABLET BY MOUTH  DAILY  . pravastatin (PRAVACHOL) 40 MG tablet TAKE 1 TABLET BY MOUTH  DAILY    Allergies  Allergen Reactions  . Atorvastatin     Other reaction(s): Myalgias (intolerance)  . Cholestyramine Other (See Comments)    Constipation  . Pravastatin Other (See Comments)       Review of Systems:  Constitutional: No recent illness  Cardiac: No  chest pain, No  pressure, No palpitations  Respiratory:  No  shortness of breath. No  Cough  Neurologic: No  weakness, No  Dizziness   Exam:  BP 137/86 (BP Location: Left Arm, Patient Position: Sitting, Cuff Size: Normal)   Pulse 74   Temp 98.3 F (36.8 C) (Oral)   Wt 139 lb 0.6 oz (63.1 kg)   BMI 23.87 kg/m   Constitutional: VS see above. General Appearance: alert, well-developed,  well-nourished, NAD  Neck: No masses, trachea midline.   Respiratory: Normal respiratory effort. no wheeze, no rhonchi, no rales  Cardiovascular: S1/S2 normal, +1/1 systolic murmur, no rub/gallop auscultated. irreg/irreg. No LE edema  Musculoskeletal: Gait normal.  Psychiatric: Normal judgment/insight. Normal mood and affect. Oriented x3.       Visit summary with medication list and pertinent instructions was printed for patient to review, patient was advised to alert [place] if any updates are needed. All questions at time of visit were answered - patient instructed to contact office with any additional concerns. ER/RTC precautions were reviewed with the patient and understanding verbalized.    Please note: voice recognition software was used to produce this document, and typos may escape review. Please contact Dr. Sheppard Coil for any needed clarifications.    Follow up plan: Return in about 4 months (around 01/19/2020) for in office recheck sugars - see me sooner if  needed! Marland Kitchen

## 2019-09-22 LAB — CBC
HCT: 35 % — ABNORMAL LOW (ref 38.5–50.0)
Hemoglobin: 11.7 g/dL — ABNORMAL LOW (ref 13.2–17.1)
MCH: 33.1 pg — ABNORMAL HIGH (ref 27.0–33.0)
MCHC: 33.4 g/dL (ref 32.0–36.0)
MCV: 99.2 fL (ref 80.0–100.0)
MPV: 11.8 fL (ref 7.5–12.5)
Platelets: 150 10*3/uL (ref 140–400)
RBC: 3.53 10*6/uL — ABNORMAL LOW (ref 4.20–5.80)
RDW: 12.7 % (ref 11.0–15.0)
WBC: 8.2 10*3/uL (ref 3.8–10.8)

## 2019-09-22 LAB — COMPLETE METABOLIC PANEL WITH GFR
AG Ratio: 1.2 (calc) (ref 1.0–2.5)
ALT: 17 U/L (ref 9–46)
AST: 20 U/L (ref 10–35)
Albumin: 3.6 g/dL (ref 3.6–5.1)
Alkaline phosphatase (APISO): 78 U/L (ref 35–144)
BUN/Creatinine Ratio: 25 (calc) — ABNORMAL HIGH (ref 6–22)
BUN: 27 mg/dL — ABNORMAL HIGH (ref 7–25)
CO2: 27 mmol/L (ref 20–32)
Calcium: 9.1 mg/dL (ref 8.6–10.3)
Chloride: 105 mmol/L (ref 98–110)
Creat: 1.08 mg/dL (ref 0.70–1.18)
GFR, Est African American: 76 mL/min/{1.73_m2} (ref >=60)
GFR, Est Non African American: 65 mL/min/{1.73_m2} (ref >=60)
Globulin: 2.9 g/dL (calc) (ref 1.9–3.7)
Glucose, Bld: 145 mg/dL — ABNORMAL HIGH (ref 65–99)
Potassium: 5.1 mmol/L (ref 3.5–5.3)
Sodium: 140 mmol/L (ref 135–146)
Total Bilirubin: 0.4 mg/dL (ref 0.2–1.2)
Total Protein: 6.5 g/dL (ref 6.1–8.1)

## 2019-09-22 LAB — MAGNESIUM: Magnesium: 1.5 mg/dL (ref 1.5–2.5)

## 2019-09-22 LAB — LIPID PANEL
Cholesterol: 145 mg/dL (ref <200)
HDL: 63 mg/dL (ref >=40)
LDL Cholesterol (Calc): 67 mg/dL (calc)
Non-HDL Cholesterol (Calc): 82 mg/dL (calc) (ref <130)
Total CHOL/HDL Ratio: 2.3 (calc) (ref <5.0)
Triglycerides: 71 mg/dL (ref <150)

## 2019-09-22 LAB — VITAMIN B12: Vitamin B-12: 417 pg/mL (ref 200–1100)

## 2019-10-04 NOTE — Progress Notes (Signed)
HPI: FU CAD (s/p [id number]), AS and atrial fibrillation. Not on anticoagulation as pt refused in past and had h/o GI bleed and urinary tract bleeding.Abdominal ultrasound February 2019 showed no aneurysm. Echocardiogram February 2020 showed normal LV systolic function, moderate left ventricular hypertrophy, moderate biatrial enlargement, moderate mitral regurgitation, moderate tricuspid regurgitation, probable moderate aortic stenosis with mean gradient 25 mmHg and mild aortic insufficiency.  Since last seen since last seen,  patient denies increased dyspnea, chest pain, palpitations or syncope.  Current Outpatient Medications  Medication Sig Dispense Refill  . ACCU-CHEK SOFTCLIX LANCETS lancets Use as instructed to check blood sugar up to qid. Dx Code E11.22. 100 each 12  . Alcohol Swabs PADS Use as instructed to check blood sugar up to qid. Dx code E11.22 100 each 1  . AMBULATORY NON FORMULARY MEDICATION Medication Name: accu-chek aviva plus meter, accu-check aviva plus test strips & accu-chek softclix lancets to test twice a day. Dx: E11.29 1 vial PRN  . aspirin 81 MG chewable tablet Chew by mouth daily.    . Blood Glucose Calibration (ACCU-CHEK AVIVA) SOLN Use as instructed per manufacturer directions. Dx code E11.22 1 each 0  . Blood Glucose Monitoring Suppl (ACCU-CHEK AVIVA PLUS) w/Device KIT Use as instructed to check blood sugar up to qid. Dx code E11.22 1 kit 0  . clopidogrel (PLAVIX) 75 MG tablet TAKE 1 TABLET BY MOUTH  DAILY 90 tablet 3  . doxazosin (CARDURA) 8 MG tablet TAKE 1 TABLET BY MOUTH  DAILY 90 tablet 1  . ferrous sulfate 325 (65 FE) MG EC tablet Take 1 tablet (325 mg total) by mouth daily with breakfast. 90 tablet 3  . furosemide (LASIX) 40 MG tablet MAINTENANCE: TAKE 1 TABLET  BY MOUTH DAILY. IF  INCREASED SWELLING OR  SHORTNESS OF BREATH: 1 TAB  BY MOUTH TWICE DAILY 180 tablet 3  . glipiZIDE (GLUCOTROL) 5 MG tablet TAKE 1 TABLET BY MOUTH  TWICE A DAY BEFORE MEALS 180  tablet 1  . glucose blood (ACCU-CHEK AVIVA PLUS) test strip Use as instructed to check blood sugar up to qid. Dx Code E11.22. Disp generic per pt preference / insurance coverage 100 each 99  . Lancets (ONETOUCH ULTRASOFT) lancets Use as instructed to check blood sugar up to qid. Dx: E11.22 100 each 99  . lisinopril (ZESTRIL) 5 MG tablet TAKE 1 TABLET BY MOUTH  DAILY 90 tablet 3  . metFORMIN (GLUCOPHAGE) 1000 MG tablet TAKE 1 TABLET BY MOUTH  TWICE DAILY WITH A MEAL 180 tablet 1  . metoprolol succinate (TOPROL-XL) 25 MG 24 hr tablet TAKE 1 TABLET BY MOUTH  DAILY 90 tablet 1  . nitroGLYCERIN (NITROSTAT) 0.4 MG SL tablet Place 1 tablet (0.4 mg total) under the tongue every 5 (five) minutes as needed for chest pain. 30 tablet 1  . pantoprazole (PROTONIX) 20 MG tablet TAKE 1 TABLET BY MOUTH  DAILY 90 tablet 1  . pravastatin (PRAVACHOL) 40 MG tablet TAKE 1 TABLET BY MOUTH  DAILY 90 tablet 1   No current facility-administered medications for this visit.     Past Medical History:  Diagnosis Date  . A-fib ([place]) 12/20/2015  . Aortic stenosis, mild 01/01/2017  . Atherosclerosis of native coronary artery of native heart without angina pectoris 01/05/2016  . Benign essential hypertension 01/05/2016  . Cataract, nuclear, right 05/06/2014  . Current use of long term anticoagulation 12/20/2015  . Diabetic kidney disease ([place]) 01/05/2016  . GERD (gastroesophageal reflux disease)   .  History of Barrett's esophagus 12/20/2015  . 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 Hyperlipemia 10/26/2015  . Low back pain 05/02/2016  . Type 2 diabetes mellitus with kidney complication, without long-term current use of insulin ([place]) 07/03/2016    Past Surgical History:  Procedure Laterality Date  . BACK SURGERY  2004  . [place]  . SHOULDER SURGERY  2005    Social History   Socioeconomic History  . Marital status: Married    Spouse name: Edd Fabian  . Number of children:  2  . Years of education: 27  . Highest education level: 11th grade  Occupational History  . Occupation: truck Geophysicist/field seismologist    Comment: retired  Tobacco Use  . Smoking status: Former Smoker    Quit date: 09/10/1987    Years since quitting: 32.0  . Smokeless tobacco: Never Used  Substance and Sexual Activity  . Alcohol use: No  . Drug use: No  . Sexual activity: Not Currently  Other Topics Concern  . 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 Keeps grandsons and granddaughter   Social Determinants of Health   Financial Resource Strain: Low Risk   . Difficulty of Paying Living Expenses: Not hard at all  Food Insecurity: No Food Insecurity  . Worried About Charity fundraiser in the Last Year: Never true  . Ran Out of Food in the Last Year: Never true  Transportation Needs: No Transportation Needs  . Lack of Transportation (Medical): No  . Lack of Transportation (Non-Medical): No  Physical Activity: Sufficiently Active  . Days of Exercise per Week: 6 days  . Minutes of Exercise per Session: 60 min  Stress: No Stress Concern Present  . Feeling of Stress : Not at all  Social Connections: Slightly Isolated  . Frequency of Communication with Friends and Family: More than three times a week  . Frequency of Social Gatherings with Friends and Family: Once a week  . Attends Religious Services: More than 4 times per year  . Active Member of Clubs or Organizations: No  . Attends Archivist Meetings: Never  . Marital Status: Married  Human resources officer Violence: Not At Risk  . Fear of Current or Ex-Partner: No  . Emotionally Abused: No  . Physically Abused: No  . Sexually Abused: No    Family History  Problem Relation Age of Onset  . CAD Father   . Colon cancer Neg Hx   . Heart attack Neg Hx     ROS: no fevers or chills, productive cough, hemoptysis, dysphasia, odynophagia, melena,  hematochezia, dysuria, hematuria, rash, seizure activity, orthopnea, PND, pedal edema, claudication. Remaining systems are negative.  Physical Exam: Well-developed well-nourished in no acute distress.  Skin is warm and dry.  HEENT is normal.  Neck is supple.  Chest is clear to auscultation with normal expansion.  Cardiovascular exam is irregular; 3/6 systolic murmur Abdominal exam nontender or distended. No masses palpated. Extremities show no edema. neuro grossly intact  ECG-atrial fibrillation at a rate of 89, left axis deviation, left ventricular hypertrophy, right bundle branch block.  Personally reviewed  A/P  1 aortic stenosis-plan follow-up echocardiogram February 2021.  Patient will likely require aortic valve replacement in the future.  2 coronary artery disease status post coronary artery bypass and graft-continue Plavix  and statin.  No chest pain.  3 permanent atrial fibrillation-continue Toprol at present dose.  Patient declines anticoagulation and understands the risk of CVA. Continue Plavix.  4 hypertension-blood pressure controlled.  Continue present medications.  5 hyperlipidemia-continue statin.  Note he did not tolerate higher doses of pravastatin or Lipitor previously.  Garrett Hughes

## 2019-10-06 ENCOUNTER — Encounter: Payer: Self-pay | Admitting: Cardiology

## 2019-10-06 ENCOUNTER — Other Ambulatory Visit: Payer: Self-pay

## 2019-10-06 ENCOUNTER — Ambulatory Visit (INDEPENDENT_AMBULATORY_CARE_PROVIDER_SITE_OTHER): Payer: Medicare Other | Admitting: Cardiology

## 2019-10-06 VITALS — BP 125/71 | HR 89 | Ht 64.0 in | Wt 139.1 lb

## 2019-10-06 DIAGNOSIS — I4821 Permanent atrial fibrillation: Secondary | ICD-10-CM | POA: Diagnosis not present

## 2019-10-06 DIAGNOSIS — I350 Nonrheumatic aortic (valve) stenosis: Secondary | ICD-10-CM | POA: Diagnosis not present

## 2019-10-06 DIAGNOSIS — I2510 Atherosclerotic heart disease of native coronary artery without angina pectoris: Secondary | ICD-10-CM | POA: Diagnosis not present

## 2019-10-06 NOTE — Patient Instructions (Signed)
Medication Instructions:  NO CHANGE *If you need a refill on your cardiac medications before your next appointment, please call your pharmacy*  Lab Work: If you have labs (blood work) drawn today and your tests are completely normal, you will receive your results only by: Marland Kitchen MyChart Message (if you have MyChart) OR . A paper copy in the mail If you have any lab test that is abnormal or we need to change your treatment, we will call you to review the results.  Testing/Procedures: Your physician has requested that you have an echocardiogram. Echocardiography is a painless test that uses sound waves to create images of your heart. It provides your doctor with information about the size and shape of your heart and how well your heart's chambers and valves are working. This procedure takes approximately one hour. There are no restrictions for this procedure.IN THE [place] [facility] ON [address]    Follow-Up: At [facility], you and your health needs are our priority.  As part of our continuing mission to provide you with exceptional heart care, we have created designated Provider Care Teams.  These Care Teams include your primary Cardiologist (physician) and Advanced Practice Providers (APPs -  Physician Assistants and Nurse Practitioners) who all work together to provide you with the care you need, when you need it.  Your next appointment:   6 month(s)  The format for your next appointment:   Either In Person or Virtual  Provider:   Olga Millers, MD

## 2019-10-08 NOTE — Progress Notes (Signed)
Subjective:   Garrett Hughes is a 79 y.o. male who presents for Medicare Annual/Subsequent preventive examination.  Review of Systems:  No ROS.  Medicare Wellness Virtual Visit.  Visual/audio telehealth visit, UTA vital signs.   See social history for additional risk factors.    Cardiac Risk Factors include: advanced age (>74mn, >>52women);diabetes mellitus;hypertension;male gender  Sleep patterns: Getting 7 hours of sleep a night. Wakes up every 2 hours a night to void.Wakes up feels rested and ready for the day.    Home Safety/Smoke Alarms: Feels safe in home. Smoke alarms in place.  Living environment; Lives with wife in a 2 story home and stairs have hand rails on them. Shower is a walk in shower and no grab bars in place but has bench in place. Seat Belt Safety/Bike Helmet: Wears seat belt.   Male:   CCS- Aged out    PSA- UTD Lab Results  Component Value Date   PSA 0.4 10/12/2018        Objective:    Vitals: BP 111/60   Pulse (!) 58   Ht '5\' 4"'  (1.626 m)   Wt 144 lb (65.3 kg)   SpO2 97%   BMI 24.72 kg/m   Body mass index is 24.72 kg/m.  Advanced Directives 10/13/2019 10/12/2018 05/06/2017  Does Patient Have a Medical Advance Directive? No;Yes Yes Yes  Type of AParamedicof [place]Living will [place] will [place] will  Does patient want to make changes to medical advance directive? No - Patient declined No - Patient declined -  Copy of HE[place]agu[place] Chart? No - copy requested No - copy requested No - copy requested  Would patient like information on creating a medical advance directive? No - Patient declined - -    Tobacco Social History   Tobacco Use  Smoking Status Former Smoker  . Quit date: 09/10/1987  . Years since quitting: 32.1  Smokeless Tobacco Never Used     Counseling given: No   Clinical Intake:  Pre-visit preparation completed: Yes  Pain :  No/denies pain     Nutritional Risks: None Diabetes: Yes CBG done?: No(FBS at home this am 115) Did pt. bring in CBG monitor from home?: No  How often do you need to have someone help you when you read instructions, pamphlets, or other written materials from your doctor or pharmacy?: 1 - Never What is the last grade level you completed in school?: 12  Interpreter Needed?: No  Information entered by :: KOrlie Dakin LPN  Past Medical History:  Diagnosis Date  . A-fib ([place] 12/20/2015  . Aortic stenosis, mild 01/01/2017  . Atherosclerosis of native coronary artery of native heart without angina pectoris 01/05/2016  . Benign essential hypertension 01/05/2016  . Cataract, nuclear, right 05/06/2014  . Current use of long term anticoagulation 12/20/2015  . Diabetic kidney disease ([place] 01/05/2016  . GERD (gastroesophageal reflux disease)   . History of Barrett's esophagus 12/20/2015  . 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 Hyperlipemia 10/26/2015  . Low back pain 05/02/2016  . Type 2 diabetes mellitus with kidney complication, without long-term current use of insulin ([place] 07/03/2016   Past Surgical History:  Procedure Laterality Date  . BACK SURGERY  2004  . [place] . SHOULDER SURGERY  2005   Family History  Problem Relation Age of Onset  .  CAD Father   . Colon cancer Neg Hx   . Heart attack Neg Hx    Social History   Socioeconomic History  . Marital status: Married    Spouse name: Edd Fabian  . Number of children: 2  . Years of education: 13  . Highest education level: 11th grade  Occupational History  . Occupation: truck Geophysicist/field seismologist    Comment: retired  Tobacco Use  . Smoking status: Former Smoker    Quit date: 09/10/1987    Years since quitting: 32.1  . Smokeless tobacco: Never Used  Substance and Sexual Activity  . Alcohol use: No  . Drug use: No  . Sexual activity: Not Currently  Other Topics Concern  . Not on file   Social History Narrative   Married, retired Administrator   1 son one daughter   2 caffeinated beverages daily   3. Keeps grandsons and granddaughter   Social Determinants of Health   Financial Resource Strain:   . Difficulty of Paying Living Expenses: Not on file  Food Insecurity:   . Worried About Charity fundraiser in the Last Year: Not on file  . Ran Out of Food in the Last Year: Not on file  Transportation Needs:   . Lack of Transportation (Medical): Not on file  . Lack of Transportation (Non-Medical): Not on file  Physical Activity:   . Days of Exercise per Week: Not on file  . Minutes of Exercise per Session: Not on file  Stress:   . Feeling of Stress : Not on file  Social Connections:   . Frequency of Communication with Friends and Family: Not on file  . Frequency of Social Gatherings with Friends and Family: Not on file  . Attends Religious Services: Not on file  . Active Member of Clubs or Organizations: Not on file  . Attends Archivist Meetings: Not on file  . Marital Status: Not on file    Outpatient Encounter Medications as of 10/13/2019  Medication Sig  . ACCU-CHEK SOFTCLIX LANCETS lancets Use as instructed to check blood sugar up to qid. Dx Code E11.22.  Marland Kitchen Alcohol Swabs PADS Use as instructed to check blood sugar up to qid. Dx code E11.22  . AMBULATORY NON FORMULARY MEDICATION Medication Name: accu-chek aviva plus meter, accu-check aviva plus test strips & accu-chek softclix lancets to test twice a day. Dx: E11.29  . aspirin 81 MG chewable tablet Chew by mouth daily.  . Blood Glucose Calibration (ACCU-CHEK AVIVA) SOLN Use as instructed per manufacturer directions. Dx code E11.22  . Blood Glucose Monitoring Suppl (ACCU-CHEK AVIVA PLUS) w/Device KIT Use as instructed to check blood sugar up to qid. Dx code E11.22  . clopidogrel (PLAVIX) 75 MG tablet TAKE 1 TABLET BY MOUTH  DAILY  . doxazosin (CARDURA) 8 MG tablet TAKE 1 TABLET BY MOUTH  DAILY  . ferrous  sulfate 325 (65 FE) MG EC tablet Take 1 tablet (325 mg total) by mouth daily with breakfast.  . furosemide (LASIX) 40 MG tablet MAINTENANCE: TAKE 1 TABLET  BY MOUTH DAILY. IF  INCREASED SWELLING OR  SHORTNESS OF BREATH: 1 TAB  BY MOUTH TWICE DAILY  . glipiZIDE (GLUCOTROL) 5 MG tablet TAKE 1 TABLET BY MOUTH  TWICE A DAY BEFORE MEALS  . glucose blood (ACCU-CHEK AVIVA PLUS) test strip Use as instructed to check blood sugar up to qid. Dx Code E11.22. Disp generic per pt preference / insurance coverage  . Lancets (ONETOUCH ULTRASOFT) lancets Use as instructed  to check blood sugar up to qid. Dx: E11.22  . lisinopril (ZESTRIL) 5 MG tablet TAKE 1 TABLET BY MOUTH  DAILY  . metFORMIN (GLUCOPHAGE) 1000 MG tablet TAKE 1 TABLET BY MOUTH  TWICE DAILY WITH A MEAL  . metoprolol succinate (TOPROL-XL) 25 MG 24 hr tablet TAKE 1 TABLET BY MOUTH  DAILY  . nitroGLYCERIN (NITROSTAT) 0.4 MG SL tablet Place 1 tablet (0.4 mg total) under the tongue every 5 (five) minutes as needed for chest pain.  . pantoprazole (PROTONIX) 20 MG tablet TAKE 1 TABLET BY MOUTH  DAILY  . pravastatin (PRAVACHOL) 40 MG tablet TAKE 1 TABLET BY MOUTH  DAILY   No facility-administered encounter medications on file as of 10/13/2019.    Activities of Daily Living In your present state of health, do you have any difficulty performing the following activities: 10/13/2019  Hearing? N  Vision? N  Difficulty concentrating or making decisions? N  Walking or climbing stairs? N  Dressing or bathing? N  Doing errands, shopping? N  Preparing Food and eating ? N  Using the Toilet? N  In the past six months, have you accidently leaked urine? N  Do you have problems with loss of bowel control? N  Managing your Medications? N  Managing your Finances? N  Housekeeping or managing your Housekeeping? N  Some recent data might be hidden    Patient Care Team: Emeterio Reeve, DO as PCP - General (Osteopathic Medicine)   Assessment:   This is a routine  wellness examination for Veron.Physical assessment deferred to PCP.   Exercise Activities and Dietary recommendations Current Exercise Habits: The patient does not participate in regular exercise at present(does chores around house. Works on cars and walks some), Exercise limited by: None identified Diet Eats a fairly healthy diet. Breakfast: Sausage and eggs Lunch:snacks Dinner: Meat and vegetable.  Patient states can not smell or taste for 20 years so it affects his appetite. Drinks water daily      Goals    . Exercise 3x per week (30 min per time)     Would like to increase his walking for exercise       Fall Risk Fall Risk  10/13/2019 10/12/2018 05/06/2017  Falls in the past year? 0 0 No  Risk for fall due to : No Fall Risks - -  Follow up Falls prevention discussed Falls prevention discussed -   Is the patient's home free of loose throw rugs in walkways, pet beds, electrical cords, etc?   yes      Grab bars in the bathroom? no      Handrails on the stairs?   yes      Adequate lighting?   yes   Depression Screen PHQ 2/9 Scores 10/13/2019 05/18/2019 10/12/2018 09/14/2018  PHQ - 2 Score 0 - 0 0  PHQ- 9 Score - - 3 8  Exception Documentation - Patient refusal - -    Cognitive Function     6CIT Screen 10/13/2019 10/12/2018  What Year? 0 points 0 points  What month? 0 points 0 points  What time? 0 points 0 points  Count back from 20 2 points 0 points  Months in reverse 2 points 0 points  Repeat phrase 2 points 0 points  Total Score 6 0    Immunization History  Administered Date(s) Administered  . Fluad Quad(high Dose 65+) 05/18/2019  . Influenza, High Dose Seasonal PF 09/14/2018  . Influenza,inj,Quad PF,6+ Mos 05/06/2017  . Tdap 09/14/2018  Screening Tests Health Maintenance  Topic Date Due  . FOOT EXAM  10/23/1950  . OPHTHALMOLOGY EXAM  10/23/1950  . PNA vac Low Risk Adult (1 of 2 - PCV13) 10/23/2005  . HEMOGLOBIN A1C  03/20/2020  . TETANUS/TDAP  09/14/2028  .  INFLUENZA VACCINE  Completed       Plan:    Please schedule your next medicare wellness visit with me in 1 yr.  Garrett Hughes , Thank you for taking time to come for your Medicare Wellness Visit. I appreciate your ongoing commitment to your health goals. Please review the following plan we discussed and let me know if I can assist you in the future.  Continue doing brain stimulating activities (puzzles, reading, adult coloring books, staying active) to keep memory sharp.   Gave information to sign up for COVID vaccine. Go to [email or web address] to sign up. These are the goals we discussed: Goals    . Exercise 3x per week (30 min per time)     Would like to increase his walking for exercise       This is a list of the screening recommended for you and due dates:  Health Maintenance  Topic Date Due  . Complete foot exam   10/23/1950  . Eye exam for diabetics  10/23/1950  . Pneumonia vaccines (1 of 2 - PCV13) 10/23/2005  . Hemoglobin A1C  03/20/2020  . Tetanus Vaccine  09/14/2028  . Flu Shot  Completed     I have personally reviewed and noted the following in the patient's chart:   . Medical and social history . Use of alcohol, tobacco or illicit drugs  . Current medications and supplements . Functional ability and status . Nutritional status . Physical activity . Advanced directives . List of other physicians . Hospitalizations, surgeries, and ER visits in previous 12 months . Vitals . Screenings to include cognitive, depression, and falls . Referrals and appointments  In addition, I have reviewed and discussed with patient certain preventive protocols, quality metrics, and best practice recommendations. A written personalized care plan for preventive services as well as general preventive health recommendations were provided to patient.     Joanne Chars, LPN  01/13/4331

## 2019-10-13 ENCOUNTER — Ambulatory Visit (INDEPENDENT_AMBULATORY_CARE_PROVIDER_SITE_OTHER): Payer: Medicare Other | Admitting: *Deleted

## 2019-10-13 ENCOUNTER — Other Ambulatory Visit: Payer: Self-pay

## 2019-10-13 VITALS — BP 111/60 | HR 58 | Ht 64.0 in | Wt 144.0 lb

## 2019-10-13 DIAGNOSIS — Z0000 Encounter for general adult medical examination without abnormal findings: Secondary | ICD-10-CM

## 2019-10-13 NOTE — Patient Instructions (Addendum)
Please schedule your next medicare wellness visit with me in 1 yr.  Garrett Hughes , Thank you for taking time to come for your Medicare Wellness Visit. I appreciate your ongoing commitment to your health goals. Please review the following plan we discussed and let me know if I can assist you in the future.  Continue doing brain stimulating activities (puzzles, reading, adult coloring books, staying active) to keep memory sharp.   Gave information to sign up for COVID vaccine. Go to [email or web address] to sign up.  These are the goals we discussed: Goals    . Exercise 3x per week (30 min per time)     Would like to increase his walking for exercise

## 2019-10-19 ENCOUNTER — Other Ambulatory Visit: Payer: Self-pay

## 2019-10-19 ENCOUNTER — Telehealth: Payer: Self-pay | Admitting: *Deleted

## 2019-10-19 ENCOUNTER — Ambulatory Visit (HOSPITAL_BASED_OUTPATIENT_CLINIC_OR_DEPARTMENT_OTHER)
Admission: RE | Admit: 2019-10-19 | Discharge: 2019-10-19 | Disposition: A | Discharge status: Home / Self Care | Payer: Medicare Other | Source: Ambulatory Visit | Attending: Cardiology | Admitting: Cardiology

## 2019-10-19 DIAGNOSIS — I350 Nonrheumatic aortic (valve) stenosis: Secondary | ICD-10-CM | POA: Insufficient documentation

## 2019-10-19 NOTE — Progress Notes (Signed)
  Echocardiogram 2D Echocardiogram has been performed.  Garrett Hughes 10/19/2019, 9:01 AM

## 2019-10-19 NOTE — Telephone Encounter (Addendum)
Spoke with pt, Follow up scheduled   ----- Message from Lewayne Bunting, MD sent at 10/19/2019  1:59 PM EST ----- Echo with severe aortic stenosis.  Schedule follow-up elective office visit. Garrett Hughes

## 2019-10-20 ENCOUNTER — Encounter: Payer: Self-pay | Admitting: Cardiology

## 2019-10-20 ENCOUNTER — Other Ambulatory Visit: Payer: Self-pay | Admitting: *Deleted

## 2019-10-20 ENCOUNTER — Ambulatory Visit (INDEPENDENT_AMBULATORY_CARE_PROVIDER_SITE_OTHER): Payer: Medicare Other | Admitting: Cardiology

## 2019-10-20 VITALS — BP 123/45 | HR 89 | Ht 64.0 in | Wt 142.0 lb

## 2019-10-20 DIAGNOSIS — I4821 Permanent atrial fibrillation: Secondary | ICD-10-CM

## 2019-10-20 DIAGNOSIS — I350 Nonrheumatic aortic (valve) stenosis: Secondary | ICD-10-CM

## 2019-10-20 DIAGNOSIS — I2510 Atherosclerotic heart disease of native coronary artery without angina pectoris: Secondary | ICD-10-CM | POA: Diagnosis not present

## 2019-10-20 MED ORDER — SODIUM CHLORIDE 0.9% FLUSH: 3.0000 mL | Freq: Two times a day (BID) | INTRAVENOUS | Status: DC

## 2019-10-20 NOTE — Progress Notes (Signed)
HPI: FU CAD (s/p [id number]), AS and atrial fibrillation. Not on anticoagulation as pt refused in past and had h/o GI bleed and urinary tract bleeding.Abdominal ultrasound February 2019 showed no aneurysm. Echocardiogram February 2020 showed normal LV systolic function, moderate left ventricular hypertrophy, moderate biatrial enlargement, moderate mitral regurgitation, moderate tricuspid regurgitation, probable moderate aortic stenosis with mean gradient 25 mmHg and mild aortic insufficiency.  Echocardiogram repeated October 19, 2019 and showed normal LV function, mild left ventricular hypertrophy, severe left atrial enlargement, moderate mitral regurgitation, moderate tricuspid regurgitation, severe aortic stenosis with mean gradient 39 mmHg and mild to moderate aortic insufficiency.  Since last seen since patient states that over the past 6 months to 1 year he has had increased dyspnea with more moderate activities but not routine activities.  No orthopnea, PND, pedal edema, chest pain, palpitations or syncope.  Current Outpatient Medications  Medication Sig Dispense Refill  . ACCU-CHEK SOFTCLIX LANCETS lancets Use as instructed to check blood sugar up to qid. Dx Code E11.22. 100 each 12  . Alcohol Swabs PADS Use as instructed to check blood sugar up to qid. Dx code E11.22 100 each 1  . AMBULATORY NON FORMULARY MEDICATION Medication Name: accu-chek aviva plus meter, accu-check aviva plus test strips & accu-chek softclix lancets to test twice a day. Dx: E11.29 1 vial PRN  . aspirin 81 MG chewable tablet Chew by mouth daily.    . Blood Glucose Calibration (ACCU-CHEK AVIVA) SOLN Use as instructed per manufacturer directions. Dx code E11.22 1 each 0  . Blood Glucose Monitoring Suppl (ACCU-CHEK AVIVA PLUS) w/Device KIT Use as instructed to check blood sugar up to qid. Dx code E11.22 1 kit 0  . clopidogrel (PLAVIX) 75 MG tablet TAKE 1 TABLET BY MOUTH  DAILY 90 tablet 3  . doxazosin (CARDURA) 8 MG  tablet TAKE 1 TABLET BY MOUTH  DAILY 90 tablet 1  . ferrous sulfate 325 (65 FE) MG EC tablet Take 1 tablet (325 mg total) by mouth daily with breakfast. 90 tablet 3  . furosemide (LASIX) 40 MG tablet MAINTENANCE: TAKE 1 TABLET  BY MOUTH DAILY. IF  INCREASED SWELLING OR  SHORTNESS OF BREATH: 1 TAB  BY MOUTH TWICE DAILY 180 tablet 3  . glipiZIDE (GLUCOTROL) 5 MG tablet TAKE 1 TABLET BY MOUTH  TWICE A DAY BEFORE MEALS 180 tablet 1  . glucose blood (ACCU-CHEK AVIVA PLUS) test strip Use as instructed to check blood sugar up to qid. Dx Code E11.22. Disp generic per pt preference / insurance coverage 100 each 99  . Lancets (ONETOUCH ULTRASOFT) lancets Use as instructed to check blood sugar up to qid. Dx: E11.22 100 each 99  . lisinopril (ZESTRIL) 5 MG tablet TAKE 1 TABLET BY MOUTH  DAILY 90 tablet 3  . metFORMIN (GLUCOPHAGE) 1000 MG tablet TAKE 1 TABLET BY MOUTH  TWICE DAILY WITH A MEAL 180 tablet 1  . metoprolol succinate (TOPROL-XL) 25 MG 24 hr tablet TAKE 1 TABLET BY MOUTH  DAILY 90 tablet 1  . nitroGLYCERIN (NITROSTAT) 0.4 MG SL tablet Place 1 tablet (0.4 mg total) under the tongue every 5 (five) minutes as needed for chest pain. 30 tablet 1  . pantoprazole (PROTONIX) 20 MG tablet TAKE 1 TABLET BY MOUTH  DAILY 90 tablet 1  . pravastatin (PRAVACHOL) 40 MG tablet TAKE 1 TABLET BY MOUTH  DAILY 90 tablet 1   No current facility-administered medications for this visit.     Past Medical History:  Diagnosis Date  .  A-fib ([place]) 12/20/2015  . Aortic stenosis, mild 01/01/2017  . Atherosclerosis of native coronary artery of native heart without angina pectoris 01/05/2016  . Benign essential hypertension 01/05/2016  . Cataract, nuclear, right 05/06/2014  . Current use of long term anticoagulation 12/20/2015  . Diabetic kidney disease ([place]) 01/05/2016  . GERD (gastroesophageal reflux disease)   . History of Barrett's esophagus 12/20/2015  . Hx of CABG 07/03/2016   Overview:  1997, LIMA- LAD, RIMA to RCA, SVG to  OM  . Hyperlipemia 10/26/2015  . Low back pain 05/02/2016  . Type 2 diabetes mellitus with kidney complication, without long-term current use of insulin ([place]) 07/03/2016    Past Surgical History:  Procedure Laterality Date  . BACK SURGERY  2004  . [place]  . SHOULDER SURGERY  2005    Social History   Socioeconomic History  . Marital status: Married    Spouse name: Edd Fabian  . Number of children: 2  . Years of education: 27  . Highest education level: 11th grade  Occupational History  . Occupation: truck Geophysicist/field seismologist    Comment: retired  Tobacco Use  . Smoking status: Former Smoker    Quit date: 09/10/1987    Years since quitting: 32.1  . Smokeless tobacco: Never Used  Substance and Sexual Activity  . Alcohol use: No  . Drug use: No  . Sexual activity: Not Currently  Other Topics Concern  . 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 Keeps grandsons and granddaughter   Social Determinants of Health   Financial Resource Strain:   . Difficulty of Paying Living Expenses: Not on file  Food Insecurity:   . Worried About Charity fundraiser in the Last Year: Not on file  . Ran Out of Food in the Last Year: Not on file  Transportation Needs:   . Lack of Transportation (Medical): Not on file  . Lack of Transportation (Non-Medical): Not on file  Physical Activity:   . Days of Exercise per Week: Not on file  . Minutes of Exercise per Session: Not on file  Stress:   . Feeling of Stress : Not on file  Social Connections:   . Frequency of Communication with Friends and Family: Not on file  . Frequency of Social Gatherings with Friends and Family: Not on file  . Attends Religious Services: Not on file  . Active Member of Clubs or Organizations: Not on file  . Attends Archivist Meetings: Not on file  . Marital Status: Not on file  Intimate Partner Violence:   . Fear of  Current or Ex-Partner: Not on file  . Emotionally Abused: Not on file  . Physically Abused: Not on file  . Sexually Abused: Not on file    Family History  Problem Relation Age of Onset  . CAD Father   . Colon cancer Neg Hx   . Heart attack Neg Hx     ROS: no fevers or chills, productive cough, hemoptysis, dysphasia, odynophagia, melena, hematochezia, dysuria, hematuria, rash, seizure activity, orthopnea, PND, pedal edema, claudication. Remaining systems are negative.  Physical Exam: Well-developed well-nourished no acute distress Skin warm and dry HEENT normal with normal eyelids. Neck supple Chest clear to auscultation CV irregular, 3/6 systolic murmur left sternal border Abdominal exam-not tender or distended, no masses Extremities show no edema. Neurological exam grossly intact  A/P  1 aortic stenosis-I have personally reviewed the patient's most recent echocardiogram.  He has severe aortic stenosis with probable mild aortic insufficiency.  His mitral regurgitation appears to be mild to at most moderate.  He has developed increasing dyspnea on exertion by his report.  I therefore feel that we should proceed with evaluation for possible TAVR.  Patient in agreement.  I will arrange right and left cardiac catheterization.  The risks and benefits including myocardial infarction, CVA and death discussed and he agrees to proceed.  We will then arrange evaluation in structural heart clinic.  Would Lasix the day before and the day of the procedure.  Hold Glucophage for 48 hours following procedure.  2 coronary artery disease status post coronary artery bypass and graft-continue Plavix and statin.  No chest pain.  3 permanent atrial fibrillation-continue Toprol at present dose.  Patient declines anticoagulation and understands the risk of CVA. Continue Plavix.  4 hypertension-blood pressure controlled.  Continue present medications.  5 hyperlipidemia-continue statin.  Note he did not  tolerate higher doses of pravastatin or Lipitor previously.  Kirk Ruths

## 2019-10-20 NOTE — Patient Instructions (Signed)
    [facility]ARDIOVASCULAR [facility] [phone] [address] [place] [address] Dept: [phone] Loc: [phone]  RANJIT ASHURST  10/20/2019  You are scheduled for a Cardiac Catheterization on Friday, March 19 with Dr. [name].  1. Please arrive at the [facility] (Main Entrance A) at [facility]: [address] [place], [place] [address] at 8:30 AM (This time is two hours before your procedure to ensure your preparation). Free valet parking service is available.   Special note: Every effort is made to have your procedure done on time. Please understand that emergencies sometimes delay scheduled procedures.  2. Diet: Do not eat solid foods after midnight.  The patient may have clear liquids until 5am upon the day of the procedure.  3. Labs: You will need to have blood drawn on Tuesday, March 16 at [facility] IN THE [place] OFFICEYou do not need to be fasting.  GO TO [address] IN  Tuesday 11-23-19 @ 1 PM FOR COVID TESTING  4. Medication instructions in preparation for your procedure:  DO NOT TAKE FUROSEMIDE Thursday 3-18 AND Friday 3-19 = RESTART Saturday 3-20  DO NOT TAKE METFORMIN Friday 3/19, Saturday 3/20 OR Sunday 3/21 = RESTART 3/22 Monday  DO NOT TAKE GLIPIZIDE Friday 3/19 MORNING  On the morning of your procedure, take your Plavix/Clopidogrel and any morning medicines NOT listed above.  You may use sips of water.  5. Plan for one night stay--bring personal belongings. 6. Bring a current list of your medications and current insurance cards. 7. You MUST have a responsible person to drive you home. 8. Someone MUST be with you the first 24 hours after you arrive home or your discharge will be delayed. 9. Please wear clothes that are easy to get on and off and wear slip-on shoes.  Thank you for allowing [place] to care for you!   -- [facility]  services  Your physician recommends that you schedule a follow-up appointment in: 10 WEEKS WITH DR Jens Som

## 2019-11-23 ENCOUNTER — Encounter: Payer: Self-pay | Admitting: *Deleted

## 2019-11-23 ENCOUNTER — Other Ambulatory Visit (HOSPITAL_COMMUNITY)
Admission: RE | Admit: 2019-11-23 | Discharge: 2019-11-23 | Disposition: A | Discharge status: Home / Self Care | Payer: Medicare Other | Source: Ambulatory Visit | Attending: Cardiovascular Disease | Admitting: Cardiovascular Disease

## 2019-11-23 DIAGNOSIS — Z01812 Encounter for preprocedural laboratory examination: Secondary | ICD-10-CM | POA: Insufficient documentation

## 2019-11-23 DIAGNOSIS — Z20822 Contact with and (suspected) exposure to covid-19: Secondary | ICD-10-CM | POA: Diagnosis not present

## 2019-11-23 DIAGNOSIS — I350 Nonrheumatic aortic (valve) stenosis: Secondary | ICD-10-CM | POA: Diagnosis not present

## 2019-11-23 LAB — BASIC METABOLIC PANEL
BUN/Creatinine Ratio: 22 (calc) (ref 6–22)
BUN: 27 mg/dL — ABNORMAL HIGH (ref 7–25)
CO2: 25 mmol/L (ref 20–32)
Calcium: 8.4 mg/dL — ABNORMAL LOW (ref 8.6–10.3)
Chloride: 98 mmol/L (ref 98–110)
Creat: 1.25 mg/dL — ABNORMAL HIGH (ref 0.70–1.18)
Glucose, Bld: 237 mg/dL — ABNORMAL HIGH (ref 65–139)
Potassium: 4.4 mmol/L (ref 3.5–5.3)
Sodium: 137 mmol/L (ref 135–146)

## 2019-11-23 LAB — CBC
HCT: 32.6 % — ABNORMAL LOW (ref 38.5–50.0)
Hemoglobin: 10.8 g/dL — ABNORMAL LOW (ref 13.2–17.1)
MCH: 33.1 pg — ABNORMAL HIGH (ref 27.0–33.0)
MCHC: 33.1 g/dL (ref 32.0–36.0)
MCV: 100 fL (ref 80.0–100.0)
MPV: 12.4 fL (ref 7.5–12.5)
Platelets: 141 10*3/uL (ref 140–400)
RBC: 3.26 10*6/uL — ABNORMAL LOW (ref 4.20–5.80)
RDW: 12.8 % (ref 11.0–15.0)
WBC: 7 10*3/uL (ref 3.8–10.8)

## 2019-11-23 LAB — SARS CORONAVIRUS 2 (TAT 6-24 HRS): SARS Coronavirus 2: NEGATIVE

## 2019-11-24 NOTE — Progress Notes (Addendum)
Virtual Visit via Telephone Note   This visit type was conducted due to national recommendations for restrictions regarding the COVID-19 Pandemic (e.g. social distancing) in an effort to limit this patient's exposure and mitigate transmission in our community.  Due to his co-morbid illnesses, this patient is at least at moderate risk for complications without adequate follow up.  This format is felt to be most appropriate for this patient at this time.  The patient did not have access to video technology/had technical difficulties with video requiring transitioning to audio format only (telephone).  All issues noted in this document were discussed and addressed.  No physical exam could be performed with this format.  Please refer to the patient's chart for his  consent to telehealth for [facility].  Evaluation Performed:  Follow-up visit    Date:  11/25/2019   ID:  Garrett Hughes, DOB [birth year], MRN [id number]  Patient Location:  [place] [address]   Provider location:     [address] Office [phone] Fax [phone]   PCP:  Emeterio Reeve, DO  Cardiologist:  Kirk Ruths, MD  Electrophysiologist:  None   Chief Complaint: Follow-up  History of Present Illness:    Garrett Hughes is a 79 y.o. male who presents via audio/video conferencing for a telehealth visit today.  Patient verified DOB and address.  The patient does not symptoms concerning for COVID-19 infection (fever, chills, cough, or new SHORTNESS OF BREATH).   Garrett Hughes has a past medical history of coronary artery disease (status post CABG 1997), aortic stenosis, and atrial fibrillation.  He is not taking anticoagulation due to refusing and history of GI bleed and urinary tract bleeding.  An abdominal ultrasound 2/19 showed no aneurysm.  His echocardiogram 10/2018 showed normal LV EF, moderate LVH, moderate biatrial enlargement,  moderate mitral regurgitation, moderate tricuspid regurgitation, probable moderate aortic stenosis with mean gradient of 25 mmHg, and mild aortic insufficiency.  His echocardiogram 10/2019 showed normal LV function, mild left ventricular hypertrophy, severe left atrial enlargement, moderate mitral regurgitation, moderate tricuspid regurgitation, severe aortic stenosis with a mean gradient of 49 mmHg and mild to moderate aortic insufficiency.  He was seen by Dr. Stanford Breed on 10/20/2019.  During that time he was noted to have increased dyspnea with more moderate activities but not routine activities.  He had no orthopnea PND, lower extremity edema, chest discomfort, palpitations, or syncope.  Due to his aortic insufficiency and severe aortic stenosis along with his increasing dyspnea on exertion TAVR procedure was discussed.  He is scheduled for a left and right heart cath on 11/26/2019.  He is seen virtually today for follow-up and states he feels well.  He states he continues to have shortness of breath with minimal exertion.  He has not been able to walk any distance for the last 6 months without developing dyspnea.  He states that when he gets up and moves he must use a very slow pace.  He does not have any shortness of breath without activity.  He did not take his glipizide and has been taking his Lasix as prescribed.  He was given the opportunity to ask questions and all questions were answered.  He and his wife agree to proceed with the cardiac catheterization and TAVR procedure.  Today he denies chest pain, increased shortness of breath, lower extremity edema, fatigue, palpitations, melena, hematuria, hemoptysis, diaphoresis, weakness, presyncope, syncope, orthopnea, and PND.  Prior CV studies:   The following studies were reviewed today:  Echocardiogram 10/19/2019  IMPRESSIONS   1. Left ventricular ejection fraction, by estimation, is 55 to 60%. The  left ventricle has normal function. The left  ventrical has no regional  wall motion abnormalities. There is mildly increased left ventricular  hypertrophy. Left ventricular diastolic  parameters are indeterminate.  2. Right ventricular systolic function is normal. The right ventricular  size is normal. There is moderately elevated pulmonary artery systolic  pressure.  3. Left atrial size was severely dilated.  4. The mitral valve is degenerative. Moderate mitral valve regurgitation.  5. Tricuspid valve regurgitation is moderate.  6. The aortic valve is bicuspid. Aortic valve regurgitation is mild to  moderate. Severe aortic valve stenosis.  7. The inferior vena cava is normal in size with <50% respiratory  variability, suggesting right atrial pressure of 8 mmHg.   FINDINGS  Left Ventricle: Left ventricular ejection fraction, by estimation, is 55  to 60%. The left ventricle has normal function. The left ventricle has no  regional wall motion abnormalities. The left ventricular internal cavity  size was normal in size. There is  mildly increased left ventricular hypertrophy. Concentric remodeling left  ventricular hypertrophy. Left ventricular diastolic parameters are  indeterminate.   Right Ventricle: The right ventricular size is normal. No increase in  right ventricular wall thickness. Right ventricular systolic function is  normal. There is moderately elevated pulmonary artery systolic pressure.  The tricuspid regurgitant velocity is  3.61 m/s, and with an assumed right atrial pressure of 8 mmHg, the  estimated right ventricular systolic pressure is 01.0 mmHg.   Left Atrium: Left atrial size was severely dilated.   Right Atrium: Right atrial size was normal in size.   Pericardium: There is no evidence of pericardial effusion.   Mitral Valve: The mitral valve is degenerative in appearance. There is  moderate thickening of the anterior and posterior mitral valve leaflet(s).  There is mild calcification of the  anterior mitral valve leaflet(s).  Normal mobility of the mitral valve  leaflets. Moderate mitral annular calcification. Moderate mitral valve  regurgitation. No evidence of mitral valve stenosis.   Tricuspid Valve: The tricuspid valve is normal in structure. Tricuspid  valve regurgitation is moderate . No evidence of tricuspid stenosis.   Aortic Valve: The aortic valve is bicuspid and severely retricted. Aortic  valve regurgitation is mild to moderate. Aortic regurgitation PHT measures  284 msec. Severe aortic stenosis is present. Mild aortic valve annular  calcification. There is moderate  thickening of the aortic valve. Aortic valve mean gradient measures 39.4  mmHg. Aortic valve peak gradient measures 59.4 mmHg. Aortic valve area, by  VTI measures 0.57 cm.   Pulmonic Valve: The pulmonic valve was normal in structure. Pulmonic valve  regurgitation is mild. No evidence of pulmonic stenosis.   Aorta: The aortic root is normal in size and structure.   Venous: The pulmonary veins were not well visualized. The inferior vena  cava is normal in size with less than 50% respiratory variability,  suggesting right atrial pressure of 8 mmHg. The inferior vena cava and the  hepatic vein show a normal flow pattern.   IAS/Shunts: No atrial level shunt detected by color flow Doppler.  Past Medical History:  Diagnosis Date  . A-fib ([place]) 12/20/2015  . Aortic stenosis, mild 01/01/2017  . Atherosclerosis of native coronary artery of native heart without angina pectoris 01/05/2016  . Benign essential hypertension 01/05/2016  . Cataract, nuclear,  right 05/06/2014  . Current use of long term anticoagulation 12/20/2015  . Diabetic kidney disease ([place]) 01/05/2016  . GERD (gastroesophageal reflux disease)   . History of Barrett's esophagus 12/20/2015  . 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 Hyperlipemia 10/26/2015  . Low back pain 05/02/2016  . Type 2 diabetes  mellitus with kidney complication, without long-term current use of insulin ([place]) 07/03/2016   Past Surgical History:  Procedure Laterality Date  . BACK SURGERY  2004  . [place]  . SHOULDER SURGERY  2005     Current Meds  Medication Sig  . ACCU-CHEK SOFTCLIX LANCETS lancets Use as instructed to check blood sugar up to qid. Dx Code E11.22.  Marland Kitchen Alcohol Swabs PADS Use as instructed to check blood sugar up to qid. Dx code E11.22  . AMBULATORY NON FORMULARY MEDICATION Medication Name: accu-chek aviva plus meter, accu-check aviva plus test strips & accu-chek softclix lancets to test twice a day. Dx: E11.29  . aspirin 81 MG chewable tablet Chew by mouth daily.  . Blood Glucose Calibration (ACCU-CHEK AVIVA) SOLN Use as instructed per manufacturer directions. Dx code E11.22  . Blood Glucose Monitoring Suppl (ACCU-CHEK AVIVA PLUS) w/Device KIT Use as instructed to check blood sugar up to qid. Dx code E11.22  . clopidogrel (PLAVIX) 75 MG tablet TAKE 1 TABLET BY MOUTH  DAILY (Patient taking differently: Take 75 mg by mouth daily. )  . doxazosin (CARDURA) 8 MG tablet TAKE 1 TABLET BY MOUTH  DAILY (Patient taking differently: Take 8 mg by mouth daily. )  . erythromycin ophthalmic ointment Place 1 application into both eyes at bedtime as needed (irritation).  . ferrous sulfate 325 (65 FE) MG EC tablet Take 1 tablet (325 mg total) by mouth daily with breakfast.  . furosemide (LASIX) 40 MG tablet MAINTENANCE: TAKE 1 TABLET  BY MOUTH DAILY. IF  INCREASED SWELLING OR  SHORTNESS OF BREATH: 1 TAB  BY MOUTH TWICE DAILY (Patient taking differently: Take 40 mg by mouth daily. )  . glipiZIDE (GLUCOTROL) 5 MG tablet TAKE 1 TABLET BY MOUTH  TWICE A DAY BEFORE MEALS (Patient taking differently: Take 5 mg by mouth 2 (two) times daily before a meal. )  . glucose blood (ACCU-CHEK AVIVA PLUS) test strip Use as instructed to check blood sugar up to qid. Dx Code E11.22. Disp generic per pt preference / insurance  coverage  . Lancets (ONETOUCH ULTRASOFT) lancets Use as instructed to check blood sugar up to qid. Dx: E11.22  . lisinopril (ZESTRIL) 5 MG tablet TAKE 1 TABLET BY MOUTH  DAILY (Patient taking differently: Take 5 mg by mouth daily. )  . metFORMIN (GLUCOPHAGE) 1000 MG tablet TAKE 1 TABLET BY MOUTH  TWICE DAILY WITH A MEAL (Patient taking differently: Take 1,000 mg by mouth 2 (two) times daily with a meal. )  . metoprolol succinate (TOPROL-XL) 25 MG 24 hr tablet TAKE 1 TABLET BY MOUTH  DAILY (Patient taking differently: Take 25 mg by mouth daily. )  . Multiple Vitamin (MULTI-VITAMIN DAILY PO) Take by mouth.  . nitroGLYCERIN (NITROSTAT) 0.4 MG SL tablet Place 1 tablet (0.4 mg total) under the tongue every 5 (five) minutes as needed for chest pain.  . pantoprazole (PROTONIX) 20 MG tablet TAKE 1 TABLET BY MOUTH  DAILY (Patient taking differently: Take 20 mg by mouth daily. )  . pravastatin (PRAVACHOL) 40 MG tablet TAKE 1 TABLET BY MOUTH  DAILY (  Patient taking differently: Take 40 mg by mouth daily. )   Current Facility-Administered Medications for the 11/25/19 encounter (Telemedicine) with Deberah Pelton, NP  Medication  . sodium chloride flush (NS) 0.9 % injection 3 mL     Allergies:   Atorvastatin, Cholestyramine, and Pravastatin   Social History   Tobacco Use  . Smoking status: Former Smoker    Quit date: 09/10/1987    Years since quitting: 32.2  . Smokeless tobacco: Never Used  Substance Use Topics  . Alcohol use: No  . Drug use: No     Family Hx: The patient's family history includes CAD in his father. There is no history of Colon cancer or Heart attack.  ROS:   Please see the history of present illness.     All other systems reviewed and are negative.   Labs/Other Tests and Data Reviewed:    Recent Labs: 03/17/2019: Brain Natriuretic Peptide 381 09/21/2019: ALT 17; Magnesium 1.5 11/23/2019: BUN 27; Creat 1.25; Hemoglobin 10.8; Platelets 141; Potassium 4.4; Sodium 137   Recent  Lipid Panel Lab Results  Component Value Date/Time   CHOL 145 09/21/2019 09:22 AM   TRIG 71 09/21/2019 09:22 AM   HDL 63 09/21/2019 09:22 AM   CHOLHDL 2.3 09/21/2019 09:22 AM   LDLCALC 67 09/21/2019 09:22 AM    Wt Readings from Last 3 Encounters:  11/25/19 142 lb (64.4 kg)  10/20/19 142 lb (64.4 kg)  10/13/19 144 lb (65.3 kg)     Exam:    Vital Signs:  Ht 5' 4" (1.626 m)   Wt 142 lb (64.4 kg)   BMI 24.37 kg/m    Well nourished, well developed male in no  acute distress.   ASSESSMENT & PLAN:    1.  Aortic stenosis-continues to have increased DOE and increased fatigue.  TAVR procedure discussed on 10/20/2019 visit.  He agrees to continue with procedure, understands risks, benefits, all questions answered. 40 mg Lasix day before and day of surgery then, resume as needed schedule Hold glipizide 48 hours prior to procedure  The patient understands that risks include but are not limited to stroke (1 in 1000), death (1 in 1000), kidney failure [usually temporary] (1 in 500), bleeding (1 in 200), allergic reaction [possibly serious] (1 in 200), and agrees to proceed.    CAD-no chest pain today.  History of CABG 1997. Continue clopidogrel Continue metoprolol succinate 25 mg daily Continue nitroglycerin 0.4 mg sublingual as needed Continue pravastatin 40 mg tablet daily Heart healthy low-sodium diet Increase physical activity as tolerated  Permanent atrial fibrillation-heart rate today unable to obtain.  Patient declines anticoagulation and understands risks of CVA. Continue metoprolol succinate 25 mg daily Continue clopidogrel Avoid triggers caffeine, chocolate, EtOH, etc.  Essential hypertension-BP today unable to obtain today. Continue metoprolol succinate 25 mg daily Continue furosemide 40 mg tablet daily Continue doxazosin 8 mg tablet daily Heart healthy low-sodium diet-salty 6 given Increase physical activity as tolerated  Hyperlipidemia-LDL 67 09/21/2019 Continue  pravastatin 40 mg tablet daily Heart healthy low-sodium high-fiber diet Increase physical activity as tolerated  Disposition: Follow-up with Dr. Stanford Breed after surgery.  COVID-19 Education: The signs and symptoms of COVID-19 were discussed with the patient and how to seek care for testing (follow up with PCP or arrange E-visit).  The importance of social distancing was discussed today.  Patient Risk:   After full review of this patients clinical status, I feel that they are at least moderate risk at this time.  Time:  Today, I have spent 18 minutes with the patient with telehealth technology discussing cardiac catheterization, TAVR procedure, medications.     Medication Adjustments/Labs and Tests Ordered: Current medicines are reviewed at length with the patient today.  Concerns regarding medicines are outlined above.   Tests Ordered: No orders of the defined types were placed in this encounter.  Medication Changes: No orders of the defined types were placed in this encounter.   Disposition:  in 2 week(s)  Signed, Jossie Ng. [facility] [address] [address] Office [phone] Fax [phone]

## 2019-11-24 NOTE — H&P (View-Only) (Signed)
 Virtual Visit via Telephone Note   This visit type was conducted due to national recommendations for restrictions regarding the COVID-19 Pandemic (e.g. social distancing) in an effort to limit this patient's exposure and mitigate transmission in our community.  Due to his co-morbid illnesses, this patient is at least at moderate risk for complications without adequate follow up.  This format is felt to be most appropriate for this patient at this time.  The patient did not have access to video technology/had technical difficulties with video requiring transitioning to audio format only (telephone).  All issues noted in this document were discussed and addressed.  No physical exam could be performed with this format.  Please refer to the patient's chart for his  consent to telehealth for [facility].  Evaluation Performed:  Follow-up visit  This visit type was conducted due to national recommendations for restrictions regarding the COVID-19 Pandemic (e.g. social distancing).  This format is felt to be most appropriate for this patient at this time.  All issues noted in this document were discussed and addressed.  No physical exam was performed (except for noted visual exam findings with Video Visits).  Please refer to the patient's chart (MyChart message for video visits and phone note for telephone visits) for the patient's consent to telehealth for [facility]  Date:  11/25/2019   ID:  Garrett Hughes, DOB 09/17/1940, MRN [id number]  Patient Location:  [address]  [place] [address]   Provider location:     [facility] [address] Office [phone] Fax [phone]   PCP:  Alexander, Natalie, DO  Cardiologist:  Garrett Crenshaw, MD  Electrophysiologist:  None   Chief Complaint: Follow-up  History of Present Illness:    Garrett Hughes is a 79 y.o. male who presents via audio/video conferencing for a telehealth  visit today.  Patient verified DOB and address.  The patient does not symptoms concerning for COVID-19 infection (fever, chills, cough, or new SHORTNESS OF BREATH).   Garrett Hughes has a past medical history of coronary artery disease (status post CABG 1997), aortic stenosis, and atrial fibrillation.  He is not taking anticoagulation due to refusing and history of GI bleed and urinary tract bleeding.  An abdominal ultrasound 2/19 showed no aneurysm.  His echocardiogram 10/2018 showed normal LV EF, moderate LVH, moderate biatrial enlargement, moderate mitral regurgitation, moderate tricuspid regurgitation, probable moderate aortic stenosis with mean gradient of 25 mmHg, and mild aortic insufficiency.  His echocardiogram 10/2019 showed normal LV function, mild left ventricular hypertrophy, severe left atrial enlargement, moderate mitral regurgitation, moderate tricuspid regurgitation, severe aortic stenosis with a mean gradient of 49 mmHg and mild to moderate aortic insufficiency.  He was seen by Dr. Crenshaw on 10/20/2019.  During that time he was noted to have increased dyspnea with more moderate activities but not routine activities.  He had no orthopnea PND, lower extremity edema, chest discomfort, palpitations, or syncope.  Due to his aortic insufficiency and severe aortic stenosis along with his increasing dyspnea on exertion TAVR procedure was discussed.  He is scheduled for a left and right heart cath on 11/26/2019.  He is seen virtually today for follow-up and states he feels well.  He states he continues to have shortness of breath with minimal exertion.  He has not been able to walk any distance for the last 6 months without developing dyspnea.  He states that when he gets up and moves he must   use a very slow pace.  He does not have any shortness of breath without activity.  He did not take his glipizide and has been taking his Lasix as prescribed.  He was given the opportunity to ask questions and all  questions were answered.  He and his wife agree to proceed with the cardiac catheterization and TAVR procedure.  Today he denies chest pain, increased shortness of breath, lower extremity edema, fatigue, palpitations, melena, hematuria, hemoptysis, diaphoresis, weakness, presyncope, syncope, orthopnea, and PND.   Prior CV studies:   The following studies were reviewed today:  Echocardiogram 10/19/2019  IMPRESSIONS   1. Left ventricular ejection fraction, by estimation, is 55 to 60%. The  left ventricle has normal function. The left ventrical has no regional  wall motion abnormalities. There is mildly increased left ventricular  hypertrophy. Left ventricular diastolic  parameters are indeterminate.  2. Right ventricular systolic function is normal. The right ventricular  size is normal. There is moderately elevated pulmonary artery systolic  pressure.  3. Left atrial size was severely dilated.  4. The mitral valve is degenerative. Moderate mitral valve regurgitation.  5. Tricuspid valve regurgitation is moderate.  6. The aortic valve is bicuspid. Aortic valve regurgitation is mild to  moderate. Severe aortic valve stenosis.  7. The inferior vena cava is normal in size with <50% respiratory  variability, suggesting right atrial pressure of 8 mmHg.   FINDINGS  Left Ventricle: Left ventricular ejection fraction, by estimation, is 55  to 60%. The left ventricle has normal function. The left ventricle has no  regional wall motion abnormalities. The left ventricular internal cavity  size was normal in size. There is  mildly increased left ventricular hypertrophy. Concentric remodeling left  ventricular hypertrophy. Left ventricular diastolic parameters are  indeterminate.   Right Ventricle: The right ventricular size is normal. No increase in  right ventricular wall thickness. Right ventricular systolic function is  normal. There is moderately elevated pulmonary artery systolic  pressure.  The tricuspid regurgitant velocity is  3.61 m/s, and with an assumed right atrial pressure of 8 mmHg, the  estimated right ventricular systolic pressure is 60.1 mmHg.   Left Atrium: Left atrial size was severely dilated.   Right Atrium: Right atrial size was normal in size.   Pericardium: There is no evidence of pericardial effusion.   Mitral Valve: The mitral valve is degenerative in appearance. There is  moderate thickening of the anterior and posterior mitral valve leaflet(s).  There is mild calcification of the anterior mitral valve leaflet(s).  Normal mobility of the mitral valve  leaflets. Moderate mitral annular calcification. Moderate mitral valve  regurgitation. No evidence of mitral valve stenosis.   Tricuspid Valve: The tricuspid valve is normal in structure. Tricuspid  valve regurgitation is moderate . No evidence of tricuspid stenosis.   Aortic Valve: The aortic valve is bicuspid and severely retricted. Aortic  valve regurgitation is mild to moderate. Aortic regurgitation PHT measures  284 msec. Severe aortic stenosis is present. Mild aortic valve annular  calcification. There is moderate  thickening of the aortic valve. Aortic valve mean gradient measures 39.4  mmHg. Aortic valve peak gradient measures 59.4 mmHg. Aortic valve area, by  VTI measures 0.57 cm.   Pulmonic Valve: The pulmonic valve was normal in structure. Pulmonic valve  regurgitation is mild. No evidence of pulmonic stenosis.   Aorta: The aortic root is normal in size and structure.   Venous: The pulmonary veins were not well visualized. The inferior vena    cava is normal in size with less than 50% respiratory variability,  suggesting right atrial pressure of 8 mmHg. The inferior vena cava and the  hepatic vein show a normal flow pattern.   IAS/Shunts: No atrial level shunt detected by color flow Doppler.  Past Medical History:  Diagnosis Date  . A-fib (HCC) 12/20/2015  . Aortic  stenosis, mild 01/01/2017  . Atherosclerosis of native coronary artery of native heart without angina pectoris 01/05/2016  . Benign essential hypertension 01/05/2016  . Cataract, nuclear, right 05/06/2014  . Current use of long term anticoagulation 12/20/2015  . Diabetic kidney disease (HCC) 01/05/2016  . GERD (gastroesophageal reflux disease)   . History of Barrett's esophagus 12/20/2015  . 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 Hyperlipemia 10/26/2015  . Low back pain 05/02/2016  . Type 2 diabetes mellitus with kidney complication, without long-term current use of insulin (HCC) 07/03/2016   Past Surgical History:  Procedure Laterality Date  . BACK SURGERY  2004  . CARDIAC SURGERY  1997  . SHOULDER SURGERY  2005     Current Meds  Medication Sig  . ACCU-CHEK SOFTCLIX LANCETS lancets Use as instructed to check blood sugar up to qid. Dx Code E11.22.  . Alcohol Swabs PADS Use as instructed to check blood sugar up to qid. Dx code E11.22  . AMBULATORY NON FORMULARY MEDICATION Medication Name: accu-chek aviva plus meter, accu-check aviva plus test strips & accu-chek softclix lancets to test twice a day. Dx: E11.29  . aspirin 81 MG chewable tablet Chew by mouth daily.  . Blood Glucose Calibration (ACCU-CHEK AVIVA) SOLN Use as instructed per manufacturer directions. Dx code E11.22  . Blood Glucose Monitoring Suppl (ACCU-CHEK AVIVA PLUS) w/Device KIT Use as instructed to check blood sugar up to qid. Dx code E11.22  . clopidogrel (PLAVIX) 75 MG tablet TAKE 1 TABLET BY MOUTH  DAILY (Patient taking differently: Take 75 mg by mouth daily. )  . doxazosin (CARDURA) 8 MG tablet TAKE 1 TABLET BY MOUTH  DAILY (Patient taking differently: Take 8 mg by mouth daily. )  . erythromycin ophthalmic ointment Place 1 application into both eyes at bedtime as needed (irritation).  . ferrous sulfate 325 (65 FE) MG EC tablet Take 1 tablet (325 mg total) by mouth daily with breakfast.    . furosemide (LASIX) 40 MG tablet MAINTENANCE: TAKE 1 TABLET  BY MOUTH DAILY. IF  INCREASED SWELLING OR  SHORTNESS OF BREATH: 1 TAB  BY MOUTH TWICE DAILY (Patient taking differently: Take 40 mg by mouth daily. )  . glipiZIDE (GLUCOTROL) 5 MG tablet TAKE 1 TABLET BY MOUTH  TWICE A DAY BEFORE MEALS (Patient taking differently: Take 5 mg by mouth 2 (two) times daily before a meal. )  . glucose blood (ACCU-CHEK AVIVA PLUS) test strip Use as instructed to check blood sugar up to qid. Dx Code E11.22. Disp generic per pt preference / insurance coverage  . Lancets (ONETOUCH ULTRASOFT) lancets Use as instructed to check blood sugar up to qid. Dx: E11.22  . lisinopril (ZESTRIL) 5 MG tablet TAKE 1 TABLET BY MOUTH  DAILY (Patient taking differently: Take 5 mg by mouth daily. )  . metFORMIN (GLUCOPHAGE) 1000 MG tablet TAKE 1 TABLET BY MOUTH  TWICE DAILY WITH A MEAL (Patient taking differently: Take 1,000 mg by mouth 2 (two) times daily with a meal. )  . metoprolol succinate (TOPROL-XL) 25 MG 24 hr tablet TAKE 1 TABLET   BY MOUTH  DAILY (Patient taking differently: Take 25 mg by mouth daily. )  . Multiple Vitamin (MULTI-VITAMIN DAILY PO) Take by mouth.  . nitroGLYCERIN (NITROSTAT) 0.4 MG SL tablet Place 1 tablet (0.4 mg total) under the tongue every 5 (five) minutes as needed for chest pain.  . pantoprazole (PROTONIX) 20 MG tablet TAKE 1 TABLET BY MOUTH  DAILY (Patient taking differently: Take 20 mg by mouth daily. )  . pravastatin (PRAVACHOL) 40 MG tablet TAKE 1 TABLET BY MOUTH  DAILY (Patient taking differently: Take 40 mg by mouth daily. )   Current Facility-Administered Medications for the 11/25/19 encounter (Telemedicine) with Larnce Schnackenberg M, NP  Medication  . sodium chloride flush (NS) 0.9 % injection 3 mL     Allergies:   Atorvastatin, Cholestyramine, and Pravastatin   Social History   Tobacco Use  . Smoking status: Former Smoker    Quit date: 09/10/1987    Years since quitting: 32.2  . Smokeless  tobacco: Never Used  Substance Use Topics  . Alcohol use: No  . Drug use: No     Family Hx: The patient's family history includes CAD in his father. There is no history of Colon cancer or Heart attack.  ROS:   Please see the history of present illness.     All other systems reviewed and are negative.   Labs/Other Tests and Data Reviewed:    Recent Labs: 03/17/2019: Brain Natriuretic Peptide 381 09/21/2019: ALT 17; Magnesium 1.5 11/23/2019: BUN 27; Creat 1.25; Hemoglobin 10.8; Platelets 141; Potassium 4.4; Sodium 137   Recent Lipid Panel Lab Results  Component Value Date/Time   CHOL 145 09/21/2019 09:22 AM   TRIG 71 09/21/2019 09:22 AM   HDL 63 09/21/2019 09:22 AM   CHOLHDL 2.3 09/21/2019 09:22 AM   LDLCALC 67 09/21/2019 09:22 AM    Wt Readings from Last 3 Encounters:  11/25/19 142 lb (64.4 kg)  10/20/19 142 lb (64.4 kg)  10/13/19 144 lb (65.3 kg)     Exam:    Vital Signs:  Ht 5' 4" (1.626 m)   Wt 142 lb (64.4 kg)   BMI 24.37 kg/m    Well nourished, well developed male in no  acute distress.   ASSESSMENT & PLAN:    1.  Aortic stenosis-continues to have increased DOE and increased fatigue.  TAVR procedure discussed on 10/20/2019 visit.  He agrees to continue with procedure, understands risks, benefits, all questions answered. 40 mg Lasix day before and day of surgery then, resume as needed schedule Hold glipizide 48 hours prior to procedure  The patient understands that risks include but are not limited to stroke (1 in 1000), death (1 in 1000), kidney failure [usually temporary] (1 in 500), bleeding (1 in 200), allergic reaction [possibly serious] (1 in 200), and agrees to proceed.    CAD-no chest pain today.  History of CABG 1997. Continue clopidogrel Continue metoprolol succinate 25 mg daily Continue nitroglycerin 0.4 mg sublingual as needed Continue pravastatin 40 mg tablet daily Heart healthy low-sodium diet Increase physical activity as  tolerated  Permanent atrial fibrillation-heart rate today unable to obtain.  Patient declines anticoagulation and understands risks of CVA. Continue metoprolol succinate 25 mg daily Continue clopidogrel Avoid triggers caffeine, chocolate, EtOH, etc.  Essential hypertension-BP today unable to obtain today. Continue metoprolol succinate 25 mg daily Continue furosemide 40 mg tablet daily Continue doxazosin 8 mg tablet daily Heart healthy low-sodium diet-salty 6 given Increase physical activity as tolerated  Hyperlipidemia-LDL 67 09/21/2019 Continue   09/21/2019 Continue pravastatin 40 mg tablet daily Heart healthy low-sodium high-fiber diet Increase physical activity as tolerated  Disposition: Follow-up with Dr. Stanford Breed after surgery.  COVID-19 Education: The signs and symptoms of COVID-19 were discussed with the patient and how to seek care for testing (follow up with PCP or arrange E-visit).  The importance of social distancing was discussed today.  Patient Risk:   After full review of this patients clinical status, I feel that they are at least moderate risk at this time.  Time:   Today, I have spent 18 minutes with the patient with telehealth technology discussing cardiac catheterization, TAVR procedure, medications.     Medication Adjustments/Labs and Tests Ordered: Current medicines are reviewed at length with the patient today.  Concerns regarding medicines are outlined above.   Tests Ordered: No orders of the defined types were placed in this encounter.  Medication Changes: No orders of the defined types were placed in this encounter.   Disposition:  in 2 week(s)  Signed, Jossie Ng. [facility] [place] [address] Office [phone] Fax [phone]

## 2019-11-24 NOTE — H&P (View-Only) (Signed)
Virtual Visit via Telephone Note   This visit type was conducted due to national recommendations for restrictions regarding the COVID-19 Pandemic (e.g. social distancing) in an effort to limit this patient's exposure and mitigate transmission in our community.  Due to his co-morbid illnesses, this patient is at least at moderate risk for complications without adequate follow up.  This format is felt to be most appropriate for this patient at this time.  The patient did not have access to video technology/had technical difficulties with video requiring transitioning to audio format only (telephone).  All issues noted in this document were discussed and addressed.  No physical exam could be performed with this format.  Please refer to the patient's chart for his  consent to telehealth for [facility].  Evaluation Performed:  Follow-up visit  This visit type was conducted due to national recommendations for restrictions regarding the COVID-19 Pandemic (e.g. social distancing).  This format is felt to be most appropriate for this patient at this time.  All issues noted in this document were discussed and addressed.  No physical exam was performed (except for noted visual exam findings with Video Visits).  Please refer to the patient's chart (MyChart message for video visits and phone note for telephone visits) for the patient's consent to telehealth for [facility]  Date:  11/25/2019   ID:  Garrett Hughes Hughes, DOB August 30, 1941, MRN [id number]  Patient Location:  [place] [address]   Provider location:     [place] [address] Office [phone] Fax [phone]   PCP:  Emeterio Reeve, DO  Cardiologist:  Kirk Ruths, MD  Electrophysiologist:  None   Chief Complaint: Follow-up  History of Present Illness:    Garrett Hughes Hughes is a 79 y.o. male who presents via audio/video conferencing for a telehealth  visit today.  Patient verified DOB and address.  The patient does not symptoms concerning for COVID-19 infection (fever, chills, cough, or new SHORTNESS OF BREATH).   Garrett Hughes Hughes has a past medical history of coronary artery disease (status post CABG 1997), aortic stenosis, and atrial fibrillation.  He is not taking anticoagulation due to refusing and history of GI bleed and urinary tract bleeding.  An abdominal ultrasound 2/19 showed no aneurysm.  His echocardiogram 10/2018 showed normal LV EF, moderate LVH, moderate biatrial enlargement, moderate mitral regurgitation, moderate tricuspid regurgitation, probable moderate aortic stenosis with mean gradient of 25 mmHg, and mild aortic insufficiency.  His echocardiogram 10/2019 showed normal LV function, mild left ventricular hypertrophy, severe left atrial enlargement, moderate mitral regurgitation, moderate tricuspid regurgitation, severe aortic stenosis with a mean gradient of 49 mmHg and mild to moderate aortic insufficiency.  He was seen by Dr. Stanford Breed on 10/20/2019.  During that time he was noted to have increased dyspnea with more moderate activities but not routine activities.  He had no orthopnea PND, lower extremity edema, chest discomfort, palpitations, or syncope.  Due to his aortic insufficiency and severe aortic stenosis along with his increasing dyspnea on exertion TAVR procedure was discussed.  He is scheduled for a left and right heart cath on 11/26/2019.  He is seen virtually today for follow-up and states he feels well.  He states he continues to have shortness of breath with minimal exertion.  He has not been able to walk any distance for the last 6 months without developing dyspnea.  He states that when he gets up and moves he must  use a very slow pace.  He does not have any shortness of breath without activity.  He did not take his glipizide and has been taking his Lasix as prescribed.  He was given the opportunity to ask questions and all  questions were answered.  He and his wife agree to proceed with the cardiac catheterization and TAVR procedure.  Today he denies chest pain, increased shortness of breath, lower extremity edema, fatigue, palpitations, melena, hematuria, hemoptysis, diaphoresis, weakness, presyncope, syncope, orthopnea, and PND.   Prior CV studies:   The following studies were reviewed today:  Echocardiogram 10/19/2019  IMPRESSIONS   1. Left ventricular ejection fraction, by estimation, is 55 to 60%. The  left ventricle has normal function. The left ventrical has no regional  wall motion abnormalities. There is mildly increased left ventricular  hypertrophy. Left ventricular diastolic  parameters are indeterminate.  2. Right ventricular systolic function is normal. The right ventricular  size is normal. There is moderately elevated pulmonary artery systolic  pressure.  3. Left atrial size was severely dilated.  4. The mitral valve is degenerative. Moderate mitral valve regurgitation.  5. Tricuspid valve regurgitation is moderate.  6. The aortic valve is bicuspid. Aortic valve regurgitation is mild to  moderate. Severe aortic valve stenosis.  7. The inferior vena cava is normal in size with <50% respiratory  variability, suggesting right atrial pressure of 8 mmHg.   FINDINGS  Left Ventricle: Left ventricular ejection fraction, by estimation, is 55  to 60%. The left ventricle has normal function. The left ventricle has no  regional wall motion abnormalities. The left ventricular internal cavity  size was normal in size. There is  mildly increased left ventricular hypertrophy. Concentric remodeling left  ventricular hypertrophy. Left ventricular diastolic parameters are  indeterminate.   Right Ventricle: The right ventricular size is normal. No increase in  right ventricular wall thickness. Right ventricular systolic function is  normal. There is moderately elevated pulmonary artery systolic  pressure.  The tricuspid regurgitant velocity is  3.61 m/s, and with an assumed right atrial pressure of 8 mmHg, the  estimated right ventricular systolic pressure is 44.3 mmHg.   Left Atrium: Left atrial size was severely dilated.   Right Atrium: Right atrial size was normal in size.   Pericardium: There is no evidence of pericardial effusion.   Mitral Valve: The mitral valve is degenerative in appearance. There is  moderate thickening of the anterior and posterior mitral valve leaflet(s).  There is mild calcification of the anterior mitral valve leaflet(s).  Normal mobility of the mitral valve  leaflets. Moderate mitral annular calcification. Moderate mitral valve  regurgitation. No evidence of mitral valve stenosis.   Tricuspid Valve: The tricuspid valve is normal in structure. Tricuspid  valve regurgitation is moderate . No evidence of tricuspid stenosis.   Aortic Valve: The aortic valve is bicuspid and severely retricted. Aortic  valve regurgitation is mild to moderate. Aortic regurgitation PHT measures  284 msec. Severe aortic stenosis is present. Mild aortic valve annular  calcification. There is moderate  thickening of the aortic valve. Aortic valve mean gradient measures 39.4  mmHg. Aortic valve peak gradient measures 59.4 mmHg. Aortic valve area, by  VTI measures 0.57 cm.   Pulmonic Valve: The pulmonic valve was normal in structure. Pulmonic valve  regurgitation is mild. No evidence of pulmonic stenosis.   Aorta: The aortic root is normal in size and structure.   Venous: The pulmonary veins were not well visualized. The inferior vena  cava is normal in size with less than 50% respiratory variability,  suggesting right atrial pressure of 8 mmHg. The inferior vena cava and the  hepatic vein show a normal flow pattern.   IAS/Shunts: No atrial level shunt detected by color flow Doppler.  Past Medical History:  Diagnosis Date  . A-fib ([place]) 12/20/2015  . Aortic  stenosis, mild 01/01/2017  . Atherosclerosis of native coronary artery of native heart without angina pectoris 01/05/2016  . Benign essential hypertension 01/05/2016  . Cataract, nuclear, right 05/06/2014  . Current use of long term anticoagulation 12/20/2015  . Diabetic kidney disease ([place]) 01/05/2016  . GERD (gastroesophageal reflux disease)   . History of Barrett's esophagus 12/20/2015  . 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 Hyperlipemia 10/26/2015  . Low back pain 05/02/2016  . Type 2 diabetes mellitus with kidney complication, without long-term current use of insulin ([place]) 07/03/2016   Past Surgical History:  Procedure Laterality Date  . BACK SURGERY  2004  . [place]  . SHOULDER SURGERY  2005     Current Meds  Medication Sig  . ACCU-CHEK SOFTCLIX LANCETS lancets Use as instructed to check blood sugar up to qid. Dx Code E11.22.  Marland Kitchen Alcohol Swabs PADS Use as instructed to check blood sugar up to qid. Dx code E11.22  . AMBULATORY NON FORMULARY MEDICATION Medication Name: accu-chek aviva plus meter, accu-check aviva plus test strips & accu-chek softclix lancets to test twice a day. Dx: E11.29  . aspirin 81 MG chewable tablet Chew by mouth daily.  . Blood Glucose Calibration (ACCU-CHEK AVIVA) SOLN Use as instructed per manufacturer directions. Dx code E11.22  . Blood Glucose Monitoring Suppl (ACCU-CHEK AVIVA PLUS) w/Device KIT Use as instructed to check blood sugar up to qid. Dx code E11.22  . clopidogrel (PLAVIX) 75 MG tablet TAKE 1 TABLET BY MOUTH  DAILY (Patient taking differently: Take 75 mg by mouth daily. )  . doxazosin (CARDURA) 8 MG tablet TAKE 1 TABLET BY MOUTH  DAILY (Patient taking differently: Take 8 mg by mouth daily. )  . erythromycin ophthalmic ointment Place 1 application into both eyes at bedtime as needed (irritation).  . ferrous sulfate 325 (65 FE) MG EC tablet Take 1 tablet (325 mg total) by mouth daily with breakfast.    . furosemide (LASIX) 40 MG tablet MAINTENANCE: TAKE 1 TABLET  BY MOUTH DAILY. IF  INCREASED SWELLING OR  SHORTNESS OF BREATH: 1 TAB  BY MOUTH TWICE DAILY (Patient taking differently: Take 40 mg by mouth daily. )  . glipiZIDE (GLUCOTROL) 5 MG tablet TAKE 1 TABLET BY MOUTH  TWICE A DAY BEFORE MEALS (Patient taking differently: Take 5 mg by mouth 2 (two) times daily before a meal. )  . glucose blood (ACCU-CHEK AVIVA PLUS) test strip Use as instructed to check blood sugar up to qid. Dx Code E11.22. Disp generic per pt preference / insurance coverage  . Lancets (ONETOUCH ULTRASOFT) lancets Use as instructed to check blood sugar up to qid. Dx: E11.22  . lisinopril (ZESTRIL) 5 MG tablet TAKE 1 TABLET BY MOUTH  DAILY (Patient taking differently: Take 5 mg by mouth daily. )  . metFORMIN (GLUCOPHAGE) 1000 MG tablet TAKE 1 TABLET BY MOUTH  TWICE DAILY WITH A MEAL (Patient taking differently: Take 1,000 mg by mouth 2 (two) times daily with a meal. )  . metoprolol succinate (TOPROL-XL) 25 MG 24 hr tablet TAKE 1 TABLET  BY MOUTH  DAILY (Patient taking differently: Take 25 mg by mouth daily. )  . Multiple Vitamin (MULTI-VITAMIN DAILY PO) Take by mouth.  . nitroGLYCERIN (NITROSTAT) 0.4 MG SL tablet Place 1 tablet (0.4 mg total) under the tongue every 5 (five) minutes as needed for chest pain.  . pantoprazole (PROTONIX) 20 MG tablet TAKE 1 TABLET BY MOUTH  DAILY (Patient taking differently: Take 20 mg by mouth daily. )  . pravastatin (PRAVACHOL) 40 MG tablet TAKE 1 TABLET BY MOUTH  DAILY (Patient taking differently: Take 40 mg by mouth daily. )   Current Facility-Administered Medications for the 11/25/19 encounter (Telemedicine) with Deberah Pelton, NP  Medication  . sodium chloride flush (NS) 0.9 % injection 3 mL     Allergies:   Atorvastatin, Cholestyramine, and Pravastatin   Social History   Tobacco Use  . Smoking status: Former Smoker    Quit date: 09/10/1987    Years since quitting: 32.2  . Smokeless  tobacco: Never Used  Substance Use Topics  . Alcohol use: No  . Drug use: No     Family Hx: The patient's family history includes CAD in his father. There is no history of Colon cancer or Heart attack.  ROS:   Please see the history of present illness.     All other systems reviewed and are negative.   Labs/Other Tests and Data Reviewed:    Recent Labs: 03/17/2019: Brain Natriuretic Peptide 381 09/21/2019: ALT 17; Magnesium 1.5 11/23/2019: BUN 27; Creat 1.25; Hemoglobin 10.8; Platelets 141; Potassium 4.4; Sodium 137   Recent Lipid Panel Lab Results  Component Value Date/Time   CHOL 145 09/21/2019 09:22 AM   TRIG 71 09/21/2019 09:22 AM   HDL 63 09/21/2019 09:22 AM   CHOLHDL 2.3 09/21/2019 09:22 AM   LDLCALC 67 09/21/2019 09:22 AM    Wt Readings from Last 3 Encounters:  11/25/19 142 lb (64.4 kg)  10/20/19 142 lb (64.4 kg)  10/13/19 144 lb (65.3 kg)     Exam:    Vital Signs:  Ht 5' 4" (1.626 m)   Wt 142 lb (64.4 kg)   BMI 24.37 kg/m    Well nourished, well developed male in no  acute distress.   ASSESSMENT & PLAN:    1.  Aortic stenosis-continues to have increased DOE and increased fatigue.  TAVR procedure discussed on 10/20/2019 visit.  He agrees to continue with procedure, understands risks, benefits, all questions answered. 40 mg Lasix day before and day of surgery then, resume as needed schedule Hold glipizide 48 hours prior to procedure  The patient understands that risks include but are not limited to stroke (1 in 1000), death (1 in 1000), kidney failure [usually temporary] (1 in 500), bleeding (1 in 200), allergic reaction [possibly serious] (1 in 200), and agrees to proceed.    CAD-no chest pain today.  History of CABG 1997. Continue clopidogrel Continue metoprolol succinate 25 mg daily Continue nitroglycerin 0.4 mg sublingual as needed Continue pravastatin 40 mg tablet daily Heart healthy low-sodium diet Increase physical activity as  tolerated  Permanent atrial fibrillation-heart rate today unable to obtain.  Patient declines anticoagulation and understands risks of CVA. Continue metoprolol succinate 25 mg daily Continue clopidogrel Avoid triggers caffeine, chocolate, EtOH, etc.  Essential hypertension-BP today unable to obtain today. Continue metoprolol succinate 25 mg daily Continue furosemide 40 mg tablet daily Continue doxazosin 8 mg tablet daily Heart healthy low-sodium diet-salty 6 given Increase physical activity as tolerated  Hyperlipidemia-LDL 67 09/21/2019 Continue  pravastatin 40 mg tablet daily Heart healthy low-sodium high-fiber diet Increase physical activity as tolerated  Disposition: Follow-up with Dr. Stanford Breed after surgery.  COVID-19 Education: The signs and symptoms of COVID-19 were discussed with the patient and how to seek care for testing (follow up with PCP or arrange E-visit).  The importance of social distancing was discussed today.  Patient Risk:   After full review of this patients clinical status, I feel that they are at least moderate risk at this time.  Time:   Today, I have spent 18 minutes with the patient with telehealth technology discussing cardiac catheterization, TAVR procedure, medications.     Medication Adjustments/Labs and Tests Ordered: Current medicines are reviewed at length with the patient today.  Concerns regarding medicines are outlined above.   Tests Ordered: No orders of the defined types were placed in this encounter.  Medication Changes: No orders of the defined types were placed in this encounter.   Disposition:  in 2 week(s)  Signed, Jossie Ng. [facility] [place] [address] Office [phone] Fax [phone]

## 2019-11-25 ENCOUNTER — Telehealth: Payer: Self-pay | Admitting: *Deleted

## 2019-11-25 ENCOUNTER — Telehealth (INDEPENDENT_AMBULATORY_CARE_PROVIDER_SITE_OTHER): Payer: Medicare Other | Admitting: General Practice

## 2019-11-25 VITALS — Ht 64.0 in | Wt 142.0 lb

## 2019-11-25 DIAGNOSIS — I350 Nonrheumatic aortic (valve) stenosis: Secondary | ICD-10-CM | POA: Diagnosis not present

## 2019-11-25 DIAGNOSIS — I2510 Atherosclerotic heart disease of native coronary artery without angina pectoris: Secondary | ICD-10-CM | POA: Diagnosis not present

## 2019-11-25 DIAGNOSIS — I4821 Permanent atrial fibrillation: Secondary | ICD-10-CM

## 2019-11-25 DIAGNOSIS — E7800 Pure hypercholesterolemia, unspecified: Secondary | ICD-10-CM | POA: Diagnosis not present

## 2019-11-25 DIAGNOSIS — I10 Essential (primary) hypertension: Secondary | ICD-10-CM

## 2019-11-25 NOTE — Telephone Encounter (Signed)
Pt contacted pre-catheterization scheduled at [facility] for: Friday November 26, 2019 10:30 AM Verified arrival time and place: [facility] Main Entrance [facility]) at: 8:30 AM   No solid food after midnight prior to cath, clear liquids until 5 AM day of procedure.  Hold: Lasix-day before and day of procedure. Glipizide-AM of procedure Metformin-day of procedure and 48 hours post procedure  Except hold medications AM meds can be  taken pre-cath with sip of water including: ASA 81 mg Plavix 75 mg  Confirmed patient has responsible adult to drive home post procedure and observe 24 hours after arriving home: yes  Currently, due to Covid-19 pandemic, only one person will be allowed with patient. Must be the same person for patient's entire stay and will be required to wear a mask. They will be asked to wait in the waiting room for the duration of the patient's stay.  Patients are required to wear a mask when they enter the hospital.      COVID-19 Pre-Screening Questions:  . In the past 7 to 10 days have you had a cough,  shortness of breath, headache, congestion, fever (100 or greater) body aches, chills, sore throat, or sudden loss of taste or sense of smell? Shortness of breath, not new . Have you been around anyone with known Covid 19 in the past 7-10 days? no . Have you been around anyone who is awaiting Covid 19 test results in the past 7 to 10 days? no . Have you been around anyone who has been exposed to Covid 19, or has mentioned symptoms of Covid 19 within the past 7 to 10 days? No  I reviewed procedure/mask/visitor instructions, COVID-19 screening questions with patient, he verbalized understanding,thanked me for call.

## 2019-11-25 NOTE — Patient Instructions (Addendum)
Special Instructions: CONTINUE WITH CATH INSTRUCTION AS NOTED AT 10-20-2019 OFFICE APPT  Follow-Up: 12-29-2019 Office Visit  @ 220pm with Olga Millers, MD

## 2019-11-26 ENCOUNTER — Ambulatory Visit (HOSPITAL_COMMUNITY)
Admission: RE | Admit: 2019-11-26 | Discharge: 2019-11-26 | Disposition: A | Discharge status: Home / Self Care | Payer: Medicare Other | Attending: Cardiovascular Disease | Admitting: Cardiovascular Disease

## 2019-11-26 ENCOUNTER — Encounter: Payer: Self-pay | Admitting: Physician Assistant

## 2019-11-26 ENCOUNTER — Encounter (HOSPITAL_COMMUNITY)
Admission: RE | Disposition: A | Discharge status: Home / Self Care | Payer: Self-pay | Source: Home / Self Care | Attending: Cardiovascular Disease

## 2019-11-26 ENCOUNTER — Other Ambulatory Visit: Payer: Self-pay | Admitting: Physician Assistant

## 2019-11-26 ENCOUNTER — Other Ambulatory Visit: Payer: Self-pay

## 2019-11-26 DIAGNOSIS — Z87891 Personal history of nicotine dependence: Secondary | ICD-10-CM | POA: Diagnosis not present

## 2019-11-26 DIAGNOSIS — E785 Hyperlipidemia, unspecified: Secondary | ICD-10-CM | POA: Insufficient documentation

## 2019-11-26 DIAGNOSIS — Z7901 Long term (current) use of anticoagulants: Secondary | ICD-10-CM | POA: Diagnosis not present

## 2019-11-26 DIAGNOSIS — I2584 Coronary atherosclerosis due to calcified coronary lesion: Secondary | ICD-10-CM | POA: Diagnosis not present

## 2019-11-26 DIAGNOSIS — Z79899 Other long term (current) drug therapy: Secondary | ICD-10-CM | POA: Insufficient documentation

## 2019-11-26 DIAGNOSIS — Z951 Presence of aortocoronary bypass graft: Secondary | ICD-10-CM | POA: Diagnosis not present

## 2019-11-26 DIAGNOSIS — Z7982 Long term (current) use of aspirin: Secondary | ICD-10-CM | POA: Insufficient documentation

## 2019-11-26 DIAGNOSIS — I119 Hypertensive heart disease without heart failure: Secondary | ICD-10-CM | POA: Insufficient documentation

## 2019-11-26 DIAGNOSIS — Z7984 Long term (current) use of oral hypoglycemic drugs: Secondary | ICD-10-CM | POA: Diagnosis not present

## 2019-11-26 DIAGNOSIS — Z8249 Family history of ischemic heart disease and other diseases of the circulatory system: Secondary | ICD-10-CM | POA: Diagnosis not present

## 2019-11-26 DIAGNOSIS — Z7902 Long term (current) use of antithrombotics/antiplatelets: Secondary | ICD-10-CM | POA: Diagnosis not present

## 2019-11-26 DIAGNOSIS — K219 Gastro-esophageal reflux disease without esophagitis: Secondary | ICD-10-CM | POA: Diagnosis not present

## 2019-11-26 DIAGNOSIS — I4891 Unspecified atrial fibrillation: Secondary | ICD-10-CM | POA: Insufficient documentation

## 2019-11-26 DIAGNOSIS — E1136 Type 2 diabetes mellitus with diabetic cataract: Secondary | ICD-10-CM | POA: Insufficient documentation

## 2019-11-26 DIAGNOSIS — I350 Nonrheumatic aortic (valve) stenosis: Secondary | ICD-10-CM

## 2019-11-26 DIAGNOSIS — I2510 Atherosclerotic heart disease of native coronary artery without angina pectoris: Secondary | ICD-10-CM

## 2019-11-26 HISTORY — PX: RIGHT HEART CATH AND CORONARY/GRAFT ANGIOGRAPHY: CATH118265

## 2019-11-26 LAB — POCT I-STAT 7, (LYTES, BLD GAS, ICA,H+H)
Acid-base deficit: 1 mmol/L (ref 0.0–2.0)
Bicarbonate: 23.2 mmol/L (ref 20.0–28.0)
Calcium, Ion: 1.11 mmol/L — ABNORMAL LOW (ref 1.15–1.40)
HCT: 29 % — ABNORMAL LOW (ref 39.0–52.0)
Hemoglobin: 9.9 g/dL — ABNORMAL LOW (ref 13.0–17.0)
O2 Saturation: 96 %
Potassium: 3.8 mmol/L (ref 3.5–5.1)
Sodium: 141 mmol/L (ref 135–145)
TCO2: 24 mmol/L (ref 22–32)
pCO2 arterial: 33.8 mmHg (ref 32.0–48.0)
pH, Arterial: 7.444 (ref 7.350–7.450)
pO2, Arterial: 77 mmHg — ABNORMAL LOW (ref 83.0–108.0)

## 2019-11-26 LAB — POCT I-STAT EG7
Acid-Base Excess: 1 mmol/L (ref 0.0–2.0)
Bicarbonate: 25.4 mmol/L (ref 20.0–28.0)
Calcium, Ion: 1.12 mmol/L — ABNORMAL LOW (ref 1.15–1.40)
HCT: 28 % — ABNORMAL LOW (ref 39.0–52.0)
Hemoglobin: 9.5 g/dL — ABNORMAL LOW (ref 13.0–17.0)
O2 Saturation: 66 %
Potassium: 3.8 mmol/L (ref 3.5–5.1)
Sodium: 142 mmol/L (ref 135–145)
TCO2: 27 mmol/L (ref 22–32)
pCO2, Ven: 41.1 mmHg — ABNORMAL LOW (ref 44.0–60.0)
pH, Ven: 7.399 (ref 7.250–7.430)
pO2, Ven: 34 mmHg (ref 32.0–45.0)

## 2019-11-26 LAB — GLUCOSE, CAPILLARY: Glucose-Capillary: 154 mg/dL — ABNORMAL HIGH (ref 70–99)

## 2019-11-26 SURGERY — RIGHT HEART CATH AND CORONARY/GRAFT ANGIOGRAPHY
Anesthesia: LOCAL

## 2019-11-26 MED ORDER — SODIUM CHLORIDE 0.9 % WEIGHT BASED INFUSION: 1.0000 mL/kg/h | INTRAVENOUS | Status: DC

## 2019-11-26 MED ORDER — LABETALOL HCL 5 MG/ML IV SOLN: 10.0000 mg | INTRAVENOUS | Status: DC | PRN

## 2019-11-26 MED ORDER — SODIUM CHLORIDE 0.9% FLUSH: 3.0000 mL | Freq: Two times a day (BID) | INTRAVENOUS | Status: DC

## 2019-11-26 MED ORDER — HEPARIN (PORCINE) IN NACL 1000-0.9 UT/500ML-% IV SOLN
INTRAVENOUS | Status: AC
  Filled 2019-11-26: qty 1000

## 2019-11-26 MED ORDER — MIDAZOLAM HCL 2 MG/2ML IJ SOLN
INTRAMUSCULAR | Status: AC
  Filled 2019-11-26: qty 2

## 2019-11-26 MED ORDER — ACETAMINOPHEN 325 MG PO TABS: 650.0000 mg | ORAL_TABLET | ORAL | Status: DC | PRN

## 2019-11-26 MED ORDER — FENTANYL CITRATE (PF) 100 MCG/2ML IJ SOLN
INTRAMUSCULAR | Status: AC
  Filled 2019-11-26: qty 2

## 2019-11-26 MED ORDER — FENTANYL CITRATE (PF) 100 MCG/2ML IJ SOLN
INTRAMUSCULAR | Status: DC | PRN
  Administered 2019-11-26: 25 ug via INTRAVENOUS

## 2019-11-26 MED ORDER — SODIUM CHLORIDE 0.9% FLUSH: 3.0000 mL | INTRAVENOUS | Status: DC | PRN

## 2019-11-26 MED ORDER — HYDRALAZINE HCL 20 MG/ML IJ SOLN: 10.0000 mg | INTRAMUSCULAR | Status: DC | PRN

## 2019-11-26 MED ORDER — SODIUM CHLORIDE 0.9 % IV SOLN: 250.0000 mL | INTRAVENOUS | Status: DC | PRN

## 2019-11-26 MED ORDER — LIDOCAINE HCL (PF) 1 % IJ SOLN
INTRAMUSCULAR | Status: AC
  Filled 2019-11-26: qty 30

## 2019-11-26 MED ORDER — IOHEXOL 350 MG/ML SOLN
INTRAVENOUS | Status: DC | PRN
  Administered 2019-11-26: 115 mL via INTRA_ARTERIAL

## 2019-11-26 MED ORDER — HEPARIN SODIUM (PORCINE) 1000 UNIT/ML IJ SOLN
INTRAMUSCULAR | Status: AC
  Filled 2019-11-26: qty 1

## 2019-11-26 MED ORDER — MIDAZOLAM HCL 2 MG/2ML IJ SOLN
INTRAMUSCULAR | Status: DC | PRN
  Administered 2019-11-26: 1 mg via INTRAVENOUS

## 2019-11-26 MED ORDER — HEPARIN (PORCINE) IN NACL 1000-0.9 UT/500ML-% IV SOLN
INTRAVENOUS | Status: DC | PRN
  Administered 2019-11-26 (×2): 500 mL

## 2019-11-26 MED ORDER — IOHEXOL 350 MG/ML SOLN
INTRAVENOUS | Status: AC
  Filled 2019-11-26: qty 1

## 2019-11-26 MED ORDER — LIDOCAINE HCL (PF) 1 % IJ SOLN
INTRAMUSCULAR | Status: DC | PRN
  Administered 2019-11-26: 16 mL

## 2019-11-26 MED ORDER — ONDANSETRON HCL 4 MG/2ML IJ SOLN: 4.0000 mg | Freq: Four times a day (QID) | INTRAMUSCULAR | Status: DC | PRN

## 2019-11-26 MED ORDER — SODIUM CHLORIDE 0.9 % WEIGHT BASED INFUSION
3.0000 mL/kg/h | INTRAVENOUS | Status: AC
  Administered 2019-11-26: 3 mL/kg/h via INTRAVENOUS

## 2019-11-26 MED ORDER — NITROGLYCERIN 0.2 MG/ML ON CALL CATH LAB
INTRAVENOUS | Status: AC
  Filled 2019-11-26: qty 1

## 2019-11-26 MED ORDER — ASPIRIN 81 MG PO CHEW: 81.0000 mg | CHEWABLE_TABLET | ORAL | Status: DC

## 2019-11-26 SURGICAL SUPPLY — 17 items
CATH DXT MULTI JL4 JR4 ANG PIG (CATHETERS) ×1 IMPLANT
CATH INFINITI 5FR JL5 (CATHETERS) ×1 IMPLANT
CATH LAUNCHER 5F EBU3.5 (CATHETERS) ×1 IMPLANT
CATH LAUNCHER 5F EBU4.0 (CATHETERS) ×1 IMPLANT
CATH SWAN GANZ 7F STRAIGHT (CATHETERS) ×1 IMPLANT
CLOSURE MYNX CONTROL 6F/7F (Vascular Products) ×1 IMPLANT
KIT HEART LEFT (KITS) ×2 IMPLANT
PACK CARDIAC CATHETERIZATION (CUSTOM PROCEDURE TRAY) ×2 IMPLANT
PINNACLE LONG 6F 25CM (SHEATH) ×2
SHEATH INTRO PINNACLE 6F 25CM (SHEATH) IMPLANT
SHEATH PINNACLE 5F 10CM (SHEATH) ×1 IMPLANT
SHEATH PINNACLE 6F 10CM (SHEATH) ×1 IMPLANT
SHEATH PINNACLE 7F 10CM (SHEATH) ×1 IMPLANT
SHEATH PROBE COVER 6X72 (BAG) ×1 IMPLANT
TRANSDUCER W/STOPCOCK (MISCELLANEOUS) ×2 IMPLANT
TUBING CIL FLEX 10 FLL-RA (TUBING) ×2 IMPLANT
WIRE HI TORQ VERSACORE-J 145CM (WIRE) ×1 IMPLANT

## 2019-11-26 NOTE — Discharge Instructions (Signed)
HOLD METFORMN /// RESTART Monday 3/22   Angiogram, Care After This sheet gives you information about how to care for yourself after your procedure. Your health care provider may also give you more specific instructions. If you have problems or questions, contact your health care provider. What can I expect after the procedure? After the procedure, it is common to have bruising and tenderness at the catheter insertion area. Follow these instructions at home: Insertion site care  Follow instructions from your health care provider about how to take care of your insertion site. Make sure you: ? Wash your hands with soap and water before you change your bandage (dressing). If soap and water are not available, use hand sanitizer. ? Change your dressing as told by your health care provider. ? Leave stitches (sutures), skin glue, or adhesive strips in place. These skin closures may need to stay in place for 2 weeks or longer. If adhesive strip edges start to loosen and curl up, you may trim the loose edges. Do not remove adhesive strips completely unless your health care provider tells you to do that.  Do not take baths, swim, or use a hot tub until your health care provider approves.  You may shower 24-48 hours after the procedure or as told by your health care provider. ? Gently wash the site with plain soap and water. ? Pat the area dry with a clean towel. ? Do not rub the site. This may cause bleeding.  Do not apply powder or lotion to the site. Keep the site clean and dry.  Check your insertion site every day for signs of infection. Check for: ? Redness, swelling, or pain. ? Fluid or blood. ? Warmth. ? Pus or a bad smell. Activity  Rest as told by your health care provider, usually for 1-2 days.  Do not lift anything that is heavier than 10 lbs. (4.5 kg) or as told by your health care provider.  Do not drive for 24 hours if you were given a medicine to help you relax (sedative).  Do  not drive or use heavy machinery while taking prescription pain medicine. General instructions   Return to your normal activities as told by your health care provider, usually in about a week. Ask your health care provider what activities are safe for you.  If the catheter site starts bleeding, lie flat and put pressure on the site. If the bleeding does not stop, get help right away. This is a medical emergency.  Drink enough fluid to keep your urine clear or pale yellow. This helps flush the contrast dye from your body.  Take over-the-counter and prescription medicines only as told by your health care provider.  Keep all follow-up visits as told by your health care provider. This is important. Contact a health care provider if:  You have a fever or chills.  You have redness, swelling, or pain around your insertion site.  You have fluid or blood coming from your insertion site.  The insertion site feels warm to the touch.  You have pus or a bad smell coming from your insertion site.  You have bruising around the insertion site.  You notice blood collecting in the tissue around the catheter site (hematoma). The hematoma may be painful to the touch. Get help right away if:  You have severe pain at the catheter insertion area.  The catheter insertion area swells very fast.  The catheter insertion area is bleeding, and the bleeding does not stop  when you hold steady pressure on the area.  The area near or just beyond the catheter insertion site becomes pale, cool, tingly, or numb. These symptoms may represent a serious problem that is an emergency. Do not wait to see if the symptoms will go away. Get medical help right away. Call your local emergency services (911 in the [place] Do not drive yourself to the hospital. Summary  After the procedure, it is common to have bruising and tenderness at the catheter insertion area.  After the procedure, it is important to rest and drink  plenty of fluids.  Do not take baths, swim, or use a hot tub until your health care provider says it is okay to do so. You may shower 24-48 hours after the procedure or as told by your health care provider.  If the catheter site starts bleeding, lie flat and put pressure on the site. If the bleeding does not stop, get help right away. This is a medical emergency. This information is not intended to replace advice given to you by your health care provider. Make sure you discuss any questions you have with your health care provider. Document Revised: 08/08/2017 Document Reviewed: 07/31/2016 Elsevier Patient Education  2020 [employer].

## 2019-11-26 NOTE — Interval H&P Note (Signed)
History and Physical Interval Note:  11/26/2019 11:59 AM  Garrett Hughes  has presented today for surgery, with the diagnosis of Aortic stenosis.  The various methods of treatment have been discussed with the patient and family. After consideration of risks, benefits and other options for treatment, the patient has consented to  Procedure(s): RIGHT/LEFT HEART CATH AND CORONARY ANGIOGRAPHY (N/A) as a surgical intervention.  The patient's history has been reviewed, patient examined, no change in status, stable for surgery.  I have reviewed the patient's chart and labs.  Questions were answered to the patient's satisfaction.     [name]

## 2019-12-08 ENCOUNTER — Ambulatory Visit (HOSPITAL_BASED_OUTPATIENT_CLINIC_OR_DEPARTMENT_OTHER)
Admit: 2019-12-08 | Discharge: 2019-12-08 | Disposition: A | Discharge status: Home / Self Care | Payer: Medicare Other | Attending: Physician Assistant | Admitting: Physician Assistant

## 2019-12-08 ENCOUNTER — Ambulatory Visit (HOSPITAL_COMMUNITY)
Admission: RE | Admit: 2019-12-08 | Discharge: 2019-12-08 | Disposition: A | Discharge status: Home / Self Care | Payer: Medicare Other | Source: Ambulatory Visit | Attending: Physician Assistant | Admitting: Physician Assistant

## 2019-12-08 ENCOUNTER — Ambulatory Visit (HOSPITAL_COMMUNITY): Payer: Medicare Other

## 2019-12-08 ENCOUNTER — Telehealth: Payer: Self-pay | Admitting: Cardiology

## 2019-12-08 ENCOUNTER — Other Ambulatory Visit: Payer: Self-pay

## 2019-12-08 DIAGNOSIS — Z01818 Encounter for other preprocedural examination: Secondary | ICD-10-CM | POA: Diagnosis not present

## 2019-12-08 DIAGNOSIS — I350 Nonrheumatic aortic (valve) stenosis: Secondary | ICD-10-CM | POA: Insufficient documentation

## 2019-12-08 DIAGNOSIS — I2510 Atherosclerotic heart disease of native coronary artery without angina pectoris: Secondary | ICD-10-CM | POA: Diagnosis not present

## 2019-12-08 DIAGNOSIS — I700 Atherosclerosis of aorta: Secondary | ICD-10-CM | POA: Diagnosis not present

## 2019-12-08 MED ORDER — IOHEXOL 350 MG/ML SOLN
100.0000 mL | Freq: Once | INTRAVENOUS | Status: AC | PRN
  Administered 2019-12-08: 12:00:00 100 mL via INTRAVENOUS

## 2019-12-08 NOTE — Telephone Encounter (Signed)
Thank you for letting me know. He is getting a CT angio of chest for pre TAVR right now.

## 2019-12-08 NOTE — Telephone Encounter (Signed)
Patient will need appointment with vascular surgery to further assess.  He would not be a candidate for carotid endarterectomy until valve is replaced. Olga Millers

## 2019-12-08 NOTE — Telephone Encounter (Signed)
Tammy Sours - [facility] vascular ultrasound Carotid artery duplex - right 80-99% stenosed; left 40-59% stenosed  Tammy Sours said patient would benefit from CT angio head/neck   Routed to order provider Carlean Jews PA and primary cardiologist Dr. Jens Som

## 2019-12-08 NOTE — Telephone Encounter (Signed)
Garrett Hughes is calling to give urgent results of the patient's recent procedures.

## 2019-12-08 NOTE — Telephone Encounter (Signed)
We will discuss him next week at our meeting. We can get him in with vascular either before or after TAVR based on our discussion

## 2019-12-08 NOTE — Progress Notes (Signed)
Carotid artery duplex has been completed. Preliminary results can be found in CV Proc through chart review.   12/08/19 9:42 AM Olen Cordial RVT

## 2019-12-08 NOTE — Telephone Encounter (Signed)
Spoke with pt, aware of carotid results and need to see a Careers adviser. He is aware a decision will be made about the timing of the valve and the carotid surgeries next week.

## 2019-12-09 ENCOUNTER — Encounter (HOSPITAL_COMMUNITY): Payer: Self-pay | Admitting: Dentistry

## 2019-12-09 ENCOUNTER — Ambulatory Visit (HOSPITAL_COMMUNITY): Payer: Self-pay | Admitting: Dentistry

## 2019-12-09 VITALS — BP 159/70 | HR 64 | Temp 98.3°F

## 2019-12-09 DIAGNOSIS — K036 Deposits [accretions] on teeth: Secondary | ICD-10-CM | POA: Diagnosis not present

## 2019-12-09 DIAGNOSIS — K0530 Chronic periodontitis, unspecified: Secondary | ICD-10-CM

## 2019-12-09 DIAGNOSIS — K031 Abrasion of teeth: Secondary | ICD-10-CM

## 2019-12-09 DIAGNOSIS — K06010 Localized gingival recession, unspecified: Secondary | ICD-10-CM

## 2019-12-09 DIAGNOSIS — Z01818 Encounter for other preprocedural examination: Secondary | ICD-10-CM

## 2019-12-09 DIAGNOSIS — M2630 Unspecified anomaly of tooth position of fully erupted tooth or teeth: Secondary | ICD-10-CM

## 2019-12-09 DIAGNOSIS — I350 Nonrheumatic aortic (valve) stenosis: Secondary | ICD-10-CM

## 2019-12-09 DIAGNOSIS — K030 Excessive attrition of teeth: Secondary | ICD-10-CM

## 2019-12-09 DIAGNOSIS — Z9189 Other specified personal risk factors, not elsewhere classified: Secondary | ICD-10-CM

## 2019-12-09 DIAGNOSIS — M264 Malocclusion, unspecified: Secondary | ICD-10-CM

## 2019-12-09 DIAGNOSIS — K08109 Complete loss of teeth, unspecified cause, unspecified class: Secondary | ICD-10-CM

## 2019-12-09 DIAGNOSIS — K08409 Partial loss of teeth, unspecified cause, unspecified class: Secondary | ICD-10-CM

## 2019-12-09 DIAGNOSIS — K029 Dental caries, unspecified: Secondary | ICD-10-CM | POA: Diagnosis not present

## 2019-12-09 DIAGNOSIS — Z972 Presence of dental prosthetic device (complete) (partial): Secondary | ICD-10-CM

## 2019-12-09 DIAGNOSIS — K0889 Other specified disorders of teeth and supporting structures: Secondary | ICD-10-CM

## 2019-12-09 NOTE — Patient Instructions (Signed)
Garrett Hughes    Department of Dental Medicine     DR. Tykia Mellone      HEART VALVES AND MOUTH CARE:  FACTS:   If you have any infection in your mouth, it can infect your heart valve.  If you heart valve is infected, you will be seriously ill.  Infections in the mouth can be SILENT and do not always cause pain.  Examples of infections in the mouth are gum disease, dental cavities, and abscesses.  Some possible signs of infection are: Bad breath, bleeding gums, or teeth that are sensitive to sweets, hot, and/or cold. There are many other signs as well.  WHAT YOU HAVE TO DO:   Brush your teeth after meals and at bedtime. Spend at least 2 minutes brushing well, especially behind your back teeth and all around your teeth that stand alone. Brush at the gumline also.  Do not go to bed without brushing your teeth and flossing.  If you gums bleed when you brush or floss, do NOT stop brushing or flossing. It usually means that your gums need more attention and better cleaning.   If your Dentist or Dr. Leroy Pettway gave you a prescription mouthwash to use, make sure to use it as directed. If you run out of the medication, get a refill at the pharmacy.   If you were given any other medications or directions by your Dentist, please follow them. If you did not understand the directions or forget what you were told, please call. We will be happy to refresh her memory.  If you need antibiotics before dental procedures, make sure you take them one hour prior to every dental visit as directed.   Get a dental checkup every 4-6 months in order to keep your mouth healthy, or to find and treat any new infection. You will most likely need your teeth cleaned or gums treated at the same time.  If you are not able to come in for your scheduled appointment, call your Dentist as soon as possible to reschedule.  If you have a problem in between dental visits, call your Dentist.  

## 2019-12-09 NOTE — Progress Notes (Signed)
Marland KitchenMarland KitchenMarland Kitchen..............................Marland Kitchen DENTAL CONSULTATION  Date of Consultation:  12/09/2019 Patient Name:   Garrett Hughes Date of Birth:   [birth year] Medical Record Number: [id number]  COVID 19 SCREENING: The patient does not symptoms concerning for COVID-19 infection (Including fever, chills, cough, or new SHORTNESS OF BREATH).    VITALS: BP (!) 159/70 (BP Location: Right Arm)   Pulse 64   Temp 98.3 F (36.8 C)   CHIEF COMPLAINT: Patient referred by Dr. Burt Knack for dental consultation  HPI: Garrett Hughes is a 79 year old male recently diagnosed with severe aortic stenosis.  Patient with anticipated aortic valve replacement surgery in the near future.  Patient is now seen as part of a medically necessary preheart valve surgery dental protocol examination to rule out dental infection that may affect the patient's systemic health and anticipated heart valve surgery.  The patient currently denies acute toothaches, swellings, or abscesses.  Patient was last seen by the dentist for insertion of an upper complete denture.  [facility] was [facility] [place].  Patient had a cleaning in July 2018.  Patient denies having any problems with his upper complete denture.  Patient denies having dental phobia.  PROBLEM LIST: Patient Active Problem List   Diagnosis Date Noted  . Nonrheumatic aortic valve stenosis 01/01/2017    Priority: High  . Hypocalcemia 03/26/2019  . Congestive heart failure ([place]) 03/26/2019  . Hyperlipidemia   . GERD (gastroesophageal reflux disease)   . Diabetes mellitus without complication ([place])   . Atrial fibrillation ([place])   . 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 Type 2 diabetes mellitus with kidney complication, without long-term current use of insulin ([place]) 07/03/2016  . Low back pain 05/02/2016  . Atherosclerosis of native coronary artery of native heart without angina pectoris 01/05/2016  . Benign essential hypertension 01/05/2016  . Diabetic kidney disease  ([place]) 01/05/2016  . A-fib ([place]) 12/20/2015  . Current use of long term anticoagulation 12/20/2015  . History of Barrett's esophagus 12/20/2015  . Hyperlipemia 10/26/2015  . Cataract, nuclear, right 05/06/2014    PMH: Past Medical History:  Diagnosis Date  . A-fib ([place]) 12/20/2015  . Aortic stenosis, mild 01/01/2017  . Atherosclerosis of native coronary artery of native heart without angina pectoris 01/05/2016  . Benign essential hypertension 01/05/2016  . Cataract, nuclear, right 05/06/2014  . Current use of long term anticoagulation 12/20/2015  . Diabetic kidney disease ([place]) 01/05/2016  . GERD (gastroesophageal reflux disease)   . History of Barrett's esophagus 12/20/2015  . 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 Hyperlipemia 10/26/2015  . Low back pain 05/02/2016  . Type 2 diabetes mellitus with kidney complication, without long-term current use of insulin ([place]) 07/03/2016    PSH: Past Surgical History:  Procedure Laterality Date  . BACK SURGERY  2004  . [place]  . RIGHT HEART CATH AND CORONARY/GRAFT ANGIOGRAPHY N/A 11/26/2019   Procedure: RIGHT HEART CATH AND CORONARY/GRAFT ANGIOGRAPHY;  Surgeon: Sherren Mocha, MD;  Location: [facility];  Service: Cardiovascular;  Laterality: N/A;  . SHOULDER SURGERY  2005    ALLERGIES: Allergies  Allergen Reactions  . Atorvastatin     Other reaction(s): Myalgias (intolerance)  . Cholestyramine Other (See Comments)    Constipation  . Pravastatin Other (See Comments)    Patient currently taking  ????12/09/19    MEDICATIONS: Current Outpatient Medications  Medication Sig Dispense Refill  . ACCU-CHEK  SOFTCLIX LANCETS lancets Use as instructed to check blood sugar up to qid. Dx Code E11.22. 100 each 12  . Alcohol Swabs PADS Use as instructed to check blood sugar up to qid. Dx code E11.22 100 each 1  . AMBULATORY NON FORMULARY MEDICATION Medication Name: accu-chek aviva plus meter,  accu-check aviva plus test strips & accu-chek softclix lancets to test twice a day. Dx: E11.29 1 vial PRN  . aspirin 81 MG chewable tablet Chew by mouth daily.    . Blood Glucose Calibration (ACCU-CHEK AVIVA) SOLN Use as instructed per manufacturer directions. Dx code E11.22 1 each 0  . Blood Glucose Monitoring Suppl (ACCU-CHEK AVIVA PLUS) w/Device KIT Use as instructed to check blood sugar up to qid. Dx code E11.22 1 kit 0  . clopidogrel (PLAVIX) 75 MG tablet TAKE 1 TABLET BY MOUTH  DAILY (Patient taking differently: Take 75 mg by mouth daily. ) 90 tablet 3  . doxazosin (CARDURA) 8 MG tablet TAKE 1 TABLET BY MOUTH  DAILY (Patient taking differently: Take 8 mg by mouth daily. ) 90 tablet 1  . ferrous sulfate 325 (65 FE) MG EC tablet Take 1 tablet (325 mg total) by mouth daily with breakfast. 90 tablet 3  . furosemide (LASIX) 40 MG tablet MAINTENANCE: TAKE 1 TABLET  BY MOUTH DAILY. IF  INCREASED SWELLING OR  SHORTNESS OF BREATH: 1 TAB  BY MOUTH TWICE DAILY (Patient taking differently: Take 40 mg by mouth daily. ) 180 tablet 3  . glipiZIDE (GLUCOTROL) 5 MG tablet TAKE 1 TABLET BY MOUTH  TWICE A DAY BEFORE MEALS (Patient taking differently: Take 5 mg by mouth 2 (two) times daily before a meal. ) 180 tablet 1  . glucose blood (ACCU-CHEK AVIVA PLUS) test strip Use as instructed to check blood sugar up to qid. Dx Code E11.22. Disp generic per pt preference / insurance coverage 100 each 99  . Lancets (ONETOUCH ULTRASOFT) lancets Use as instructed to check blood sugar up to qid. Dx: E11.22 100 each 99  . lisinopril (ZESTRIL) 5 MG tablet TAKE 1 TABLET BY MOUTH  DAILY (Patient taking differently: Take 5 mg by mouth daily. ) 90 tablet 3  . metFORMIN (GLUCOPHAGE) 1000 MG tablet TAKE 1 TABLET BY MOUTH  TWICE DAILY WITH A MEAL (Patient taking differently: Take 1,000 mg by mouth 2 (two) times daily with a meal. ) 180 tablet 1  . metoprolol succinate (TOPROL-XL) 25 MG 24 hr tablet TAKE 1 TABLET BY MOUTH  DAILY (Patient  taking differently: Take 25 mg by mouth daily. ) 90 tablet 1  . Multiple Vitamin (MULTI-VITAMIN DAILY PO) Take by mouth.    . pantoprazole (PROTONIX) 20 MG tablet TAKE 1 TABLET BY MOUTH  DAILY (Patient taking differently: Take 20 mg by mouth daily. ) 90 tablet 1  . pravastatin (PRAVACHOL) 40 MG tablet TAKE 1 TABLET BY MOUTH  DAILY (Patient taking differently: Take 40 mg by mouth daily. ) 90 tablet 1  . nitroGLYCERIN (NITROSTAT) 0.4 MG SL tablet Place 1 tablet (0.4 mg total) under the tongue every 5 (five) minutes as needed for chest pain. (Patient not taking: Reported on 12/09/2019) 30 tablet 1   No current facility-administered medications for this visit.    LABS: Lab Results  Component Value Date   WBC 7.0 11/23/2019   HGB 9.5 (L) 11/26/2019   HGB 9.9 (L) 11/26/2019   HCT 28.0 (L) 11/26/2019   HCT 29.0 (L) 11/26/2019   MCV 100.0 11/23/2019   PLT 141 11/23/2019  Component Value Date/Time   NA 142 11/26/2019 1244   NA 141 11/26/2019 1244   K 3.8 11/26/2019 1244   K 3.8 11/26/2019 1244   CL 98 11/23/2019 0811   CO2 25 11/23/2019 0811   GLUCOSE 237 (H) 11/23/2019 0811   BUN 27 (H) 11/23/2019 0811   CREATININE 1.25 (H) 11/23/2019 0811   CALCIUM 8.4 (L) 11/23/2019 0811   GFRNONAA 65 09/21/2019 0922   GFRAA 76 09/21/2019 0922   No results found for: INR, PROTIME No results found for: PTT  SOCIAL HISTORY: Social History   Socioeconomic History  . Marital status: Married    Spouse name: Edd Fabian  . Number of children: 2  . Years of education: 65  . Highest education level: 11th grade  Occupational History  . Occupation: truck Geophysicist/field seismologist    Comment: retired  Tobacco Use  . Smoking status: Former Smoker    Quit date: 09/10/1987    Years since quitting: 32.2  . Smokeless tobacco: Never Used  Substance and Sexual Activity  . Alcohol use: No  . Drug use: No  . Sexual activity: Not Currently  Other Topics Concern  . Not on file  Social History Narrative   Married, retired  Administrator   1 son one daughter   2 caffeinated beverages daily   3. Keeps grandsons and granddaughter   Social Determinants of Health   Financial Resource Strain:   . Difficulty of Paying Living Expenses:   Food Insecurity:   . Worried About Charity fundraiser in the Last Year:   . Arboriculturist in the Last Year:   Transportation Needs:   . Film/video editor (Medical):   Marland Kitchen Lack of Transportation (Non-Medical):   Physical Activity:   . Days of Exercise per Week:   . Minutes of Exercise per Session:   Stress:   . Feeling of Stress :   Social Connections:   . Frequency of Communication with Friends and Family:   . Frequency of Social Gatherings with Friends and Family:   . Attends Religious Services:   . Active Member of Clubs or Organizations:   . Attends Archivist Meetings:   Marland Kitchen Marital Status:   Intimate Partner Violence:   . Fear of Current or Ex-Partner:   . Emotionally Abused:   Marland Kitchen Physically Abused:   . Sexually Abused:     FAMILY HISTORY: Family History  Problem Relation Age of Onset  . CAD Father   . Colon cancer Neg Hx   . Heart attack Neg Hx     REVIEW OF SYSTEMS: Reviewed with the patient as per History of present illness. Psych: Patient denies having dental phobia.  DENTAL HISTORY: CHIEF COMPLAINT: Patient referred by Dr. Burt Knack for dental consultation  HPI: Garrett Hughes is a 79 year old male recently diagnosed with severe aortic stenosis.  Patient with anticipated aortic valve replacement surgery in the near future.  Patient is now seen as part of a medically necessary preheart valve surgery dental protocol examination to rule out dental infection that may affect the patient's systemic health and anticipated heart valve surgery.  The patient currently denies acute toothaches, swellings, or abscesses.  Patient was last seen by the dentist for insertion of an upper complete denture.  This was [facility] [place].  Patient had a cleaning in July 2018.  Patient denies having any problems with his upper complete denture.  Patient denies having dental  phobia.  DENTAL EXAMINATION: GENERAL: The patient is a well-developed, well-nourished male in no acute distress. HEAD AND NECK: There is no palpable neck lymphadenopathy.  The patient denies acute TMJ symptoms. INTRAORAL EXAM: The patient has normal saliva.  The patient has an edentulous maxilla.  There is atrophy of the edentulous alveolar ridges. DENTITION: Patient is missing tooth numbers 1 through 16, and 18.  Patient has a crowded lower anterior dentition with multiple malpositioned teeth.  There is evidence of mandibular anterior incisal attrition. PERIODONTAL: The patient has chronic periodontitis with plaque and calculus accumulations, gingival recession, and incipient to moderate bone loss.  There is mandibular anterior incipient tooth mobility. DENTAL CARIES/SUBOPTIMAL RESTORATIONS: Patient has dental caries on tooth numbers 20, 30, 31.  Patient also has multiple flexure lesions. ENDODONTIC: The patient currently denies acute pulpitis symptoms.  I do not see any evidence periapical pathology or radiolucency. CROWN AND BRIDGE: There are no chronic bridge restorations. PROSTHODONTIC: Patient has an upper complete denture that is stable and retentive.  This was fabricated in 2018.  This is the patient's second complete denture. OCCLUSION: Patient has a poor occlusal scheme but a stable occlusion.  RADIOGRAPHIC INTERPRETATION: Orthopantogram was taken and supplemented with 7 lower periapical radiographs. There are multiple missing teeth.  The patient has an edentulous maxilla.  There is atrophy of the edentulous alveolar ridges.  Multiple dental caries are noted.  There is evidence of mandibular anterior incisal attrition there is evidence of multiple malpositioned teeth.  There is moderate bone loss noted.   ASSESSMENTS: 1.  Severe aortic  stenosis 2.  Preheart valve surgery dental protocol 3.  Dental caries 4.  Chronic periodontitis of bone loss 5.  Gingival recession 6.  Accretions 7.  Mandibular anterior incipient tooth mobility 8.  Multiple missing teeth 9.  Atrophy of the edentulous alveolar ridges 10.  Stable and retentive upper complete denture 11.  Multiple malpositioned teeth 12.  Poor occlusal scheme and malocclusion 13.  Multiple flexure lesions 14.  Risk for bleeding with invasive dental procedures with current Plavix therapy 15.  Need for antibiotic premedication prior to invasive dental procedures after the anticipated heart valve surgery.   PLAN/RECOMMENDATIONS: 1. I discussed the risks, benefits, and complications of various treatment options with the patient in relationship to his medical and dental conditions, anticipated heart valve surgery, and risk for endocarditis.  We discussed various treatment options to include no treatment, periodontal therapy, dental restorations, root canal therapy, crown and bridge therapy, implant therapy, and replacement of missing teeth as indicated. The patient currently wishes to defer any dental treatment at this time.  Patient does agree to follow-up with a new primary dentist of his choice for comprehensive exam, dental restorations, and treatment as indicated after the he is medically stable from the anticipated heart valve surgery.  Patient understands he will need antibiotic premedication prior to invasive dental procedures after the heart valve surgery per American Heart Association guidelines.  The patient, therefore, is dentally cleared for heart valve surgery at the discretion of Dr. Burt Knack and his cardiothoracic surgeon.   2. Discussion of findings with medical team and coordination of future medical and dental care as needed.  I spent in excess of  120 minutes during the conduct of this consultation and >50% of this time involved direct face-to-face encounter for  counseling and/or coordination of the patient's care.    Lenn Cal, DDS

## 2019-12-14 ENCOUNTER — Telehealth: Payer: Self-pay

## 2019-12-14 ENCOUNTER — Telehealth: Payer: Self-pay | Admitting: Physician Assistant

## 2019-12-14 ENCOUNTER — Other Ambulatory Visit: Payer: Self-pay | Admitting: Physician Assistant

## 2019-12-14 DIAGNOSIS — I6523 Occlusion and stenosis of bilateral carotid arteries: Secondary | ICD-10-CM

## 2019-12-14 DIAGNOSIS — I2510 Atherosclerotic heart disease of native coronary artery without angina pectoris: Secondary | ICD-10-CM

## 2019-12-14 NOTE — Telephone Encounter (Signed)
  [facility]   MULTIDISCIPLINARY HEART VALVE TEAM  Discussed case in out multidisciplinary team meeting this AM. 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 We reviewed this CT scans which show bilateral common iliac disease which may prohibit TF access. There is a possibility Shockwave technology could be used to gain TF access. Dr. Excell Seltzer to review with industry. Otherwise, left subclavian in an option. He will also require atherectomy and stenting of his RCA, which will be set up for 4/16 with Dr. Excell Seltzer. Carotid dopplers revealed a high grade RICA stenosis (80-99%). I have reached out to [facility] to see if we can get him evaluated by a vascular surgeon in the near future.   I have reviewed all our findings with the patient and his wife. They are agreeable to come in for planned coronary intervention on 4/16. I offered an office visit with Dr. Excell Seltzer to go over everything in person, but they decline another office visit.    Cline Crock PA-C  [facility]

## 2019-12-14 NOTE — Telephone Encounter (Signed)
Scheduled patient for PCI/atherectomy of 4/16. He will have labs and COVID test 4/13. Instructions reviewed and letter released to MyChart.

## 2019-12-20 ENCOUNTER — Other Ambulatory Visit: Payer: Self-pay | Admitting: *Deleted

## 2019-12-20 ENCOUNTER — Ambulatory Visit (INDEPENDENT_AMBULATORY_CARE_PROVIDER_SITE_OTHER): Payer: Medicare Other | Admitting: Surgery

## 2019-12-20 ENCOUNTER — Other Ambulatory Visit: Payer: Self-pay

## 2019-12-20 ENCOUNTER — Encounter: Payer: Self-pay | Admitting: Surgery

## 2019-12-20 ENCOUNTER — Encounter (HOSPITAL_COMMUNITY): Payer: Self-pay

## 2019-12-20 ENCOUNTER — Ambulatory Visit (HOSPITAL_COMMUNITY)
Admission: RE | Admit: 2019-12-20 | Discharge: 2019-12-20 | Disposition: A | Discharge status: Home / Self Care | Payer: Medicare Other | Source: Ambulatory Visit | Attending: Surgery | Admitting: Surgery

## 2019-12-20 VITALS — BP 132/70 | HR 72 | Temp 98.1°F | Resp 20 | Ht 65.0 in | Wt 144.6 lb

## 2019-12-20 DIAGNOSIS — I6529 Occlusion and stenosis of unspecified carotid artery: Secondary | ICD-10-CM

## 2019-12-20 DIAGNOSIS — I6523 Occlusion and stenosis of bilateral carotid arteries: Secondary | ICD-10-CM

## 2019-12-20 NOTE — Progress Notes (Signed)
[facility]ist of [place]  Patient name: Garrett Hughes MRN: [id number] DOB: Aug 08, 1941 Sex: male   REQUESTING PROVIDER:    Angelena Form   REASON FOR CONSULT:    Carotid stenosis  HISTORY OF PRESENT ILLNESS:   Garrett Hughes is a 79 y.o. male, with severe aortic stenosis with plans for valve replacement in the near future.  During his preoperative work-up, he was found to have a high-grade right carotid stenosis, 80-99%.  He is asymptomatic.  Specifically, he denies numbness or weakness in either extremity.  He denies slurred speech.  He denies amaurosis fugax.  He is on dual antiplatelet therapy with aspirin and Plavix  The patient has a history of atrial fibrillation.  He is not on anticoagulation secondary to history of GI bleed as well as urinary tract bleeding.  The patient is a diabetic. He is medically managed for hypertension with an ACE inhibitor.  He takes a statin for hypercholesterolemia.   PAST MEDICAL HISTORY    Past Medical History:  Diagnosis Date  . A-fib ([place]) 12/20/2015  . Aortic stenosis, mild 01/01/2017  . Atherosclerosis of native coronary artery of native heart without angina pectoris 01/05/2016  . Benign essential hypertension 01/05/2016  . Cataract, nuclear, right 05/06/2014  . Current use of long term anticoagulation 12/20/2015  . Diabetic kidney disease ([place]) 01/05/2016  . GERD (gastroesophageal reflux disease)   . History of Barrett's esophagus 12/20/2015  . 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 Hyperlipemia 10/26/2015  . Low back pain 05/02/2016  . Type 2 diabetes mellitus with kidney complication, without long-term current use of insulin ([place]) 07/03/2016     FAMILY HISTORY   Family History  Problem Relation Age of Onset  . CAD Father   . Colon cancer Neg Hx   . Heart attack Neg Hx     SOCIAL HISTORY:   Social History   Socioeconomic History  .  Marital status: Married    Spouse name: Edd Fabian  . Number of children: 2  . Years of education: 62  . Highest education level: 11th grade  Occupational History  . Occupation: truck Geophysicist/field seismologist    Comment: retired  Tobacco Use  . Smoking status: Former Smoker    Quit date: 09/10/1987    Years since quitting: 32.2  . Smokeless tobacco: Never Used  Substance and Sexual Activity  . Alcohol use: No  . Drug use: No  . Sexual activity: Not Currently  Other Topics Concern  . 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 Keeps grandsons and granddaughter   Social Determinants of Health   Financial Resource Strain:   . Difficulty of Paying Living Expenses:   Food Insecurity:   . Worried About Charity fundraiser in the Last Year:   . Arboriculturist in the Last Year:   Transportation Needs:   . Film/video editor (Medical):   Marland Kitchen Lack of Transportation (Non-Medical):   Physical Activity:   . Days of Exercise per Week:   . Minutes of Exercise per Session:   Stress:   . Feeling of Stress :   Social Connections:   . Frequency of Communication with Friends and Family:   . Frequency of Social Gatherings with Friends and Family:   . Attends Religious Services:   .  Active Member of Clubs or Organizations:   . Attends Archivist Meetings:   Marland Kitchen Marital Status:   Intimate Partner Violence:   . Fear of Current or Ex-Partner:   . Emotionally Abused:   Marland Kitchen Physically Abused:   . Sexually Abused:     ALLERGIES:    Allergies  Allergen Reactions  . Atorvastatin     Other reaction(s): Myalgias (intolerance)  . Cholestyramine Other (See Comments)    Constipation  . Pravastatin Other (See Comments)    Patient currently taking  ????12/09/19    CURRENT MEDICATIONS:    Current Outpatient Medications  Medication Sig Dispense Refill  . ACCU-CHEK SOFTCLIX LANCETS lancets Use as instructed to check  blood sugar up to qid. Dx Code E11.22. 100 each 12  . Alcohol Swabs PADS Use as instructed to check blood sugar up to qid. Dx code E11.22 100 each 1  . AMBULATORY NON FORMULARY MEDICATION Medication Name: accu-chek aviva plus meter, accu-check aviva plus test strips & accu-chek softclix lancets to test twice a day. Dx: E11.29 1 vial PRN  . aspirin 81 MG chewable tablet Chew 81 mg by mouth daily.     . Blood Glucose Calibration (ACCU-CHEK AVIVA) SOLN Use as instructed per manufacturer directions. Dx code E11.22 1 each 0  . Blood Glucose Monitoring Suppl (ACCU-CHEK AVIVA PLUS) w/Device KIT Use as instructed to check blood sugar up to qid. Dx code E11.22 1 kit 0  . clopidogrel (PLAVIX) 75 MG tablet TAKE 1 TABLET BY MOUTH  DAILY (Patient taking differently: Take 75 mg by mouth daily. ) 90 tablet 3  . doxazosin (CARDURA) 8 MG tablet TAKE 1 TABLET BY MOUTH  DAILY (Patient taking differently: Take 8 mg by mouth daily. ) 90 tablet 1  . ferrous sulfate 325 (65 FE) MG EC tablet Take 1 tablet (325 mg total) by mouth daily with breakfast. 90 tablet 3  . furosemide (LASIX) 40 MG tablet MAINTENANCE: TAKE 1 TABLET  BY MOUTH DAILY. IF  INCREASED SWELLING OR  SHORTNESS OF BREATH: 1 TAB  BY MOUTH TWICE DAILY (Patient taking differently: Take 40 mg by mouth daily. ) 180 tablet 3  . glipiZIDE (GLUCOTROL) 5 MG tablet TAKE 1 TABLET BY MOUTH  TWICE A DAY BEFORE MEALS (Patient taking differently: Take 5 mg by mouth 2 (two) times daily before a meal. ) 180 tablet 1  . glucose blood (ACCU-CHEK AVIVA PLUS) test strip Use as instructed to check blood sugar up to qid. Dx Code E11.22. Disp generic per pt preference / insurance coverage 100 each 99  . Lancets (ONETOUCH ULTRASOFT) lancets Use as instructed to check blood sugar up to qid. Dx: E11.22 100 each 99  . lisinopril (ZESTRIL) 5 MG tablet TAKE 1 TABLET BY MOUTH  DAILY (Patient taking differently: Take 5 mg by mouth daily. ) 90 tablet 3  . metFORMIN (GLUCOPHAGE) 1000 MG tablet  TAKE 1 TABLET BY MOUTH  TWICE DAILY WITH A MEAL (Patient taking differently: Take 1,000 mg by mouth 2 (two) times daily with a meal. ) 180 tablet 1  . metoprolol succinate (TOPROL-XL) 25 MG 24 hr tablet TAKE 1 TABLET BY MOUTH  DAILY (Patient taking differently: Take 25 mg by mouth daily. ) 90 tablet 1  . Multiple Vitamin (MULTI-VITAMIN DAILY PO) Take 1 tablet by mouth daily.     . nitroGLYCERIN (NITROSTAT) 0.4 MG SL tablet Place 1 tablet (0.4 mg total) under the tongue every 5 (five) minutes as needed for chest pain. 30 tablet  1  . pantoprazole (PROTONIX) 20 MG tablet TAKE 1 TABLET BY MOUTH  DAILY (Patient taking differently: Take 20 mg by mouth daily. ) 90 tablet 1  . pravastatin (PRAVACHOL) 40 MG tablet TAKE 1 TABLET BY MOUTH  DAILY (Patient taking differently: Take 40 mg by mouth daily. ) 90 tablet 1   No current facility-administered medications for this visit.    REVIEW OF SYSTEMS:   '[X]'  denotes positive finding, '[ ]'  denotes negative finding Cardiac  Comments:  Chest pain or chest pressure:    Shortness of breath upon exertion: x   Short of breath when lying flat:    Irregular heart rhythm: x       Vascular    Pain in calf, thigh, or hip brought on by ambulation:    Pain in feet at night that wakes you up from your sleep:     Blood clot in your veins:    Leg swelling:  x       Pulmonary    Oxygen at home:    Productive cough:     Wheezing:         Neurologic    Sudden weakness in arms or legs:     Sudden numbness in arms or legs:     Sudden onset of difficulty speaking or slurred speech:    Temporary loss of vision in one eye:     Problems with dizziness:         Gastrointestinal    Blood in stool:      Vomited blood:         Genitourinary    Burning when urinating:     Blood in urine:        Psychiatric    Major depression:         Hematologic    Bleeding problems:    Problems with blood clotting too easily:        Skin    Rashes or ulcers:          Constitutional    Fever or chills:     PHYSICAL EXAM:   Vitals:   12/20/19 0829  BP: 127/67  Pulse: 72  Resp: 20  Temp: 98.1 F (36.7 C)  SpO2: 93%  Weight: 144 lb 9.6 oz (65.6 kg)  Height: '5\' 5"'  (1.651 m)    GENERAL: The patient is a well-nourished male, in no acute distress. The vital signs are documented above. CARDIAC: There is a regular rate and rhythm.  VASCULAR: Palpable pedal pulses PULMONARY: Nonlabored respirations ABDOMEN: Soft and non-tender MUSCULOSKELETAL: There are no major deformities or cyanosis. NEUROLOGIC: No focal weakness or paresthesias are detected. SKIN: There are no ulcers or rashes noted. PSYCHIATRIC: The patient has a normal affect.  STUDIES:   I have reviewed his carotid duplex with the following findings: Right Carotid: Velocities in the right ICA are consistent with a 80-99%         stenosis.   Left Carotid: Velocities in the left ICA are consistent with a 40-59%  stenosis.   Vertebrals: Bilateral vertebral arteries demonstrate antegrade flow.   The patient does have a high carotid bifurcation. ASSESSMENT and PLAN   Asymptomatic right carotid stenosis: By ultrasound, his carotid bifurcation was found to be high.  I think he needs to undergo a CT angiogram of the neck to determine the extent of his disease, the degree of stenosis, and to see if he is a candidate for TCAR.  Dr. Stanford Breed has stated that  his aortic stenosis needs to be addressed prior to his carotid.  He is scheduled for TAVR in the immediate future.  I will get a CT angiogram in approximately 2 to 3 weeks and have him follow-up afterwards for surgical consideration.  This will be a low-dose scan given his history of diabetic nephropathy.  His most recent creatinine was around 1.25.   Leia Alf, MD, FACS [facility] Specialists of [facility] [phone] Pager [phone]

## 2019-12-21 ENCOUNTER — Other Ambulatory Visit (HOSPITAL_COMMUNITY)
Admission: RE | Admit: 2019-12-21 | Discharge: 2019-12-21 | Disposition: A | Discharge status: Home / Self Care | Payer: Medicare Other | Source: Ambulatory Visit | Attending: Cardiovascular Disease | Admitting: Cardiovascular Disease

## 2019-12-21 ENCOUNTER — Other Ambulatory Visit: Payer: Self-pay

## 2019-12-21 ENCOUNTER — Other Ambulatory Visit (HOSPITAL_COMMUNITY): Payer: Medicare Other

## 2019-12-21 ENCOUNTER — Other Ambulatory Visit: Payer: Medicare Other

## 2019-12-21 DIAGNOSIS — Z01812 Encounter for preprocedural laboratory examination: Secondary | ICD-10-CM | POA: Diagnosis not present

## 2019-12-21 DIAGNOSIS — Z20822 Contact with and (suspected) exposure to covid-19: Secondary | ICD-10-CM | POA: Diagnosis not present

## 2019-12-21 DIAGNOSIS — I2510 Atherosclerotic heart disease of native coronary artery without angina pectoris: Secondary | ICD-10-CM

## 2019-12-21 DIAGNOSIS — I6529 Occlusion and stenosis of unspecified carotid artery: Secondary | ICD-10-CM

## 2019-12-21 LAB — CBC WITH DIFFERENTIAL/PLATELET
Basophils Absolute: 0 10*3/uL (ref 0.0–0.2)
Basos: 0 %
EOS (ABSOLUTE): 0 10*3/uL (ref 0.0–0.4)
Eos: 0 %
Hematocrit: 33 % — ABNORMAL LOW (ref 37.5–51.0)
Hemoglobin: 10.9 g/dL — ABNORMAL LOW (ref 13.0–17.7)
Lymphocytes Absolute: 0.8 10*3/uL (ref 0.7–3.1)
Lymphs: 10 %
MCH: 32.5 pg (ref 26.6–33.0)
MCHC: 33 g/dL (ref 31.5–35.7)
MCV: 99 fL — ABNORMAL HIGH (ref 79–97)
Monocytes Absolute: 0.7 10*3/uL (ref 0.1–0.9)
Monocytes: 9 %
Neutrophils Absolute: 6.7 10*3/uL (ref 1.4–7.0)
Neutrophils: 81 %
Platelets: 144 10*3/uL — ABNORMAL LOW (ref 150–450)
RBC: 3.35 x10E6/uL — ABNORMAL LOW (ref 4.14–5.80)
RDW: 14.1 % (ref 11.6–15.4)
WBC: 8.3 10*3/uL (ref 3.4–10.8)

## 2019-12-21 LAB — BASIC METABOLIC PANEL
BUN/Creatinine Ratio: 25 — ABNORMAL HIGH (ref 10–24)
BUN: 24 mg/dL (ref 8–27)
CO2: 26 mmol/L (ref 20–29)
Calcium: 9.1 mg/dL (ref 8.6–10.2)
Chloride: 100 mmol/L (ref 96–106)
Creatinine, Ser: 0.96 mg/dL (ref 0.76–1.27)
GFR calc Af Amer: 87 mL/min/{1.73_m2} (ref >59)
GFR calc non Af Amer: 75 mL/min/{1.73_m2} (ref >59)
Glucose: 159 mg/dL — ABNORMAL HIGH (ref 65–99)
Potassium: 4.1 mmol/L (ref 3.5–5.2)
Sodium: 136 mmol/L (ref 134–144)

## 2019-12-21 LAB — SARS CORONAVIRUS 2 (TAT 6-24 HRS): SARS Coronavirus 2: NEGATIVE

## 2019-12-22 ENCOUNTER — Other Ambulatory Visit: Payer: Self-pay | Admitting: Surgery

## 2019-12-22 DIAGNOSIS — I6529 Occlusion and stenosis of unspecified carotid artery: Secondary | ICD-10-CM

## 2019-12-23 ENCOUNTER — Telehealth: Payer: Self-pay | Admitting: *Deleted

## 2019-12-23 NOTE — Telephone Encounter (Signed)
Pt contacted pre-catheterization scheduled at [facility] for: Friday December 24, 2019 10:30 AM Verified arrival time and place: [facility] Main Entrance [facility]) at: 8:30 AM   No solid food after midnight prior to cath, clear liquids until 5 AM day of procedure.  Hold: Metformin-day of procedure and 48 hours post procedure. Gipizide-AM of procedure. Lasix- AM of procedure  Except hold medications AM meds can be  taken pre-cath with sip of water including: ASA 81 mg Plavix 75mg   Confirmed patient has responsible adult to drive home post procedure and observe 24 hours after arriving home: yes  Currently, due to Covid-19 pandemic, only one person will be allowed with patient. Must be the same person for patient's entire stay and will be required to wear a mask. They will be asked to wait in the waiting room for the duration of the patient's stay.  Patients are required to wear a mask when they enter the hospital.      COVID-19 Pre-Screening Questions:  . In the past 7 to 10 days have you had a cough,  shortness of breath, headache, congestion, fever (100 or greater) body aches, chills, sore throat, or sudden loss of taste or sense of smell? Shortness of breath, not new . Have you been around anyone with known Covid 19 in the past 7 to 10 days? no . Have you been around anyone who is awaiting Covid 19 test results in the past 7 to 10 days? no . Have you been around anyone who  has mentioned symptoms of Covid 19 within the past 7 to 10 days? no  Reviewed procedure/mask/visitor instructions, COVID-19 screening questions with patient.

## 2019-12-24 ENCOUNTER — Observation Stay (HOSPITAL_COMMUNITY)
Admission: RE | Admit: 2019-12-24 | Discharge: 2019-12-26 | Disposition: A | Discharge status: Home / Self Care | Payer: Medicare Other | Attending: Cardiology | Admitting: Cardiology

## 2019-12-24 ENCOUNTER — Encounter (HOSPITAL_COMMUNITY)
Admission: RE | Disposition: A | Discharge status: Home / Self Care | Payer: Self-pay | Source: Home / Self Care | Attending: Cardiovascular Disease

## 2019-12-24 ENCOUNTER — Other Ambulatory Visit: Payer: Self-pay

## 2019-12-24 ENCOUNTER — Encounter (HOSPITAL_COMMUNITY): Payer: Self-pay | Admitting: Cardiovascular Disease

## 2019-12-24 ENCOUNTER — Ambulatory Visit (HOSPITAL_BASED_OUTPATIENT_CLINIC_OR_DEPARTMENT_OTHER): Payer: Medicare Other

## 2019-12-24 DIAGNOSIS — I10 Essential (primary) hypertension: Secondary | ICD-10-CM | POA: Diagnosis not present

## 2019-12-24 DIAGNOSIS — I25119 Atherosclerotic heart disease of native coronary artery with unspecified angina pectoris: Secondary | ICD-10-CM | POA: Diagnosis not present

## 2019-12-24 DIAGNOSIS — E785 Hyperlipidemia, unspecified: Secondary | ICD-10-CM | POA: Diagnosis present

## 2019-12-24 DIAGNOSIS — Z955 Presence of coronary angioplasty implant and graft: Secondary | ICD-10-CM

## 2019-12-24 DIAGNOSIS — I482 Chronic atrial fibrillation, unspecified: Secondary | ICD-10-CM | POA: Diagnosis not present

## 2019-12-24 DIAGNOSIS — I4891 Unspecified atrial fibrillation: Secondary | ICD-10-CM | POA: Diagnosis present

## 2019-12-24 DIAGNOSIS — R0609 Other forms of dyspnea: Secondary | ICD-10-CM

## 2019-12-24 DIAGNOSIS — Z951 Presence of aortocoronary bypass graft: Secondary | ICD-10-CM | POA: Insufficient documentation

## 2019-12-24 DIAGNOSIS — Z7982 Long term (current) use of aspirin: Secondary | ICD-10-CM | POA: Diagnosis not present

## 2019-12-24 DIAGNOSIS — E1129 Type 2 diabetes mellitus with other diabetic kidney complication: Secondary | ICD-10-CM | POA: Diagnosis present

## 2019-12-24 DIAGNOSIS — I083 Combined rheumatic disorders of mitral, aortic and tricuspid valves: Secondary | ICD-10-CM | POA: Diagnosis not present

## 2019-12-24 DIAGNOSIS — Z87891 Personal history of nicotine dependence: Secondary | ICD-10-CM | POA: Insufficient documentation

## 2019-12-24 DIAGNOSIS — Z79899 Other long term (current) drug therapy: Secondary | ICD-10-CM | POA: Diagnosis not present

## 2019-12-24 DIAGNOSIS — Z7902 Long term (current) use of antithrombotics/antiplatelets: Secondary | ICD-10-CM | POA: Diagnosis not present

## 2019-12-24 DIAGNOSIS — E119 Type 2 diabetes mellitus without complications: Secondary | ICD-10-CM | POA: Insufficient documentation

## 2019-12-24 DIAGNOSIS — I313 Pericardial effusion (noninflammatory): Secondary | ICD-10-CM

## 2019-12-24 DIAGNOSIS — K219 Gastro-esophageal reflux disease without esophagitis: Secondary | ICD-10-CM | POA: Diagnosis not present

## 2019-12-24 DIAGNOSIS — I350 Nonrheumatic aortic (valve) stenosis: Secondary | ICD-10-CM

## 2019-12-24 HISTORY — DX: Nonrheumatic aortic (valve) stenosis: I35.0

## 2019-12-24 HISTORY — DX: Personal history of other diseases of urinary system: Z87.448

## 2019-12-24 HISTORY — DX: Disorder of arteries and arterioles, unspecified: I77.9

## 2019-12-24 HISTORY — DX: Thrombocytopenia, unspecified: D69.6

## 2019-12-24 HISTORY — DX: Gastrointestinal hemorrhage, unspecified: K92.2

## 2019-12-24 HISTORY — DX: Anemia, unspecified: D64.9

## 2019-12-24 HISTORY — PX: CORONARY ATHERECTOMY: CATH118238

## 2019-12-24 LAB — POCT ACTIVATED CLOTTING TIME
Activated Clotting Time: 219 seconds
Activated Clotting Time: 230 seconds
Activated Clotting Time: 268 seconds
Activated Clotting Time: 268 seconds

## 2019-12-24 LAB — ECHOCARDIOGRAM LIMITED
Height: 65 in
Weight: 2272 oz

## 2019-12-24 LAB — GLUCOSE, CAPILLARY
Glucose-Capillary: 160 mg/dL — ABNORMAL HIGH (ref 70–99)
Glucose-Capillary: 178 mg/dL — ABNORMAL HIGH (ref 70–99)
Glucose-Capillary: 252 mg/dL — ABNORMAL HIGH (ref 70–99)
Glucose-Capillary: 144 mg/dL — ABNORMAL HIGH (ref 70–99)

## 2019-12-24 LAB — CK TOTAL AND CKMB (NOT AT ARMC)
CK, MB: 20.3 ng/mL — ABNORMAL HIGH (ref 0.5–5.0)
Relative Index: 13.2 — ABNORMAL HIGH (ref 0.0–2.5)
Total CK: 154 U/L (ref 49–397)

## 2019-12-24 LAB — HEMOGLOBIN A1C
Hgb A1c MFr Bld: 6.9 % — ABNORMAL HIGH (ref 4.8–5.6)
Mean Plasma Glucose: 151.33 mg/dL

## 2019-12-24 LAB — MRSA PCR SCREENING: MRSA by PCR: NEGATIVE

## 2019-12-24 SURGERY — CORONARY ATHERECTOMY
Anesthesia: LOCAL

## 2019-12-24 MED ORDER — SODIUM CHLORIDE 0.9 % WEIGHT BASED INFUSION
1.0000 mL/kg/h | INTRAVENOUS | Status: AC
  Administered 2019-12-24: 17:00:00 1 mL/kg/h via INTRAVENOUS

## 2019-12-24 MED ORDER — HEPARIN SODIUM (PORCINE) 1000 UNIT/ML IJ SOLN
INTRAMUSCULAR | Status: AC
  Filled 2019-12-24: qty 1

## 2019-12-24 MED ORDER — DOXAZOSIN MESYLATE 8 MG PO TABS
8.0000 mg | ORAL_TABLET | Freq: Every day | ORAL | Status: DC
  Administered 2019-12-25 – 2019-12-26 (×2): 8 mg via ORAL
  Filled 2019-12-24 (×3): qty 1

## 2019-12-24 MED ORDER — NITROGLYCERIN 1 MG/10 ML FOR IR/CATH LAB
INTRA_ARTERIAL | Status: AC
  Filled 2019-12-24: qty 10

## 2019-12-24 MED ORDER — PANTOPRAZOLE SODIUM 20 MG PO TBEC
20.0000 mg | DELAYED_RELEASE_TABLET | Freq: Every day | ORAL | Status: DC
  Administered 2019-12-24 – 2019-12-26 (×3): 20 mg via ORAL
  Filled 2019-12-24 (×3): qty 1

## 2019-12-24 MED ORDER — SODIUM CHLORIDE 0.9 % IV SOLN: 250.0000 mL | INTRAVENOUS | Status: DC | PRN

## 2019-12-24 MED ORDER — HYDRALAZINE HCL 20 MG/ML IJ SOLN: 10.0000 mg | INTRAMUSCULAR | Status: AC | PRN

## 2019-12-24 MED ORDER — HEPARIN (PORCINE) IN NACL 1000-0.9 UT/500ML-% IV SOLN
INTRAVENOUS | Status: DC | PRN
  Administered 2019-12-24 (×2): 500 mL

## 2019-12-24 MED ORDER — METOPROLOL SUCCINATE ER 25 MG PO TB24
25.0000 mg | ORAL_TABLET | Freq: Every day | ORAL | Status: DC
  Administered 2019-12-24 – 2019-12-26 (×3): 25 mg via ORAL
  Filled 2019-12-24 (×3): qty 1

## 2019-12-24 MED ORDER — INSULIN ASPART 100 UNIT/ML ~~LOC~~ SOLN
0.0000 [IU] | Freq: Three times a day (TID) | SUBCUTANEOUS | Status: DC
  Administered 2019-12-25: 12:00:00 3 [IU] via SUBCUTANEOUS
  Administered 2019-12-25: 18:00:00 2 [IU] via SUBCUTANEOUS
  Administered 2019-12-25: 3 [IU] via SUBCUTANEOUS
  Administered 2019-12-26: 8 [IU] via SUBCUTANEOUS

## 2019-12-24 MED ORDER — HEPARIN SODIUM (PORCINE) 1000 UNIT/ML IJ SOLN
INTRAMUSCULAR | Status: DC | PRN
  Administered 2019-12-24: 3000 [IU] via INTRAVENOUS
  Administered 2019-12-24: 6000 [IU] via INTRAVENOUS
  Administered 2019-12-24: 3000 [IU] via INTRAVENOUS

## 2019-12-24 MED ORDER — SODIUM CHLORIDE 0.9 % WEIGHT BASED INFUSION
3.0000 mL/kg/h | INTRAVENOUS | Status: DC
  Administered 2019-12-24: 09:00:00 3 mL/kg/h via INTRAVENOUS

## 2019-12-24 MED ORDER — VERAPAMIL HCL 2.5 MG/ML IV SOLN
INTRAVENOUS | Status: AC
  Filled 2019-12-24: qty 2

## 2019-12-24 MED ORDER — MIDAZOLAM HCL 2 MG/2ML IJ SOLN
INTRAMUSCULAR | Status: AC
  Filled 2019-12-24: qty 2

## 2019-12-24 MED ORDER — LABETALOL HCL 5 MG/ML IV SOLN: 10.0000 mg | INTRAVENOUS | Status: AC | PRN

## 2019-12-24 MED ORDER — PRAVASTATIN SODIUM 40 MG PO TABS
40.0000 mg | ORAL_TABLET | Freq: Every day | ORAL | Status: DC
  Administered 2019-12-25 – 2019-12-26 (×2): 40 mg via ORAL
  Filled 2019-12-24 (×2): qty 1

## 2019-12-24 MED ORDER — ONDANSETRON HCL 4 MG/2ML IJ SOLN: 4.0000 mg | Freq: Four times a day (QID) | INTRAMUSCULAR | Status: DC | PRN

## 2019-12-24 MED ORDER — ACETAMINOPHEN 325 MG PO TABS
650.0000 mg | ORAL_TABLET | ORAL | Status: DC | PRN
  Administered 2019-12-24: 650 mg via ORAL
  Filled 2019-12-24 (×2): qty 2

## 2019-12-24 MED ORDER — MIDAZOLAM HCL 2 MG/2ML IJ SOLN
INTRAMUSCULAR | Status: DC | PRN
  Administered 2019-12-24 (×4): 1 mg via INTRAVENOUS

## 2019-12-24 MED ORDER — LIDOCAINE HCL (PF) 1 % IJ SOLN
INTRAMUSCULAR | Status: AC
  Filled 2019-12-24: qty 30

## 2019-12-24 MED ORDER — MORPHINE SULFATE (PF) 2 MG/ML IV SOLN
INTRAVENOUS | Status: AC
  Filled 2019-12-24: qty 1

## 2019-12-24 MED ORDER — NITROGLYCERIN 1 MG/10 ML FOR IR/CATH LAB
INTRA_ARTERIAL | Status: DC | PRN
  Administered 2019-12-24: 150 ug via INTRACORONARY
  Administered 2019-12-24: 200 ug via INTRACORONARY
  Administered 2019-12-24: 150 ug via INTRACORONARY

## 2019-12-24 MED ORDER — VERAPAMIL HCL 2.5 MG/ML IV SOLN
INTRAVENOUS | Status: DC | PRN
  Administered 2019-12-24: 10 mL via INTRA_ARTERIAL

## 2019-12-24 MED ORDER — HEPARIN (PORCINE) IN NACL 1000-0.9 UT/500ML-% IV SOLN
INTRAVENOUS | Status: AC
  Filled 2019-12-24: qty 1000

## 2019-12-24 MED ORDER — HEPARIN (PORCINE) IN NACL 1000-0.9 UT/500ML-% IV SOLN
INTRAVENOUS | Status: DC | PRN
  Administered 2019-12-24: 500 mL

## 2019-12-24 MED ORDER — FENTANYL CITRATE (PF) 100 MCG/2ML IJ SOLN
INTRAMUSCULAR | Status: DC | PRN
  Administered 2019-12-24 (×4): 25 ug via INTRAVENOUS

## 2019-12-24 MED ORDER — NITROGLYCERIN IN D5W 200-5 MCG/ML-% IV SOLN
INTRAVENOUS | Status: AC
  Filled 2019-12-24: qty 250

## 2019-12-24 MED ORDER — SODIUM CHLORIDE 0.9 % IV BOLUS
1000.0000 mL | Freq: Once | INTRAVENOUS | Status: AC
  Administered 2019-12-24: 23:00:00 1000 mL via INTRAVENOUS

## 2019-12-24 MED ORDER — VIPERSLIDE LUBRICANT OPTIME: TOPICAL | Status: DC | PRN

## 2019-12-24 MED ORDER — ASPIRIN 81 MG PO CHEW
81.0000 mg | CHEWABLE_TABLET | Freq: Every day | ORAL | Status: DC
  Administered 2019-12-25 – 2019-12-26 (×2): 81 mg via ORAL
  Filled 2019-12-24 (×2): qty 1

## 2019-12-24 MED ORDER — ASPIRIN 81 MG PO CHEW: 81.0000 mg | CHEWABLE_TABLET | ORAL | Status: DC

## 2019-12-24 MED ORDER — NITROGLYCERIN IN D5W 200-5 MCG/ML-% IV SOLN
5.0000 ug/min | INTRAVENOUS | Status: DC
  Administered 2019-12-24: 10 ug/min via INTRAVENOUS

## 2019-12-24 MED ORDER — NITROGLYCERIN 0.4 MG SL SUBL: 0.4000 mg | SUBLINGUAL_TABLET | SUBLINGUAL | Status: DC | PRN

## 2019-12-24 MED ORDER — MORPHINE SULFATE (PF) 2 MG/ML IV SOLN
2.0000 mg | INTRAVENOUS | Status: DC | PRN
  Administered 2019-12-24 (×2): 2 mg via INTRAVENOUS
  Filled 2019-12-24 (×2): qty 1

## 2019-12-24 MED ORDER — OXYCODONE HCL 5 MG PO TABS: 5.0000 mg | ORAL_TABLET | ORAL | Status: DC | PRN

## 2019-12-24 MED ORDER — SODIUM CHLORIDE 0.9% FLUSH: 3.0000 mL | INTRAVENOUS | Status: DC | PRN

## 2019-12-24 MED ORDER — LIDOCAINE HCL (PF) 1 % IJ SOLN
INTRAMUSCULAR | Status: DC | PRN
  Administered 2019-12-24: 2 mL

## 2019-12-24 MED ORDER — IOHEXOL 350 MG/ML SOLN
INTRAVENOUS | Status: DC | PRN
  Administered 2019-12-24: 150 mL via INTRACARDIAC

## 2019-12-24 MED ORDER — SODIUM CHLORIDE 0.9% FLUSH
3.0000 mL | Freq: Two times a day (BID) | INTRAVENOUS | Status: DC
  Administered 2019-12-25 (×2): 3 mL via INTRAVENOUS

## 2019-12-24 MED ORDER — CLOPIDOGREL BISULFATE 75 MG PO TABS: 75.0000 mg | ORAL_TABLET | ORAL | Status: DC

## 2019-12-24 MED ORDER — FERROUS SULFATE 325 (65 FE) MG PO TABS
325.0000 mg | ORAL_TABLET | Freq: Every day | ORAL | Status: DC
  Administered 2019-12-25 – 2019-12-26 (×2): 325 mg via ORAL
  Filled 2019-12-24 (×3): qty 1

## 2019-12-24 MED ORDER — SODIUM CHLORIDE 0.9 % IV SOLN
Freq: Once | INTRAVENOUS | Status: AC
  Filled 2019-12-24: qty 50

## 2019-12-24 MED ORDER — CLOPIDOGREL BISULFATE 75 MG PO TABS
75.0000 mg | ORAL_TABLET | Freq: Every day | ORAL | Status: DC
  Administered 2019-12-25 – 2019-12-26 (×2): 75 mg via ORAL
  Filled 2019-12-24 (×2): qty 1

## 2019-12-24 MED ORDER — SODIUM CHLORIDE 0.9 % WEIGHT BASED INFUSION
1.0000 mL/kg/h | INTRAVENOUS | Status: DC
  Administered 2019-12-24: 1 mL/kg/h via INTRAVENOUS

## 2019-12-24 MED ORDER — SODIUM CHLORIDE 0.9 % WEIGHT BASED INFUSION: 1.0000 mL/kg/h | INTRAVENOUS | Status: DC

## 2019-12-24 MED ORDER — SODIUM CHLORIDE 0.9% FLUSH
3.0000 mL | Freq: Two times a day (BID) | INTRAVENOUS | Status: DC
  Administered 2019-12-24 – 2019-12-25 (×3): 3 mL via INTRAVENOUS

## 2019-12-24 MED ORDER — IOHEXOL 350 MG/ML SOLN
INTRAVENOUS | Status: AC
  Filled 2019-12-24: qty 1

## 2019-12-24 MED ORDER — FENTANYL CITRATE (PF) 100 MCG/2ML IJ SOLN
INTRAMUSCULAR | Status: AC
  Filled 2019-12-24: qty 2

## 2019-12-24 MED ORDER — CHLORHEXIDINE GLUCONATE CLOTH 2 % EX PADS
6.0000 | MEDICATED_PAD | Freq: Every day | CUTANEOUS | Status: DC
  Administered 2019-12-25: 6 via TOPICAL

## 2019-12-24 SURGICAL SUPPLY — 29 items
BALLN  ~~LOC~~ SAPPHIRE 4.5X18 (BALLOONS) ×2
BALLN SAPPHIRE 3.0X12 (BALLOONS) ×2
BALLN SAPPHIRE ~~LOC~~ 3.75X12 (BALLOONS) ×1 IMPLANT
BALLN ~~LOC~~ SAPPHIRE 4.5X18 (BALLOONS) ×1
BALLOON SAPPHIRE 3.0X12 (BALLOONS) IMPLANT
BALLOON ~~LOC~~ SAPPHIRE 4.5X18 (BALLOONS) IMPLANT
CATH LAUNCHER 6FR AL.75 (CATHETERS) ×1 IMPLANT
CATH LAUNCHER 6FR AL1 (CATHETERS) IMPLANT
CATH VISTA GUIDE 6FR JR4 (CATHETERS) ×1 IMPLANT
CATHETER LAUNCHER 6FR AL1 (CATHETERS) ×2
CROWN DIAMONDBACK CLASSIC 1.25 (BURR) ×1 IMPLANT
DEVICE RAD COMP TR BAND LRG (VASCULAR PRODUCTS) ×1 IMPLANT
ELECT DEFIB PAD ADLT CADENCE (PAD) ×1 IMPLANT
GLIDESHEATH SLEND SS 6F .021 (SHEATH) ×1 IMPLANT
GUIDEWIRE INQWIRE 1.5J.035X260 (WIRE) IMPLANT
INQWIRE 1.5J .035X260CM (WIRE) ×2
KIT ENCORE 26 ADVANTAGE (KITS) ×1 IMPLANT
KIT HEART LEFT (KITS) ×2 IMPLANT
KIT HEMO VALVE WATCHDOG (MISCELLANEOUS) ×1 IMPLANT
LUBRICANT VIPERSLIDE CORONARY (MISCELLANEOUS) ×1 IMPLANT
PACK CARDIAC CATHETERIZATION (CUSTOM PROCEDURE TRAY) ×2 IMPLANT
STENT PK PAPYRUS 4.0X20 (Permanent Stent) ×1 IMPLANT
STENT RESOLUTE ONYX 3.5X12 (Permanent Stent) ×1 IMPLANT
STENT RESOLUTE ONYX 3.5X18 (Permanent Stent) ×1 IMPLANT
STENT RESOLUTE ONYX 3.5X38 (Permanent Stent) ×1 IMPLANT
TRANSDUCER W/STOPCOCK (MISCELLANEOUS) ×2 IMPLANT
TUBING CIL FLEX 10 FLL-RA (TUBING) ×2 IMPLANT
WIRE COUGAR XT STRL 190CM (WIRE) ×1 IMPLANT
WIRE VIPERWIRE COR FLEX .012 (WIRE) ×1 IMPLANT

## 2019-12-24 NOTE — Progress Notes (Addendum)
MD Carnicelli at bedside to assess pt after drop in BP 77/43(55). Liter bolus of saline ordered and administered. MD did bedside ultrasound and found no concern of tamponade, though does have signs of JVD and dilated IVC. MD ordered liter bolus to be changed to 1/2 liter bolus. When MD left bedside, BP was 100/68(80).   Pt having increased ectopy as the night progressed with 5-6 beats VT. Paged cardiology again- lab orders were placed by MD.

## 2019-12-24 NOTE — Significant Event (Signed)
Cardiology Moonlighter Note  Paged by care nurse regarding hypotension. Patient's BP has been checked q15 min since cath today. This evening had BP reading of 77/56. Per chart review, patient underwent orbital atherectomy today with coronary perforation treated with covered stent placement.   I evaluated the patient at the bedside. He was laying flat, comfortably, conversant. His breathing was normal. He had no complaints, notably denied chest pain, shortness of breath. His JVP was mildly elevated to around 8cm H2O but without Kussmaul's sign. I performed a bedside ultrasound. The patient's cardiac windows were excellent. He had no evidence of pericardial fluid in precordial, apical, or substernal windows. His IVC was moderately dilated but with normal respirophasic variation. His RV revealed normal function without evidence of diastolic collapse.   Patient was given 500cc of normal saline and his blood pressure improved to 100/68.  He remained asymptomatic throughout the exam.   Will continue to monitor the patient closely. If he develops symptoms of shortness of breath, symptomatic hypotension, or worsening hypotension, will plan to call in echo tech for formal tamponade assessment.   Rosario Jacks, MD Cardiology Fellow, PGY-7

## 2019-12-24 NOTE — Progress Notes (Signed)
  Echocardiogram 2D Echocardiogram limited has been performed.  Leta Jungling M 12/24/2019, 1:18 PM

## 2019-12-24 NOTE — Progress Notes (Signed)
Post-PCI Check: Pt continues to have chest discomfort with a pleuritic component after PCI today. Symptoms are improved but pain persists. He is hemodynamically stable (remains in afib). Echo in the cath lab showed no significant effusion/hemopericardium. Will check enzymes and continue to follow closely. Repeat echo in am. Wife updated at bedside.  [name] 12/24/2019 6:17 PM

## 2019-12-24 NOTE — Interval H&P Note (Signed)
Cath Lab Visit (complete for each Cath Lab visit)  Clinical Evaluation Leading to the Procedure:   ACS: No.  Non-ACS:    Anginal Classification: CCS III  Anti-ischemic medical therapy: Minimal Therapy (1 class of medications)  Non-Invasive Test Results: No non-invasive testing performed  Prior CABG: Previous CABG      History and Physical Interval Note:  12/24/2019 11:18 AM  Garrett Hughes  has presented today for surgery, with the diagnosis of coronary artery disease.  The various methods of treatment have been discussed with the patient and family. After consideration of risks, benefits and other options for treatment, the patient has consented to  Procedure(s): CORONARY ATHERECTOMY (N/A) as a surgical intervention.  The patient's history has been reviewed, patient examined, no change in status, stable for surgery.  I have reviewed the patient's chart and labs.  Questions were answered to the patient's satisfaction.     [name]

## 2019-12-25 DIAGNOSIS — I5033 Acute on chronic diastolic (congestive) heart failure: Secondary | ICD-10-CM | POA: Diagnosis not present

## 2019-12-25 DIAGNOSIS — R55 Syncope and collapse: Secondary | ICD-10-CM | POA: Diagnosis not present

## 2019-12-25 DIAGNOSIS — I2510 Atherosclerotic heart disease of native coronary artery without angina pectoris: Secondary | ICD-10-CM | POA: Diagnosis not present

## 2019-12-25 DIAGNOSIS — I4821 Permanent atrial fibrillation: Secondary | ICD-10-CM | POA: Diagnosis not present

## 2019-12-25 DIAGNOSIS — I110 Hypertensive heart disease with heart failure: Secondary | ICD-10-CM | POA: Diagnosis not present

## 2019-12-25 DIAGNOSIS — Z20822 Contact with and (suspected) exposure to covid-19: Secondary | ICD-10-CM | POA: Diagnosis not present

## 2019-12-25 DIAGNOSIS — I452 Bifascicular block: Secondary | ICD-10-CM | POA: Diagnosis not present

## 2019-12-25 DIAGNOSIS — I25110 Atherosclerotic heart disease of native coronary artery with unstable angina pectoris: Secondary | ICD-10-CM | POA: Diagnosis not present

## 2019-12-25 LAB — BASIC METABOLIC PANEL
Anion gap: 8 (ref 5–15)
BUN: 15 mg/dL (ref 8–23)
CO2: 24 mmol/L (ref 22–32)
Calcium: 8.5 mg/dL — ABNORMAL LOW (ref 8.9–10.3)
Chloride: 105 mmol/L (ref 98–111)
Creatinine, Ser: 0.91 mg/dL (ref 0.61–1.24)
GFR calc Af Amer: >60 mL/min (ref >60)
GFR calc non Af Amer: >60 mL/min (ref >60)
Glucose, Bld: 124 mg/dL — ABNORMAL HIGH (ref 70–99)
Potassium: 4 mmol/L (ref 3.5–5.1)
Sodium: 137 mmol/L (ref 135–145)

## 2019-12-25 LAB — CK TOTAL AND CKMB (NOT AT ARMC)
CK, MB: 42.7 ng/mL — ABNORMAL HIGH (ref 0.5–5.0)
Relative Index: 13.4 — ABNORMAL HIGH (ref 0.0–2.5)
Total CK: 318 U/L (ref 49–397)

## 2019-12-25 LAB — GLUCOSE, CAPILLARY
Glucose-Capillary: 152 mg/dL — ABNORMAL HIGH (ref 70–99)
Glucose-Capillary: 166 mg/dL — ABNORMAL HIGH (ref 70–99)
Glucose-Capillary: 143 mg/dL — ABNORMAL HIGH (ref 70–99)
Glucose-Capillary: 221 mg/dL — ABNORMAL HIGH (ref 70–99)

## 2019-12-25 LAB — CBC
HCT: 29 % — ABNORMAL LOW (ref 39.0–52.0)
Hemoglobin: 9.5 g/dL — ABNORMAL LOW (ref 13.0–17.0)
MCH: 32.9 pg (ref 26.0–34.0)
MCHC: 32.8 g/dL (ref 30.0–36.0)
MCV: 100.3 fL — ABNORMAL HIGH (ref 80.0–100.0)
Platelets: 124 10*3/uL — ABNORMAL LOW (ref 150–400)
RBC: 2.89 MIL/uL — ABNORMAL LOW (ref 4.22–5.81)
RDW: 14.2 % (ref 11.5–15.5)
WBC: 9.3 10*3/uL (ref 4.0–10.5)
nRBC: 0 % (ref 0.0–0.2)

## 2019-12-25 LAB — MAGNESIUM: Magnesium: 1.5 mg/dL — ABNORMAL LOW (ref 1.7–2.4)

## 2019-12-25 MED ORDER — MAGNESIUM SULFATE 2 GM/50ML IV SOLN
INTRAVENOUS | Status: AC
  Filled 2019-12-25: qty 50

## 2019-12-25 MED ORDER — MAGNESIUM SULFATE 2 GM/50ML IV SOLN
2.0000 g | Freq: Once | INTRAVENOUS | Status: AC
  Administered 2019-12-25: 01:00:00 2 g via INTRAVENOUS

## 2019-12-25 NOTE — Progress Notes (Signed)
CARDIAC REHAB PHASE I   PRE:  Rate/Rhythm: 73 Afib  BP:  Sitting: 94/67      SaO2: 99 RA  MODE:  Ambulation: 290 ft   POST:  Rate/Rhythm: 96 Afib  BP:  Sitting: 131/75    SaO2: 97 RA  Pt ambulated 269ft in hallway handheld assist with slow steady gait. Pt with SOB at end of walk, needs TAVR. Pt returned to bed. Pt educated on importance of ASA and Plavix. Reviewed site care and restrictions. Encouraged ambulation as able with emphasis on listening to his symptoms. Will refer to CRP II [place] with knowledge that pt needs TAVR. Pt on phone with wife, call bell and bedside table within reach.  [phone] Reynold Bowen, RN BSN 12/25/2019 1:54 PM

## 2019-12-25 NOTE — Progress Notes (Signed)
Progress Note  Patient Name: Garrett Hughes Date of Encounter: 12/25/2019  Primary Cardiologist:   Olga Millers, MD   Subjective   MD called to bedside yesterday as the patient had hypotension.  However, he had no complaints.  Bedside ultrasound with no evidence of tamponade.  BP improved this AM.  He feels great this morning and is sitting in a chair.  No pain.  No SOB.   Inpatient Medications    Scheduled Meds: . aspirin  81 mg Oral Daily  . Chlorhexidine Gluconate Cloth  6 each Topical Daily  . clopidogrel  75 mg Oral Daily  . doxazosin  8 mg Oral Daily  . ferrous sulfate  325 mg Oral Q breakfast  . insulin aspart  0-15 Units Subcutaneous TID WC  . metoprolol succinate  25 mg Oral Daily  . pantoprazole  20 mg Oral Daily  . pravastatin  40 mg Oral Daily  . sodium chloride flush  3 mL Intravenous Q12H  . sodium chloride flush  3 mL Intravenous Q12H   Continuous Infusions: . sodium chloride     PRN Meds: sodium chloride, acetaminophen, morphine injection, nitroGLYCERIN, ondansetron (ZOFRAN) IV, oxyCODONE, sodium chloride flush   Vital Signs    Vitals:   12/25/19 0530 12/25/19 0600 12/25/19 0630 12/25/19 0700  BP: (!) 85/53 126/71 (!) 115/56 (!) 137/98  Pulse: 82 (!) 53 (!) 59 75  Resp: 19 20 13  (!) 22  Temp:      TempSrc:      SpO2: 94% 99% 100% 97%  Weight:   65.3 kg   Height:        Intake/Output Summary (Last 24 hours) at 12/25/2019 0824 Last data filed at 12/25/2019 0600 Gross per 24 hour  Intake 1492.82 ml  Output 600 ml  Net 892.82 ml   Filed Weights   12/24/19 1038 12/25/19 0630  Weight: 64.4 kg 65.3 kg    Telemetry    Frequent ventricular ectopy last night, atrial fib and rate controlled this AM.   - Personally Reviewed  ECG    Atrial fib.  RBBB, Rate 83.  - Personally Reviewed  Physical Exam   GEN: No acute distress.   Neck: No  JVD Cardiac: Irregular RR, 3/6 systolic murmurs, rubs, or gallops.  Respiratory: Clear to auscultation  bilaterally. GI: Soft, nontender, non-distended  MS: No  edema; No deformity.  Right radial access site without bleeding or brusing Neuro:  Nonfocal  Psych: Normal affect   Labs    Chemistry Recent Labs  Lab 12/21/19 0918 12/25/19 0014  NA 136 137  K 4.1 4.0  CL 100 105  CO2 26 24  GLUCOSE 159* 124*  BUN 24 15  CREATININE 0.96 0.91  CALCIUM 9.1 8.5*  GFRNONAA 75 >60  GFRAA 87 >60  ANIONGAP  --  8     Hematology Recent Labs  Lab 12/21/19 0918 12/25/19 0251  WBC 8.3 9.3  RBC 3.35* 2.89*  HGB 10.9* 9.5*  HCT 33.0* 29.0*  MCV 99* 100.3*  MCH 32.5 32.9  MCHC 33.0 32.8  RDW 14.1 14.2  PLT 144* 124*    Cardiac EnzymesNo results for input(s): TROPONINI in the last 168 hours. No results for input(s): TROPIPOC in the last 168 hours.   BNPNo results for input(s): BNP, PROBNP in the last 168 hours.   DDimer No results for input(s): DDIMER in the last 168 hours.   Radiology    CARDIAC CATHETERIZATION  Result Date: 12/24/2019 Successful orbital atherectomy  and stenting of the RCA with 3 overlapping DES, complicated by small coronary perforation requiring covered stent placement (4.0x20 mm Papyrus)  ECHOCARDIOGRAM LIMITED  Result Date: 12/24/2019    ECHOCARDIOGRAM LIMITED REPORT   Patient Name:   Garrett Hughes [facility] Date of Exam: 12/24/2019 Medical Rec #:  [id number]     Height:       65.0 in Accession #:    [id number]    Weight:       142.0 lb Date of Birth:  [birth year]     BSA:          1.710 m Patient Age:    79 years      BP:           105/63 mmHg Patient Gender: M             HR:           53 bpm. Exam Location:  Inpatient Procedure: Limited Echo Indications:    Pericardial effusion 423.9 / I31.3  History:        Patient has prior history of Echocardiogram examinations, most                 recent 10/19/2019. CAD, Prior CABG, Aortic Valve Disease,                 Arrythmias:Atrial Fibrillation, Signs/Symptoms:Chest Pain; Risk                 Factors:Diabetes, Dyslipidemia and  Hypertension. GERD.  Sonographer:    Leta Jungling RDCS Referring Phys: Bryan Lemma MD  Sonographer Comments: Echo performed during CATH to check for pericardial effusion IMPRESSIONS  1. Limited echo for pericardial effusion Patient is post atherectomy of RCA with perforation covered with stent. No significant pericardial effusion is seen. FINDINGS  Additional Comments: Limited echo for pericardial effusion Patient is post atherectomy of RCA with perforation covered with stent. No significant pericardial effusion is seen. Charlton Haws MD Electronically signed by Charlton Haws MD Signature Date/Time: 12/24/2019/2:56:07 PM    Final     Cardiac Studies   Prox RCA lesion  Stent  Pre-stent angioplasty was performed using a BALLOON SAPPHIRE 3.0X12. A drug-eluting stent was successfully placed using a STENT RESOLUTE ONYX 3.5X38.  Post-Intervention Lesion Assessment  The intervention was successful. Pre-interventional TIMI flow is 3. Post-intervention TIMI flow is 3. At this lesion, a perforation of the vessel occurred. Coronary Perforation treated with a Papyrus covered stent  There is a 0% residual stenosis post intervention.  Prox RCA to Mid RCA lesion  Stent  A drug-eluting stent was successfully placed using a STENT RESOLUTE ONYX 3.5X12.  Post-Intervention Lesion Assessment  The intervention was successful. Pre-interventional TIMI flow is 3. Post-intervention TIMI flow is 3. No complications occurred at this lesion.  There is a 0% residual stenosis post intervention.  Mid RCA lesion  Stent  A drug-eluting stent was successfully placed using a STENT RESOLUTE ONYX 3.5X18.  Post-Intervention Lesion Assessment  The intervention was successful. Pre-interventional TIMI flow is 3. Post-intervention TIMI flow is 3. No complications occurred at this lesion.  There is a 0% residual stenosis post intervention.    Diagnostic Dominance: Right  Intervention     Patient Profile     79 y.o. male    He had PCI as above complicated by perforation of the proximal RCA.  He has chronic atrial fib and severe AS.    Assessment & Plan    CAD/Status post PCI:  Plan repeat echo  today.  Hgb is fluctuating around what appears to be his baseline.    Clinically doing well.  I will observe one more night and repeat a limited echo this morning.   ANEMIA:  Stable as above.   AS:  Plan for eventual TAVR.   ATRIAL FIB:  The patient has not wanted anticoagulation in the past. His rate is controlled.    HTN:  BP is low and holding lisinopril.  Likely can resume in the AM at discharge  DM:  Will resume previous meds at discharge.  Holding Glucophage post cat.    For questions or updates, please contact [place] Please consult [email or web address] for contact info under Cardiology/STEMI.   Signed, Minus Breeding, MD  12/25/2019, 8:24 AM

## 2019-12-26 ENCOUNTER — Encounter (HOSPITAL_COMMUNITY): Payer: Self-pay | Admitting: Cardiovascular Disease

## 2019-12-26 ENCOUNTER — Observation Stay (HOSPITAL_BASED_OUTPATIENT_CLINIC_OR_DEPARTMENT_OTHER): Payer: Medicare Other

## 2019-12-26 DIAGNOSIS — R0609 Other forms of dyspnea: Secondary | ICD-10-CM

## 2019-12-26 DIAGNOSIS — R079 Chest pain, unspecified: Secondary | ICD-10-CM

## 2019-12-26 DIAGNOSIS — I25110 Atherosclerotic heart disease of native coronary artery with unstable angina pectoris: Secondary | ICD-10-CM

## 2019-12-26 DIAGNOSIS — I2510 Atherosclerotic heart disease of native coronary artery without angina pectoris: Secondary | ICD-10-CM | POA: Diagnosis not present

## 2019-12-26 LAB — GLUCOSE, CAPILLARY
Glucose-Capillary: 157 mg/dL — ABNORMAL HIGH (ref 70–99)
Glucose-Capillary: 184 mg/dL — ABNORMAL HIGH (ref 70–99)
Glucose-Capillary: 266 mg/dL — ABNORMAL HIGH (ref 70–99)
Glucose-Capillary: 84 mg/dL (ref 70–99)

## 2019-12-26 LAB — ECHOCARDIOGRAM LIMITED
Height: 64 in
Weight: 2262.4 oz

## 2019-12-26 MED ORDER — LISINOPRIL 5 MG PO TABS: 2.5000 mg | ORAL_TABLET | Freq: Every day | ORAL | Status: DC

## 2019-12-26 MED ORDER — LORATADINE 10 MG PO TABS: 10.0000 mg | ORAL_TABLET | Freq: Once | ORAL | Status: DC

## 2019-12-26 MED ORDER — FUROSEMIDE 40 MG PO TABS: 20.0000 mg | ORAL_TABLET | Freq: Every day | ORAL | Status: DC

## 2019-12-26 MED ORDER — LORATADINE 10 MG PO TABS: 10.0000 mg | ORAL_TABLET | Freq: Every day | ORAL | Status: DC

## 2019-12-26 NOTE — Progress Notes (Signed)
  Echocardiogram 2D Echocardiogram has been performed.  Gerda Diss 12/26/2019, 9:21 AM

## 2019-12-26 NOTE — Progress Notes (Signed)
Progress Note  Patient Name: Garrett Hughes Date of Encounter: 12/26/2019  Primary Cardiologist:   Kirk Ruths, MD   Subjective   Very anxious to go home.  No pain.  Chronic SOB.   Inpatient Medications    Scheduled Meds: . aspirin  81 mg Oral Daily  . Chlorhexidine Gluconate Cloth  6 each Topical Daily  . clopidogrel  75 mg Oral Daily  . doxazosin  8 mg Oral Daily  . ferrous sulfate  325 mg Oral Q breakfast  . insulin aspart  0-15 Units Subcutaneous TID WC  . metoprolol succinate  25 mg Oral Daily  . pantoprazole  20 mg Oral Daily  . pravastatin  40 mg Oral Daily  . sodium chloride flush  3 mL Intravenous Q12H  . sodium chloride flush  3 mL Intravenous Q12H   Continuous Infusions: . sodium chloride     PRN Meds: sodium chloride, acetaminophen, morphine injection, nitroGLYCERIN, ondansetron (ZOFRAN) IV, oxyCODONE, sodium chloride flush   Vital Signs    Vitals:   12/26/19 0055 12/26/19 0328 12/26/19 0756 12/26/19 1241  BP: 107/68 107/68  122/70  Pulse: 74 74 96 98  Resp: 20 20    Temp: 98.1 F (36.7 C) 98.2 F (36.8 C)    TempSrc: Oral Oral    SpO2: 100% 100% 99% 94%  Weight:  64.1 kg    Height:        Intake/Output Summary (Last 24 hours) at 12/26/2019 1254 Last data filed at 12/26/2019 0923 Gross per 24 hour  Intake 3 ml  Output 900 ml  Net -897 ml   Filed Weights   12/25/19 0630 12/25/19 1530 12/26/19 0328  Weight: 65.3 kg 65.9 kg 64.1 kg    Telemetry    Atrial fib   - Personally Reviewed  ECG    NA.  - Personally Reviewed  Physical Exam   GEN: No  acute distress.   Neck: No  JVD Cardiac:   Irregular RR, 3/6 apical systolic murmur, no diastolic murmurs, rubs, or gallops.  Respiratory: Clear  to auscultation bilaterally. GI: Soft, nontender, non-distended, normal bowel sounds  MS:  No edema; No deformity. Neuro:   Nonfocal  Psych: Oriented and appropriate    Labs    Chemistry Recent Labs  Lab 12/21/19 0918 12/25/19 0014  NA  136 137  K 4.1 4.0  CL 100 105  CO2 26 24  GLUCOSE 159* 124*  BUN 24 15  CREATININE 0.96 0.91  CALCIUM 9.1 8.5*  GFRNONAA 75 >60  GFRAA 87 >60  ANIONGAP  --  8     Hematology Recent Labs  Lab 12/21/19 0918 12/25/19 0251  WBC 8.3 9.3  RBC 3.35* 2.89*  HGB 10.9* 9.5*  HCT 33.0* 29.0*  MCV 99* 100.3*  MCH 32.5 32.9  MCHC 33.0 32.8  RDW 14.1 14.2  PLT 144* 124*    Cardiac EnzymesNo results for input(s): TROPONINI in the last 168 hours. No results for input(s): TROPIPOC in the last 168 hours.   BNPNo results for input(s): BNP, PROBNP in the last 168 hours.   DDimer No results for input(s): DDIMER in the last 168 hours.   Radiology    ECHOCARDIOGRAM LIMITED  Result Date: 12/26/2019    ECHOCARDIOGRAM LIMITED REPORT   Patient Name:   Garrett Hughes Date of Exam: 12/26/2019 Medical Rec #:  [id number]     Height:       64.0 in Accession #:    [id number]  Weight:       141.4 lb Date of Birth:  December 19, 1940     BSA:          1.688 m Patient Age:    79 years      BP:           107/68 mmHg Patient Gender: M             HR:           96 bpm. Exam Location:  Inpatient Procedure: Limited Echo and Color Doppler Indications:    Chest pain 786.50/R07.9  History:        Patient has prior history of Echocardiogram examinations, most                 recent 12/25/2019. CAD, Prior CABG, Aortic Valve Disease,                 Arrythmias:Atrial Fibrillation; Risk Factors:Diabetes,                 Dyslipidemia and Hypertension. GERD.  Sonographer:    Ross Ludwig RDCS (AE) Referring Phys: [id number] CADENCE H FURTH IMPRESSIONS  1. Limited evaluation of pericardial effusion. Trivial effusion is present that is not clinically significant.  2. Left ventricular ejection fraction, by estimation, is 60 to 65%. The left ventricle has normal function. The left ventricle has no regional wall motion abnormalities.  3. Right ventricular systolic function is normal. The right ventricular size is mildly enlarged.  4. The  inferior vena cava is dilated in size with <50% respiratory variability, suggesting right atrial pressure of 15 mmHg. FINDINGS  Left Ventricle: Left ventricular ejection fraction, by estimation, is 60 to 65%. The left ventricle has normal function. The left ventricle has no regional wall motion abnormalities. Abnormal (paradoxical) septal motion consistent with post-operative status. Right Ventricle: The right ventricular size is mildly enlarged. No increase in right ventricular wall thickness. Right ventricular systolic function is normal. Pericardium: Trivial pericardial effusion is present. There is no evidence of cardiac tamponade. Aortic Valve: There is severe thickening of the aortic valve. There is severe calcifcation of the aortic valve. Venous: The inferior vena cava is dilated in size with less than 50% respiratory variability, suggesting right atrial pressure of 15 mmHg.  IVC IVC diam: 2.30 cm Lennie Odor MD Electronically signed by Lennie Odor MD Signature Date/Time: 12/26/2019/11:58:48 AM    Final    ECHOCARDIOGRAM LIMITED  Result Date: 12/24/2019    ECHOCARDIOGRAM LIMITED REPORT   Patient Name:   Garrett Hughes [facility] Date of Exam: 12/24/2019 Medical Rec #:  [id number]     Height:       65.0 in Accession #:    [id number]    Weight:       142.0 lb Date of Birth:  09-17-1940     BSA:          1.710 m Patient Age:    79 years      BP:           105/63 mmHg Patient Gender: M             HR:           53 bpm. Exam Location:  Inpatient Procedure: Limited Echo Indications:    Pericardial effusion 423.9 / I31.3  History:        Patient has prior history of Echocardiogram examinations, most                 recent  10/19/2019. CAD, Prior CABG, Aortic Valve Disease,                 Arrythmias:Atrial Fibrillation, Signs/Symptoms:Chest Pain; Risk                 Factors:Diabetes, Dyslipidemia and Hypertension. GERD.  Sonographer:    Leta Jungling RDCS Referring Phys: Bryan Lemma MD  Sonographer Comments: Echo  performed during CATH to check for pericardial effusion IMPRESSIONS  1. Limited echo for pericardial effusion Patient is post atherectomy of RCA with perforation covered with stent. No significant pericardial effusion is seen. FINDINGS  Additional Comments: Limited echo for pericardial effusion Patient is post atherectomy of RCA with perforation covered with stent. No significant pericardial effusion is seen. Charlton Haws MD Electronically signed by Charlton Haws MD Signature Date/Time: 12/24/2019/2:56:07 PM    Final     Cardiac Studies   Prox RCA lesion  Stent  Pre-stent angioplasty was performed using a BALLOON SAPPHIRE 3.0X12. A drug-eluting stent was successfully placed using a STENT RESOLUTE ONYX 3.5X38.  Post-Intervention Lesion Assessment  The intervention was successful. Pre-interventional TIMI flow is 3. Post-intervention TIMI flow is 3. At this lesion, a perforation of the vessel occurred. Coronary Perforation treated with a Papyrus covered stent  There is a 0% residual stenosis post intervention.  Prox RCA to Mid RCA lesion  Stent  A drug-eluting stent was successfully placed using a STENT RESOLUTE ONYX 3.5X12.  Post-Intervention Lesion Assessment  The intervention was successful. Pre-interventional TIMI flow is 3. Post-intervention TIMI flow is 3. No complications occurred at this lesion.  There is a 0% residual stenosis post intervention.  Mid RCA lesion  Stent  A drug-eluting stent was successfully placed using a STENT RESOLUTE ONYX 3.5X18.  Post-Intervention Lesion Assessment  The intervention was successful. Pre-interventional TIMI flow is 3. Post-intervention TIMI flow is 3. No complications occurred at this lesion.  There is a 0% residual stenosis post intervention.    Diagnostic Dominance: Right  Intervention    ECHO:    1. Limited evaluation of pericardial effusion. Trivial effusion is  present that is not clinically significant.  2. Left ventricular ejection  fraction, by estimation, is 60 to 65%. The  left ventricle has normal function. The left ventricle has no regional  wall motion abnormalities.  3. Right ventricular systolic function is normal. The right ventricular  size is mildly enlarged.  4. The inferior vena cava is dilated in size with <50% respiratory  variability, suggesting right atrial pressure of 15 mmHg.   Patient Profile     79 y.o. male   He had PCI as above complicated by perforation of the proximal RCA.  He has chronic atrial fib and severe AS.    Assessment & Plan    CAD/Status post PCI:  Trivial pericardial effusion.   No evidence of ongoing issues related to PCI.  Will follow closely as an out patient.   AS:  Plan for eventual TAVR.   ATRIAL FIB:  The patient has not wanted anticoagulation in the past. His rate is controlled.  No change in therapy.   HTN:  BP is improved.  OK to start lisinopril 2.5 mg once daily at discharge.   DM:  Will resume previous meds at discharge.    Needs TOC follow up within 7 days.    For questions or updates, please contact [facility] Please consult [email or web address] for contact info under Cardiology/STEMI.   Signed, Rollene Rotunda, MD  12/26/2019, 12:54 PM

## 2019-12-26 NOTE — Discharge Summary (Signed)
Discharge Summary    Patient ID: Garrett Hughes MRN: [id number]; DOB: April 04, 1941  Admit date: 12/24/2019 Discharge date: 12/26/2019  Primary Care Provider: Emeterio Reeve, DO  Primary Cardiologist: Kirk Ruths, MD  Primary Electrophysiologist:  None   Discharge Diagnoses    Principal Problem:   Coronary artery disease involving native coronary artery of native heart with angina pectoris [facility]) Active Problems:   Nonrheumatic aortic valve stenosis   Type 2 diabetes mellitus with kidney complication, without long-term current use of insulin ([place])   Hyperlipidemia   Atrial fibrillation ([place])   DOE (dyspnea on exertion)    Diagnostic Studies/Procedures     Limited Echo 12/26/19 IMPRESSIONS   1. Limited evaluation of pericardial effusion. Trivial effusion is  present that is not clinically significant.  2. Left ventricular ejection fraction, by estimation, is 60 to 65%. The  left ventricle has normal function. The left ventricle has no regional  wall motion abnormalities.  3. Right ventricular systolic function is normal. The right ventricular  size is mildly enlarged.  4. The inferior vena cava is dilated in size with <50% respiratory  variability, suggesting right atrial pressure of 15 mmHg.   FINDINGS  Left Ventricle: Left ventricular ejection fraction, by estimation, is 60  to 65%. The left ventricle has normal function. The left ventricle has no  regional wall motion abnormalities. Abnormal (paradoxical) septal motion  consistent with post-operative  status.   Right Ventricle: The right ventricular size is mildly enlarged. No  increase in right ventricular wall thickness. Right ventricular systolic  function is normal.   Pericardium: Trivial pericardial effusion is present. There is no evidence  of cardiac tamponade.   Aortic Valve: There is severe thickening of the aortic valve. There is  severe calcifcation of the aortic valve.   Venous: The  inferior vena cava is dilated in size with less than 50%  respiratory variability, suggesting right atrial pressure of 15 mmHg.   IVC  IVC diam: 2.30 cm   Eleonore Chiquito MD  Electronically signed by Eleonore Chiquito MD  Signature Date/Time: 12/26/2019/11:58:48 AM   Coronary Cath 12/24/19 Conclusion  Successful orbital atherectomy and stenting of the RCA with 3 overlapping DES, complicated by small coronary perforation requiring covered stent placement (4.0x20 mm Papyrus)   Technical Details INDICATION: Coronary artery disease with angina.  79 year old gentleman with previous CABG, undergoing TAVR evaluation for severe symptomatic aortic stenosis.  Preprocedure cardiac catheterization demonstrated critical calcific stenosis of the mid RCA and the patient presents today for atherectomy and stenting.  The RCA is a diffusely calcified and moderately diseased vessel.  PROCEDURAL DETAILS: The right wrist is prepped, draped, and anesthetized with 1% lidocaine. Using the modified Seldinger technique, a 5/6 French Slender sheath is introduced into the right radial artery. 3 mg of verapamil is administered through the sheath, weight-based unfractionated heparin was administered intravenously.  Initially, a 6 [place] guide is used.  Heparin is administered for anticoagulation and a therapeutic ACT is achieved.  A Viper flex wire is advanced across the lesion into the distal RCA once a therapeutic ACT is achieved.  A CSI orbital atherectomy catheter is prepped using normal technique and a 1.25 mm crown is advanced into the mid vessel.  Multiple runs were made at low and high speed in order to adequately treat the calcified lesion in the mid vessel.  The device is removed without complication.  A 3.0 mm balloon is then advanced with a moderate amount of difficulty into the lesion site  and it is dilated to 8 atm.  I tried to advance a 3.5 x 18 mm resolute Onyx DES but it would not cross the proximal vessel due to  calcification and moderate stenosis.  After further angiographic evaluation, I elected to treat the proximal vessel as I felt there was hemodynamically significant disease and also did not feel the mid lesion could be stented without treating the proximal vessel.  Guide catheter exchanged out for a 6 [place] AL-1 guide.  The Viper flex wire was again advanced into the distal vessel.  Atherectomy is again performed using a CSI 1.25 mm crown at low and high speed.  A 3.0 balloon is again advanced and dilated over the proximal segment.  I decided to treat back to the ostium with a 3.5 x 38 mm resolute Onyx DES deployed at 16 atm.  There was a short gap between the 2 stents and this is covered with a 3.5 x 12 mm resolute Onyx DES.  After stenting the vessel was postdilated with a 3.75 mm noncompliant balloon.  Further angiography demonstrates a small amount of dye extravasation through the proximal stented segment consistent with a perforation.  The patient remained hemodynamically stable but began to have more chest pain.  I elected to treat the segment with a 4.0 x 20 mm Papyrus covered stent.  The stent is deployed at burst pressure of 14 atm.  There continued to be a small amount of extravasation with modest improvement after the covered stent.  Therefore, the stent is postdilated with a 4.5 mm noncompliant balloon in order to obtain a better seal.  After noncompliant balloon postdilatation there is minimal dye seen at the perforation site.  The patient remained hemodynamically stable throughout.  A stat echocardiogram was performed in the cardiac catheterization lab and there is no pericardial effusion present.  The guide catheter and wire were removed and a TR band is used for radial hemostasis at the completion of the procedure.  The patient was transferred to the post catheterization recovery area for further monitoring.    Estimated blood loss <50 mL.   During this procedure medications were administered  to achieve and maintain moderate conscious sedation while the patient's heart rate, blood pressure, and oxygen saturation were continuously monitored and I was present face-to-face 100% of this time.      _____________   History of Present Illness     Garrett Hughes is a 79 y.o. male with CAD s/p CABG 1997, chronic atrial fibrillation (pt declines anticoagulation), aortic stenosis, carotid artery disease, HTN, DM, HLD (intolerant of higher dose of pravastatin or atorvastatin), chronic appearing anemia/thrombocytopenia, GERD, Barrett's esophagus, h/o GIB and urinary tract bleeding who presented to [facility] for planned cardiac catheterization. He recently was seen in clinic and reported increased dyspnea with more moderate activities. Echocardiogram February 2021 showed normal LV function, mild left ventricular hypertrophy, severe left atrial enlargement, moderate mitral regurgitation, moderate tricuspid regurgitation, severe aortic stenosis with mean gradient 39 mmHg and mild to moderate aortic insufficiency. Dr. Stanford Breed recommended proceeding with evaluation for possible TAVR, starting with R/L heart catheterization. He underwent this procedure 11/26/19 showing severe multivessel coronary artery disease with occlusion of the left coronary artery and severe calcific stenosis of the right coronary artery, and continued patency of the LIMA to LAD graft and saphenous vein graft OM, atretic RIMA to RCA. Plan at that time was to review with multidisciplinary heart team to likely bring back for atherectomy of the RCA. He  was already on ASA/Plavix.  In the meantime he also had outpatient carotid studies demonstrating 80-99% RICA and 73-53% LICA stenosis, warranting vascular surgery consultation. Per Dr. Stephens Shire note on 12/20/19 stated, "By ultrasound, his carotid bifurcation was found to be high.  I think he needs to undergo a CT angiogram of the neck to determine the extent of his disease, the degree  of stenosis, and to see if he is a candidate for TCAR.  Dr. Stanford Breed has stated that his aortic stenosis needs to be addressed prior to his carotid.  He is scheduled for TAVR in the immediate future.  I will get a CT angiogram in approximately 2 to 3 weeks and have him follow-up afterwards for surgical consideration."   Interventional cardiac cath was scheduled to proceed with planned PCI.   Hospital Course     He was brought back to the cath lab 12/24/19 for planned atherectomy and underwent successful orbital atherectomy and stenting of the RCA with 3 overlapping DES, complicated by small coronary perforation requiring covered stent placement (4.0x20 mm Papyrus). DAPT with Aspirin 69m daily and Clopidogrel 756mdaily long-term (beyond 12 months) was recommended because of covered stent placement. Limited echo was done during cath which showed no pericardial effusion. Post-procedure he did have some residual pleuritic chest pain that resolved without acute intervention. His blood pressure was soft. Mg level was low and therefore was repleted with IV magnesium. Follow-up limited echo today showed no pericardial effusion. He is feeling great today and blood pressure has improved. Dr. HoPercival Spanishas seen and examined the patient today and feels he is stable for discharge. TOC follow-up is requested - I have sent a message to our office's scheduling team requesting a follow-up appointment to be seen this week, and our office will call the patient with this information. Consider rechecking Mg level as outpatient.  Of note, Dr. HoPercival Spanishdjusted his medications downward this admission - his Lasix 4075mnd lisinopril 5mg39mve been on hold since admission. Dr. HochPercival Spanishld like him to reduce this to Lasix 20mg57m lisinopril 2.5mg f58mnow. He typically prefers his medications through mail-order in 90 day supplies. Wife says he has over 2 months' supply at home. Since these changes are to occur today, we have  provided instructions on how to use the doses he has at home to make the adjustment by cutting those tablet in half - if these doses are to remain permanent and not go back up, they will need to be sent into his pharmacy at time of follow-up. His wife will call us if [place] have any difficulty cutting the pills at home.  Did the patient have an acute coronary syndrome (MI, NSTEMI, STEMI, etc) this admission?:  No                               Did the patient have a percutaneous coronary intervention (stent / angioplasty)?:  Yes.     Cath/PCI Registry Performance & Quality Measures: 1. Aspirin prescribed? - Yes 2. ADP Receptor Inhibitor (Plavix/Clopidogrel, Brilinta/Ticagrelor or Effient/Prasugrel) prescribed (includes medically managed patients)? - Yes 3. High Intensity Statin (Lipitor 40-80mg o77mestor 20-40mg) p76mribed? - No - intolerant of higher intensity statins, LDL controlled by last labs 4. For EF <40%, was ACEI/ARB prescribed? - Not Applicable (EF >/= 40%) 5. 29% EF <40%, Aldosterone Antagonist (Spironolactone or Eplerenone) prescribed? - Not Applicable (EF >/= 40%) 6. 92%diac Rehab Phase II ordered (  Included Medically managed Patients)? - Yes   _____________  Discharge Vitals Blood pressure 122/70, pulse 98, temperature 98.2 F (36.8 C), temperature source Oral, resp. rate 20, height '5\' 4"'  (1.626 m), weight 64.1 kg, SpO2 94 %.  Filed Weights   12/25/19 0630 12/25/19 1530 12/26/19 0328  Weight: 65.3 kg 65.9 kg 64.1 kg    Labs & Radiologic Studies    CBC Recent Labs    12/25/19 0251  WBC 9.3  HGB 9.5*  HCT 29.0*  MCV 100.3*  PLT 562*   Basic Metabolic Panel Recent Labs    12/25/19 0014  NA 137  K 4.0  CL 105  CO2 24  GLUCOSE 124*  BUN 15  CREATININE 0.91  CALCIUM 8.5*  MG 1.5*   Hemoglobin A1C Recent Labs    12/24/19 1850  HGBA1C 6.9*   _____________  CARDIAC CATHETERIZATION  Result Date: 12/24/2019 Successful orbital atherectomy and stenting of the  RCA with 3 overlapping DES, complicated by small coronary perforation requiring covered stent placement (4.0x20 mm Papyrus)  CT CORONARY MORPH W/CTA COR W/SCORE W/CA W/CM &/OR WO/CM  Addendum Date: 12/12/2019   ADDENDUM REPORT: 12/12/2019 18:39 CLINICAL DATA:  Aortic stenosis EXAM: Cardiac TAVR CT TECHNIQUE: The patient was scanned on a Siemens Force 130 slice scanner. A 120 kV retrospective scan was triggered in the descending thoracic aorta at 111 HU's. Gantry rotation speed was 270 msecs and collimation was .9 mm. No beta blockade or nitro were given. The 3D data set was reconstructed in 5% intervals of the R-R cycle. Systolic and diastolic phases were analyzed on a dedicated work station using MPR, MIP and VRT modes. The patient received 181m OMNIPAQUE IOHEXOL 350 MG/ML SOLN of contrast. FINDINGS: Motion artifact reduces the diagnostic quality of this exam. Aortic Valve: Tricuspid aortic valve. Severely reduced cusp separation. Severely thickened, severely calcified aortic valve cusps. AV calcium score: 1593 Annulus measurements made at 5% of R-R interval to reduce motion artifact. Virtual Basal Annulus Measurements: Maximum/Minimum Diameter: 27.8 x 21.2 mm Perimeter: 78.3 mm Area: 466 mm2 LVOT calcification present. Based on these measurements, the annulus would be suitable for a 26 mm Sapien 3 valve. Sinus of Valsalva Measurements: Non-coronary:  33 mm Right - coronary:  31 mm Left - coronary:  32 mm Sinus of Valsalva Height: Left: 20 mm Right: 20.5 mm Aorta: Severe calcifications. Sinotubular Junction:  28 mm Ascending Thoracic Aorta:  38 mm Aortic Arch:  28 mm Descending Thoracic Aorta:  29 mm Coronary Artery Height above Annulus: Left Main: 16.5 mm. Left main is severely calcified and is known to be occluded. Right Coronary: 15.3 mm Coronary Arteries: Severe 3 vessel coronary artery calcifications. S/p CABG. LIMA graft to LAD and SVG to L circumflex system appear patent. Optimum Fluoroscopic Angle for  Delivery: RAO 0, CRA 1 No left atrial appendage thrombus. IMPRESSION: 1. Tricuspid aortic valve. Severely reduced cusp separation. Severely thickened, severely calcified aortic valve cusps. 2.  AV calcium score 1593 3. Annulus area: 466 mm2. The annulus would be suitable for a 26 mm Sapien 3 valve. LVOT calcifications. 4. Adequate right coronary height from the annulus. Left coronary artery height is adequate but is known to be occluded. 5.  Borderline mid ascending aorta dilation, 38 mm. 6. Optimum Fluoroscopic Angle for Delivery: RAO 0, CRA 1 Electronically Signed   By: GCherlynn Kaiser  On: 12/12/2019 18:39   Result Date: 12/12/2019 EXAM: OVER-READ INTERPRETATION  CT CHEST The following report is an over-read performed by  radiologist Dr. Vinnie Langton of [facility], [place] on 12/08/2019. This over-read does not include interpretation of cardiac or coronary anatomy or pathology. The coronary calcium score/coronary CTA interpretation by the cardiologist is attached. COMPARISON:  CT the chest, abdomen and pelvis 07/28/2019. FINDINGS: Extracardiac findings will be described separately under dictation for contemporaneously obtained CTA chest, abdomen and pelvis. IMPRESSION: Please see separate dictation for contemporaneously obtained CTA chest, abdomen and pelvis dated 12/08/2019 for full description of relevant extracardiac findings. Electronically Signed: By: Vinnie Langton M.D. On: 12/08/2019 12:51   CT ANGIO CHEST AORTA W/CM & OR WO/CM  Result Date: 12/08/2019 CLINICAL DATA:  79 year old male with history of severe aortic stenosis. Preprocedural study prior to potential transcatheter aortic valve replacement (TAVR) procedure. EXAM: CT ANGIOGRAPHY CHEST, ABDOMEN AND PELVIS TECHNIQUE: Multidetector CT imaging through the chest, abdomen and pelvis was performed using the standard protocol during bolus administration of intravenous contrast. Multiplanar reconstructed images and MIPs were obtained and  reviewed to evaluate the vascular anatomy. CONTRAST:  178m OMNIPAQUE IOHEXOL 350 MG/ML SOLN COMPARISON:  CT the abdomen and pelvis 02/18/2012. FINDINGS: CTA CHEST FINDINGS Cardiovascular: Heart size is mildly enlarged. There is no significant pericardial fluid, thickening or pericardial calcification. There is aortic atherosclerosis, as well as atherosclerosis of the great vessels of the mediastinum and the coronary arteries, including calcified atherosclerotic plaque in the left main, left anterior descending, left circumflex and right coronary arteries. Status post median sternotomy for CABG including LIMA to the LAD. Severe thickening and calcification of the aortic valve. Calcifications of the mitral annulus and mitral valve. Mediastinum/Lymph Nodes: No pathologically enlarged mediastinal or hilar lymph nodes. Moderate hiatal hernia. No axillary lymphadenopathy. Lungs/Pleura: Widespread areas of mild ground-glass attenuation and interlobular septal thickening noted throughout the lungs bilaterally, favored to reflect a background of mild pulmonary edema. Trace bilateral pleural effusions lying dependently (right greater than left). No confluent consolidative airspace disease. No suspicious appearing pulmonary nodules or masses are noted. Musculoskeletal/Soft Tissues: Median sternotomy wires. There are no aggressive appearing lytic or blastic lesions noted in the visualized portions of the skeleton. Multiple vertebral body compression fractures at the T3, T7 and T8, most severe at T3 where there is 30% loss of anterior vertebral body height. CTA ABDOMEN AND PELVIS FINDINGS Hepatobiliary: Liver has a nodular contour, which may suggest early changes of cirrhosis. No suspicious cystic or solid hepatic lesions. No intra or extrahepatic biliary ductal dilatation. Gallbladder is normal in appearance. Pancreas: Diffuse pancreatic atrophy. No pancreatic mass. No pancreatic ductal dilatation. No pancreatic or  peripancreatic fluid collections or inflammatory changes. Spleen: Unremarkable. Adrenals/Urinary Tract: 1.7 cm low-attenuation lesion in the lower pole the right kidney, compatible with a simple cyst. Left kidney and bilateral adrenal glands are normal in appearance. No hydroureteronephrosis. Urinary bladder is normal in appearance. Stomach/Bowel: Normal appearance of the intra-abdominal portion of the stomach. No pathologic dilatation of small bowel or colon. Numerous colonic diverticulae are noted, without definite surrounding inflammatory changes to suggest an acute diverticulitis at this time. Normal appendix. Vascular/Lymphatic: Aortic atherosclerosis, with vascular findings and measurements pertinent to potential TAVR procedure, as detailed below. No aneurysm or dissection noted in the abdominal or pelvic vasculature. No lymphadenopathy noted in the abdomen or pelvis. Reproductive: Prostate gland and seminal vesicles are unremarkable in appearance. Other: Trace volume of ascites noted in the visualized portions of the peritoneal cavity. No pneumoperitoneum. Musculoskeletal: Compression fractures are noted at L1 and L4, with post vertebroplasty changes at L4. There are no aggressive appearing lytic or  blastic lesions noted in the visualized portions of the skeleton. VASCULAR MEASUREMENTS PERTINENT TO TAVR: AORTA: Minimal Aortic Diameter-14 x 11 mm Severity of Aortic Calcification-severe RIGHT PELVIS: Right Common Iliac Artery - Minimal Diameter-6.1 x 4.2 mm Tortuosity-mild-to-moderate Calcification-severe Right External Iliac Artery - Minimal Diameter-7.7 x 2.9 mm Tortuosity-moderate Calcification-moderate Right Common Femoral Artery - Minimal Diameter-6.7 x 1.7 mm Tortuosity-mild Calcification-moderate LEFT PELVIS: Left Common Iliac Artery - Minimal Diameter-3.7 x 2.1 mm Tortuosity-mild-to-moderate Calcification-severe Left External Iliac Artery - Minimal Diameter-5.7 x 7.2 mm Tortuosity-moderate  Calcification-mild-to-moderate Left Common Femoral Artery - Minimal Diameter-7.7 x 3.2 mm Tortuosity-mild Calcification-moderate Review of the MIP images confirms the above findings. IMPRESSION: 1. Vascular findings and measurements pertinent to potential TAVR procedure, as detailed above. 2. Severe thickening calcification of the aortic valve, compatible with reported clinical history of severe aortic stenosis. 3. Aortic atherosclerosis, in addition to left main and 3 vessel coronary artery disease. Status post median sternotomy for CABG including LIMA to the LAD. 4. Mild cardiomegaly with findings suggestive of mild congestive heart failure, as above. 5. The appearance of the liver suggests underlying cirrhosis. 6. Diffuse pancreatic atrophy. 7. Severe colonic diverticulosis without evidence of acute diverticulitis at this time. 8. Additional incidental findings, as above. Electronically Signed   By: Vinnie Langton M.D.   On: 12/08/2019 14:09   VAS US CAROTID  Result Date: 12/08/2019 Carotid Arterial Duplex Study Indications:       Aortic stenosis. Risk Factors:      Diabetes. Limitations        Today's exam was limited due to the high bifurcation of the                    carotid, the patient's respiratory variation, patient                    movement and heavy calcification and the resulting shadowing. Comparison Study:  No prior studies. Performing Technologist: Oliver Hum RVT  Examination Guidelines: A complete evaluation includes B-mode imaging, spectral Doppler, color Doppler, and power Doppler as needed of all accessible portions of each vessel. Bilateral testing is considered an integral part of a complete examination. Limited examinations for reoccurring indications may be performed as noted.  Right Carotid Findings: +----------+--------+--------+--------+-----------------------+--------------+           PSV cm/sEDV cm/sStenosisPlaque Description     Comments        +----------+--------+--------+--------+-----------------------+--------------+ CCA Prox  32      5                                      tortuous       +----------+--------+--------+--------+-----------------------+--------------+ CCA Distal18      5               smooth and heterogenous               +----------+--------+--------+--------+-----------------------+--------------+ ICA Prox  491     171     80-99%  calcific               tortuous       +----------+--------+--------+--------+-----------------------+--------------+ ICA Mid   83      21              smooth and heterogenous               +----------+--------+--------+--------+-----------------------+--------------+ ICA Distal55      15  tortuous       +----------+--------+--------+--------+-----------------------+--------------+ ECA                                                      Not visualized +----------+--------+--------+--------+-----------------------+--------------+ +----------+--------+-------+--------+-------------------+           PSV cm/sEDV cmsDescribeArm Pressure (mmHG) +----------+--------+-------+--------+-------------------+ [id number]                                         +----------+--------+-------+--------+-------------------+ +---------+--------+--+--------+--+---------+ VertebralPSV cm/s86EDV cm/s35Antegrade +---------+--------+--+--------+--+---------+  Left Carotid Findings: +----------+--------+--------+--------+-----------------------+--------+           PSV cm/sEDV cm/sStenosisPlaque Description     Comments +----------+--------+--------+--------+-----------------------+--------+ CCA Prox  80      24                                     tortuous +----------+--------+--------+--------+-----------------------+--------+ CCA Mid   65      17              smooth and heterogenous          +----------+--------+--------+--------+-----------------------+--------+ CCA Distal81      24              smooth and heterogenous         +----------+--------+--------+--------+-----------------------+--------+ ICA Prox  232     56      40-59%  calcific               tortuous +----------+--------+--------+--------+-----------------------+--------+ ICA Mid                                                  tortuous +----------+--------+--------+--------+-----------------------+--------+ ICA Distal53      11                                     tortuous +----------+--------+--------+--------+-----------------------+--------+ ECA       193     41                                              +----------+--------+--------+--------+-----------------------+--------+ +----------+--------+--------+--------+-------------------+           PSV cm/sEDV cm/sDescribeArm Pressure (mmHG) +----------+--------+--------+--------+-------------------+ Subclavian204                                         +----------+--------+--------+--------+-------------------+ +---------+--------+---+--------+--+---------+ VertebralPSV cm/s118EDV cm/s43Antegrade +---------+--------+---+--------+--+---------+   Summary: Right Carotid: Velocities in the right ICA are consistent with a 80-99%                stenosis. Left Carotid: Velocities in the left ICA are consistent with a 40-59% stenosis. Vertebrals: Bilateral vertebral arteries demonstrate antegrade flow. *See table(s) above for measurements and observations.  Electronically signed by Curt Jews MD on 12/08/2019 at 6:48:33 PM.  Final    ECHOCARDIOGRAM LIMITED  Result Date: 12/26/2019    ECHOCARDIOGRAM LIMITED REPORT   Patient Name:   Garrett Hughes [facility] Date of Exam: 12/26/2019 Medical Rec #:  [id number]     Height:       64.0 in Accession #:    [id number]    Weight:       141.4 lb Date of Birth:  [birth year]     BSA:          1.688 m Patient Age:     73 years      BP:           107/68 mmHg Patient Gender: M             HR:           96 bpm. Exam Location:  Inpatient Procedure: Limited Echo and Color Doppler Indications:    Chest pain 786.50/R07.9  History:        Patient has prior history of Echocardiogram examinations, most                 recent 12/25/2019. CAD, Prior CABG, Aortic Valve Disease,                 Arrythmias:Atrial Fibrillation; Risk Factors:Diabetes,                 Dyslipidemia and Hypertension. GERD.  Sonographer:    Clayton Lefort RDCS (AE) Referring Phys: [id number] [place]  1. Limited evaluation of pericardial effusion. Trivial effusion is present that is not clinically significant.  2. Left ventricular ejection fraction, by estimation, is 60 to 65%. The left ventricle has normal function. The left ventricle has no regional wall motion abnormalities.  3. Right ventricular systolic function is normal. The right ventricular size is mildly enlarged.  4. The inferior vena cava is dilated in size with <50% respiratory variability, suggesting right atrial pressure of 15 mmHg. FINDINGS  Left Ventricle: Left ventricular ejection fraction, by estimation, is 60 to 65%. The left ventricle has normal function. The left ventricle has no regional wall motion abnormalities. Abnormal (paradoxical) septal motion consistent with post-operative status. Right Ventricle: The right ventricular size is mildly enlarged. No increase in right ventricular wall thickness. Right ventricular systolic function is normal. Pericardium: Trivial pericardial effusion is present. There is no evidence of cardiac tamponade. Aortic Valve: There is severe thickening of the aortic valve. There is severe calcifcation of the aortic valve. Venous: The inferior vena cava is dilated in size with less than 50% respiratory variability, suggesting right atrial pressure of 15 mmHg.  IVC IVC diam: 2.30 cm Eleonore Chiquito MD Electronically signed by Eleonore Chiquito MD Signature  Date/Time: 12/26/2019/11:58:48 AM    Final    ECHOCARDIOGRAM LIMITED  Result Date: 12/24/2019    ECHOCARDIOGRAM LIMITED REPORT   Patient Name:   Garrett Hughes [facility] Date of Exam: 12/24/2019 Medical Rec #:  [id number]     Height:       65.0 in Accession #:    [id number]    Weight:       142.0 lb Date of Birth:  April 19, 1941     BSA:          1.710 m Patient Age:    20 years      BP:           105/63 mmHg Patient Gender: M             HR:  53 bpm. Exam Location:  Inpatient Procedure: Limited Echo Indications:    Pericardial effusion 423.9 / I31.3  History:        Patient has prior history of Echocardiogram examinations, most                 recent 10/19/2019. CAD, Prior CABG, Aortic Valve Disease,                 Arrythmias:Atrial Fibrillation, Signs/Symptoms:Chest Pain; Risk                 Factors:Diabetes, Dyslipidemia and Hypertension. GERD.  Sonographer:    Darlina Sicilian RDCS Referring Phys: Glenetta Hew MD  Sonographer Comments: Echo performed during CATH to check for pericardial effusion IMPRESSIONS  1. Limited echo for pericardial effusion Patient is post atherectomy of RCA with perforation covered with stent. No significant pericardial effusion is seen. FINDINGS  Additional Comments: Limited echo for pericardial effusion Patient is post atherectomy of RCA with perforation covered with stent. No significant pericardial effusion is seen. Jenkins Rouge MD Electronically signed by Jenkins Rouge MD Signature Date/Time: 12/24/2019/2:56:07 PM    Final    CT ANGIO ABDOMEN PELVIS  W &/OR WO CONTRAST  Result Date: 12/08/2019 CLINICAL DATA:  79 year old male with history of severe aortic stenosis. Preprocedural study prior to potential transcatheter aortic valve replacement (TAVR) procedure. EXAM: CT ANGIOGRAPHY CHEST, ABDOMEN AND PELVIS TECHNIQUE: Multidetector CT imaging through the chest, abdomen and pelvis was performed using the standard protocol during bolus administration of intravenous contrast. Multiplanar  reconstructed images and MIPs were obtained and reviewed to evaluate the vascular anatomy. CONTRAST:  151m OMNIPAQUE IOHEXOL 350 MG/ML SOLN COMPARISON:  CT the abdomen and pelvis 02/18/2012. FINDINGS: CTA CHEST FINDINGS Cardiovascular: Heart size is mildly enlarged. There is no significant pericardial fluid, thickening or pericardial calcification. There is aortic atherosclerosis, as well as atherosclerosis of the great vessels of the mediastinum and the coronary arteries, including calcified atherosclerotic plaque in the left main, left anterior descending, left circumflex and right coronary arteries. Status post median sternotomy for CABG including LIMA to the LAD. Severe thickening and calcification of the aortic valve. Calcifications of the mitral annulus and mitral valve. Mediastinum/Lymph Nodes: No pathologically enlarged mediastinal or hilar lymph nodes. Moderate hiatal hernia. No axillary lymphadenopathy. Lungs/Pleura: Widespread areas of mild ground-glass attenuation and interlobular septal thickening noted throughout the lungs bilaterally, favored to reflect a background of mild pulmonary edema. Trace bilateral pleural effusions lying dependently (right greater than left). No confluent consolidative airspace disease. No suspicious appearing pulmonary nodules or masses are noted. Musculoskeletal/Soft Tissues: Median sternotomy wires. There are no aggressive appearing lytic or blastic lesions noted in the visualized portions of the skeleton. Multiple vertebral body compression fractures at the T3, T7 and T8, most severe at T3 where there is 30% loss of anterior vertebral body height. CTA ABDOMEN AND PELVIS FINDINGS Hepatobiliary: Liver has a nodular contour, which may suggest early changes of cirrhosis. No suspicious cystic or solid hepatic lesions. No intra or extrahepatic biliary ductal dilatation. Gallbladder is normal in appearance. Pancreas: Diffuse pancreatic atrophy. No pancreatic mass. No pancreatic  ductal dilatation. No pancreatic or peripancreatic fluid collections or inflammatory changes. Spleen: Unremarkable. Adrenals/Urinary Tract: 1.7 cm low-attenuation lesion in the lower pole the right kidney, compatible with a simple cyst. Left kidney and bilateral adrenal glands are normal in appearance. No hydroureteronephrosis. Urinary bladder is normal in appearance. Stomach/Bowel: Normal appearance of the intra-abdominal portion of the stomach. No pathologic dilatation of small bowel or  colon. Numerous colonic diverticulae are noted, without definite surrounding inflammatory changes to suggest an acute diverticulitis at this time. Normal appendix. Vascular/Lymphatic: Aortic atherosclerosis, with vascular findings and measurements pertinent to potential TAVR procedure, as detailed below. No aneurysm or dissection noted in the abdominal or pelvic vasculature. No lymphadenopathy noted in the abdomen or pelvis. Reproductive: Prostate gland and seminal vesicles are unremarkable in appearance. Other: Trace volume of ascites noted in the visualized portions of the peritoneal cavity. No pneumoperitoneum. Musculoskeletal: Compression fractures are noted at L1 and L4, with post vertebroplasty changes at L4. There are no aggressive appearing lytic or blastic lesions noted in the visualized portions of the skeleton. VASCULAR MEASUREMENTS PERTINENT TO TAVR: AORTA: Minimal Aortic Diameter-14 x 11 mm Severity of Aortic Calcification-severe RIGHT PELVIS: Right Common Iliac Artery - Minimal Diameter-6.1 x 4.2 mm Tortuosity-mild-to-moderate Calcification-severe Right External Iliac Artery - Minimal Diameter-7.7 x 2.9 mm Tortuosity-moderate Calcification-moderate Right Common Femoral Artery - Minimal Diameter-6.7 x 1.7 mm Tortuosity-mild Calcification-moderate LEFT PELVIS: Left Common Iliac Artery - Minimal Diameter-3.7 x 2.1 mm Tortuosity-mild-to-moderate Calcification-severe Left External Iliac Artery - Minimal Diameter-5.7 x 7.2  mm Tortuosity-moderate Calcification-mild-to-moderate Left Common Femoral Artery - Minimal Diameter-7.7 x 3.2 mm Tortuosity-mild Calcification-moderate Review of the MIP images confirms the above findings. IMPRESSION: 1. Vascular findings and measurements pertinent to potential TAVR procedure, as detailed above. 2. Severe thickening calcification of the aortic valve, compatible with reported clinical history of severe aortic stenosis. 3. Aortic atherosclerosis, in addition to left main and 3 vessel coronary artery disease. Status post median sternotomy for CABG including LIMA to the LAD. 4. Mild cardiomegaly with findings suggestive of mild congestive heart failure, as above. 5. The appearance of the liver suggests underlying cirrhosis. 6. Diffuse pancreatic atrophy. 7. Severe colonic diverticulosis without evidence of acute diverticulitis at this time. 8. Additional incidental findings, as above. Electronically Signed   By: Vinnie Langton M.D.   On: 12/08/2019 14:09   Disposition   Pt is being discharged home today in good condition.  Follow-up Plans & Appointments    Follow-up Information    Lelon Perla, MD Follow up.   Specialty: Cardiology Why: Our office will call you for a follow-up appointment. Please call the office if you have not heard from [place] within 3 days. Contact information: [address] [place] [address] [phone]          Discharge Instructions    Amb Referral to Cardiac Rehabilitation   Complete by: As directed    Diagnosis: Coronary Stents   After initial evaluation and assessments completed: Virtual Based Care may be provided alone or in conjunction with Phase 2 Cardiac Rehab based on patient barriers.: Yes   Diet - low sodium heart healthy   Complete by: As directed    Discharge instructions   Complete by: As directed    You may restart your Metformin TOMORROW 12/27/19. Do not take any today.  Dr. Percival Spanish would like you to take a lower  dose of your lisinopril and your furosemide (Lasix). Since you already have these tablets at home, you can go ahead and cut each of them in half to take the doses as listed. When you are seen in follow-up, please discuss whether these new lower doses are permanent - if so, our office can send in an updated prescription to your pharmacy.   Increase activity slowly   Complete by: As directed    No driving until cleared by your cardiology team. No lifting over 5  lbs for 1 week. No sexual activity for 1 week. Keep procedure site clean & dry. If you notice increased pain, swelling, bleeding or pus, call/return!  You may shower, but no soaking baths/hot tubs/pools for 1 week.      Discharge Medications   Allergies as of 12/26/2019      Reactions   Atorvastatin    Other reaction(s): Myalgias (intolerance)   Cholestyramine Other (See Comments)   Constipation   Pravastatin Other (See Comments)   Patient currently taking  ????12/09/19      Medication List    TAKE these medications   Accu-Chek Aviva Plus test strip Generic drug: glucose blood Use as instructed to check blood sugar up to qid. Dx Code E11.22. Disp generic per pt preference / insurance coverage   Accu-Chek Aviva Plus w/Device Kit Use as instructed to check blood sugar up to qid. Dx code E11.22   Accu-Chek Aviva Soln Use as instructed per manufacturer directions. Dx code E11.22   Alcohol Swabs Pads Use as instructed to check blood sugar up to qid. Dx code E11.22   AMBULATORY NON FORMULARY MEDICATION Medication Name: accu-chek aviva plus meter, accu-check aviva plus test strips & accu-chek softclix lancets to test twice a day. Dx: E11.29   aspirin 81 MG chewable tablet Chew 81 mg by mouth daily.   clopidogrel 75 MG tablet Commonly known as: PLAVIX TAKE 1 TABLET BY MOUTH  DAILY   doxazosin 8 MG tablet Commonly known as: CARDURA TAKE 1 TABLET BY MOUTH  DAILY   ferrous sulfate 325 (65 FE) MG EC tablet Take 1 tablet (325  mg total) by mouth daily with breakfast.   furosemide 40 MG tablet Commonly known as: LASIX Take 0.5 tablets (20 mg total) by mouth daily. What changed:   how much to take  how to take this  when to take this  additional instructions   glipiZIDE 5 MG tablet Commonly known as: GLUCOTROL TAKE 1 TABLET BY MOUTH  TWICE A DAY BEFORE MEALS What changed:   how much to take  how to take this  when to take this  additional instructions   lisinopril 5 MG tablet Commonly known as: ZESTRIL Take 0.5 tablets (2.5 mg total) by mouth daily. What changed: how much to take   metFORMIN 1000 MG tablet Commonly known as: GLUCOPHAGE TAKE 1 TABLET BY MOUTH  TWICE DAILY WITH A MEAL What changed:   how much to take  how to take this  when to take this  additional instructions Notes to patient: You may restart your Metformin TOMORROW 12/27/19.   metoprolol succinate 25 MG 24 hr tablet Commonly known as: TOPROL-XL TAKE 1 TABLET BY MOUTH  DAILY   MULTI-VITAMIN DAILY PO Take 1 tablet by mouth daily.   nitroGLYCERIN 0.4 MG SL tablet Commonly known as: NITROSTAT Place 1 tablet (0.4 mg total) under the tongue every 5 (five) minutes as needed for chest pain.   onetouch ultrasoft lancets Use as instructed to check blood sugar up to qid. Dx: E11.22   Accu-Chek Softclix Lancets lancets Use as instructed to check blood sugar up to qid. Dx Code E11.22.   pantoprazole 20 MG tablet Commonly known as: PROTONIX TAKE 1 TABLET BY MOUTH  DAILY   pravastatin 40 MG tablet Commonly known as: PRAVACHOL TAKE 1 TABLET BY MOUTH  DAILY          Outstanding Labs/Studies   N/a  Duration of Discharge Encounter   Greater than 30 minutes including physician time.  Signed, Charlie Pitter, PA-C 12/26/2019, 2:24 PM

## 2019-12-27 ENCOUNTER — Encounter: Payer: Medicare Other | Admitting: Thoracic Surgery (Cardiothoracic Vascular Surgery)

## 2019-12-27 ENCOUNTER — Telehealth: Payer: Self-pay | Admitting: Cardiology

## 2019-12-27 MED FILL — Midazolam HCl Inj 2 MG/2ML (Base Equivalent): INTRAMUSCULAR | Qty: 2 | Status: AC

## 2019-12-27 MED FILL — Fentanyl Citrate Preservative Free (PF) Inj 100 MCG/2ML: INTRAMUSCULAR | Qty: 2 | Status: AC

## 2019-12-27 NOTE — Telephone Encounter (Signed)
-----   Message from Laurann [place], [place] sent at 12/26/2019  1:41 PM EDT ----- Regarding: Needs close f/u Please schedule this patient for a follow-up appointment and call them with that information.  Primary Cardiologist: Jens Som Date of Discharge: 12/26/2019 Appointment Needed Within: this upcoming week per Dr. Antoine Poche - he said to see if Dr. Jens Som can see in [place] as a TOC and if not, somebody in the [place] office this upcoming week Appointment Type: TOC  Thank you! Dayna Dunn PA-C

## 2019-12-27 NOTE — Telephone Encounter (Signed)
Patient is scheduled for [facility] appt with Dayton Scrape on 4/26, requested by Ronie Spies.

## 2019-12-28 ENCOUNTER — Emergency Department (HOSPITAL_COMMUNITY): Payer: Medicare Other

## 2019-12-28 ENCOUNTER — Other Ambulatory Visit: Payer: Self-pay

## 2019-12-28 ENCOUNTER — Encounter (HOSPITAL_COMMUNITY): Payer: Self-pay

## 2019-12-28 ENCOUNTER — Inpatient Hospital Stay (HOSPITAL_COMMUNITY)
Admission: EM | Admit: 2019-12-28 | Discharge: 2019-12-30 | DRG: 292 | Disposition: A | Discharge status: Home / Self Care | Payer: Medicare Other | Attending: Cardiovascular Disease | Admitting: Cardiovascular Disease

## 2019-12-28 DIAGNOSIS — R55 Syncope and collapse: Secondary | ICD-10-CM | POA: Diagnosis not present

## 2019-12-28 DIAGNOSIS — S50311A Abrasion of right elbow, initial encounter: Secondary | ICD-10-CM | POA: Diagnosis not present

## 2019-12-28 DIAGNOSIS — Z79899 Other long term (current) drug therapy: Secondary | ICD-10-CM

## 2019-12-28 DIAGNOSIS — Z951 Presence of aortocoronary bypass graft: Secondary | ICD-10-CM

## 2019-12-28 DIAGNOSIS — I110 Hypertensive heart disease with heart failure: Secondary | ICD-10-CM | POA: Diagnosis not present

## 2019-12-28 DIAGNOSIS — Z955 Presence of coronary angioplasty implant and graft: Secondary | ICD-10-CM | POA: Diagnosis not present

## 2019-12-28 DIAGNOSIS — E785 Hyperlipidemia, unspecified: Secondary | ICD-10-CM | POA: Diagnosis present

## 2019-12-28 DIAGNOSIS — E119 Type 2 diabetes mellitus without complications: Secondary | ICD-10-CM

## 2019-12-28 DIAGNOSIS — Z20822 Contact with and (suspected) exposure to covid-19: Secondary | ICD-10-CM | POA: Diagnosis not present

## 2019-12-28 DIAGNOSIS — Z87891 Personal history of nicotine dependence: Secondary | ICD-10-CM

## 2019-12-28 DIAGNOSIS — I4821 Permanent atrial fibrillation: Secondary | ICD-10-CM | POA: Diagnosis not present

## 2019-12-28 DIAGNOSIS — Z8249 Family history of ischemic heart disease and other diseases of the circulatory system: Secondary | ICD-10-CM | POA: Diagnosis not present

## 2019-12-28 DIAGNOSIS — Z7982 Long term (current) use of aspirin: Secondary | ICD-10-CM

## 2019-12-28 DIAGNOSIS — W1830XA Fall on same level, unspecified, initial encounter: Secondary | ICD-10-CM | POA: Diagnosis present

## 2019-12-28 DIAGNOSIS — I5033 Acute on chronic diastolic (congestive) heart failure: Secondary | ICD-10-CM | POA: Diagnosis not present

## 2019-12-28 DIAGNOSIS — I083 Combined rheumatic disorders of mitral, aortic and tricuspid valves: Secondary | ICD-10-CM | POA: Diagnosis present

## 2019-12-28 DIAGNOSIS — I25119 Atherosclerotic heart disease of native coronary artery with unspecified angina pectoris: Secondary | ICD-10-CM | POA: Diagnosis not present

## 2019-12-28 DIAGNOSIS — K219 Gastro-esophageal reflux disease without esophagitis: Secondary | ICD-10-CM | POA: Diagnosis present

## 2019-12-28 DIAGNOSIS — J90 Pleural effusion, not elsewhere classified: Secondary | ICD-10-CM | POA: Diagnosis not present

## 2019-12-28 DIAGNOSIS — I452 Bifascicular block: Secondary | ICD-10-CM | POA: Diagnosis present

## 2019-12-28 DIAGNOSIS — I2510 Atherosclerotic heart disease of native coronary artery without angina pectoris: Secondary | ICD-10-CM | POA: Diagnosis present

## 2019-12-28 DIAGNOSIS — D638 Anemia in other chronic diseases classified elsewhere: Secondary | ICD-10-CM | POA: Diagnosis not present

## 2019-12-28 DIAGNOSIS — Z7902 Long term (current) use of antithrombotics/antiplatelets: Secondary | ICD-10-CM | POA: Diagnosis not present

## 2019-12-28 DIAGNOSIS — S299XXA Unspecified injury of thorax, initial encounter: Secondary | ICD-10-CM | POA: Diagnosis not present

## 2019-12-28 DIAGNOSIS — R079 Chest pain, unspecified: Secondary | ICD-10-CM | POA: Diagnosis not present

## 2019-12-28 DIAGNOSIS — I509 Heart failure, unspecified: Secondary | ICD-10-CM

## 2019-12-28 DIAGNOSIS — R0902 Hypoxemia: Secondary | ICD-10-CM | POA: Diagnosis present

## 2019-12-28 DIAGNOSIS — I4891 Unspecified atrial fibrillation: Secondary | ICD-10-CM | POA: Diagnosis present

## 2019-12-28 DIAGNOSIS — Z743 Need for continuous supervision: Secondary | ICD-10-CM | POA: Diagnosis not present

## 2019-12-28 DIAGNOSIS — J810 Acute pulmonary edema: Secondary | ICD-10-CM | POA: Diagnosis not present

## 2019-12-28 DIAGNOSIS — I350 Nonrheumatic aortic (valve) stenosis: Secondary | ICD-10-CM

## 2019-12-28 DIAGNOSIS — I10 Essential (primary) hypertension: Secondary | ICD-10-CM | POA: Diagnosis present

## 2019-12-28 DIAGNOSIS — Z888 Allergy status to other drugs, medicaments and biological substances status: Secondary | ICD-10-CM | POA: Diagnosis not present

## 2019-12-28 DIAGNOSIS — Z7984 Long term (current) use of oral hypoglycemic drugs: Secondary | ICD-10-CM | POA: Diagnosis not present

## 2019-12-28 DIAGNOSIS — I482 Chronic atrial fibrillation, unspecified: Secondary | ICD-10-CM | POA: Diagnosis present

## 2019-12-28 DIAGNOSIS — R0602 Shortness of breath: Secondary | ICD-10-CM | POA: Diagnosis not present

## 2019-12-28 DIAGNOSIS — S79921A Unspecified injury of right thigh, initial encounter: Secondary | ICD-10-CM | POA: Diagnosis not present

## 2019-12-28 DIAGNOSIS — R58 Hemorrhage, not elsewhere classified: Secondary | ICD-10-CM | POA: Diagnosis not present

## 2019-12-28 LAB — COMPREHENSIVE METABOLIC PANEL
ALT: 42 U/L (ref 0–44)
AST: 49 U/L — ABNORMAL HIGH (ref 15–41)
Albumin: 2.7 g/dL — ABNORMAL LOW (ref 3.5–5.0)
Alkaline Phosphatase: 115 U/L (ref 38–126)
Anion gap: 11 (ref 5–15)
BUN: 26 mg/dL — ABNORMAL HIGH (ref 8–23)
CO2: 21 mmol/L — ABNORMAL LOW (ref 22–32)
Calcium: 8.6 mg/dL — ABNORMAL LOW (ref 8.9–10.3)
Chloride: 106 mmol/L (ref 98–111)
Creatinine, Ser: 1.31 mg/dL — ABNORMAL HIGH (ref 0.61–1.24)
GFR calc Af Amer: 60 mL/min — ABNORMAL LOW (ref >60)
GFR calc non Af Amer: 51 mL/min — ABNORMAL LOW (ref >60)
Glucose, Bld: 210 mg/dL — ABNORMAL HIGH (ref 70–99)
Potassium: 4.7 mmol/L (ref 3.5–5.1)
Sodium: 138 mmol/L (ref 135–145)
Total Bilirubin: 0.8 mg/dL (ref 0.3–1.2)
Total Protein: 5.9 g/dL — ABNORMAL LOW (ref 6.5–8.1)

## 2019-12-28 LAB — BRAIN NATRIURETIC PEPTIDE: B Natriuretic Peptide: 165.1 pg/mL — ABNORMAL HIGH (ref 0.0–100.0)

## 2019-12-28 LAB — CBC WITH DIFFERENTIAL/PLATELET
Abs Immature Granulocytes: 0.05 10*3/uL (ref 0.00–0.07)
Basophils Absolute: 0 10*3/uL (ref 0.0–0.1)
Basophils Relative: 0 %
Eosinophils Absolute: 0 10*3/uL (ref 0.0–0.5)
Eosinophils Relative: 0 %
HCT: 27.3 % — ABNORMAL LOW (ref 39.0–52.0)
Hemoglobin: 8.7 g/dL — ABNORMAL LOW (ref 13.0–17.0)
Immature Granulocytes: 1 %
Lymphocytes Relative: 5 %
Lymphs Abs: 0.4 10*3/uL — ABNORMAL LOW (ref 0.7–4.0)
MCH: 33 pg (ref 26.0–34.0)
MCHC: 31.9 g/dL (ref 30.0–36.0)
MCV: 103.4 fL — ABNORMAL HIGH (ref 80.0–100.0)
Monocytes Absolute: 1 10*3/uL (ref 0.1–1.0)
Monocytes Relative: 12 %
Neutro Abs: 6.9 10*3/uL (ref 1.7–7.7)
Neutrophils Relative %: 82 %
Platelets: 112 10*3/uL — ABNORMAL LOW (ref 150–400)
RBC: 2.64 MIL/uL — ABNORMAL LOW (ref 4.22–5.81)
RDW: 14.5 % (ref 11.5–15.5)
WBC: 8.4 10*3/uL (ref 4.0–10.5)
nRBC: 0 % (ref 0.0–0.2)

## 2019-12-28 LAB — URINALYSIS, ROUTINE W REFLEX MICROSCOPIC
Bilirubin Urine: NEGATIVE
Glucose, UA: NEGATIVE mg/dL
Hgb urine dipstick: NEGATIVE
Ketones, ur: NEGATIVE mg/dL
Leukocytes,Ua: NEGATIVE
Nitrite: NEGATIVE
Protein, ur: NEGATIVE mg/dL
Specific Gravity, Urine: 1.017 (ref 1.005–1.030)
pH: 5 (ref 5.0–8.0)

## 2019-12-28 LAB — TROPONIN I (HIGH SENSITIVITY)
Troponin I (High Sensitivity): 1590 ng/L (ref <18)
Troponin I (High Sensitivity): 1542 ng/L (ref <18)
Troponin I (High Sensitivity): 1176 ng/L (ref <18)
Troponin I (High Sensitivity): 1013 ng/L (ref <18)

## 2019-12-28 LAB — MAGNESIUM: Magnesium: 1.7 mg/dL (ref 1.7–2.4)

## 2019-12-28 LAB — D-DIMER, QUANTITATIVE: D-Dimer, Quant: 3.67 ug/mL-FEU — ABNORMAL HIGH (ref 0.00–0.50)

## 2019-12-28 LAB — GLUCOSE, CAPILLARY: Glucose-Capillary: 225 mg/dL — ABNORMAL HIGH (ref 70–99)

## 2019-12-28 LAB — TSH: TSH: 3.429 u[IU]/mL (ref 0.350–4.500)

## 2019-12-28 LAB — SARS CORONAVIRUS 2 (TAT 6-24 HRS): SARS Coronavirus 2: NEGATIVE

## 2019-12-28 MED ORDER — METOPROLOL SUCCINATE ER 25 MG PO TB24
25.0000 mg | ORAL_TABLET | Freq: Every day | ORAL | Status: DC
  Administered 2019-12-28 – 2019-12-30 (×3): 25 mg via ORAL
  Filled 2019-12-28 (×3): qty 1

## 2019-12-28 MED ORDER — INSULIN ASPART 100 UNIT/ML ~~LOC~~ SOLN
0.0000 [IU] | Freq: Three times a day (TID) | SUBCUTANEOUS | Status: DC
  Administered 2019-12-29 (×3): 2 [IU] via SUBCUTANEOUS
  Administered 2019-12-30: 1 [IU] via SUBCUTANEOUS
  Administered 2019-12-30: 5 [IU] via SUBCUTANEOUS

## 2019-12-28 MED ORDER — ASPIRIN 81 MG PO CHEW
324.0000 mg | CHEWABLE_TABLET | Freq: Once | ORAL | Status: AC
  Administered 2019-12-28: 324 mg via ORAL
  Filled 2019-12-28: qty 4

## 2019-12-28 MED ORDER — ENOXAPARIN SODIUM 40 MG/0.4ML ~~LOC~~ SOLN
40.0000 mg | SUBCUTANEOUS | Status: DC
  Administered 2019-12-28 – 2019-12-29 (×2): 40 mg via SUBCUTANEOUS
  Filled 2019-12-28 (×2): qty 0.4

## 2019-12-28 MED ORDER — GLIPIZIDE 5 MG PO TABS
5.0000 mg | ORAL_TABLET | Freq: Two times a day (BID) | ORAL | Status: DC
  Administered 2019-12-29 – 2019-12-30 (×3): 5 mg via ORAL
  Filled 2019-12-28 (×6): qty 1

## 2019-12-28 MED ORDER — PANTOPRAZOLE SODIUM 20 MG PO TBEC
20.0000 mg | DELAYED_RELEASE_TABLET | Freq: Every day | ORAL | Status: DC
  Administered 2019-12-29 – 2019-12-30 (×2): 20 mg via ORAL
  Filled 2019-12-28 (×3): qty 1

## 2019-12-28 MED ORDER — ASPIRIN 81 MG PO CHEW
81.0000 mg | CHEWABLE_TABLET | Freq: Every day | ORAL | Status: DC
  Administered 2019-12-28 – 2019-12-30 (×3): 81 mg via ORAL
  Filled 2019-12-28 (×3): qty 1

## 2019-12-28 MED ORDER — CLOPIDOGREL BISULFATE 75 MG PO TABS
75.0000 mg | ORAL_TABLET | Freq: Every day | ORAL | Status: DC
  Administered 2019-12-28 – 2019-12-30 (×3): 75 mg via ORAL
  Filled 2019-12-28 (×3): qty 1

## 2019-12-28 MED ORDER — SODIUM CHLORIDE 0.9 % IV SOLN: 250.0000 mL | INTRAVENOUS | Status: DC | PRN

## 2019-12-28 MED ORDER — INSULIN ASPART 100 UNIT/ML ~~LOC~~ SOLN
0.0000 [IU] | Freq: Every day | SUBCUTANEOUS | Status: DC
  Administered 2019-12-28: 22:00:00 2 [IU] via SUBCUTANEOUS

## 2019-12-28 MED ORDER — ALPRAZOLAM 0.25 MG PO TABS: 0.2500 mg | ORAL_TABLET | Freq: Two times a day (BID) | ORAL | Status: DC | PRN

## 2019-12-28 MED ORDER — IOHEXOL 350 MG/ML SOLN
75.0000 mL | Freq: Once | INTRAVENOUS | Status: AC | PRN
  Administered 2019-12-28: 05:00:00 75 mL via INTRAVENOUS

## 2019-12-28 MED ORDER — ZOLPIDEM TARTRATE 5 MG PO TABS: 5.0000 mg | ORAL_TABLET | Freq: Every evening | ORAL | Status: DC | PRN

## 2019-12-28 MED ORDER — SODIUM CHLORIDE 0.9% FLUSH: 3.0000 mL | INTRAVENOUS | Status: DC | PRN

## 2019-12-28 MED ORDER — ACETAMINOPHEN 325 MG PO TABS: 650.0000 mg | ORAL_TABLET | ORAL | Status: DC | PRN

## 2019-12-28 MED ORDER — PRAVASTATIN SODIUM 40 MG PO TABS
40.0000 mg | ORAL_TABLET | Freq: Every day | ORAL | Status: DC
  Administered 2019-12-28 – 2019-12-30 (×3): 40 mg via ORAL
  Filled 2019-12-28 (×3): qty 1

## 2019-12-28 MED ORDER — ADULT MULTIVITAMIN W/MINERALS CH
1.0000 | ORAL_TABLET | Freq: Every day | ORAL | Status: DC
  Administered 2019-12-28 – 2019-12-30 (×3): 1 via ORAL
  Filled 2019-12-28 (×3): qty 1

## 2019-12-28 MED ORDER — ONDANSETRON HCL 4 MG/2ML IJ SOLN: 4.0000 mg | Freq: Four times a day (QID) | INTRAMUSCULAR | Status: DC | PRN

## 2019-12-28 MED ORDER — FERROUS SULFATE 325 (65 FE) MG PO TABS
325.0000 mg | ORAL_TABLET | Freq: Every day | ORAL | Status: DC
  Administered 2019-12-29 – 2019-12-30 (×2): 325 mg via ORAL
  Filled 2019-12-28 (×2): qty 1

## 2019-12-28 MED ORDER — FUROSEMIDE 10 MG/ML IJ SOLN
40.0000 mg | Freq: Two times a day (BID) | INTRAMUSCULAR | Status: DC
  Administered 2019-12-28 – 2019-12-29 (×2): 40 mg via INTRAVENOUS
  Filled 2019-12-28 (×2): qty 4

## 2019-12-28 MED ORDER — NITROGLYCERIN 0.4 MG SL SUBL: 0.4000 mg | SUBLINGUAL_TABLET | SUBLINGUAL | Status: DC | PRN

## 2019-12-28 MED ORDER — SODIUM CHLORIDE 0.9% FLUSH
3.0000 mL | Freq: Two times a day (BID) | INTRAVENOUS | Status: DC
  Administered 2019-12-28 – 2019-12-29 (×3): 3 mL via INTRAVENOUS

## 2019-12-28 MED ORDER — FUROSEMIDE 10 MG/ML IJ SOLN
40.0000 mg | Freq: Once | INTRAMUSCULAR | Status: AC
  Administered 2019-12-28: 40 mg via INTRAVENOUS
  Filled 2019-12-28: qty 4

## 2019-12-28 MED ORDER — SODIUM CHLORIDE 0.9 % IV BOLUS (SEPSIS): 500.0000 mL | Freq: Once | INTRAVENOUS | Status: DC

## 2019-12-28 NOTE — ED Notes (Signed)
Pt transported to XRAY at this time.

## 2019-12-28 NOTE — ED Notes (Signed)
Dinner Tray Ordered @ [phone].

## 2019-12-28 NOTE — ED Notes (Signed)
Upon Dr. Barney Drain assessment, pt began to desaturate approx 87%-89% on RA; NAD but pt was placed on [facility] and is now oxygenating 94% or more.

## 2019-12-28 NOTE — ED Notes (Signed)
Attempted report, st's will call back in 10 mins

## 2019-12-28 NOTE — ED Notes (Signed)
Pt taken to CT.

## 2019-12-28 NOTE — H&P (Addendum)
Cardiology History and Physical:   Patient ID: Garrett Hughes; [id number]; 04-18-1941   Admit date: 12/28/2019 Date of Consult: 12/28/2019  Primary Care Provider: Emeterio Reeve, DO Primary Cardiologist: Kirk Ruths, MD 10/20/2019 Primary Electrophysiologist:  None  Chief Complaint: Syncope  Patient Profile:   Garrett Hughes is a 79 y.o. male with a hx of CABG 1997, chronic atrial fibrillation (pt declines anticoagulation), aortic stenosis, carotid artery disease, HTN, DM, HLD (intolerant of higher dose of pravastatin or atorvastatin), chronic appearing anemia/thrombocytopenia, GERD, Barrett's esophagus, h/o GIB and urinary tract bleeding, who is being seen today for the evaluation of CHF, syncope, at the request of Dr Leonides Schanz.  History of Present Illness:   Garrett Hughes was admitted 04/16-04/18/2021 for scheduled R/L cath. Cath to be done for increased DOE, to eval AS. Also recently found to have sig carotid dz.   Cath w/ RCA dz, results below. After thorough eval, decision made to do orbital atherectomy and DES x 3. On ASA, Plavix. Lasix and lisinopril decreased for soft BPs to 20 mg and 2.5 mg qd respectively.   Garrett Hughes is clear that he took his medication exactly like the AVS instructions.   He was fine yesterday. Admits that he probably uses more salt than he should, but still doesn't use very much. No LE edema, no orthopnea or PND.   Has DOE, this is chronic and does not bother him much unless he is doing something more strenuous. No recent change.   Today, he got up to go to the bathroom. Denies dizziness, being lightheaded. Woke up on the floor. He had been incontinent of bowel and bladder, assumes he never made it to the BR.   Nothing like this has ever happened before. No hx orthostatic dizziness, no hx presyncope.    Past Medical History:  Diagnosis Date  . Anemia   . Atherosclerosis of native coronary artery of native heart without angina pectoris 01/05/2016  . Benign  essential hypertension 01/05/2016  . Carotid artery disease ([place])   . Cataract, nuclear, right 05/06/2014  . Chronic atrial fibrillation ([place]) 12/20/2015  . Current use of long term anticoagulation 12/20/2015  . Diabetic kidney disease ([place]) 01/05/2016  . GERD (gastroesophageal reflux disease)   . GI bleed   . History of Barrett's esophagus 12/20/2015  . History of blood in urine   . 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 Hyperlipemia 10/26/2015  . Low back pain 05/02/2016  . Severe aortic stenosis   . Thrombocytopenia ([place])   . Type 2 diabetes mellitus with kidney complication, without long-term current use of insulin ([place]) 07/03/2016    Past Surgical History:  Procedure Laterality Date  . BACK SURGERY  2004  . [place]  . CORONARY ATHERECTOMY N/A 12/24/2019   Procedure: CORONARY ATHERECTOMY;  Surgeon: Sherren Mocha, MD;  Location: [facility];  Service: Cardiovascular;  Laterality: N/A;  . RIGHT HEART CATH AND CORONARY/GRAFT ANGIOGRAPHY N/A 11/26/2019   Procedure: RIGHT HEART CATH AND CORONARY/GRAFT ANGIOGRAPHY;  Surgeon: Sherren Mocha, MD;  Location: [facility];  Service: Cardiovascular;  Laterality: N/A;  . SHOULDER SURGERY  2005     Prior to Admission medications   Medication Sig Start Date End Date Taking? Authorizing Provider  aspirin 81 MG chewable tablet Chew 81 mg by mouth daily.    Yes [provider]  clopidogrel (PLAVIX) 75 MG tablet TAKE 1 TABLET BY  MOUTH  DAILY Patient taking differently: Take 75 mg by mouth daily.  04/07/19  Yes Emeterio Reeve, DO  doxazosin (CARDURA) 8 MG tablet TAKE 1 TABLET BY MOUTH  DAILY Patient taking differently: Take 8 mg by mouth daily.  07/30/19  Yes Emeterio Reeve, DO  ferrous sulfate 325 (65 FE) MG EC tablet Take 1 tablet (325 mg total) by mouth daily with breakfast. 09/14/18  Yes Emeterio Reeve, DO  furosemide (LASIX) 40 MG tablet Take 0.5 tablets (20 mg  total) by mouth daily. 12/26/19  Yes Dunn, Dayna N, PA-C  glipiZIDE (GLUCOTROL) 5 MG tablet TAKE 1 TABLET BY MOUTH  TWICE A DAY BEFORE MEALS Patient taking differently: Take 5 mg by mouth 2 (two) times daily before a meal.  07/30/19  Yes Emeterio Reeve, DO  lisinopril (ZESTRIL) 5 MG tablet Take 0.5 tablets (2.5 mg total) by mouth daily. 12/26/19  Yes Dunn, Dayna N, PA-C  metFORMIN (GLUCOPHAGE) 1000 MG tablet TAKE 1 TABLET BY MOUTH  TWICE DAILY WITH A MEAL Patient taking differently: Take 1,000 mg by mouth 2 (two) times daily with a meal.  07/30/19  Yes Emeterio Reeve, DO  metoprolol succinate (TOPROL-XL) 25 MG 24 hr tablet TAKE 1 TABLET BY MOUTH  DAILY Patient taking differently: Take 25 mg by mouth daily.  07/30/19  Yes Emeterio Reeve, DO  Multiple Vitamin (MULTIVITAMIN WITH MINERALS) TABS tablet Take 1 tablet by mouth daily.   Yes [provider]  nitroGLYCERIN (NITROSTAT) 0.4 MG SL tablet Place 1 tablet (0.4 mg total) under the tongue every 5 (five) minutes as needed for chest pain. 09/14/18  Yes Emeterio Reeve, DO  pantoprazole (PROTONIX) 20 MG tablet TAKE 1 TABLET BY MOUTH  DAILY Patient taking differently: Take 20 mg by mouth daily.  07/30/19  Yes Emeterio Reeve, DO  pravastatin (PRAVACHOL) 40 MG tablet TAKE 1 TABLET BY MOUTH  DAILY Patient taking differently: Take 40 mg by mouth daily.  07/30/19  Yes Emeterio Reeve, DO  ACCU-CHEK SOFTCLIX LANCETS lancets Use as instructed to check blood sugar up to qid. Dx Code E11.22. 06/02/18   Hali Marry, MD  Alcohol Swabs PADS Use as instructed to check blood sugar up to qid. Dx code E11.22 06/02/18   Emeterio Reeve, DO  AMBULATORY NON FORMULARY MEDICATION Medication Name: accu-chek aviva plus meter, accu-check aviva plus test strips & accu-chek softclix lancets to test twice a day. Dx: E11.29 06/03/18   Hali Marry, MD  Blood Glucose Calibration (ACCU-CHEK AVIVA) SOLN Use as instructed per manufacturer  directions. Dx code E11.22 06/02/18   Emeterio Reeve, DO  Blood Glucose Monitoring Suppl (ACCU-CHEK AVIVA PLUS) w/Device KIT Use as instructed to check blood sugar up to qid. Dx code E11.22 06/02/18   Hali Marry, MD  glucose blood (ACCU-CHEK AVIVA PLUS) test strip Use as instructed to check blood sugar up to qid. Dx Code E11.22. Disp generic per pt preference / insurance coverage 06/15/19   Emeterio Reeve, DO  Lancets [facility] ULTRASOFT) lancets Use as instructed to check blood sugar up to qid. Dx: E11.22 11/06/17   Emeterio Reeve, DO    Inpatient Medications: Scheduled Meds:  Continuous Infusions:  PRN Meds:   Allergies:    Allergies  Allergen Reactions  . Atorvastatin     Other reaction(s): Myalgias (intolerance)  . Cholestyramine Other (See Comments)    Constipation  . Pravastatin Other (See Comments)    Does not tolerate high doses    Social History:   Social History  Socioeconomic History  . Marital status: Married    Spouse name: Edd Fabian  . Number of children: 2  . Years of education: 34  . Highest education level: 11th grade  Occupational History  . Occupation: truck Geophysicist/field seismologist    Comment: retired  Tobacco Use  . Smoking status: Former Smoker    Quit date: 09/10/1987    Years since quitting: 32.3  . Smokeless tobacco: Never Used  Substance and Sexual Activity  . Alcohol use: No  . Drug use: No  . Sexual activity: Not Currently  Other Topics Concern  . 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 Keeps grandsons and granddaughter   Social Determinants of Health   Financial Resource Strain:   . Difficulty of Paying Living Expenses:   Food Insecurity:   . Worried About Charity fundraiser in the Last Year:   . Arboriculturist in the Last Year:   Transportation Needs:   . Film/video editor (Medical):   Marland Kitchen Lack of Transportation (Non-Medical):     Physical Activity:   . Days of Exercise per Week:   . Minutes of Exercise per Session:   Stress:   . Feeling of Stress :   Social Connections:   . Frequency of Communication with Friends and Family:   . Frequency of Social Gatherings with Friends and Family:   . Attends Religious Services:   . Active Member of Clubs or Organizations:   . Attends Archivist Meetings:   Marland Kitchen Marital Status:   Intimate Partner Violence:   . Fear of Current or Ex-Partner:   . Emotionally Abused:   Marland Kitchen Physically Abused:   . Sexually Abused:     Family History:   Family History  Problem Relation Age of Onset  . CAD Father   . Colon cancer Neg Hx   . Heart attack Neg Hx    Family Status:  Family Status  Relation Name Status  . Mother  Deceased  . Father  Deceased  . MGM  Deceased  . MGF  Deceased  . PGM  Deceased  . PGF  Deceased  . Neg Hx  (Not Specified)    ROS:  Please see the history of present illness.  All other ROS reviewed and negative.     Physical Exam/Data:   Vitals:   12/28/19 0945 12/28/19 1000 12/28/19 1015 12/28/19 1030  BP: 115/75 134/62 119/71 (!) 113/59  Pulse: (!) 41 74 (!) 53 66  Resp: 19 (!) 22 (!) 22 (!) 24  Temp:      TempSrc:      SpO2: 96% 94% (!) 72% 95%  Weight:      Height:        Intake/Output Summary (Last 24 hours) at 12/28/2019 1140 Last data filed at 12/28/2019 0806 Gross per 24 hour  Intake --  Output 325 ml  Net -325 ml    Last 3 Weights 12/28/2019 12/26/2019 12/25/2019  Weight (lbs) 141 lb 5 oz 141 lb 6.4 oz 145 lb 3.2 oz  Weight (kg) 64.1 kg 64.139 kg 65.862 kg     Body mass index is 24.26 kg/m.   General:  Well nourished, well developed, male in no acute distress HEENT: normal for age, poor dentition Lymph: no adenopathy Neck: JVD - 12 cm Endocrine:  No thryomegaly Vascular: No carotid bruits; 4/4 extremity pulses 2+  Cardiac:  normal S1, S2; Irreg R&R; typical AS murmur Lungs:  Rales bases bilaterally, no wheezing, rhonchi   Abd: soft, nontender, no hepatomegaly  Ext: no edema Musculoskeletal:  No deformities, BUE and BLE strength normal and equal Skin: warm and dry  Neuro:  CNs 2-12 intact, no focal abnormalities noted Psych:  Normal affect   EKG:  The EKG was personally reviewed and demonstrates:  04/20 ECG is atrial fib, HR 80, RBBB and LAFB are old  Telemetry:  Telemetry was personally reviewed and demonstrates:  Atrial fib, HR variable, no pauses > 3 sec   CV studies:   ECHO: 12/26/2019 LIMITED 1. Limited evaluation of pericardial effusion. Trivial effusion is  present that is not clinically significant.  2. Left ventricular ejection fraction, by estimation, is 60 to 65%. The  left ventricle has normal function. The left ventricle has no regional  wall motion abnormalities.  3. Right ventricular systolic function is normal. The right ventricular  size is mildly enlarged.  4. The inferior vena cava is dilated in size with <50% respiratory  variability, suggesting right atrial pressure of 15 mmHg.   ECHO: 10/19/2019 COMPLETE 1. Left ventricular ejection fraction, by estimation, is 55 to 60%. The  left ventricle has normal function. The left ventrical has no regional  wall motion abnormalities. There is mildly increased left ventricular  hypertrophy. Left ventricular diastolic  parameters are indeterminate.  2. Right ventricular systolic function is normal. The right ventricular  size is normal. There is moderately elevated pulmonary artery systolic  pressure.  3. Left atrial size was severely dilated.  4. The mitral valve is degenerative. Moderate mitral valve regurgitation.  5. Tricuspid valve regurgitation is moderate.  6. The aortic valve is bicuspid. Aortic valve regurgitation is mild to  moderate. Severe aortic valve stenosis.  7. The inferior vena cava is normal in size with <50% respiratory  variability, suggesting right atrial pressure of 8 mmHg.   CATH: 11/26/2019 1.   Severe multivessel coronary artery disease with occlusion of the left coronary artery and severe calcific stenosis of the right coronary artery 2.  Status post aortocoronary bypass surgery with continued patency of the LIMA to LAD graft and saphenous vein graft OM, atretic RIMA to RCA 3.  Known severe aortic stenosis by noninvasive assessment  Recommendations: Continue evaluation for treatment of severe aortic stenosis, likely TAVR.  Will review with multidisciplinary heart team and likely proceed with atherectomy and stenting of the RCA via radial access followed by TAVR once his imaging studies are completed. Diagnostic Dominance: Right  Intervention Successful orbital atherectomy and stenting of the RCA with 3 overlapping DES, complicated by small coronary perforation requiring covered stent placement (4.0x20 mm Papyrus) Recommend dual antiplatelet therapy with Aspirin 29m daily and Clopidogrel 732mdaily long-term (beyond 12 months) because of covered stent placement. Intervention    Right heart cath, 11/26/2019 [place] Cardiac Output 5.09 L/min  Fick Cardiac Output Index 3.02 (L/min)/BSA  RA A Wave -99 mmHg  RA V Wave 11 mmHg  RA Mean 6 mmHg  RV Systolic Pressure 56 mmHg  RV Diastolic Pressure -1 mmHg  RV EDP 4 mmHg  PA Systolic Pressure 59 mmHg  PA Diastolic Pressure 21 mmHg  PA Mean 37 mmHg  PW A Wave 19 mmHg  PW V Wave 24 mmHg  PW Mean 17 mmHg  AO Systolic Pressure 11025mHg  AO Diastolic Pressure 52 mmHg  AO Mean 74 mmHg  QP/QS 1  TPVR Index 12.26 HRUI  TSVR  Index 24.52 HRUI  PVR SVR Ratio 0.29  TPVR/TSVR Ratio 0.5   CAROTID DOPPLERS:  Right Carotid: Velocities in the right ICA are consistent with a 80-99% stenosis.   Left Carotid: Velocities in the left ICA are consistent with a 40-59%  stenosis.   Vertebrals: Bilateral vertebral arteries demonstrate antegrade flow.   The patient does have a high carotid bifurcation.  Laboratory Data:   Chemistry Recent  Labs  Lab 12/25/19 0014 12/28/19 0222  NA 137 138  K 4.0 4.7  CL 105 106  CO2 24 21*  GLUCOSE 124* 210*  BUN 15 26*  CREATININE 0.91 1.31*  CALCIUM 8.5* 8.6*  GFRNONAA >60 51*  GFRAA >60 60*  ANIONGAP 8 11    Lab Results  Component Value Date   ALT 42 12/28/2019   AST 49 (H) 12/28/2019   ALKPHOS 115 12/28/2019   BILITOT 0.8 12/28/2019   Hematology Recent Labs  Lab 12/25/19 0251 12/28/19 0222  WBC 9.3 8.4  RBC 2.89* 2.64*  HGB 9.5* 8.7*  HCT 29.0* 27.3*  MCV 100.3* 103.4*  MCH 32.9 33.0  MCHC 32.8 31.9  RDW 14.2 14.5  PLT 124* 112*   Cardiac Enzymes High Sensitivity Troponin:   Recent Labs  Lab 12/28/19 0222 12/28/19 0355  TROPONINIHS 1,590* 1,542*      BNP Recent Labs  Lab 12/28/19 0222  BNP 165.1*    DDimer  Recent Labs  Lab 12/28/19 0222  DDIMER 3.67*   TSH: No results found for: TSH Lipids: Lab Results  Component Value Date   CHOL 145 09/21/2019   HDL 63 09/21/2019   LDLCALC 67 09/21/2019   TRIG 71 09/21/2019   CHOLHDL 2.3 09/21/2019   HgbA1c: Lab Results  Component Value Date   HGBA1C 6.9 (H) 12/24/2019   Magnesium:  Magnesium  Date Value Ref Range Status  12/28/2019 1.7 1.7 - 2.4 mg/dL Final    Comment:    Performed at [facility], [place] [address] [place], [place] [address]     Radiology/Studies:  DG Chest 2 View  Result Date: 12/28/2019 CLINICAL DATA:  Fall EXAM: CHEST - 2 VIEW COMPARISON:  None. FINDINGS: Prior CABG. Cardiomegaly. Interstitial prominence within the lungs, most pronounced in the lower lobes could reflect mild edema. No effusions or acute bony abnormality. IMPRESSION: Cardiomegaly, question mild interstitial edema. Electronically Signed   By: Rolm Baptise M.D.   On: 12/28/2019 03:04   CT Head Wo Contrast  Result Date: 12/28/2019 CLINICAL DATA:  Syncope. EXAM: CT HEAD WITHOUT CONTRAST TECHNIQUE: Contiguous axial images were obtained from the base of the skull through the vertex without intravenous  contrast. COMPARISON:  None FINDINGS: Brain: No acute intracranial abnormality. Specifically, no hemorrhage, hydrocephalus, mass lesion, acute infarction, or significant intracranial injury. Vascular: No hyperdense vessel or unexpected calcification. Skull: No acute calvarial abnormality. Sinuses/Orbits: No acute findings Other: None IMPRESSION: No acute intracranial abnormality. Electronically Signed   By: Rolm Baptise M.D.   On: 12/28/2019 03:04   CT Angio Chest PE W and/or Wo Contrast  Result Date: 12/28/2019 CLINICAL DATA:  Chest pain and shortness of breath. Positive D-dimer. EXAM: CT ANGIOGRAPHY CHEST WITH CONTRAST TECHNIQUE: Multidetector CT imaging of the chest was performed using the standard protocol during bolus administration of intravenous contrast. Multiplanar CT image reconstructions and MIPs were obtained to evaluate the vascular anatomy. CONTRAST:  62m OMNIPAQUE IOHEXOL 350 MG/ML SOLN COMPARISON:  12/08/2019 FINDINGS: Cardiovascular: The heart is upper limits of normal in size for age. No  pericardial effusion. Stable surgical changes from coronary artery bypass surgery. Stable tortuosity, ectasia and moderate to advanced atherosclerotic calcifications involving the aorta. Stable dense three-vessel coronary artery calcifications and dense calcifications at the aortic valve. The pulmonary arterial tree is well opacified. No filling defects to suggest pulmonary embolism. Mediastinum/Nodes: Stable scattered borderline mediastinal and hilar lymph nodes, likely reactive. The esophagus is grossly normal. There is a stable moderate-sized hiatal hernia. Lungs/Pleura: Moderate-sized bilateral pleural effusions. There is also mild perihilar pulmonary edema. Moderate lower lobe peribronchial thickening and moderate bibasilar atelectasis. No definite infiltrates. No worrisome pulmonary lesions. Upper Abdomen: No significant upper abdominal findings. There is reflux of contrast down the IVC and into the  hepatic veins suggesting tricuspid regurgitation or right heart failure. Musculoskeletal: No chest wall mass, supraclavicular or axillary adenopathy. The bony thorax is intact. Stable thoracic compression fractures at T3, T7, T8 and L1. Review of the MIP images confirms the above findings. IMPRESSION: 1. No CT findings for pulmonary embolism. 2. Stable tortuosity, ectasia and moderate to advanced atherosclerotic calcifications involving the aorta. 3. Stable surgical changes from coronary artery bypass surgery. 4. Moderate-sized bilateral pleural effusions and mild perihilar pulmonary edema. 5. Moderate lower lobe peribronchial thickening and moderate bibasilar atelectasis. 6. Reflux of contrast down the IVC and into the hepatic veins suggesting tricuspid regurgitation or right heart failure. 7. Stable moderate-sized hiatal hernia. 8. Aortic atherosclerosis. Aortic Atherosclerosis (ICD10-I70.0). Aortic Atherosclerosis (ICD10-I70.0). Electronically Signed   By: Marijo Sanes M.D.   On: 12/28/2019 06:04   DG Femur Min 2 Views Right  Result Date: 12/28/2019 CLINICAL DATA:  Fall EXAM: RIGHT FEMUR 2 VIEWS COMPARISON:  None. FINDINGS: No acute bony abnormality. Specifically, no fracture, subluxation, or dislocation. Degenerative changes in the right knee and hip. Vascular calcifications. IMPRESSION: No acute bony abnormality. Electronically Signed   By: Rolm Baptise M.D.   On: 12/28/2019 03:03   ECHOCARDIOGRAM LIMITED  Result Date: 12/26/2019    ECHOCARDIOGRAM LIMITED REPORT   Patient Name:   Garrett Hughes [facility] Date of Exam: 12/26/2019 Medical Rec #:  [id number]     Height:       64.0 in Accession #:    [id number]    Weight:       141.4 lb Date of Birth:  1941-07-28     BSA:          1.688 m Patient Age:    9 years      BP:           107/68 mmHg Patient Gender: M             HR:           96 bpm. Exam Location:  Inpatient Procedure: Limited Echo and Color Doppler Indications:    Chest pain 786.50/R07.9  History:         Patient has prior history of Echocardiogram examinations, most                 recent 12/25/2019. CAD, Prior CABG, Aortic Valve Disease,                 Arrythmias:Atrial Fibrillation; Risk Factors:Diabetes,                 Dyslipidemia and Hypertension. GERD.  Sonographer:    Clayton Lefort RDCS (AE) Referring Phys: [id number] [place]  1. Limited evaluation of pericardial effusion. Trivial effusion is present that is not clinically significant.  2. Left ventricular ejection fraction, by estimation, is 60  to 65%. The left ventricle has normal function. The left ventricle has no regional wall motion abnormalities.  3. Right ventricular systolic function is normal. The right ventricular size is mildly enlarged.  4. The inferior vena cava is dilated in size with <50% respiratory variability, suggesting right atrial pressure of 15 mmHg. FINDINGS  Left Ventricle: Left ventricular ejection fraction, by estimation, is 60 to 65%. The left ventricle has normal function. The left ventricle has no regional wall motion abnormalities. Abnormal (paradoxical) septal motion consistent with post-operative status. Right Ventricle: The right ventricular size is mildly enlarged. No increase in right ventricular wall thickness. Right ventricular systolic function is normal. Pericardium: Trivial pericardial effusion is present. There is no evidence of cardiac tamponade. Aortic Valve: There is severe thickening of the aortic valve. There is severe calcifcation of the aortic valve. Venous: The inferior vena cava is dilated in size with less than 50% respiratory variability, suggesting right atrial pressure of 15 mmHg.  IVC IVC diam: 2.30 cm Eleonore Chiquito MD Electronically signed by Eleonore Chiquito MD Signature Date/Time: 12/26/2019/11:58:48 AM    Final    ECHOCARDIOGRAM LIMITED  Result Date: 12/24/2019    ECHOCARDIOGRAM LIMITED REPORT   Patient Name:   Garrett Hughes [facility] Date of Exam: 12/24/2019 Medical Rec #:  [id number]     Height:        65.0 in Accession #:    [id number]    Weight:       142.0 lb Date of Birth:  Aug 24, 1941     BSA:          1.710 m Patient Age:    59 years      BP:           105/63 mmHg Patient Gender: M             HR:           53 bpm. Exam Location:  Inpatient Procedure: Limited Echo Indications:    Pericardial effusion 423.9 / I31.3  History:        Patient has prior history of Echocardiogram examinations, most                 recent 10/19/2019. CAD, Prior CABG, Aortic Valve Disease,                 Arrythmias:Atrial Fibrillation, Signs/Symptoms:Chest Pain; Risk                 Factors:Diabetes, Dyslipidemia and Hypertension. GERD.  Sonographer:    Darlina Sicilian RDCS Referring Phys: Glenetta Hew MD  Sonographer Comments: Echo performed during CATH to check for pericardial effusion IMPRESSIONS  1. Limited echo for pericardial effusion Patient is post atherectomy of RCA with perforation covered with stent. No significant pericardial effusion is seen. FINDINGS  Additional Comments: Limited echo for pericardial effusion Patient is post atherectomy of RCA with perforation covered with stent. No significant pericardial effusion is seen. Jenkins Rouge MD Electronically signed by Jenkins Rouge MD Signature Date/Time: 12/24/2019/2:56:07 PM    Final     Assessment and Plan:   1. Acute on chronic diastolic CHF - discuss plan w/ MD - needs diuresis, R heart pressures were elevated 04/18 at R heart cath and on echo - however, w/ syncope, need to make sure not to decrease preload too much.  - also, Cr above d/c levels and pt got dye today, do not wish to make renal situation worse - pt got Lasix 40 mg IV and has responded well. -  consider continuing Lasix 40 mg IV BID, follow renal function and daily wts.   2. Syncope - no prodrome - happened in the setting of needing to use the restroom - he had diarrhea yesterday - was incontinent of bowel/bladder, no Sz activity reported  3. Severe AS - TAVR workup proceeding - has  appt w/ Dr Roxy Manns 05/03   Active Problems:   Acute on chronic diastolic CHF (congestive heart failure) (HCC)   Severe aortic stenosis   Syncope     For questions or updates, please contact [facility] Please consult [email or web address] for contact info under Cardiology/STEMI.   Signed, Rosaria Ferries, PA-C  12/28/2019 11:40 AM   Patient seen, examined. Available data reviewed. Agree with findings, assessment, and plan as outlined by Rosaria Ferries, PA-C.  The patient is independently interviewed and examined.  The patient's wife is at the bedside in the emergency department.  He is an alert, oriented, elderly male in no distress.  HEENT is normal, JVP is moderately elevated, lungs are diminished in the bases, otherwise clear.  Heart is irregularly irregular with a grade 3/6 harsh crescendo decrescendo murmur at the right upper sternal border with no diastolic murmur appreciable.  Abdomen is soft and nontender.  Extremities have trace pretibial edema.  Skin is warm and dry with no rash.  EKG shows atrial fibrillation, right bundle branch block, left anterior fascicular block, heart rate 70 bpm.  The patient is well-known to me after recent cardiac catheterization and atherectomy/complex PCI last week.  His procedure was complicated by coronary dissection and mild perforation treated with a covered stent.  He had a small periprocedural infarct based on cardiac biomarkers.  The patient recovered well and was discharged home.  However, after his return home he developed a diarrheal illness which is a longstanding intermittent problem for him.  He had sudden syncope when he got up from bed early this morning to go to the bathroom.  There are multiple possible etiologies including aortic stenosis along with significant conduction disease.  Orthostasis is also a strong possibility in the setting of his diarrheal illness.  A CT angiogram of the chest was performed and findings are consistent with acute on  chronic diastolic heart failure with bilateral pleural effusions and mild perihilar edema.  I agree with IV diuresis.  Recommend observation overnight on telemetry, repeat chest x-ray tomorrow morning, and orthostatic vital signs tomorrow morning.  Hold doxazosin and lisinopril. The patient's aortic stenosis will ultimately need to be treated with TAVR in the near future.  Sherren Mocha, M.D. 12/28/2019 3:48 PM

## 2019-12-28 NOTE — ED Triage Notes (Signed)
Pt BIB [facility] from home. Pt got up to use BR and had syncopal Episode (found by wife).   No CP; or pain at all. No complaints at all.   No obvious injuries besides small abrasion on R. Elbow that is currently wrapped.   VSS with EMS CBG 190 with EMS

## 2019-12-28 NOTE — ED Provider Notes (Signed)
TIME SEEN: 2:17 AM  CHIEF COMPLAINT: Syncope  HPI: Patient is a 79 year old male with history of A. fib, hypertension, diabetes, aortic stenosis, CAD status post CABG and recent arthrectomy and stenting of the RCA on 12/24/2019 by Dr. Burt Knack who presents to the emergency department today with [facility] after he had a syncopal event.  Reports that he has had intermittent diarrhea for 2 years.  States diarrhea started yesterday and wife reports he has had numerous episodes of nonbloody diarrhea.  No vomiting.  No abdominal pain.  Denies any fever.  She states "I think he just got so weak from that".  He reports that he got up to go to the bathroom tonight and had a syncopal event while walking to the bathroom.  He denies any preceding chest pain or chest discomfort, shortness of breath, lightheadedness, vertigo.  He is not sure if he hit his head.  He is on Plavix and aspirin.  Denies headache.  No numbness, tingling or focal weakness.  Denies recent bloody stools or melena.  No cough.  He has not had a Covid vaccination.  His sats are in the upper 80s on room air.  He does not wear oxygen at home.  No history of asthma or COPD.  He quit smoking many years ago.  Has an abrasion to the right elbow.  Complaining of right lateral thigh pain but was able to ambulate.  Ambulates at home without assistance.  He is unsure of his last tetanus vaccination but it appears in our records it was administered on 09/14/2018.  History provided by patient and patient's wife at bedside.  Cath 11/26/19:  1.  Severe multivessel coronary artery disease with occlusion of the left coronary artery and severe calcific stenosis of the right coronary artery 2.  Status post aortocoronary bypass surgery with continued patency of the LIMA to LAD graft and saphenous vein graft OM, atretic RIMA to RCA 3.  Known severe aortic stenosis by noninvasive assessment  Recommendations: Continue evaluation for treatment of severe aortic  stenosis, likely TAVR.  Will review with multidisciplinary heart team and likely proceed with atherectomy and stenting of the RCA via radial access followed by TAVR once his imaging studies are completed.  Cath 12/24/19:  Successful orbital atherectomy and stenting of the RCA with 3 overlapping DES, complicated by small coronary perforation requiring covered stent placement (4.0x20 mm [place])  Echo 12/26/19:  IMPRESSIONS    1. Limited evaluation of pericardial effusion. Trivial effusion is  present that is not clinically significant.  2. Left ventricular ejection fraction, by estimation, is 60 to 65%. The  left ventricle has normal function. The left ventricle has no regional  wall motion abnormalities.  3. Right ventricular systolic function is normal. The right ventricular  size is mildly enlarged.  4. The inferior vena cava is dilated in size with <50% respiratory  variability, suggesting right atrial pressure of 15 mmHg.   FINDINGS  Left Ventricle: Left ventricular ejection fraction, by estimation, is 60  to 65%. The left ventricle has normal function. The left ventricle has no  regional wall motion abnormalities. Abnormal (paradoxical) septal motion  consistent with post-operative  status.   Right Ventricle: The right ventricular size is mildly enlarged. No  increase in right ventricular wall thickness. Right ventricular systolic  function is normal.   Pericardium: Trivial pericardial effusion is present. There is no evidence  of cardiac tamponade.   Aortic Valve: There is severe thickening of the aortic valve. There  is  severe calcifcation of the aortic valve.   Venous: The inferior vena cava is dilated in size with less than 50%  respiratory variability, suggesting right atrial pressure of 15 mmHg.    IVC  IVC diam: 2.30 cm   PCP - Emeterio Reeve at [facility] in [place]  ROS: See HPI Constitutional: no fever  Eyes: no drainage  ENT:  no runny nose   Cardiovascular:  no chest pain  Resp: no SOB  GI: no vomiting GU: no dysuria Integumentary: no rash  Allergy: no hives  Musculoskeletal: no leg swelling  Neurological: no slurred speech ROS otherwise negative  PAST MEDICAL HISTORY/PAST SURGICAL HISTORY:  Past Medical History:  Diagnosis Date  . Anemia   . Atherosclerosis of native coronary artery of native heart without angina pectoris 01/05/2016  . Benign essential hypertension 01/05/2016  . Carotid artery disease ([place])   . Cataract, nuclear, right 05/06/2014  . Chronic atrial fibrillation ([place]) 12/20/2015  . Current use of long term anticoagulation 12/20/2015  . Diabetic kidney disease ([place]) 01/05/2016  . GERD (gastroesophageal reflux disease)   . GI bleed   . History of Barrett's esophagus 12/20/2015  . History of blood in urine   . 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 Hyperlipemia 10/26/2015  . Low back pain 05/02/2016  . Severe aortic stenosis   . Thrombocytopenia ([place])   . Type 2 diabetes mellitus with kidney complication, without long-term current use of insulin ([place]) 07/03/2016    MEDICATIONS:  Prior to Admission medications   Medication Sig Start Date End Date Taking? Authorizing Provider  ACCU-CHEK SOFTCLIX LANCETS lancets Use as instructed to check blood sugar up to qid. Dx Code E11.22. 06/02/18   Hali Marry, MD  Alcohol Swabs PADS Use as instructed to check blood sugar up to qid. Dx code E11.22 06/02/18   Emeterio Reeve, DO  AMBULATORY NON FORMULARY MEDICATION Medication Name: accu-chek aviva plus meter, accu-check aviva plus test strips & accu-chek softclix lancets to test twice a day. Dx: E11.29 06/03/18   Hali Marry, MD  aspirin 81 MG chewable tablet Chew 81 mg by mouth daily.     [provider]  Blood Glucose Calibration (ACCU-CHEK AVIVA) SOLN Use as instructed per manufacturer directions. Dx code E11.22 06/02/18   Emeterio Reeve, DO  Blood Glucose Monitoring Suppl (ACCU-CHEK AVIVA PLUS) w/Device KIT Use as instructed to check blood sugar up to qid. Dx code E11.22 06/02/18   Hali Marry, MD  clopidogrel (PLAVIX) 75 MG tablet TAKE 1 TABLET BY MOUTH  DAILY Patient taking differently: Take 75 mg by mouth daily.  04/07/19   Emeterio Reeve, DO  doxazosin (CARDURA) 8 MG tablet TAKE 1 TABLET BY MOUTH  DAILY Patient taking differently: Take 8 mg by mouth daily.  07/30/19   Emeterio Reeve, DO  ferrous sulfate 325 (65 FE) MG EC tablet Take 1 tablet (325 mg total) by mouth daily with breakfast. 09/14/18   Emeterio Reeve, DO  furosemide (LASIX) 40 MG tablet Take 0.5 tablets (20 mg total) by mouth daily. 12/26/19   Dunn, Dayna N, PA-C  glipiZIDE (GLUCOTROL) 5 MG tablet TAKE 1 TABLET BY MOUTH  TWICE A DAY BEFORE MEALS Patient taking differently: Take 5 mg by mouth 2 (two) times daily before a meal.  07/30/19   Emeterio Reeve, DO  glucose blood (ACCU-CHEK AVIVA PLUS) test strip Use as instructed to check blood  sugar up to qid. Dx Code E11.22. Disp generic per pt preference / insurance coverage 06/15/19   Emeterio Reeve, DO  Lancets [facility] ULTRASOFT) lancets Use as instructed to check blood sugar up to qid. Dx: E11.22 11/06/17   Emeterio Reeve, DO  lisinopril (ZESTRIL) 5 MG tablet Take 0.5 tablets (2.5 mg total) by mouth daily. 12/26/19   Dunn, Nedra Hai, PA-C  metFORMIN (GLUCOPHAGE) 1000 MG tablet TAKE 1 TABLET BY MOUTH  TWICE DAILY WITH A MEAL Patient taking differently: Take 1,000 mg by mouth 2 (two) times daily with a meal.  07/30/19   Emeterio Reeve, DO  metoprolol succinate (TOPROL-XL) 25 MG 24 hr tablet TAKE 1 TABLET BY MOUTH  DAILY Patient taking differently: Take 25 mg by mouth daily.  07/30/19   Emeterio Reeve, DO  Multiple Vitamin (MULTI-VITAMIN DAILY PO) Take 1 tablet by mouth daily.     [provider]  nitroGLYCERIN (NITROSTAT) 0.4 MG SL tablet Place 1 tablet (0.4 mg total)  under the tongue every 5 (five) minutes as needed for chest pain. 09/14/18   Emeterio Reeve, DO  pantoprazole (PROTONIX) 20 MG tablet TAKE 1 TABLET BY MOUTH  DAILY Patient taking differently: Take 20 mg by mouth daily.  07/30/19   Emeterio Reeve, DO  pravastatin (PRAVACHOL) 40 MG tablet TAKE 1 TABLET BY MOUTH  DAILY Patient taking differently: Take 40 mg by mouth daily.  07/30/19   Emeterio Reeve, DO    ALLERGIES:  Allergies  Allergen Reactions  . Atorvastatin     Other reaction(s): Myalgias (intolerance)  . Cholestyramine Other (See Comments)    Constipation  . Pravastatin Other (See Comments)    Patient currently taking  ????12/09/19    SOCIAL HISTORY:  Social History   Tobacco Use  . Smoking status: Former Smoker    Quit date: 09/10/1987    Years since quitting: 32.3  . Smokeless tobacco: Never Used  Substance Use Topics  . Alcohol use: No    FAMILY HISTORY: Family History  Problem Relation Age of Onset  . CAD Father   . Colon cancer Neg Hx   . Heart attack Neg Hx     EXAM: BP 104/66   Pulse 82   Resp (!) 25   Ht 5' 4" (1.626 m)   Wt 64.1 kg   SpO2 91%   BMI 24.26 kg/m  CONSTITUTIONAL: Alert and oriented x 4 and responds appropriately to questions.  Elderly, GCS 15, in no distress, pleasant HEAD: Normocephalic; atraumatic EYES: Conjunctivae clear, PERRL, EOMI, no conjunctival pallor ENT: normal nose; no rhinorrhea; dry mucous membranes; pharynx without lesions noted; no dental injury; no septal hematoma NECK: Supple, no meningismus, no LAD; no midline spinal tenderness, step-off or deformity; trachea midline CARD: Irregularly irregular and rate controlled; S1 and S2 appreciated; + systolic murmur no clicks, no rubs, no gallops RESP: Normal chest excursion without splinting or tachypnea; breath sounds clear and equal bilaterally; no wheezes, no rhonchi, no rales; patient is hypoxic into the upper 80s here on room air but no distress CHEST:  chest wall  stable, no crepitus or ecchymosis or deformity, nontender to palpation; no flail chest ABD/GI: Normal bowel sounds; non-distended; soft, non-tender, no rebound, no guarding; no ecchymosis or other lesions noted PELVIS:  stable, nontender to palpation BACK:  The back appears normal and is non-tender to palpation, there is no CVA tenderness; no midline spinal tenderness, step-off or deformity EXT: Normal ROM in all joints; non-tender to palpation; no edema; normal capillary refill; no  cyanosis, no bony deformity of patient's extremities, no joint effusion, compartments are soft, extremities are warm and well-perfused, no ecchymosis, small abrasion to the right elbow without laceration or skin tear, no bony tenderness over the right elbow no joint effusion, full range of motion in the right elbow, patient is tender to palpation over the lateral right femur with associated abrasion and some soft tissue swelling but no ecchymosis, 2+ DP and radial pulses bilaterally SKIN: Normal color for age and race; warm NEURO: Moves all extremities equally, reports normal sensation diffusely, normal speech, no facial asymmetry PSYCH: The patient's mood and manner are appropriate. Grooming and personal hygiene are appropriate.  MEDICAL DECISION MAKING: Patient here with syncopal event.  Could be secondary to orthostasis from recent diarrhea.  Patient also has significant cardiac history.  Differential also includes ACS, arrhythmia.  He is also hypoxic and feels warm to touch here.  Will check rectal temperature.  Pneumonia, COVID-19, PE, CHF also in the differential.  Anemia, electrolyte derangement also on the differential.  Does have an abrasion to the right elbow.  No bony tenderness.  Complaining of some pain of the right femur.  Will obtain CT of the head, chest x-ray, femur x-ray.  Tetanus vaccination up-to-date.  Declines pain medicine at this time.  EKG reviewed/interpreted and shows no new change compared to  previous.  He is hypoxic here and was placed on 2 L nasal cannula.  He does not wear oxygen at home.  Was a previous smoker but states he quit many years ago.  No history of asthma or COPD.  Lungs clear to auscultation.  He does not appear volume overloaded on my exam.  ED PROGRESS: Labs, EKG, imaging reviewed/interpreted.  Labs show mild elevation of creatinine of 1.31.  This could be from mild dehydration from diarrhea.  He does have dry mucous membranes on exam.  He is anemic with hemoglobin of 8.7 but this is stable.  His troponin is elevated at 1590 with no recent for comparison.  This could be secondary to his recent cardiac catheterization.  Repeat is pending to see if this is going up or going down.  His BNP is mildly elevated at 165.  Magnesium level normal.  Given his hypoxia, syncope with recent catheterization and hospitalization, patient high risk for PE.  D-dimer was obtained and is 3.67.  Will proceed with CTA of the chest.  CT head shows no acute abnormality.  X-ray of the right femur shows no acute abnormality.  Chest x-ray shows cardiomegaly with possible mild interstitial edema.  Orthostatic vital signs normal.   CTA of the chest shows no pulmonary embolus.  He does have moderate sized bilateral pleural effusions and mild pulmonary edema.  Will give IV Lasix.  Suspect this is what is contributing to his hypoxia today.  Repeat troponin is stable and slightly downtrending.  This could be secondary to recent cardiac catheterization on 12/24/19.  Will discuss with cardiology on-call.  Anticipate admission secondary to new oxygen requirement.  Patient still denies chest pain/discomfort or shortness of breath.  Resting comfortably.  No arrhythmias noted on cardiac monitoring other than atrial fibrillation but has been rate controlled.  Patient and wife updated with plan.   I reviewed all nursing notes and pertinent previous records as available.  I have reviewed and interpreted any EKGs, lab and  urine results, imaging (as available).     EKG Interpretation  Date/Time:  Tuesday December 28 2019 01:30:04 EDT Ventricular Rate:  93 PR  Interval:    QRS Duration: 144 QT Interval:  400 QTC Calculation: 498 R Axis:   -60 Text Interpretation: Atrial fibrillation RBBB and LAFB No significant change since last tracing Confirmed by Ripley Fraise [phone]) on 12/28/2019 1:39:28 AM         EKG Interpretation  Date/Time:  Tuesday December 28 2019 04:09:00 EDT Ventricular Rate:  70 PR Interval:    QRS Duration: 145 QT Interval:  413 QTC Calculation: 446 R Axis:   -50 Text Interpretation: Atrial fibrillation RBBB and LAFB No significant change since last tracing Confirmed by , Cyril Mourning [phone]) on 12/28/2019 4:18:24 AM       CRITICAL CARE Performed by: Cyril Mourning    Total critical care time: 65 minutes  Critical care time was exclusive of separately billable procedures and treating other patients.  Critical care was necessary to treat or prevent imminent or life-threatening deterioration.  Critical care was time spent personally by me on the following activities: development of treatment plan with patient and/or surrogate as well as nursing, discussions with consultants, evaluation of patient's response to treatment, examination of patient, obtaining history from patient or surrogate, ordering and performing treatments and interventions, ordering and review of laboratory studies, ordering and review of radiographic studies, pulse oximetry and re-evaluation of patient's condition.  NEGAN GRUDZIEN was evaluated in Emergency Department on 12/28/2019 for the symptoms described in the history of present illness. He was evaluated in the context of the global COVID-19 pandemic, which necessitated consideration that the patient might be at risk for infection with the SARS-CoV-2 virus that causes COVID-19. Institutional protocols and algorithms that pertain to the evaluation of patients at risk  for COVID-19 are in a state of rapid change based on information released by regulatory bodies including the CDC and federal and state organizations. These policies and algorithms were followed during the patient's care in the ED.       , Delice Bison, DO 12/28/19 [phone]

## 2019-12-28 NOTE — ED Notes (Signed)
Dinner tray ordered for pt

## 2019-12-29 ENCOUNTER — Ambulatory Visit: Payer: Medicare Other | Admitting: Cardiology

## 2019-12-29 ENCOUNTER — Inpatient Hospital Stay (HOSPITAL_COMMUNITY): Payer: Medicare Other

## 2019-12-29 LAB — BASIC METABOLIC PANEL
Anion gap: 13 (ref 5–15)
BUN: 23 mg/dL (ref 8–23)
CO2: 24 mmol/L (ref 22–32)
Calcium: 9 mg/dL (ref 8.9–10.3)
Chloride: 102 mmol/L (ref 98–111)
Creatinine, Ser: 1.19 mg/dL (ref 0.61–1.24)
GFR calc Af Amer: >60 mL/min (ref >60)
GFR calc non Af Amer: 58 mL/min — ABNORMAL LOW (ref >60)
Glucose, Bld: 138 mg/dL — ABNORMAL HIGH (ref 70–99)
Potassium: 4.1 mmol/L (ref 3.5–5.1)
Sodium: 139 mmol/L (ref 135–145)

## 2019-12-29 LAB — GLUCOSE, CAPILLARY
Glucose-Capillary: 162 mg/dL — ABNORMAL HIGH (ref 70–99)
Glucose-Capillary: 152 mg/dL — ABNORMAL HIGH (ref 70–99)
Glucose-Capillary: 183 mg/dL — ABNORMAL HIGH (ref 70–99)
Glucose-Capillary: 170 mg/dL — ABNORMAL HIGH (ref 70–99)

## 2019-12-29 MED ORDER — FUROSEMIDE 40 MG PO TABS
40.0000 mg | ORAL_TABLET | Freq: Every day | ORAL | Status: DC
  Administered 2019-12-30: 40 mg via ORAL
  Filled 2019-12-29: qty 1

## 2019-12-29 MED ORDER — FUROSEMIDE 10 MG/ML IJ SOLN
40.0000 mg | Freq: Once | INTRAMUSCULAR | Status: AC
  Administered 2019-12-29: 40 mg via INTRAVENOUS
  Filled 2019-12-29: qty 4

## 2019-12-29 NOTE — Progress Notes (Signed)
To the best of my knowledge, the student's charting is accurate.  

## 2019-12-29 NOTE — Progress Notes (Signed)
Progress Note  Patient Name: Garrett Hughes Date of Encounter: 12/29/2019  Primary Cardiologist: Olga Millers, MD   Subjective   Feeling well this morning. No chest pain or shortness of breath. Just returned to bed from the bathroom - no dizziness/lightheadedness.  Inpatient Medications    Scheduled Meds: . aspirin  81 mg Oral Daily  . clopidogrel  75 mg Oral Daily  . enoxaparin (LOVENOX) injection  40 mg Subcutaneous Q24H  . ferrous sulfate  325 mg Oral Q breakfast  . furosemide  40 mg Intravenous BID  . glipiZIDE  5 mg Oral BID AC  . insulin aspart  0-5 Units Subcutaneous QHS  . insulin aspart  0-9 Units Subcutaneous TID WC  . metoprolol succinate  25 mg Oral Daily  . multivitamin with minerals  1 tablet Oral Daily  . pantoprazole  20 mg Oral Daily  . pravastatin  40 mg Oral Daily  . sodium chloride flush  3 mL Intravenous Q12H   Continuous Infusions: . sodium chloride     PRN Meds: sodium chloride, acetaminophen, ALPRAZolam, nitroGLYCERIN, ondansetron (ZOFRAN) IV, sodium chloride flush, zolpidem   Vital Signs    Vitals:   12/28/19 1945 12/29/19 0044 12/29/19 0453 12/29/19 0722  BP: 111/70 119/61 122/70 121/72  Pulse: 99 72 74 86  Resp: 20 (!) 25 20 18   Temp: 97.7 F (36.5 C) 97.8 F (36.6 C) 98.5 F (36.9 C) 98.5 F (36.9 C)  TempSrc: Oral Oral Oral Oral  SpO2: 100% 96% 91% 96%  Weight: 64.9 kg  63.8 kg   Height: 5\' 4"  (1.626 m)       Intake/Output Summary (Last 24 hours) at 12/29/2019 0739 Last data filed at 12/29/2019 0737 Gross per 24 hour  Intake 240 ml  Output 2225 ml  Net -1985 ml   Last 3 Weights 12/29/2019 12/28/2019 12/28/2019  Weight (lbs) 140 lb 9.6 oz 143 lb 141 lb 5 oz  Weight (kg) 63.776 kg 64.864 kg 64.1 kg      Telemetry    Atrial fibrillation, no ventricular runs, no bradycardic events - Personally Reviewed  Physical Exam  Alert, oriented, elderly male in NAD GEN: No acute distress.   Neck: No JVD Cardiac: irregularly  irregular with 3/6 harsh systolic murmur best heard at the RUSB, but present throughout Respiratory: Clear to auscultation bilaterally. GI: Soft, nontender, non-distended  MS: No edema; No deformity. Neuro:  Nonfocal  Psych: Normal affect   Labs    High Sensitivity Troponin:   Recent Labs  Lab 12/28/19 0222 12/28/19 0355 12/28/19 2001 12/28/19 2153  TROPONINIHS 1,590* 1,542* 1,176* 1,013*      Chemistry Recent Labs  Lab 12/25/19 0014 12/28/19 0222 12/29/19 0429  NA 137 138 139  K 4.0 4.7 4.1  CL 105 106 102  CO2 24 21* 24  GLUCOSE 124* 210* 138*  BUN 15 26* 23  CREATININE 0.91 1.31* 1.19  CALCIUM 8.5* 8.6* 9.0  PROT  --  5.9*  --   ALBUMIN  --  2.7*  --   AST  --  49*  --   ALT  --  42  --   ALKPHOS  --  115  --   BILITOT  --  0.8  --   GFRNONAA >60 51* 58*  GFRAA >60 60* >60  ANIONGAP 8 11 13      Hematology Recent Labs  Lab 12/25/19 0251 12/28/19 0222  WBC 9.3 8.4  RBC 2.89* 2.64*  HGB 9.5* 8.7*  HCT  29.0* 27.3*  MCV 100.3* 103.4*  MCH 32.9 33.0  MCHC 32.8 31.9  RDW 14.2 14.5  PLT 124* 112*    BNP Recent Labs  Lab 12/28/19 0222  BNP 165.1*     DDimer  Recent Labs  Lab 12/28/19 0222  DDIMER 3.67*     Radiology    DG Chest 2 View  Result Date: 12/28/2019 CLINICAL DATA:  Fall EXAM: CHEST - 2 VIEW COMPARISON:  None. FINDINGS: Prior CABG. Cardiomegaly. Interstitial prominence within the lungs, most pronounced in the lower lobes could reflect mild edema. No effusions or acute bony abnormality. IMPRESSION: Cardiomegaly, question mild interstitial edema. Electronically Signed   By: Charlett Nose M.D.   On: 12/28/2019 03:04   CT Head Wo Contrast  Result Date: 12/28/2019 CLINICAL DATA:  Syncope. EXAM: CT HEAD WITHOUT CONTRAST TECHNIQUE: Contiguous axial images were obtained from the base of the skull through the vertex without intravenous contrast. COMPARISON:  None FINDINGS: Brain: No acute intracranial abnormality. Specifically, no hemorrhage,  hydrocephalus, mass lesion, acute infarction, or significant intracranial injury. Vascular: No hyperdense vessel or unexpected calcification. Skull: No acute calvarial abnormality. Sinuses/Orbits: No acute findings Other: None IMPRESSION: No acute intracranial abnormality. Electronically Signed   By: Charlett Nose M.D.   On: 12/28/2019 03:04   CT Angio Chest PE W and/or Wo Contrast  Result Date: 12/28/2019 CLINICAL DATA:  Chest pain and shortness of breath. Positive D-dimer. EXAM: CT ANGIOGRAPHY CHEST WITH CONTRAST TECHNIQUE: Multidetector CT imaging of the chest was performed using the standard protocol during bolus administration of intravenous contrast. Multiplanar CT image reconstructions and MIPs were obtained to evaluate the vascular anatomy. CONTRAST:  53mL OMNIPAQUE IOHEXOL 350 MG/ML SOLN COMPARISON:  12/08/2019 FINDINGS: Cardiovascular: The heart is upper limits of normal in size for age. No pericardial effusion. Stable surgical changes from coronary artery bypass surgery. Stable tortuosity, ectasia and moderate to advanced atherosclerotic calcifications involving the aorta. Stable dense three-vessel coronary artery calcifications and dense calcifications at the aortic valve. The pulmonary arterial tree is well opacified. No filling defects to suggest pulmonary embolism. Mediastinum/Nodes: Stable scattered borderline mediastinal and hilar lymph nodes, likely reactive. The esophagus is grossly normal. There is a stable moderate-sized hiatal hernia. Lungs/Pleura: Moderate-sized bilateral pleural effusions. There is also mild perihilar pulmonary edema. Moderate lower lobe peribronchial thickening and moderate bibasilar atelectasis. No definite infiltrates. No worrisome pulmonary lesions. Upper Abdomen: No significant upper abdominal findings. There is reflux of contrast down the IVC and into the hepatic veins suggesting tricuspid regurgitation or right heart failure. Musculoskeletal: No chest wall mass,  supraclavicular or axillary adenopathy. The bony thorax is intact. Stable thoracic compression fractures at T3, T7, T8 and L1. Review of the MIP images confirms the above findings. IMPRESSION: 1. No CT findings for pulmonary embolism. 2. Stable tortuosity, ectasia and moderate to advanced atherosclerotic calcifications involving the aorta. 3. Stable surgical changes from coronary artery bypass surgery. 4. Moderate-sized bilateral pleural effusions and mild perihilar pulmonary edema. 5. Moderate lower lobe peribronchial thickening and moderate bibasilar atelectasis. 6. Reflux of contrast down the IVC and into the hepatic veins suggesting tricuspid regurgitation or right heart failure. 7. Stable moderate-sized hiatal hernia. 8. Aortic atherosclerosis. Aortic Atherosclerosis (ICD10-I70.0). Aortic Atherosclerosis (ICD10-I70.0). Electronically Signed   By: Rudie Meyer M.D.   On: 12/28/2019 06:04   DG Femur Min 2 Views Right  Result Date: 12/28/2019 CLINICAL DATA:  Fall EXAM: RIGHT FEMUR 2 VIEWS COMPARISON:  None. FINDINGS: No acute bony abnormality. Specifically, no fracture, subluxation, or dislocation.  Degenerative changes in the right knee and hip. Vascular calcifications. IMPRESSION: No acute bony abnormality. Electronically Signed   By: Rolm Baptise M.D.   On: 12/28/2019 03:03    Patient Profile     79 y.o. male with multiple medical and cardiovascular problems recently underwent atherectomy and stenting of the RCA  Assessment & Plan    1. Syncope: suspect multifactorial (diarrhea, aortic stenosis, atrial fibrillation/conduction disease). Orthostatic vital signs from this morning reviewed. 18 mmHg BP decrease from supine to standing. Continue to hold lisinopril and doxazosin. Telemetry reviewed and shows atrial fibrillation, rate well controlled, no bradycardic events or heart block. 2. Acute on chronic diastolic HF: BL pleural effusion, perihilar edema on CXR, elevated BNP. Diuresed with IV  furosemide, creatinine stable, clinically improved. CXR done this morning, interpretation pending. I turned his O2 off this morning - O2 sats ranging 90-96% off O2 while I was in the room with him. 3. Severe AS: continue multidisciplinary team evaluation for TAVR. Upcoming appt with Dr Roxy Manns. CTA studies reviewed.  4. CAD with angina: s/p complex atherectomy and PCI last week complicated by dissection/contained perforation treated with overlapping DES and a Papyrus covered stent. No recurrent angina. Continue ASA and plavix. 5. Permanent atrial fibrillation: prior bleeding, has refused anticoagulation. Continue ASA and plavix. HR controlled.  Dispo: await CXR interpretation, transition to oral lasix need to avoid over-diuresis, remain off of cardura and lisinopril. Possible DC this afternoon, feeling much better.  For questions or updates, please contact [place] Please consult [email or web address] for contact info under     Signed, Sherren Mocha, MD  12/29/2019, 7:39 AM

## 2019-12-29 NOTE — Progress Notes (Signed)
SATURATION QUALIFICATIONS: (This note is used to comply with regulatory documentation for home oxygen)  Patient Saturations on Room Air at Rest = 95%  Patient Saturations on Room Air while Ambulating = 88%  Patient Saturations on 2 Liters of oxygen while Ambulating = 97%  Please briefly explain why patient needs home oxygen: Patient desat while ambualting

## 2019-12-29 NOTE — Progress Notes (Signed)
   Talked with RN, reported patient dropped O2 into the mid 80s with ambulation to the bathroom. Back on O2. CXR appear slightly worse today. Will give additional IV lasix 40mg  x1 now.   Signed , NP-C 12/29/2019, 1:38 PM Pager: [phone]

## 2019-12-29 NOTE — Progress Notes (Signed)
Patient with 2sec pause at 2030 and additional 2.3sec pause at 2345.  Cardiology MD notified.  Patient asymptomatic.

## 2019-12-30 ENCOUNTER — Encounter (INDEPENDENT_AMBULATORY_CARE_PROVIDER_SITE_OTHER): Payer: Medicare Other

## 2019-12-30 ENCOUNTER — Telehealth: Payer: Self-pay | Admitting: Cardiology

## 2019-12-30 DIAGNOSIS — R55 Syncope and collapse: Secondary | ICD-10-CM

## 2019-12-30 LAB — BASIC METABOLIC PANEL
Anion gap: 11 (ref 5–15)
BUN: 21 mg/dL (ref 8–23)
CO2: 29 mmol/L (ref 22–32)
Calcium: 8.8 mg/dL — ABNORMAL LOW (ref 8.9–10.3)
Chloride: 100 mmol/L (ref 98–111)
Creatinine, Ser: 1.14 mg/dL (ref 0.61–1.24)
GFR calc Af Amer: >60 mL/min (ref >60)
GFR calc non Af Amer: >60 mL/min (ref >60)
Glucose, Bld: 136 mg/dL — ABNORMAL HIGH (ref 70–99)
Potassium: 4.1 mmol/L (ref 3.5–5.1)
Sodium: 140 mmol/L (ref 135–145)

## 2019-12-30 LAB — GLUCOSE, CAPILLARY
Glucose-Capillary: 143 mg/dL — ABNORMAL HIGH (ref 70–99)
Glucose-Capillary: 253 mg/dL — ABNORMAL HIGH (ref 70–99)

## 2019-12-30 NOTE — TOC Transition Note (Signed)
Transition of Care [facility]) - CM/SW Discharge Note   Patient Details  Name: Garrett Hughes MRN: [id number] Date of Birth: [birth year]  Transition of Care [facility] CM/SW Contact:  Leone Haven, RN Phone Number: 12/30/2019, 12:05 PM   Clinical Narrative:    NCM spoke with patient wife, she states [employer] is ok to get home oxygen with. NCM left vm with Melissa with [employer], this will be delivered to room prior to dc.   Final next level of care: Home/Self Care Barriers to Discharge: No Barriers Identified   Patient Goals and CMS Choice Patient states their goals for this hospitalization and ongoing recovery are:: get better   Choice offered to / list presented to : NA  Discharge Placement                       Discharge Plan and Services                DME Arranged: Oxygen DME Agency: [employer] Date DME Agency Contacted: 12/30/19 Time DME Agency Contacted: [id number] Representative spoke with at DME Agency: Melissa HH Arranged: NA          Social Determinants of Health (SDOH) Interventions     Readmission Risk Interventions No flowsheet data found.

## 2019-12-30 NOTE — Telephone Encounter (Signed)
Garrett Hughes with [facility] called to report Afib on the pt today at 12:03 pm with HR 61-89 with avg HR 72.   Pt is post hosp for syncope with D/C 12/28/19.   Pt has h/o afib on D/C summary with refused anticoagulation... on ASA and Plavix.

## 2019-12-30 NOTE — Progress Notes (Signed)
Zio patch placed onto patient.  All instructions and information reviewed with patient, they verbalize understanding with no questions. 

## 2019-12-30 NOTE — Progress Notes (Signed)
   12/30/19 0450  Assess: MEWS Score  BP (!) 134/103  Pulse Rate 65  ECG Heart Rate 79  Resp (!) 26  SpO2 99 %  Assess: MEWS Score  MEWS Temp 0  MEWS Systolic 0  MEWS Pulse 0  MEWS RR 2  MEWS LOC 0  MEWS Score 2  MEWS Score Color Yellow  Assess: if the MEWS score is Yellow or Red  Were vital signs taken at a resting state? No  Early Detection of Sepsis Score *See Row Information* Low  MEWS guidelines implemented *See Row Information* No, vital signs rechecked   This yellow MEWS occurred due to VS being taken after ambulation.  Rechecked VS show improvement and MEWS now a green

## 2019-12-30 NOTE — Plan of Care (Signed)

## 2019-12-30 NOTE — Discharge Instructions (Signed)

## 2019-12-30 NOTE — Discharge Summary (Addendum)
[place] VALVE TEAM  Discharge Summary    Patient ID: Garrett Hughes MRN: [id number]; DOB: 1941-06-06  Admit date: 12/28/2019 Discharge date: 12/30/2019  Primary Care Provider: Emeterio Reeve, DO  Primary Cardiologist: Kirk Ruths, MD   Discharge Diagnoses    Principal Problem:   Syncope Active Problems:   Benign essential hypertension   Hx of CABG   Hyperlipidemia   GERD (gastroesophageal reflux disease)   Diabetes mellitus without complication (HCC)   Atrial fibrillation (HCC)   CAD (coronary artery disease)   Acute on chronic diastolic CHF (congestive heart failure) (HCC)   Severe aortic stenosis   Allergies Allergies  Allergen Reactions  . Atorvastatin     Other reaction(s): Myalgias (intolerance)  . Cholestyramine Other (See Comments)    Constipation  . Pravastatin Other (See Comments)    Does not tolerate high doses    Diagnostic Studies/Procedures    Limited echo 12/26/19 IMPRESSIONS  1. Limited evaluation of pericardial effusion. Trivial effusion is  present that is not clinically significant.  2. Left ventricular ejection fraction, by estimation, is 60 to 65%. The  left ventricle has normal function. The left ventricle has no regional  wall motion abnormalities.  3. Right ventricular systolic function is normal. The right ventricular  size is mildly enlarged.  4. The inferior vena cava is dilated in size with <50% respiratory  variability, suggesting right atrial pressure of 15 mmHg.   _____________     History of Present Illness     Garrett Hughes is a 79 y.o. male with a hx of CABG 1997, chronic atrial fibrillation (pt declines anticoagulation), severe aortic stenosis,RBBB, carotid artery disease,HTN, DM, HLD, chronic appearing anemia/thrombocytopenia, GERD, Barrett's esophagus, h/o GIB who presented to [facility] on 12/28/19 with syncope.   He recently was seen in clinic and reported increased  dyspnea with more moderate activities. Echocardiogram February 2021 showed normal LV function, mild left ventricular hypertrophy, severe left atrial enlargement, moderate mitral regurgitation, moderate tricuspid regurgitation, severe aortic stenosis with mean gradient 39 mmHg and mild to moderate aortic insufficiency. Dr. Stanford Breed recommended proceeding with evaluation for possible TAVR, starting with R/L heart catheterization. He underwent this procedure 11/26/19 showing severe multivessel coronary artery disease with occlusion of the left coronary artery and severe calcific stenosis of the right coronary artery, and continued patency of the LIMA to LAD graft and saphenous vein graft OM, atretic RIMA to RCA. Plan at that time was to review with multidisciplinary heart team to likely bring back for atherectomy of the RCA. He was already on ASA/Plavix.  In the meantime he also had outpatient carotid studies demonstrating 80-99% RICA and 75-10% LICA stenosis, warranting vascular surgery consultation. Dr. Trula Slade plans to obtain a CT angio head/neck to see if he is a candidate for TCAR. This will be delayed until after TAVR.  He was brought back to the cath lab 12/24/19 for planned atherectomy and underwent successful orbital atherectomy and stenting of the RCA with 3 overlapping DES, complicated by small coronary perforation requiring covered stent placement (4.0x20 mm Papyrus). DAPT with Aspirin '81mg'$  daily and Clopidogrel '75mg'$  daily long-term (beyond 12 months) was recommended because of covered stent placement. Limited echo was done during cath which showed no pericardial effusion. Post-procedure he did have some residual pleuritic chest pain that resolved without acute intervention.  He then presented back to [facility] on 12/28/19 with syncope. He reported getting up to go to the bathroom and waking up on the  floor. He had also been having diarrhea.    Hospital Course     Consultants: none  Syncope: suspect  multifactorial (diarrhea, aortic stenosis, atrial fibrillation/conduction disease).  -- Orthostatic vital signs showed an 18 mmHg BP decrease from supine to standing. His lisinopril and doxazosin have been held. Will continue to hold his doxazosin and lisinopril at discharge.  -- Telemetry reviewed and shows atrial fibrillation, rate well controlled, no bradycardic events or heart block. One 2.3 second pause that was asymptomatic. Plan to place a Zio patch prior to discharge.   Acute on chronic diastolic HF: work up revealed BL pleural effusion, perihilar edema on CXR, elevated BNP. -- Diuresed with IV furosemide, creatinine stable, clinically improved. CXR done yesterday showed continued pulmonary vascular congestion. Treated with another dose of IV lasix. Plan to discharge on home Lasix 27m daily.  -- O2 sats ranging 90-96% off O2 currently. He did have some desaturations with ambulation. Will plan to discharge with home oxygen.   Severe AS: continue multidisciplinary team evaluation for TAVR. Upcoming appt with Dr ORoxy Manns   CAD with angina: s/p complex atherectomy and PCI last week complicated by dissection/contained perforation treated with overlapping DES and a Papyrus covered stent. No recurrent angina. Continue ASA and plavix.  Permanent atrial fibrillation: prior bleeding, has refused anticoagulation. Continue ASA and plavix. HR well controlled controlled.  _____________  Discharge Vitals Blood pressure 112/64, pulse 71, temperature 98.4 F (36.9 C), temperature source Oral, resp. rate 15, height _0  (1.626 m), weight 61.8 kg, SpO2 93 %.  Filed Weights   12/28/19 1945 12/29/19 0453 12/30/19 0456  Weight: 64.9 kg 63.8 kg 61.8 kg    GEN: Well nourished, well developed, in no acute distress HEENT: normal Neck: no JVD or masses Cardiac: irreg irreg. 4/6 harsh SEM. No rubs, or gallops,no edema  Respiratory:  clear to auscultation bilaterally, normal work of breathing GI: soft,  nontender, nondistended, + BS MS: no deformity or atrophy Skin: warm and dry, no rash Neuro:  Alert and Oriented x 3, Strength and sensation are intact Psych: euthymic mood, full affect    Labs & Radiologic Studies    CBC Recent Labs    12/28/19 0222  WBC 8.4  NEUTROABS 6.9  HGB 8.7*  HCT 27.3*  MCV 103.4*  PLT 1161   Basic Metabolic Panel Recent Labs    12/28/19 0222 12/28/19 0222 12/29/19 0429 12/30/19 0239  NA 138   < > 139 140  K 4.7   < > 4.1 4.1  CL 106   < > 102 100  CO2 21*   < > 24 29  GLUCOSE 210*   < > 138* 136*  BUN 26*   < > 23 21  CREATININE 1.31*   < > 1.19 1.14  CALCIUM 8.6*   < > 9.0 8.8*  MG 1.7  --   --   --    < > = values in this interval not displayed.   Liver Function Tests Recent Labs    12/28/19 0222  AST 49*  ALT 42  ALKPHOS 115  BILITOT 0.8  PROT 5.9*  ALBUMIN 2.7*   No results for input(s): LIPASE, AMYLASE in the last 72 hours. Cardiac Enzymes No results for input(s): CKTOTAL, CKMB, CKMBINDEX, TROPONINI in the last 72 hours. BNP Invalid input(s): POCBNP D-Dimer Recent Labs    12/28/19 0222  DDIMER 3.67*   Hemoglobin A1C No results for input(s): HGBA1C in the last 72 hours. Fasting Lipid Panel No results  for input(s): CHOL, HDL, LDLCALC, TRIG, CHOLHDL, LDLDIRECT in the last 72 hours. Thyroid Function Tests Recent Labs    12/28/19 2001  TSH 3.429   _____________  DG Chest 2 View  Result Date: 12/29/2019 CLINICAL DATA:  Reason for exam: chf Patient reports recent x4 cardiac stents. Reports slight sob. Denies any chest pains. Hx of cad, a-fib, diabetes. Hx of CABG 1997. Quit smoking 1989 EXAM: CHEST - 2 VIEW COMPARISON:  Chest radiograph, 12/28/2019.  Chest CT, 12/28/2019. FINDINGS: Enlarged cardiac silhouette, stable. Stable changes from prior CABG surgery no mediastinal or hilar masses. Stable coronary artery stent. Bilateral interstitial thickening. Small bilateral pleural effusions. There is no lung consolidation to  suggest pneumonia. No pneumothorax. Mild compression deformities along the midthoracic spine, which are stable. Skeletal structures are diffusely demineralized IMPRESSION: 1. Findings are similar to the previous day's studies, consistent with congestive heart failure with interstitial pulmonary edema. Electronically Signed   By: Lajean Manes M.D.   On: 12/29/2019 09:51   DG Chest 2 View  Result Date: 12/28/2019 CLINICAL DATA:  Fall EXAM: CHEST - 2 VIEW COMPARISON:  None. FINDINGS: Prior CABG. Cardiomegaly. Interstitial prominence within the lungs, most pronounced in the lower lobes could reflect mild edema. No effusions or acute bony abnormality. IMPRESSION: Cardiomegaly, question mild interstitial edema. Electronically Signed   By: Rolm Baptise M.D.   On: 12/28/2019 03:04   CT Head Wo Contrast  Result Date: 12/28/2019 CLINICAL DATA:  Syncope. EXAM: CT HEAD WITHOUT CONTRAST TECHNIQUE: Contiguous axial images were obtained from the base of the skull through the vertex without intravenous contrast. COMPARISON:  None FINDINGS: Brain: No acute intracranial abnormality. Specifically, no hemorrhage, hydrocephalus, mass lesion, acute infarction, or significant intracranial injury. Vascular: No hyperdense vessel or unexpected calcification. Skull: No acute calvarial abnormality. Sinuses/Orbits: No acute findings Other: None IMPRESSION: No acute intracranial abnormality. Electronically Signed   By: Rolm Baptise M.D.   On: 12/28/2019 03:04   CT Angio Chest PE W and/or Wo Contrast  Result Date: 12/28/2019 CLINICAL DATA:  Chest pain and shortness of breath. Positive D-dimer. EXAM: CT ANGIOGRAPHY CHEST WITH CONTRAST TECHNIQUE: Multidetector CT imaging of the chest was performed using the standard protocol during bolus administration of intravenous contrast. Multiplanar CT image reconstructions and MIPs were obtained to evaluate the vascular anatomy. CONTRAST:  58m OMNIPAQUE IOHEXOL 350 MG/ML SOLN COMPARISON:   12/08/2019 FINDINGS: Cardiovascular: The heart is upper limits of normal in size for age. No pericardial effusion. Stable surgical changes from coronary artery bypass surgery. Stable tortuosity, ectasia and moderate to advanced atherosclerotic calcifications involving the aorta. Stable dense three-vessel coronary artery calcifications and dense calcifications at the aortic valve. The pulmonary arterial tree is well opacified. No filling defects to suggest pulmonary embolism. Mediastinum/Nodes: Stable scattered borderline mediastinal and hilar lymph nodes, likely reactive. The esophagus is grossly normal. There is a stable moderate-sized hiatal hernia. Lungs/Pleura: Moderate-sized bilateral pleural effusions. There is also mild perihilar pulmonary edema. Moderate lower lobe peribronchial thickening and moderate bibasilar atelectasis. No definite infiltrates. No worrisome pulmonary lesions. Upper Abdomen: No significant upper abdominal findings. There is reflux of contrast down the IVC and into the hepatic veins suggesting tricuspid regurgitation or right heart failure. Musculoskeletal: No chest wall mass, supraclavicular or axillary adenopathy. The bony thorax is intact. Stable thoracic compression fractures at T3, T7, T8 and L1. Review of the MIP images confirms the above findings. IMPRESSION: 1. No CT findings for pulmonary embolism. 2. Stable tortuosity, ectasia and moderate to advanced atherosclerotic calcifications involving  the aorta. 3. Stable surgical changes from coronary artery bypass surgery. 4. Moderate-sized bilateral pleural effusions and mild perihilar pulmonary edema. 5. Moderate lower lobe peribronchial thickening and moderate bibasilar atelectasis. 6. Reflux of contrast down the IVC and into the hepatic veins suggesting tricuspid regurgitation or right heart failure. 7. Stable moderate-sized hiatal hernia. 8. Aortic atherosclerosis. Aortic Atherosclerosis (ICD10-I70.0). Aortic Atherosclerosis  (ICD10-I70.0). Electronically Signed   By: Marijo Sanes M.D.   On: 12/28/2019 06:04   CARDIAC CATHETERIZATION  Result Date: 12/24/2019 Successful orbital atherectomy and stenting of the RCA with 3 overlapping DES, complicated by small coronary perforation requiring covered stent placement (4.0x20 mm Papyrus)  CT CORONARY MORPH W/CTA COR W/SCORE W/CA W/CM &/OR WO/CM  Addendum Date: 12/12/2019   ADDENDUM REPORT: 12/12/2019 18:39 CLINICAL DATA:  Aortic stenosis EXAM: Cardiac TAVR CT TECHNIQUE: The patient was scanned on a Siemens Force 235 slice scanner. A 120 kV retrospective scan was triggered in the descending thoracic aorta at 111 HU's. Gantry rotation speed was 270 msecs and collimation was .9 mm. No beta blockade or nitro were given. The 3D data set was reconstructed in 5% intervals of the R-R cycle. Systolic and diastolic phases were analyzed on a dedicated work station using MPR, MIP and VRT modes. The patient received 149m OMNIPAQUE IOHEXOL 350 MG/ML SOLN of contrast. FINDINGS: Motion artifact reduces the diagnostic quality of this exam. Aortic Valve: Tricuspid aortic valve. Severely reduced cusp separation. Severely thickened, severely calcified aortic valve cusps. AV calcium score: 1593 Annulus measurements made at 5% of R-R interval to reduce motion artifact. Virtual Basal Annulus Measurements: Maximum/Minimum Diameter: 27.8 x 21.2 mm Perimeter: 78.3 mm Area: 466 mm2 LVOT calcification present. Based on these measurements, the annulus would be suitable for a 26 mm Sapien 3 valve. Sinus of Valsalva Measurements: Non-coronary:  33 mm Right - coronary:  31 mm Left - coronary:  32 mm Sinus of Valsalva Height: Left: 20 mm Right: 20.5 mm Aorta: Severe calcifications. Sinotubular Junction:  28 mm Ascending Thoracic Aorta:  38 mm Aortic Arch:  28 mm Descending Thoracic Aorta:  29 mm Coronary Artery Height above Annulus: Left Main: 16.5 mm. Left main is severely calcified and is known to be occluded. Right  Coronary: 15.3 mm Coronary Arteries: Severe 3 vessel coronary artery calcifications. S/p CABG. LIMA graft to LAD and SVG to L circumflex system appear patent. Optimum Fluoroscopic Angle for Delivery: RAO 0, CRA 1 No left atrial appendage thrombus. IMPRESSION: 1. Tricuspid aortic valve. Severely reduced cusp separation. Severely thickened, severely calcified aortic valve cusps. 2.  AV calcium score 1593 3. Annulus area: 466 mm2. The annulus would be suitable for a 26 mm Sapien 3 valve. LVOT calcifications. 4. Adequate right coronary height from the annulus. Left coronary artery height is adequate but is known to be occluded. 5.  Borderline mid ascending aorta dilation, 38 mm. 6. Optimum Fluoroscopic Angle for Delivery: RAO 0, CRA 1 Electronically Signed   By: GCherlynn Kaiser  On: 12/12/2019 18:39   Result Date: 12/12/2019 EXAM: OVER-READ INTERPRETATION  CT CHEST The following report is an over-read performed by radiologist Dr. DVinnie [facility]tonof [facility] [place] 12/08/2019. This over-read does not include interpretation of cardiac or coronary anatomy or pathology. The coronary calcium score/coronary CTA interpretation by the cardiologist is attached. COMPARISON:  CT the chest, abdomen and pelvis 07/28/2019. FINDINGS: Extracardiac findings will be described separately under dictation for contemporaneously obtained CTA chest, abdomen and pelvis. IMPRESSION: Please see separate dictation for contemporaneously obtained  CTA chest, abdomen and pelvis dated 12/08/2019 for full description of relevant extracardiac findings. Electronically Signed: By: Vinnie Langton M.D. On: 12/08/2019 12:51   CT ANGIO CHEST AORTA W/CM & OR WO/CM  Result Date: 12/08/2019 CLINICAL DATA:  79 year old male with history of severe aortic stenosis. Preprocedural study prior to potential transcatheter aortic valve replacement (TAVR) procedure. EXAM: CT ANGIOGRAPHY CHEST, ABDOMEN AND PELVIS TECHNIQUE: Multidetector CT imaging  through the chest, abdomen and pelvis was performed using the standard protocol during bolus administration of intravenous contrast. Multiplanar reconstructed images and MIPs were obtained and reviewed to evaluate the vascular anatomy. CONTRAST:  176m OMNIPAQUE IOHEXOL 350 MG/ML SOLN COMPARISON:  CT the abdomen and pelvis 02/18/2012. FINDINGS: CTA CHEST FINDINGS Cardiovascular: Heart size is mildly enlarged. There is no significant pericardial fluid, thickening or pericardial calcification. There is aortic atherosclerosis, as well as atherosclerosis of the great vessels of the mediastinum and the coronary arteries, including calcified atherosclerotic plaque in the left main, left anterior descending, left circumflex and right coronary arteries. Status post median sternotomy for CABG including LIMA to the LAD. Severe thickening and calcification of the aortic valve. Calcifications of the mitral annulus and mitral valve. Mediastinum/Lymph Nodes: No pathologically enlarged mediastinal or hilar lymph nodes. Moderate hiatal hernia. No axillary lymphadenopathy. Lungs/Pleura: Widespread areas of mild ground-glass attenuation and interlobular septal thickening noted throughout the lungs bilaterally, favored to reflect a background of mild pulmonary edema. Trace bilateral pleural effusions lying dependently (right greater than left). No confluent consolidative airspace disease. No suspicious appearing pulmonary nodules or masses are noted. Musculoskeletal/Soft Tissues: Median sternotomy wires. There are no aggressive appearing lytic or blastic lesions noted in the visualized portions of the skeleton. Multiple vertebral body compression fractures at the T3, T7 and T8, most severe at T3 where there is 30% loss of anterior vertebral body height. CTA ABDOMEN AND PELVIS FINDINGS Hepatobiliary: Liver has a nodular contour, which may suggest early changes of cirrhosis. No suspicious cystic or solid hepatic lesions. No intra or  extrahepatic biliary ductal dilatation. Gallbladder is normal in appearance. Pancreas: Diffuse pancreatic atrophy. No pancreatic mass. No pancreatic ductal dilatation. No pancreatic or peripancreatic fluid collections or inflammatory changes. Spleen: Unremarkable. Adrenals/Urinary Tract: 1.7 cm low-attenuation lesion in the lower pole the right kidney, compatible with a simple cyst. Left kidney and bilateral adrenal glands are normal in appearance. No hydroureteronephrosis. Urinary bladder is normal in appearance. Stomach/Bowel: Normal appearance of the intra-abdominal portion of the stomach. No pathologic dilatation of small bowel or colon. Numerous colonic diverticulae are noted, without definite surrounding inflammatory changes to suggest an acute diverticulitis at this time. Normal appendix. Vascular/Lymphatic: Aortic atherosclerosis, with vascular findings and measurements pertinent to potential TAVR procedure, as detailed below. No aneurysm or dissection noted in the abdominal or pelvic vasculature. No lymphadenopathy noted in the abdomen or pelvis. Reproductive: Prostate gland and seminal vesicles are unremarkable in appearance. Other: Trace volume of ascites noted in the visualized portions of the peritoneal cavity. No pneumoperitoneum. Musculoskeletal: Compression fractures are noted at L1 and L4, with post vertebroplasty changes at L4. There are no aggressive appearing lytic or blastic lesions noted in the visualized portions of the skeleton. VASCULAR MEASUREMENTS PERTINENT TO TAVR: AORTA: Minimal Aortic Diameter-14 x 11 mm Severity of Aortic Calcification-severe RIGHT PELVIS: Right Common Iliac Artery - Minimal Diameter-6.1 x 4.2 mm Tortuosity-mild-to-moderate Calcification-severe Right External Iliac Artery - Minimal Diameter-7.7 x 2.9 mm Tortuosity-moderate Calcification-moderate Right Common Femoral Artery - Minimal Diameter-6.7 x 1.7 mm Tortuosity-mild Calcification-moderate LEFT PELVIS: Left Common  Iliac Artery - Minimal Diameter-3.7 x 2.1 mm Tortuosity-mild-to-moderate Calcification-severe Left External Iliac Artery - Minimal Diameter-5.7 x 7.2 mm Tortuosity-moderate Calcification-mild-to-moderate Left Common Femoral Artery - Minimal Diameter-7.7 x 3.2 mm Tortuosity-mild Calcification-moderate Review of the MIP images confirms the above findings. IMPRESSION: 1. Vascular findings and measurements pertinent to potential TAVR procedure, as detailed above. 2. Severe thickening calcification of the aortic valve, compatible with reported clinical history of severe aortic stenosis. 3. Aortic atherosclerosis, in addition to left main and 3 vessel coronary artery disease. Status post median sternotomy for CABG including LIMA to the LAD. 4. Mild cardiomegaly with findings suggestive of mild congestive heart failure, as above. 5. The appearance of the liver suggests underlying cirrhosis. 6. Diffuse pancreatic atrophy. 7. Severe colonic diverticulosis without evidence of acute diverticulitis at this time. 8. Additional incidental findings, as above. Electronically Signed   By: Vinnie Langton M.D.   On: 12/08/2019 14:09   DG Femur Min 2 Views Right  Result Date: 12/28/2019 CLINICAL DATA:  Fall EXAM: RIGHT FEMUR 2 VIEWS COMPARISON:  None. FINDINGS: No acute bony abnormality. Specifically, no fracture, subluxation, or dislocation. Degenerative changes in the right knee and hip. Vascular calcifications. IMPRESSION: No acute bony abnormality. Electronically Signed   By: Rolm Baptise M.D.   On: 12/28/2019 03:03   VAS US CAROTID  Result Date: 12/08/2019 Carotid Arterial Duplex Study Indications:       Aortic stenosis. Risk Factors:      Diabetes. Limitations        Today's exam was limited due to the high bifurcation of the                    carotid, the patient's respiratory variation, patient                    movement and heavy calcification and the resulting shadowing. Comparison Study:  No prior studies.  Performing Technologist: Oliver Hum RVT  Examination Guidelines: A complete evaluation includes B-mode imaging, spectral Doppler, color Doppler, and power Doppler as needed of all accessible portions of each vessel. Bilateral testing is considered an integral part of a complete examination. Limited examinations for reoccurring indications may be performed as noted.  Right Carotid Findings: +----------+--------+--------+--------+-----------------------+--------------+           PSV cm/sEDV cm/sStenosisPlaque Description     Comments       +----------+--------+--------+--------+-----------------------+--------------+ CCA Prox  32      5                                      tortuous       +----------+--------+--------+--------+-----------------------+--------------+ CCA Distal18      5               smooth and heterogenous               +----------+--------+--------+--------+-----------------------+--------------+ ICA Prox  491     171     80-99%  calcific               tortuous       +----------+--------+--------+--------+-----------------------+--------------+ ICA Mid   83      21              smooth and heterogenous               +----------+--------+--------+--------+-----------------------+--------------+ ICA Distal55      15  tortuous       +----------+--------+--------+--------+-----------------------+--------------+ ECA                                                      Not visualized +----------+--------+--------+--------+-----------------------+--------------+ +----------+--------+-------+--------+-------------------+           PSV cm/sEDV cmsDescribeArm Pressure (mmHG) +----------+--------+-------+--------+-------------------+ [id number]                                         +----------+--------+-------+--------+-------------------+ +---------+--------+--+--------+--+---------+ VertebralPSV  cm/s86EDV cm/s35Antegrade +---------+--------+--+--------+--+---------+  Left Carotid Findings: +----------+--------+--------+--------+-----------------------+--------+           PSV cm/sEDV cm/sStenosisPlaque Description     Comments +----------+--------+--------+--------+-----------------------+--------+ CCA Prox  80      24                                     tortuous +----------+--------+--------+--------+-----------------------+--------+ CCA Mid   65      17              smooth and heterogenous         +----------+--------+--------+--------+-----------------------+--------+ CCA Distal81      24              smooth and heterogenous         +----------+--------+--------+--------+-----------------------+--------+ ICA Prox  232     56      40-59%  calcific               tortuous +----------+--------+--------+--------+-----------------------+--------+ ICA Mid                                                  tortuous +----------+--------+--------+--------+-----------------------+--------+ ICA Distal53      11                                     tortuous +----------+--------+--------+--------+-----------------------+--------+ ECA       193     41                                              +----------+--------+--------+--------+-----------------------+--------+ +----------+--------+--------+--------+-------------------+           PSV cm/sEDV cm/sDescribeArm Pressure (mmHG) +----------+--------+--------+--------+-------------------+ Subclavian204                                         +----------+--------+--------+--------+-------------------+ +---------+--------+---+--------+--+---------+ VertebralPSV cm/s118EDV cm/s43Antegrade +---------+--------+---+--------+--+---------+   Summary: Right Carotid: Velocities in the right ICA are consistent with a 80-99%                stenosis. Left Carotid: Velocities in the left ICA are consistent  with a 40-59% stenosis. Vertebrals: Bilateral vertebral arteries demonstrate antegrade flow. *See table(s) above for measurements and observations.  Electronically signed by Curt Jews MD on 12/08/2019 at 6:48:33 PM.  Final    ECHOCARDIOGRAM LIMITED  Result Date: 12/26/2019    ECHOCARDIOGRAM LIMITED REPORT   Patient Name:   Garrett Hughes [facility] Date of Exam: 12/26/2019 Medical Rec #:  [id number]     Height:       64.0 in Accession #:    [id number]    Weight:       141.4 lb Date of Birth:  1941-09-01     BSA:          1.688 m Patient Age:    89 years      BP:           107/68 mmHg Patient Gender: M             HR:           96 bpm. Exam Location:  Inpatient Procedure: Limited Echo and Color Doppler Indications:    Chest pain 786.50/R07.9  History:        Patient has prior history of Echocardiogram examinations, most                 recent 12/25/2019. CAD, Prior CABG, Aortic Valve Disease,                 Arrythmias:Atrial Fibrillation; Risk Factors:Diabetes,                 Dyslipidemia and Hypertension. GERD.  Sonographer:    Clayton Lefort RDCS (AE) Referring Phys: [id number] [place]  1. Limited evaluation of pericardial effusion. Trivial effusion is present that is not clinically significant.  2. Left ventricular ejection fraction, by estimation, is 60 to 65%. The left ventricle has normal function. The left ventricle has no regional wall motion abnormalities.  3. Right ventricular systolic function is normal. The right ventricular size is mildly enlarged.  4. The inferior vena cava is dilated in size with <50% respiratory variability, suggesting right atrial pressure of 15 mmHg. FINDINGS  Left Ventricle: Left ventricular ejection fraction, by estimation, is 60 to 65%. The left ventricle has normal function. The left ventricle has no regional wall motion abnormalities. Abnormal (paradoxical) septal motion consistent with post-operative status. Right Ventricle: The right ventricular size is mildly enlarged.  No increase in right ventricular wall thickness. Right ventricular systolic function is normal. Pericardium: Trivial pericardial effusion is present. There is no evidence of cardiac tamponade. Aortic Valve: There is severe thickening of the aortic valve. There is severe calcifcation of the aortic valve. Venous: The inferior vena cava is dilated in size with less than 50% respiratory variability, suggesting right atrial pressure of 15 mmHg.  IVC IVC diam: 2.30 cm Eleonore Chiquito MD Electronically signed by Eleonore Chiquito MD Signature Date/Time: 12/26/2019/11:58:48 AM    Final    ECHOCARDIOGRAM LIMITED  Result Date: 12/24/2019    ECHOCARDIOGRAM LIMITED REPORT   Patient Name:   Garrett Hughes [facility] Date of Exam: 12/24/2019 Medical Rec #:  [id number]     Height:       65.0 in Accession #:    [id number]    Weight:       142.0 lb Date of Birth:  [birth year]     BSA:          1.710 m Patient Age:    76 years      BP:           105/63 mmHg Patient Gender: M             HR:  53 bpm. Exam Location:  Inpatient Procedure: Limited Echo Indications:    Pericardial effusion 423.9 / I31.3  History:        Patient has prior history of Echocardiogram examinations, most                 recent 10/19/2019. CAD, Prior CABG, Aortic Valve Disease,                 Arrythmias:Atrial Fibrillation, Signs/Symptoms:Chest Pain; Risk                 Factors:Diabetes, Dyslipidemia and Hypertension. GERD.  Sonographer:    Darlina Sicilian RDCS Referring Phys: Glenetta Hew MD  Sonographer Comments: Echo performed during CATH to check for pericardial effusion IMPRESSIONS  1. Limited echo for pericardial effusion Patient is post atherectomy of RCA with perforation covered with stent. No significant pericardial effusion is seen. FINDINGS  Additional Comments: Limited echo for pericardial effusion Patient is post atherectomy of RCA with perforation covered with stent. No significant pericardial effusion is seen. Jenkins Rouge MD Electronically signed by Jenkins Rouge MD Signature Date/Time: 12/24/2019/2:56:07 PM    Final    CT ANGIO ABDOMEN PELVIS  W &/OR WO CONTRAST  Result Date: 12/08/2019 CLINICAL DATA:  79 year old male with history of severe aortic stenosis. Preprocedural study prior to potential transcatheter aortic valve replacement (TAVR) procedure. EXAM: CT ANGIOGRAPHY CHEST, ABDOMEN AND PELVIS TECHNIQUE: Multidetector CT imaging through the chest, abdomen and pelvis was performed using the standard protocol during bolus administration of intravenous contrast. Multiplanar reconstructed images and MIPs were obtained and reviewed to evaluate the vascular anatomy. CONTRAST:  150m OMNIPAQUE IOHEXOL 350 MG/ML SOLN COMPARISON:  CT the abdomen and pelvis 02/18/2012. FINDINGS: CTA CHEST FINDINGS Cardiovascular: Heart size is mildly enlarged. There is no significant pericardial fluid, thickening or pericardial calcification. There is aortic atherosclerosis, as well as atherosclerosis of the great vessels of the mediastinum and the coronary arteries, including calcified atherosclerotic plaque in the left main, left anterior descending, left circumflex and right coronary arteries. Status post median sternotomy for CABG including LIMA to the LAD. Severe thickening and calcification of the aortic valve. Calcifications of the mitral annulus and mitral valve. Mediastinum/Lymph Nodes: No pathologically enlarged mediastinal or hilar lymph nodes. Moderate hiatal hernia. No axillary lymphadenopathy. Lungs/Pleura: Widespread areas of mild ground-glass attenuation and interlobular septal thickening noted throughout the lungs bilaterally, favored to reflect a background of mild pulmonary edema. Trace bilateral pleural effusions lying dependently (right greater than left). No confluent consolidative airspace disease. No suspicious appearing pulmonary nodules or masses are noted. Musculoskeletal/Soft Tissues: Median sternotomy wires. There are no aggressive appearing lytic or  blastic lesions noted in the visualized portions of the skeleton. Multiple vertebral body compression fractures at the T3, T7 and T8, most severe at T3 where there is 30% loss of anterior vertebral body height. CTA ABDOMEN AND PELVIS FINDINGS Hepatobiliary: Liver has a nodular contour, which may suggest early changes of cirrhosis. No suspicious cystic or solid hepatic lesions. No intra or extrahepatic biliary ductal dilatation. Gallbladder is normal in appearance. Pancreas: Diffuse pancreatic atrophy. No pancreatic mass. No pancreatic ductal dilatation. No pancreatic or peripancreatic fluid collections or inflammatory changes. Spleen: Unremarkable. Adrenals/Urinary Tract: 1.7 cm low-attenuation lesion in the lower pole the right kidney, compatible with a simple cyst. Left kidney and bilateral adrenal glands are normal in appearance. No hydroureteronephrosis. Urinary bladder is normal in appearance. Stomach/Bowel: Normal appearance of the intra-abdominal portion of the stomach. No pathologic dilatation of small bowel or  colon. Numerous colonic diverticulae are noted, without definite surrounding inflammatory changes to suggest an acute diverticulitis at this time. Normal appendix. Vascular/Lymphatic: Aortic atherosclerosis, with vascular findings and measurements pertinent to potential TAVR procedure, as detailed below. No aneurysm or dissection noted in the abdominal or pelvic vasculature. No lymphadenopathy noted in the abdomen or pelvis. Reproductive: Prostate gland and seminal vesicles are unremarkable in appearance. Other: Trace volume of ascites noted in the visualized portions of the peritoneal cavity. No pneumoperitoneum. Musculoskeletal: Compression fractures are noted at L1 and L4, with post vertebroplasty changes at L4. There are no aggressive appearing lytic or blastic lesions noted in the visualized portions of the skeleton. VASCULAR MEASUREMENTS PERTINENT TO TAVR: AORTA: Minimal Aortic Diameter-14 x 11  mm Severity of Aortic Calcification-severe RIGHT PELVIS: Right Common Iliac Artery - Minimal Diameter-6.1 x 4.2 mm Tortuosity-mild-to-moderate Calcification-severe Right External Iliac Artery - Minimal Diameter-7.7 x 2.9 mm Tortuosity-moderate Calcification-moderate Right Common Femoral Artery - Minimal Diameter-6.7 x 1.7 mm Tortuosity-mild Calcification-moderate LEFT PELVIS: Left Common Iliac Artery - Minimal Diameter-3.7 x 2.1 mm Tortuosity-mild-to-moderate Calcification-severe Left External Iliac Artery - Minimal Diameter-5.7 x 7.2 mm Tortuosity-moderate Calcification-mild-to-moderate Left Common Femoral Artery - Minimal Diameter-7.7 x 3.2 mm Tortuosity-mild Calcification-moderate Review of the MIP images confirms the above findings. IMPRESSION: 1. Vascular findings and measurements pertinent to potential TAVR procedure, as detailed above. 2. Severe thickening calcification of the aortic valve, compatible with reported clinical history of severe aortic stenosis. 3. Aortic atherosclerosis, in addition to left main and 3 vessel coronary artery disease. Status post median sternotomy for CABG including LIMA to the LAD. 4. Mild cardiomegaly with findings suggestive of mild congestive heart failure, as above. 5. The appearance of the liver suggests underlying cirrhosis. 6. Diffuse pancreatic atrophy. 7. Severe colonic diverticulosis without evidence of acute diverticulitis at this time. 8. Additional incidental findings, as above. Electronically Signed   By: Vinnie Langton M.D.   On: 12/08/2019 14:09   Disposition   Pt is being discharged home today in good condition.  Follow-up Plans & Appointments    Follow-up Information    Lendon Colonel, NP. Go on 01/03/2020.   Specialties: Nurse Practitioner, Radiology, Cardiology Why: @ 11:15 am. please arrive at least 10 minutes early Contact information: [address] [place] [address] [phone]            Discharge Medications    Allergies as of 12/30/2019      Reactions   Atorvastatin    Other reaction(s): Myalgias (intolerance)   Cholestyramine Other (See Comments)   Constipation   Pravastatin Other (See Comments)   Does not tolerate high doses      Medication List    STOP taking these medications   doxazosin 8 MG tablet Commonly known as: CARDURA   lisinopril 5 MG tablet Commonly known as: ZESTRIL     TAKE these medications   Accu-Chek Aviva Plus test strip Generic drug: glucose blood Use as instructed to check blood sugar up to qid. Dx Code E11.22. Disp generic per pt preference / insurance coverage   Accu-Chek Aviva Plus w/Device Kit Use as instructed to check blood sugar up to qid. Dx code E11.22   Accu-Chek Aviva Soln Use as instructed per manufacturer directions. Dx code E11.22   Alcohol Swabs Pads Use as instructed to check blood sugar up to qid. Dx code E11.22   AMBULATORY NON FORMULARY MEDICATION Medication Name: accu-chek aviva plus meter, accu-check aviva plus test strips & accu-chek softclix lancets to test twice  a day. Dx: E11.29   aspirin 81 MG chewable tablet Chew 81 mg by mouth daily.   clopidogrel 75 MG tablet Commonly known as: PLAVIX TAKE 1 TABLET BY MOUTH  DAILY   ferrous sulfate 325 (65 FE) MG EC tablet Take 1 tablet (325 mg total) by mouth daily with breakfast.   furosemide 40 MG tablet Commonly known as: LASIX Take 0.5 tablets (20 mg total) by mouth daily.   glipiZIDE 5 MG tablet Commonly known as: GLUCOTROL TAKE 1 TABLET BY MOUTH  TWICE A DAY BEFORE MEALS What changed:   how much to take  how to take this  when to take this  additional instructions   metFORMIN 1000 MG tablet Commonly known as: GLUCOPHAGE TAKE 1 TABLET BY MOUTH  TWICE DAILY WITH A MEAL What changed:   how much to take  how to take this  when to take this  additional instructions   metoprolol succinate 25 MG 24 hr tablet Commonly known as: TOPROL-XL TAKE 1 TABLET BY  MOUTH  DAILY   multivitamin with minerals Tabs tablet Take 1 tablet by mouth daily.   nitroGLYCERIN 0.4 MG SL tablet Commonly known as: NITROSTAT Place 1 tablet (0.4 mg total) under the tongue every 5 (five) minutes as needed for chest pain.   onetouch ultrasoft lancets Use as instructed to check blood sugar up to qid. Dx: E11.22   Accu-Chek Softclix Lancets lancets Use as instructed to check blood sugar up to qid. Dx Code E11.22.   pantoprazole 20 MG tablet Commonly known as: PROTONIX TAKE 1 TABLET BY MOUTH  DAILY   pravastatin 40 MG tablet Commonly known as: PRAVACHOL TAKE 1 TABLET BY MOUTH  DAILY            Durable Medical Equipment  (From admission, onward)         Start     Ordered   12/30/19 1110  For home use only DME oxygen  Once    Question Answer Comment  Length of Need 12 Months   Mode or (Route) Nasal cannula   Frequency Continuous (stationary and portable oxygen unit needed)   Oxygen delivery system Gas      12/30/19 1109            Outstanding Labs/Studies   none  Duration of Discharge Encounter   Greater than 30 minutes including physician time.  SignedAngelena Form, PA-C 12/30/2019, 11:10 AM [phone]  Patient seen, examined. Available data reviewed. Agree with findings, assessment, and plan as outlined by Nell Range, PA-C.  The patient is independently interviewed and examined.  He is alert, oriented, elderly male in no distress.  Lung fields are clear, JVP is normal, heart is irregularly irregular with grade 3/6 harsh late peaking systolic murmur at the right upper sternal border, no diastolic murmur, abdomen soft nontender, extremities have no edema.  Telemetry is reviewed and shows atrial fibrillation with good rate control.  He has one 2-1/2-second pause that was asymptomatic.  He has diuresed well and is clinically improved.  His oxygen saturations are remaining above 90% on room air, but he will be discharged with oxygen as  needed.  He will continue his current medical program.  We have stopped lisinopril and Cardura in order to avoid symptomatic hypotension.  He will see Dr. Roxy Manns in the near future as part of a multidisciplinary heart team evaluation with plans for TAVR in the near future.  He is clinically improved from a perspective of his acute on  chronic diastolic heart failure.  We will perform outpatient telemetry to continue to monitor his heart rhythm.  Sherren Mocha, M.D. 12/30/2019 3:13 PM

## 2019-12-30 NOTE — Consult Note (Signed)
   [facility] CM Inpatient Consult   12/30/2019  Garrett Hughes 1941-06-25 [id number]   Patient screened for unplanned readmission with less than 7 days readmission hospitalizations.  Patient's progress notes reviewed to check if potential [facility] Care Management service needs.  Review of patient's medical record reveals patient is noted from history and physical notes from 12/28/19 which includes but not limited to, as follows:  Garrett Hughes is a 79 y.o. male with a hx of CABG 1997, chronic atrial fibrillation (pt declines anticoagulation), aortic stenosis,carotid artery disease,HTN, DM, HLD (intolerant of higher dose of pravastatin or atorvastatin), chronic appearing anemia/thrombocytopenia, GERD, Barrett's esophagus, h/o GIB and urinary tract bleeding, who is being seen today for the evaluation of CHF, syncope, at the request of Dr Elesa Massed.  12:15 Spoke with patient on the hospital phone and explained who [facility] Care Management services involvement with [facility] Medicare for medication follow up as well,  and confirms Dr. Lyn Hollingshead as primary care provider. He states "no needs right now."  Made patient aware of post hospital automated calls [with EMMI Discharge] maybe answered by [facility] nurse and he verbalized understanding and states it's alright to speak with wife on [phone] number.  Primary Care Provider is listed with Sunnie Nielsen of [facility]  Pharmacy is: [facility]  Plan: No current needs verbalized in addition to EMMI follow up calls.  Please place a [facility] Care Management consult as appropriate and for questions contact:   Charlesetta Shanks, RN BSN CCM [facility]  [phone] business mobile phone Toll free office [phone]  Fax number: [phone] [email or web address]

## 2019-12-30 NOTE — Telephone Encounter (Signed)
Calling to give zio patch results.

## 2019-12-31 DIAGNOSIS — R55 Syncope and collapse: Secondary | ICD-10-CM | POA: Diagnosis not present

## 2019-12-31 NOTE — Telephone Encounter (Signed)
zio patch placed at discharge for pauses seen on monitor while in the hospital. Will continue to monitor.

## 2020-01-01 NOTE — Progress Notes (Signed)
Cardiology Office Note   Date:  01/03/2020   ID:  Garrett Hughes, DOB October 10, 1940, MRN [id number]  PCP:  Emeterio Reeve, DO  Cardiologist:  L[place] CC:Hosptial Follow up   History of Present Illness: Garrett Hughes is a 79 y.o. male who presents for post hospitalization.  He has a complicated cardiac history, CABG in 1997, chronic atrial fibrillation (patient denies anticoagulation) severe aortic valve stenosis, right bundle branch block, coronary artery disease, hypertension, hyperlipidemia, with noncardiac history to include diabetes, chronic appearing anemia/thrombocytopenia, GERD, Barrett's esophagus, and history of GI bleed.  He initially presented to ED on 12/28/2019 with syncope.  He had had more dyspnea on exertion with moderate activities.  Echocardiogram February 2021 revealed normal LV function with severe atrial enlargement moderate mitral regurg moderate tricuspid regurg and severe aortic valve stenosis with a mean gradient of 39 mmHg with mild to moderate aortic insufficiency.  He underwent cardiac catheterization both right and left which revealed severe multivessel coronary artery disease with occlusion of the left coronary artery and severe calcific stenosis of the right coronary artery and continued patency of the LIMA to LAD graft and SVG to OM, atretic RIMA to RCA.  He was scheduled for an atherectomy of the RCA.  Repeat catheterization on 12/24/2019 with plan arthrectomy, with successful orbital atherectomy and stenting of the RCA with 3 overlapping drug-eluting stents.  This was complicated by a small coronary artery perforation that required covered stent placement.  He was continued on aspirin 81 mg and clopidogrel 75 mg long-term.  Unfortunately he also had outpatient carotid studies demonstrating 80% to 99% RCA and 11% to 94% LICA stenosis which required vascular surgery consultation.  He was planned to have a CT angio of the neck to see if he was a candidate for carotid  artery intervention.  However they wish to wait until the patient had aortic valve repair.  His syncope was thought to be related to aortic valve stenosis, atrial fibrillation, and dehydration from diarrhea.  He was found to be orthostatic.  Lisinopril and doxazosin have been held.  During hospitalization telemetry revealed one 2.3-second pause that was asymptomatic.  A ZIO monitor was planned at discharge.  He was also found to have acute on chronic diastolic heart failure with work-up revealing bilateral pleural effusions, perihilar edema on chest x-ray and elevated BNP.  He was diuresed with IV furosemide.  Repeat chest x-ray revealed continued pulmonary vascular congestion and therefore he was given a second dose of IV Lasix.  He was discharged on Lasix 20 mg daily.  Due to frequent desaturations while ambulating he was discharged on home oxygen.  Concerning aortic valve disease and need for evaluation for TAVR, he was to follow-up with the multidisciplinary team and meet with Dr. Roxy Manns.  He continues to refuse anticoagulation but would continue aspirin and Plavix.  He and his wife come today with multiple questions concerning his recent hospitalization.  He is without any complaints of chest pain, fluid retention, shortness of breath, or recurrence of syncopal episode which led him to hospitalization.  He is medically compliant.  He is due to follow-up with CVTS on Jan 10, 2020.  Past Medical History:  Diagnosis Date  . Anemia   . Atherosclerosis of native coronary artery of native heart without angina pectoris 01/05/2016  . Benign essential hypertension 01/05/2016  . Carotid artery disease ([place])   . Cataract, nuclear, right 05/06/2014  . Chronic atrial fibrillation ([place]) 12/20/2015  . Current use of long term  anticoagulation 12/20/2015  . Diabetic kidney disease ([place]) 01/05/2016  . GERD (gastroesophageal reflux disease)   . GI bleed   . History of Barrett's esophagus 12/20/2015  . History of  blood in urine   . 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 Hyperlipemia 10/26/2015  . Low back pain 05/02/2016  . Severe aortic stenosis   . Thrombocytopenia ([place])   . Type 2 diabetes mellitus with kidney complication, without long-term current use of insulin ([place]) 07/03/2016    Past Surgical History:  Procedure Laterality Date  . BACK SURGERY  2004  . [place]  . CORONARY ATHERECTOMY N/A 12/24/2019   Procedure: CORONARY ATHERECTOMY;  Surgeon: Sherren Mocha, MD;  Location: [place] [facility];  Service: Cardiovascular;  Laterality: N/A;  . RIGHT HEART CATH AND CORONARY/GRAFT ANGIOGRAPHY N/A 11/26/2019   Procedure: RIGHT HEART CATH AND CORONARY/GRAFT ANGIOGRAPHY;  Surgeon: Sherren Mocha, MD;  Location: [place] [facility];  Service: Cardiovascular;  Laterality: N/A;  . SHOULDER SURGERY  2005     Current Outpatient Medications  Medication Sig Dispense Refill  . ACCU-CHEK SOFTCLIX LANCETS lancets Use as instructed to check blood sugar up to qid. Dx Code E11.22. 100 each 12  . Alcohol Swabs PADS Use as instructed to check blood sugar up to qid. Dx code E11.22 100 each 1  . AMBULATORY NON FORMULARY MEDICATION Medication Name: accu-chek aviva plus meter, accu-check aviva plus test strips & accu-chek softclix lancets to test twice a day. Dx: E11.29 1 vial PRN  . aspirin 81 MG chewable tablet Chew 81 mg by mouth daily.     . Blood Glucose Calibration (ACCU-CHEK AVIVA) SOLN Use as instructed per manufacturer directions. Dx code E11.22 1 each 0  . Blood Glucose Monitoring Suppl (ACCU-CHEK AVIVA PLUS) w/Device KIT Use as instructed to check blood sugar up to qid. Dx code E11.22 1 kit 0  . clopidogrel (PLAVIX) 75 MG tablet TAKE 1 TABLET BY MOUTH  DAILY (Patient taking differently: Take 75 mg by mouth daily. ) 90 tablet 3  . ferrous sulfate 325 (65 FE) MG EC tablet Take 1 tablet (325 mg total) by mouth daily with breakfast. 90 tablet 3  . furosemide  (LASIX) 40 MG tablet Take 0.5 tablets (20 mg total) by mouth daily.    Marland Kitchen glipiZIDE (GLUCOTROL) 5 MG tablet TAKE 1 TABLET BY MOUTH  TWICE A DAY BEFORE MEALS (Patient taking differently: Take 5 mg by mouth 2 (two) times daily before a meal. ) 180 tablet 1  . glucose blood (ACCU-CHEK AVIVA PLUS) test strip Use as instructed to check blood sugar up to qid. Dx Code E11.22. Disp generic per pt preference / insurance coverage 100 each 99  . Lancets (ONETOUCH ULTRASOFT) lancets Use as instructed to check blood sugar up to qid. Dx: E11.22 100 each 99  . metFORMIN (GLUCOPHAGE) 1000 MG tablet TAKE 1 TABLET BY MOUTH  TWICE DAILY WITH A MEAL (Patient taking differently: Take 1,000 mg by mouth 2 (two) times daily with a meal. ) 180 tablet 1  . metoprolol succinate (TOPROL-XL) 25 MG 24 hr tablet TAKE 1 TABLET BY MOUTH  DAILY (Patient taking differently: Take 25 mg by mouth daily. ) 90 tablet 1  . Multiple Vitamin (MULTIVITAMIN WITH MINERALS) TABS tablet Take 1 tablet by mouth daily.    . nitroGLYCERIN (NITROSTAT) 0.4 MG SL tablet Place 1 tablet (0.4 mg total) under the tongue every 5 (five)  minutes as needed for chest pain. 30 tablet 1  . pantoprazole (PROTONIX) 20 MG tablet TAKE 1 TABLET BY MOUTH  DAILY (Patient taking differently: Take 20 mg by mouth daily. ) 90 tablet 1  . pravastatin (PRAVACHOL) 40 MG tablet TAKE 1 TABLET BY MOUTH  DAILY (Patient taking differently: Take 40 mg by mouth daily. ) 90 tablet 1   No current facility-administered medications for this visit.    Allergies:   Atorvastatin, Cholestyramine, and Pravastatin    Social History:  The patient  reports that he quit smoking about 32 years ago. He has never used smokeless tobacco. He reports that he does not drink alcohol or use drugs.   Family History:  The patient's family history includes CAD in his father.    ROS: All other systems are reviewed and negative. Unless otherwise mentioned in H&P    PHYSICAL EXAM: VS:  BP 138/68   Pulse  82   Ht '5\' 4"'  (1.626 m)   Wt 145 lb 9.6 oz (66 kg)   SpO2 95%   BMI 24.99 kg/m  , BMI Body mass index is 24.99 kg/m. GEN: Well nourished, well developed, in no acute distress HEENT: normal Neck: no JVD, radiation carotid bruits, or masses Cardiac: RRR; harsh 3/6 systolic murmur radiating to the carotids bilaterally murmurs, rubs, or gallops,no edema  Respiratory:  Clear to auscultation bilaterally, normal work of breathing GI: soft, nontender, nondistended, + BS MS: no deformity or atrophy Skin: warm and dry, no rash Neuro:  Strength and sensation are intact Psych: euthymic mood, full affect   EKG: Not completed this office visit.  Recent Labs: 12/28/2019: ALT 42; B Natriuretic Peptide 165.1; Hemoglobin 8.7; Magnesium 1.7; Platelets 112; TSH 3.429 12/30/2019: BUN 21; Creatinine, Ser 1.14; Potassium 4.1; Sodium 140    Lipid Panel    Component Value Date/Time   CHOL 145 09/21/2019 0922   TRIG 71 09/21/2019 0922   HDL 63 09/21/2019 0922   CHOLHDL 2.3 09/21/2019 0922   VLDL 11 05/06/2017 1101   LDLCALC 67 09/21/2019 0922      Wt Readings from Last 3 Encounters:  01/03/20 145 lb 9.6 oz (66 kg)  12/30/19 136 lb 3.2 oz (61.8 kg)  12/26/19 141 lb 6.4 oz (64.1 kg)      Other studies Reviewed: Limited echo 12/26/19 IMPRESSIONS  1. Limited evaluation of pericardial effusion. Trivial effusion is  present that is not clinically significant.  2. Left ventricular ejection fraction, by estimation, is 60 to 65%. The  left ventricle has normal function. The left ventricle has no regional  wall motion abnormalities.  3. Right ventricular systolic function is normal. The right ventricular  size is mildly enlarged.  4. The inferior vena cava is dilated in size with <50% respiratory  variability, suggesting right atrial pressure of 15 mmHg.    LHC 11/26/2019 1.  Severe multivessel coronary artery disease with occlusion of the left coronary artery and severe calcific stenosis of  the right coronary artery 2.  Status post aortocoronary bypass surgery with continued patency of the LIMA to LAD graft and saphenous vein graft OM, atretic RIMA to RCA 3.  Known severe aortic stenosis by noninvasive assessment.  Coronary Atherectomy 12/24/2019 Successful orbital atherectomy and stenting of the RCA with 3 overlapping DES, complicated by small coronary perforation requiring covered stent placement (4.0x20 mm Papyrus)  ASSESSMENT AND PLAN:  1.  Severe aortic valve stenosis: He did undergo cardiac catheterization with work-up during recent hospitalization.  He is to  follow with CVTS and the heart valve team for ongoing treatment plan and management.  No changes in his medication at this time.  2.  CAD: Cardiac catheterization both right and left which revealed severe multivessel coronary artery disease with occlusion of the left coronary artery and severe calcific stenosis of the right coronary artery with continued patency of the LIMA to LAD graft and SVG to OM, with atretic RIMA to RCA.  He did have a arthrectomy of the RCA on 12/24/2019 with stenting of the RCA using 3 overlapping drug-eluting stents.  He continues on aspirin and clopidogrel.  No evidence of bleeding or excessive bruising.  He denies recurrent discomfort in his chest.  3.  Carotid artery disease: Outpatient carotid studies demonstrated 80% to 99% R ICA and 55% to 01% LICA stenosis.  He was to have a planned CT angio of the neck to see if he would be a candidate for carotid artery intervention however vein and vascular recommended that he have aortic valve repair planned first.  Follow-up appointments after being seen by heart valve team.  4.  History of 2-second pause: This was noted during hospitalization.  He is wearing a Zio cardiac monitor.  Results once monitor has been completed.  He has offered no complaints of syncope, or near syncope since being released from the hospital.   Current medicines are reviewed at  length with the patient today.  I have spent 45 minutes  dedicated to the care of this patient on the date of this encounter to include pre-visit review of records, assessment, management and diagnostic testing,with shared decision making.  Labs/ tests ordered today include: None Phill Myron. West Pugh, ANP, AACC   01/03/2020 1:36 PM    [facility] [address] [address] Office [phone] Fax [phone]  Notice: This dictation was prepared with Dragon dictation along with smaller phrase technology. Any transcriptional errors that result from this process are unintentional and may not be corrected upon review.

## 2020-01-03 ENCOUNTER — Other Ambulatory Visit: Payer: Self-pay

## 2020-01-03 ENCOUNTER — Encounter: Payer: Self-pay | Admitting: Adult Health

## 2020-01-03 ENCOUNTER — Ambulatory Visit: Payer: Medicare Other | Admitting: Adult Health

## 2020-01-03 ENCOUNTER — Encounter: Payer: Medicare Other | Admitting: Thoracic Surgery (Cardiothoracic Vascular Surgery)

## 2020-01-03 VITALS — BP 138/68 | HR 82 | Ht 64.0 in | Wt 145.6 lb

## 2020-01-03 DIAGNOSIS — Z951 Presence of aortocoronary bypass graft: Secondary | ICD-10-CM | POA: Diagnosis not present

## 2020-01-03 DIAGNOSIS — I2510 Atherosclerotic heart disease of native coronary artery without angina pectoris: Secondary | ICD-10-CM | POA: Diagnosis not present

## 2020-01-03 DIAGNOSIS — I455 Other specified heart block: Secondary | ICD-10-CM | POA: Diagnosis not present

## 2020-01-03 DIAGNOSIS — I6529 Occlusion and stenosis of unspecified carotid artery: Secondary | ICD-10-CM | POA: Diagnosis not present

## 2020-01-03 DIAGNOSIS — I350 Nonrheumatic aortic (valve) stenosis: Secondary | ICD-10-CM | POA: Diagnosis not present

## 2020-01-03 NOTE — Patient Instructions (Signed)
Medication Instructions:  Continue current medications  *If you need a refill on your cardiac medications before your next appointment, please call your pharmacy*   Lab Work: None Ordered   Testing/Procedures: None Ordered   Follow-Up: At [facility], you and your health needs are our priority.  As part of our continuing mission to provide you with exceptional heart care, we have created designated Provider Care Teams.  These Care Teams include your primary Cardiologist (physician) and Advanced Practice Providers (APPs -  Physician Assistants and Nurse Practitioners) who all work together to provide you with the care you need, when you need it.  We recommend signing up for the patient portal called "MyChart".  Sign up information is provided on this After Visit Summary.  MyChart is used to connect with patients for Virtual Visits (Telemedicine).  Patients are able to view lab/test results, encounter notes, upcoming appointments, etc.  Non-urgent messages can be sent to your provider as well.   To learn more about what you can do with MyChart, go to [email or web address].      

## 2020-01-04 DIAGNOSIS — I5033 Acute on chronic diastolic (congestive) heart failure: Secondary | ICD-10-CM | POA: Diagnosis not present

## 2020-01-04 DIAGNOSIS — I2510 Atherosclerotic heart disease of native coronary artery without angina pectoris: Secondary | ICD-10-CM | POA: Diagnosis not present

## 2020-01-05 ENCOUNTER — Encounter: Payer: Self-pay | Admitting: *Deleted

## 2020-01-05 ENCOUNTER — Other Ambulatory Visit: Payer: Self-pay | Admitting: *Deleted

## 2020-01-05 NOTE — Patient Outreach (Signed)
[facility] Customer service manager [facility]) Care Management [facility] CM Telephone Outreach, [facility] Red Alert notification/ General Discharge Post-hospital discharge day # 6  01/05/2020  SQUIRE WITHEY 1941/07/24 [id number]  EMMI Red Alert notification/ General Discharge EMMI call date/ day #: Tuesday January 04, 2020; day # 4 Red-Alert reason(s): "Lost interest in things;" "Sad/ depressed/ anxious/ hopeless"  Successful telephone outreach to [employer], 79 y/o male referred to [facility] RN CM this morning by [facility] CMA for EMMI Red-Alert notification as above; patient had recent hospitalization April 20-22, 2021 for syncope/ diarrhea/ and bilateral pleural effusions post elective cardiac catherization.  Patient was discharged home to self-care without home health services in place.  Patient has history including, but not limited to, dCHF; Atrial Fibrillation; Aortic stenosis; CAD with previous CABG; HTN/ HLD; DM- Type II; and GERD.  HIPAA/ identity verified with patient and purpose of call discussed; noted that patient was outreached by [facility] RN [facility] Liaison at time of recent hospitalization, and declined services.  Patient agrees to complete "a few minutes" of screening for EMMI red-alert notification stating time constraints, but again declines ongoing [facility] RN CM program participation, stating that he is "completely independent" and "has no" care coordination/ care management needs.  Today, patient reports "doing great, except my allergies are giving me a fit;" he denies clinical concerns, pain, new/ recent falls, and sounds to be in no distress throughout phone call today.  Discussed reason for EMMI red-Alert notification and patient adamantly denies red-alert reasons today stating that computerized call "did not understand me;" depression screening completed today and no concerns were identified.  Patient further reports:  -- has and is taking all medications as prescribed; denies concerns/ questions/ issues/  problems around medications -- SDOH completed for transportation/ depression/ food insecurity and no concerns were identified by patient or by screening process -- has Advanced Directives for HCPOA/ Living Will, does not wish to make changes; endorses self as FULL code status -- has already had follow up provider appointments which he attended, and has scheduled upcoming PCP provider appointment "next week;" verbalizes plans to attend as scheduled  Patient denies further issues, concerns, or problems today.  I provided/ confirmed that patient has my direct phone number, the main [facility] CM office phone number, and the [facility] CM 24-hour nurse advice phone number should he change his mind and wish to participate in [facility] CM program in the future.  Plan:  Will close EMMI Red-Alert case as patient declines ongoing participation in [facility] CM program and verbalizes no care management/ care coordination needs, and will make patient's PCP aware of same  Caryl Pina, RN, BSN, CCRN Alumnus [facility] Coordinator [facility] Care Management  [phone]

## 2020-01-10 ENCOUNTER — Encounter: Payer: Self-pay | Admitting: Thoracic Surgery (Cardiothoracic Vascular Surgery)

## 2020-01-10 ENCOUNTER — Other Ambulatory Visit: Payer: Self-pay

## 2020-01-10 ENCOUNTER — Other Ambulatory Visit: Payer: Self-pay | Admitting: Osteopathic Medicine

## 2020-01-10 ENCOUNTER — Institutional Professional Consult (permissible substitution): Payer: Medicare Other | Admitting: Thoracic Surgery (Cardiothoracic Vascular Surgery)

## 2020-01-10 VITALS — BP 142/69 | HR 87 | Temp 98.7°F | Resp 20 | Ht 64.0 in | Wt 147.0 lb

## 2020-01-10 DIAGNOSIS — I25119 Atherosclerotic heart disease of native coronary artery with unspecified angina pectoris: Secondary | ICD-10-CM

## 2020-01-10 DIAGNOSIS — Z951 Presence of aortocoronary bypass graft: Secondary | ICD-10-CM

## 2020-01-10 DIAGNOSIS — I350 Nonrheumatic aortic (valve) stenosis: Secondary | ICD-10-CM

## 2020-01-10 NOTE — Patient Instructions (Addendum)
Check your weight on a regular basis and keep a log for your records.  Look for signs of fluid overload such as worsening swelling of your lower legs, increased shortness of breath with activity, and/or a dry nonproductive cough.  Discussed these findings with your cardiologist including whether or not you should adjust your fluid pill dosage (diuretic).  Stop taking metformin 2 days prior to surgery.  Continue taking all other medications without change through the day before surgery.  Make sure to bring all of your medications with you when you come for your Pre-Admission Testing appointment at [facility] Short-Stay Department.  Have nothing to eat or drink after midnight the night before surgery.  On the morning of surgery take only Protonix with a sip of water.  At your appointment for Pre-Admission Testing at the [facility] Short-Stay Department you will be asked to sign permission forms for your upcoming surgery.  By definition your signature on these forms implies that you and/or your designee provide full informed consent for your planned surgical procedure(s), that alternative treatment options have been discussed, that you understand and accept any and all potential risks, and that you have some understanding of what to expect for your post-operative convalescence.  For any major cardiac surgical procedure potential operative risks include but are not limited to at least some risk of death, stroke or other neurologic complication, myocardial infarction, congestive heart failure, respiratory failure, renal failure, bleeding requiring blood transfusion and/or reexploration, irregular heart rhythm, heart block or bradycardia requiring permanent pacemaker, pneumonia, pericardial effusion, pleural effusion, wound infection, pulmonary embolus or other thromboembolic complication, chronic pain, or other complications related to the specific procedure(s)  performed.  Please call to schedule a follow-up appointment in our office prior to surgery if you have any unresolved questions about your planned surgical procedure, the associated risks, alternative treatment options, and/or expectations for your post-operative recovery.

## 2020-01-10 NOTE — H&P (View-Only) (Signed)
[facility]  CARDIOTHORACIC SURGERY CONSULTATION REPORT  Referring Provider is Garrett Mitchell, MD Primary Cardiologist is Garrett Ruths, MD PCP is Garrett Reeve, DO  Chief Complaint  Patient presents with  . Aortic Stenosis    Surgical eval for TAVR, review all studies and testing    HPI:  Patient is a 79 year old male with history of aortic stenosis, coronary artery disease status post coronary artery bypass grafting in 1997 and recently status post PCI and stenting of the right coronary artery, asymptomatic bilateral carotid artery stenosis with high-grade right internal carotid artery stenosis, longstanding persistent atrial fibrillation not currently on anticoagulation, hypertension, type 2 diabetes mellitus, Barrett's esophagus, GE reflux disease, thrombocytopenia, and hyperlipidemia who has been referred for surgical consultation to discuss treatment options for management of severe symptomatic aortic stenosis and multivessel coronary artery disease.  Patient's cardiac history dates back to 1997 when he first presented with coronary artery disease.  He underwent coronary artery bypass grafting x3 at [facility] in [place].  He has been followed for the last several years by Dr. Stanford Hughes with history of aortic stenosis, coronary artery disease, and persistent atrial fibrillation.  He was anticoagulated using warfarin for a period of time but this was stopped because of problems with GI bleeding and hematuria and difficulty maintaining therapeutic levels.  Patient has known history of aortic stenosis that has progressed on serial follow-up echocardiograms.  He also has a long history of exertional shortness of breath that has slowly progressed and become quite problematic over the last few months.  Recent echocardiogram performed October 19, 2019 revealed normal left ventricular systolic function with  severe aortic stenosis, moderate aortic insufficiency and moderate mitral regurgitation.  He was seen in follow-up by Dr. Stanford Hughes and referred to the multidisciplinary heart valve clinic.  Diagnostic cardiac catheterization performed by Dr. Burt Hughes on November 26, 2019 revealed severe multivessel coronary artery disease with chronic occlusion of the left coronary artery and severe high-grade stenosis of the right coronary artery.  There was continued patency of left internal mammary artery to the left anterior descending coronary artery and greater saphenous vein graft to the obtuse marginal branch.  Right internal mammary artery graft previously placed to the right coronary artery was atretic.  The patient's case was reviewed by multidisciplinary team of specialists and the patient subsequently was brought back to the Cath Lab on December 24, 2019 for PCI and stenting of the right coronary artery.  This procedure was complicated by coronary perforation requiring placement of a covered stent.  Following the procedure the patient had 2 days of chest pain but serial echocardiograms revealed no significant pericardial effusion.  The patient was discharged from the hospital but readmitted December 28, 2019 following a syncopal episode that was felt likely to be related to his aortic stenosis, atrial fibrillation, and dehydration.  Medications were adjusted and a ZIO patch monitor placed.  Since hospital discharge the patient has been seen in follow-up on 1 occasion at [facility].  He was subsequently referred for surgical consultation.  Patient is married and lives locally in [place] with his wife and sister.  He has 2 adult children and 3 grandchildren.  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  He remains functionally independent but he complains that he has really been limited over the last few months due to worsening symptoms of exertional shortness of breath and fatigue.  He now  gets short of breath with low-level activity and occasionally at rest.  He has not had any chest pain or chest tightness since his most recent hospital discharge.  He has had some lower extremity edema.  Since hospital discharge weight has stabilized on current dosages of Lasix but he still gets short of breath quite easily.  He has not had any dizzy spells or repeat syncopal events since the event which prompted hospital admission on December 28, 2019.  Past Medical History:  Diagnosis Date  . Anemia   . Atherosclerosis of native coronary artery of native heart without angina pectoris 01/05/2016  . Benign essential hypertension 01/05/2016  . Carotid artery disease ([place])   . Cataract, nuclear, right 05/06/2014  . Chronic atrial fibrillation ([place]) 12/20/2015  . Current use of long term anticoagulation 12/20/2015  . Diabetic kidney disease ([place]) 01/05/2016  . GERD (gastroesophageal reflux disease)   . GI bleed   . History of Barrett's esophagus 12/20/2015  . History of blood in urine   . 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 Hyperlipemia 10/26/2015  . Low back pain 05/02/2016  . Severe aortic stenosis   . Thrombocytopenia ([place])   . Type 2 diabetes mellitus with kidney complication, without long-term current use of insulin ([place]) 07/03/2016    Past Surgical History:  Procedure Laterality Date  . BACK SURGERY  2004  . [place]  . CORONARY ATHERECTOMY N/A 12/24/2019   Procedure: CORONARY ATHERECTOMY;  Surgeon: Garrett Mocha, MD;  Location: [place] [facility];  Service: Cardiovascular;  Laterality: N/A;  . RIGHT HEART CATH AND CORONARY/GRAFT ANGIOGRAPHY N/A 11/26/2019   Procedure: RIGHT HEART CATH AND CORONARY/GRAFT ANGIOGRAPHY;  Surgeon: Garrett Mocha, MD;  Location: [place] [facility];  Service: Cardiovascular;  Laterality: N/A;  . SHOULDER SURGERY  2005    Family History  Problem Relation Age of Onset  . CAD Father   . Colon cancer Neg Hx    . Heart attack Neg Hx     Social History   Socioeconomic History  . Marital status: Married    Spouse name: Edd Fabian  . Number of children: 2  . Years of education: 5  . Highest education level: 11th grade  Occupational History  . Occupation: truck Geophysicist/field seismologist    Comment: retired  Tobacco Use  . Smoking status: Former Smoker    Quit date: 09/10/1987    Years since quitting: 32.3  . Smokeless tobacco: Never Used  Substance and Sexual Activity  . Alcohol use: No  . Drug use: No  . Sexual activity: Not Currently  Other Topics Concern  . 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 Keeps grandsons and granddaughter   Social Determinants of Health   Financial Resource Strain:   . Difficulty of Paying Living Expenses:   Food Insecurity: No Food Insecurity  . Worried About Charity fundraiser in the Last Year: Never true  . Ran Out of Food in the Last Year: Never true  Transportation Needs: No Transportation Needs  . Lack of Transportation (Medical): No  . Lack of Transportation (Non-Medical): No  Physical Activity:   . Days of Exercise per Week:   . Minutes of Exercise per Session:   Stress:   . Feeling of Stress :   Social Connections:   .  Frequency of Communication with Friends and Family:   . Frequency of Social Gatherings with Friends and Family:   . Attends Religious Services:   . Active Member of Clubs or Organizations:   . Attends Archivist Meetings:   Marland Kitchen Marital Status:   Intimate Partner Violence:   . Fear of Current or Ex-Partner:   . Emotionally Abused:   Marland Kitchen Physically Abused:   . Sexually Abused:     Current Outpatient Medications  Medication Sig Dispense Refill  . ACCU-CHEK SOFTCLIX LANCETS lancets Use as instructed to check blood sugar up to qid. Dx Code E11.22. 100 each 12  . Alcohol Swabs PADS Use as instructed to check blood sugar up to qid. Dx code E11.22  100 each 1  . AMBULATORY NON FORMULARY MEDICATION Medication Name: accu-chek aviva plus meter, accu-check aviva plus test strips & accu-chek softclix lancets to test twice a day. Dx: E11.29 1 vial PRN  . aspirin 81 MG chewable tablet Chew 81 mg by mouth daily.     . Blood Glucose Calibration (ACCU-CHEK AVIVA) SOLN Use as instructed per manufacturer directions. Dx code E11.22 1 each 0  . Blood Glucose Monitoring Suppl (ACCU-CHEK AVIVA PLUS) w/Device KIT Use as instructed to check blood sugar up to qid. Dx code E11.22 1 kit 0  . clopidogrel (PLAVIX) 75 MG tablet TAKE 1 TABLET BY MOUTH  DAILY (Patient taking differently: Take 75 mg by mouth daily. ) 90 tablet 3  . ferrous sulfate 325 (65 FE) MG EC tablet Take 1 tablet (325 mg total) by mouth daily with breakfast. 90 tablet 3  . furosemide (LASIX) 40 MG tablet Take 0.5 tablets (20 mg total) by mouth daily.    Marland Kitchen glipiZIDE (GLUCOTROL) 5 MG tablet TAKE 1 TABLET BY MOUTH  TWICE A DAY BEFORE MEALS (Patient taking differently: Take 5 mg by mouth 2 (two) times daily before a meal. ) 180 tablet 1  . glucose blood (ACCU-CHEK AVIVA PLUS) test strip Use as instructed to check blood sugar up to qid. Dx Code E11.22. Disp generic per pt preference / insurance coverage 100 each 99  . Lancets (ONETOUCH ULTRASOFT) lancets Use as instructed to check blood sugar up to qid. Dx: E11.22 100 each 99  . metFORMIN (GLUCOPHAGE) 1000 MG tablet TAKE 1 TABLET BY MOUTH  TWICE DAILY WITH A MEAL (Patient taking differently: Take 1,000 mg by mouth 2 (two) times daily with a meal. ) 180 tablet 1  . metoprolol succinate (TOPROL-XL) 25 MG 24 hr tablet TAKE 1 TABLET BY MOUTH  DAILY (Patient taking differently: Take 25 mg by mouth daily. ) 90 tablet 1  . Multiple Vitamin (MULTIVITAMIN WITH MINERALS) TABS tablet Take 1 tablet by mouth daily.    . nitroGLYCERIN (NITROSTAT) 0.4 MG SL tablet Place 1 tablet (0.4 mg total) under the tongue every 5 (five) minutes as needed for chest pain. 30 tablet 1   . pantoprazole (PROTONIX) 20 MG tablet TAKE 1 TABLET BY MOUTH  DAILY (Patient taking differently: Take 20 mg by mouth daily. ) 90 tablet 1  . pravastatin (PRAVACHOL) 40 MG tablet TAKE 1 TABLET BY MOUTH  DAILY (Patient taking differently: Take 40 mg by mouth daily. ) 90 tablet 1   No current facility-administered medications for this visit.    Allergies  Allergen Reactions  . Atorvastatin     Other reaction(s): Myalgias (intolerance)  . Cholestyramine Other (See Comments)    Constipation  . Pravastatin Other (See Comments)    Does not  tolerate high doses      Review of Systems:   General:  decreased appetite, decreased energy, no weight gain, no weight loss, no fever  Cardiac:  no chest pain with exertion, no chest pain at rest, +SOB with exertion, occasional resting SOB, no PND, + orthopnea, no palpitations, + arrhythmia, + atrial fibrillation, + LE edema, + dizzy spells, + syncope  Respiratory:  + shortness of breath, no home oxygen, no productive cough, no dry cough, no bronchitis, no wheezing, no hemoptysis, no asthma, no pain with inspiration or cough, no sleep apnea, no CPAP at night  GI:   no difficulty swallowing, + reflux, no frequent heartburn, no hiatal hernia, no abdominal pain, no constipation, + diarrhea, no hematochezia, no hematemesis, no melena  GU:   no dysuria,  no frequency, no urinary tract infection, no hematuria, no enlarged prostate, + kidney stones, no kidney disease  Vascular:  no pain suggestive of claudication, no pain in feet, no leg cramps, no varicose veins, no DVT, no non-healing foot ulcer  Neuro:   no stroke, no TIA's, no seizures, no headaches, no temporary blindness one eye,  no slurred speech, no peripheral neuropathy, no chronic pain, no instability of gait, no memory/cognitive dysfunction  Musculoskeletal: + arthritis, no joint swelling, no myalgias, no difficulty walking, NORMAL mobility   Skin:   no rash, no itching, no skin infections, no  pressure sores or ulcerations  Psych:   no anxiety, no depression, no nervousness, no unusual recent stress  Eyes:   no blurry vision, no floaters, no recent vision changes, + wears glasses or contacts  ENT:   no hearing loss, no loose or painful teeth, no dentures, last saw dentist 1 month ago  Hematologic:  no easy bruising, no abnormal bleeding, no clotting disorder, no frequent epistaxis  Endocrine:  + diabetes, does check CBG's at home           Physical Exam:   BP (!) 142/69 (BP Location: Right Arm, Patient Position: Sitting, Cuff Size: Normal)   Pulse 87   Temp 98.7 F (37.1 C) (Oral)   Resp 20   Ht _0  (1.626 m)   Wt 147 lb (66.7 kg)   SpO2 94% Comment: RA  BMI 25.23 kg/m   General:  Elderly,  well-appearing  HEENT:  Unremarkable   Neck:   no JVD, no bruits, no adenopathy   Chest:   Few bibasilar insp crackles c/w rales, symmetrical breath sounds, no wheezes, no rhonchi   CV:   Irregular rate and rhythm, grade IV/VI crescendo/decrescendo murmur heard best at RSB,  no diastolic murmur  Abdomen:  soft, non-tender, no masses   Extremities:  warm, well-perfused, pulses diminished but palpable, trace LE edema  Rectal/GU  Deferred  Neuro:   Grossly non-focal and symmetrical throughout  Skin:   Clean and dry, no rashes, no breakdown   Diagnostic Tests:   ECHOCARDIOGRAM REPORT       Patient Name:  Garrett Hughes Date of Exam: 10/19/2019  Medical Rec #: [id number]   Height:    64.0 in  Accession #:  [id number]  Weight:    144.0 lb  Date of Birth: [birth year]   BSA:     1.70 m  Patient Age:  15 years   BP:      111/60 mmHg  Patient Gender: M       HR:      77 bpm.  Exam Location: [place]   Procedure: 2D Echo,  Cardiac Doppler and Color Doppler   Indications:  Aortic Stenosis    History:    Patient has prior history of Echocardiogram examinations,  most         recent 10/13/2018.    Sonographer:   Cardell Peach RDCS (AE)  Referring Phys: [place]    1. Left ventricular ejection fraction, by estimation, is 55 to 60%. The  left ventricle has normal function. The left ventrical has no regional  wall motion abnormalities. There is mildly increased left ventricular  hypertrophy. Left ventricular diastolic  parameters are indeterminate.  2. Right ventricular systolic function is normal. The right ventricular  size is normal. There is moderately elevated pulmonary artery systolic  pressure.  3. Left atrial size was severely dilated.  4. The mitral valve is degenerative. Moderate mitral valve regurgitation.  5. Tricuspid valve regurgitation is moderate.  6. The aortic valve is bicuspid. Aortic valve regurgitation is mild to  moderate. Severe aortic valve stenosis.  7. The inferior vena cava is normal in size with <50% respiratory  variability, suggesting right atrial pressure of 8 mmHg.   FINDINGS  Left Ventricle: Left ventricular ejection fraction, by estimation, is 55  to 60%. The left ventricle has normal function. The left ventricle has no  regional wall motion abnormalities. The left ventricular internal cavity  size was normal in size. There is  mildly increased left ventricular hypertrophy. Concentric remodeling left  ventricular hypertrophy. Left ventricular diastolic parameters are  indeterminate.   Right Ventricle: The right ventricular size is normal. No increase in  right ventricular wall thickness. Right ventricular systolic function is  normal. There is moderately elevated pulmonary artery systolic pressure.  The tricuspid regurgitant velocity is  3.61 m/s, and with an assumed right atrial pressure of 8 mmHg, the  estimated right ventricular systolic pressure is 94.8 mmHg.   Left Atrium: Left atrial size was severely dilated.   Right Atrium: Right atrial size was normal in size.   Pericardium: There is no evidence of  pericardial effusion.   Mitral Valve: The mitral valve is degenerative in appearance. There is  moderate thickening of the anterior and posterior mitral valve leaflet(s).  There is mild calcification of the anterior mitral valve leaflet(s).  Normal mobility of the mitral valve  leaflets. Moderate mitral annular calcification. Moderate mitral valve  regurgitation. No evidence of mitral valve stenosis.   Tricuspid Valve: The tricuspid valve is normal in structure. Tricuspid  valve regurgitation is moderate . No evidence of tricuspid stenosis.   Aortic Valve: The aortic valve is bicuspid and severely retricted. Aortic  valve regurgitation is mild to moderate. Aortic regurgitation PHT measures  284 msec. Severe aortic stenosis is present. Mild aortic valve annular  calcification. There is moderate  thickening of the aortic valve. Aortic valve mean gradient measures 39.4  mmHg. Aortic valve peak gradient measures 59.4 mmHg. Aortic valve area, by  VTI measures 0.57 cm.   Pulmonic Valve: The pulmonic valve was normal in structure. Pulmonic valve  regurgitation is mild. No evidence of pulmonic stenosis.   Aorta: The aortic root is normal in size and structure.   Venous: The pulmonary veins were not well visualized. The inferior vena  cava is normal in size with less than 50% respiratory variability,  suggesting right atrial pressure of 8 mmHg. The inferior vena cava and the  hepatic vein show a normal flow pattern.   IAS/Shunts: No atrial level shunt detected by color flow Doppler.  LEFT VENTRICLE  PLAX 2D  LVIDd:     4.47 cm  LVIDs:     2.73 cm  LV PW:     1.23 cm  LV IVS:    1.38 cm  LVOT diam:   1.90 cm  LV SV:     55.00 ml  LV SV Index:  36.59  LVOT Area:   2.84 cm     RIGHT VENTRICLE      IVC  RV Basal diam: 3.35 cm  IVC diam: 1.70 cm  RV S prime:   9.68 cm/s  TAPSE (M-mode): 1.6 cm   LEFT ATRIUM       Index    RIGHT  ATRIUM      Index  LA diam:    4.70 cm 2.76 cm/m RA Area:   16.70 cm  LA Vol (A2C):  67.1 ml 39.44 ml/m RA Volume:  39.40 ml 23.16 ml/m  LA Vol (A4C):  86.4 ml 50.78 ml/m  LA Biplane Vol: 81.3 ml 47.78 ml/m  AORTIC VALVE  AV Area (Vmax):  0.61 cm  AV Area (Vmean):  0.56 cm  AV Area (VTI):   0.57 cm  AV Vmax:      385.40 cm/s  AV Vmean:     299.400 cm/s  AV VTI:      0.958 m  AV Peak Grad:   59.4 mmHg  AV Mean Grad:   39.4 mmHg  LVOT Vmax:     82.55 cm/s  LVOT Vmean:    59.000 cm/s  LVOT VTI:     0.194 m  LVOT/AV VTI ratio: 0.20  AI PHT:      284 msec    AORTA  Ao Root diam: 2.60 cm  Ao Asc diam: 3.60 cm   MR Peak grad:  164.4 mmHg TRICUSPID VALVE  MR Mean grad:  112.0 mmHg TR Peak grad:  52.1 mmHg  MR Vmax:     641.00 cm/s TR Vmax:    361.00 cm/s  MR Vmean:    508.0 cm/s  MR PISA:     5.09 cm  SHUNTS  MR PISA Eff ROA: 64 mm   Systemic VTI: 0.19 m  MR PISA Radius: 0.90 cm   Systemic Diam: 1.90 cm   Shirlee More MD  Electronically signed by Shirlee More MD  Signature Date/Time: 10/19/2019/1:10:27 PM      RIGHT HEART CATH AND CORONARY/GRAFT ANGIOGRAPHY  Conclusion  1.  Severe multivessel coronary artery disease with occlusion of the left coronary artery and severe calcific stenosis of the right coronary artery 2.  Status post aortocoronary bypass surgery with continued patency of the LIMA to LAD graft and saphenous vein graft OM, atretic RIMA to RCA 3.  Known severe aortic stenosis by noninvasive assessment  Recommendations: Continue evaluation for treatment of severe aortic stenosis, likely TAVR.  Will review with multidisciplinary heart team and likely proceed with atherectomy and stenting of the RCA via radial access followed by TAVR once his imaging studies are completed.  Indications  Severe aortic stenosis [I35.0 (ICD-10-CM)]  Procedural Details  Technical  Details INDICATION: Severe symptomatic aortic stenosis.  This 79 year old gentleman underwent multivessel CABG in 1997 at [facility].  He was reportedly treated with a RIMA to RCA, LIMA to LAD, and saphenous vein graft OM.  He has developed progressive and now severe symptomatic aortic stenosis and is referred for preoperative right and left heart catheterization.  He has New York Heart Association functional class III symptoms of dyspnea with exertion.  PROCEDURAL DETAILS: The right groin is prepped, draped, and anesthetized with 1% lidocaine. Using direct ultrasound guidance a 5 French sheath is placed in the right femoral artery and a 7 French sheath is placed in the right femoral vein. [place] images are captured and stored in the patient's chart. A Swan-Ganz catheter is used for the right heart catheterization. Standard protocol is followed for recording of right heart pressures and sampling of oxygen saturations. Fick cardiac output is calculated. Standard Judkins catheters are used for selective coronary angiography and bypass graft angiography.  There is some difficulty passing wires and catheters through the aortoiliac system because of heavy calcification.  It is also difficult to torque the catheters.  The 5 French 11 cm sheath is changed out for a 6 French 25 cm sheath over a 0.035 inch wire.  5 [place] guide catheter is used to try to selectively cannulate the left coronary artery.  The vessel is imaged nonselectively and appears to be occluded.  There are no immediate procedural complications. The patient is transferred to the post catheterization recovery area for further monitoring.    Estimated blood loss <50 mL.   During this procedure medications were administered to achieve and maintain moderate conscious sedation while the patient's heart rate, blood pressure, and oxygen saturation were continuously monitored and I was present face-to-face 100% of this time.  Medications (Filter:  Administrations occurring from 11/26/19 1156 to 11/26/19 1334) (important)  Continuous medications are totaled by the amount administered until 11/26/19 1334.  Heparin (Porcine) in NaCl 1000-0.9 UT/500ML-% SOLN (mL) Total volume:  1,000 mL Date/Time  Rate/Dose/Volume Action  11/26/19 1214  500 mL Given  1214  500 mL Given    fentaNYL (SUBLIMAZE) injection (mcg) Total dose:  25 mcg Date/Time  Rate/Dose/Volume Action  11/26/19 1226  25 mcg Given    midazolam (VERSED) injection (mg) Total dose:  1 mg Date/Time  Rate/Dose/Volume Action  11/26/19 1226  1 mg Given    lidocaine (PF) (XYLOCAINE) 1 % injection (mL) Total volume:  16 mL Date/Time  Rate/Dose/Volume Action  11/26/19 1234  16 mL Given    iohexol (OMNIPAQUE) 350 MG/ML injection (mL) Total volume:  115 mL Date/Time  Rate/Dose/Volume Action  11/26/19 1324  115 mL Given    Sedation Time  Sedation Time Physician-1: 52 minutes 32 seconds  Contrast  Medication Name Total Dose  iohexol (OMNIPAQUE) 350 MG/ML injection 115 mL    Radiation/Fluoro  Fluoro time: 17.4 (min) DAP: 22.2 (Gycm2) Cumulative Air Kerma: 337.5 (mGy)  Coronary Findings  Diagnostic Dominance: Right Left Main  Ost LM to Dist LM lesion 100% stenosed  Ost LM to Dist LM lesion is 100% stenosed. The lesion is severely calcified.  Right Coronary Artery  Prox RCA lesion 40% stenosed  Prox RCA lesion is 40% stenosed. The lesion is calcified.  Mid RCA lesion 95% stenosed  Mid RCA lesion is 95% stenosed. The lesion is eccentric. The lesion is severely calcified.  LIMA LIMA Graft To Mid LAD  LIMA and is normal in caliber. The graft exhibits no disease.  Saphenous Graft To 1st Mrg  SVG and is normal in caliber. The graft exhibits no disease. Widely patent SVG-OM, fills the entire LCx distribution  RIMA RIMA Graft To Dist RCA  RIMA. The graft is atretic. Atretic RIMA graft  Intervention  No interventions have been documented. Coronary  Diagrams  Diagnostic Dominance: Right  Intervention  Implants   Vascular Products  Closure Mynx Control [id number] -- [id number] Implanted  Inventory item: CLOSURE [facility] CONTROL 22F/63F Model/Cat number: [id number]  Manufacturer: [place] Lot number: [id number]  Device identifier: [id number] Device identifier type: GS1  GUDID Information  Request status Successful    Brand name: [facility] CONTROL Version/Model: [id number]  Company name: [place]. MRI safety info as of 11/26/19: MR Safe  Contains dry or latex rubber: No    GMDN P.T. name: Wound hydrogel dressing, non-antimicrobial    As of 11/26/2019  Status: Implanted      Syngo Images  Show images for CARDIAC CATHETERIZATION  Images on Long Term Storage  Show images for Mounir, Skipper to Procedure Log  Procedure Log    Hemo Data   Most Recent Value  Fick Cardiac Output 5.09 L/min  Fick Cardiac Output Index 3.02 (L/min)/BSA  RA A Wave -99 mmHg  RA V Wave 11 mmHg  RA Mean 6 mmHg  RV Systolic Pressure 56 mmHg  RV Diastolic Pressure -1 mmHg  RV EDP 4 mmHg  PA Systolic Pressure 59 mmHg  PA Diastolic Pressure 21 mmHg  PA Mean 37 mmHg  PW A Wave 19 mmHg  PW V Wave 24 mmHg  PW Mean 17 mmHg  AO Systolic Pressure 465 mmHg  AO Diastolic Pressure 52 mmHg  AO Mean 74 mmHg  QP/QS 1  TPVR Index 12.26 HRUI  TSVR Index 24.52 HRUI  PVR SVR Ratio 0.29  TPVR/TSVR Ratio 0.5        CORONARY ATHERECTOMY         Conclusion      Successful orbital atherectomy and stenting of the RCA with 3 overlapping DES, complicated by small coronary perforation requiring covered stent placement (4.0x20 mm Papyrus)         Recommendations   Antiplatelet/Anticoag Recommend dual antiplatelet therapy with Aspirin 63m daily and Clopidogrel 732mdaily long-term (beyond 12 months) because of covered stent placement.          Indications   Coronary artery disease involving native coronary artery of  native heart with angina pectoris ([facility][I25.119 (ICD-10-CM)]        Procedural Details   Technical Details INDICATION: Coronary artery disease with angina.  [age]ear old gentleman with previous CABG, undergoing TAVR evaluation for severe symptomatic aortic stenosis.  Preprocedure cardiac catheterization demonstrated critical calcific stenosis of the mid RCA and the patient presents today for atherectomy and stenting.  The RCA is a diffusely calcified and moderately diseased vessel.  PROCEDURAL DETAILS: The right wrist is prepped, draped, and anesthetized with 1% lidocaine. Using the modified Seldinger technique, a 5/6 French Slender sheath is introduced into the right radial artery. 3 mg of verapamil is administered through the sheath, weight-based unfractionated heparin was administered intravenously.  Initially, a 6 FrQataruide is used.  Heparin is administered for anticoagulation and a therapeutic ACT is achieved.  A Viper flex wire is advanced across the lesion into the distal RCA once a therapeutic ACT is achieved.  A CSI orbital atherectomy catheter is prepped using normal technique and a 1.25 mm crown is advanced into the mid vessel.  Multiple runs were made at low and high speed in order to adequately treat the calcified lesion in the mid vessel.  The device is removed without complication.  A 3.0 mm balloon is then advanced with a moderate amount of difficulty into the lesion site and it is dilated to 8 atm.  I tried to advance a 3.5 x 18 mm resolute Onyx DES but it  would not cross the proximal vessel due to calcification and moderate stenosis.  After further angiographic evaluation, I elected to treat the proximal vessel as I felt there was hemodynamically significant disease and also did not feel the mid lesion could be stented without treating the proximal vessel.  Guide catheter exchanged out for a 6 [place] AL-1 guide.  The Viper flex wire was again advanced into the distal vessel.   Atherectomy is again performed using a CSI 1.25 mm crown at low and high speed.  A 3.0 balloon is again advanced and dilated over the proximal segment.  I decided to treat back to the ostium with a 3.5 x 38 mm resolute Onyx DES deployed at 16 atm.  There was a short gap between the 2 stents and this is covered with a 3.5 x 12 mm resolute Onyx DES.  After stenting the vessel was postdilated with a 3.75 mm noncompliant balloon.  Further angiography demonstrates a small amount of dye extravasation through the proximal stented segment consistent with a perforation.  The patient remained hemodynamically stable but began to have more chest pain.  I elected to treat the segment with a 4.0 x 20 mm Papyrus covered stent.  The stent is deployed at burst pressure of 14 atm.  There continued to be a small amount of extravasation with modest improvement after the covered stent.  Therefore, the stent is postdilated with a 4.5 mm noncompliant balloon in order to obtain a better seal.  After noncompliant balloon postdilatation there is minimal dye seen at the perforation site.  The patient remained hemodynamically stable throughout.  A stat echocardiogram was performed in the cardiac catheterization lab and there is no pericardial effusion present.  The guide catheter and wire were removed and a TR band is used for radial hemostasis at the completion of the procedure.  The patient was transferred to the post catheterization recovery area for further monitoring.    Estimated blood loss <50 mL.   During this procedure medications were administered to achieve and maintain moderate conscious sedation while the patient's heart rate, blood pressure, and oxygen saturation were continuously monitored and I was present face-to-face 100% of this time.        Medications   (Filter: Administrations occurring from 12/24/19 1049 to 12/24/19 1337)      Continuous medications are totaled by the amount administered until  12/24/19 1337.      Heparin (Porcine) in NaCl 1000-0.9 UT/500ML-% SOLN (mL) Total volume:  1,000 mL      Date/Time     Rate/Dose/Volume  Action  12/24/19 1120  500 mL Given  1120  500 mL Given        midazolam (VERSED) injection (mg) Total dose:  4 mg      Date/Time     Rate/Dose/Volume  Action  12/24/19 1121  1 mg Canceled Entry  1121  1 mg Canceled Entry  1122  1 mg Given  1203  1 mg Given  1254  1 mg Given  1319  1 mg Given  1334  1 mg Canceled Entry        fentaNYL (SUBLIMAZE) injection (mcg) Total dose:  100 mcg      Date/Time     Rate/Dose/Volume  Action  12/24/19 1122  25 mcg Given  1203  25 mcg Given  1254  25 mcg Given  1312  25 mcg Given        lidocaine (PF) (XYLOCAINE) 1 % injection (mL) Total volume:  2 mL      Date/Time     Rate/Dose/Volume  Action  12/24/19 1128  2 mL Given        Radial Cocktail/Verapamil only (mL) Total volume:  10 mL      Date/Time     Rate/Dose/Volume  Action  12/24/19 1129  10 mL Given        heparin sodium (porcine) injection (Units) Total dose:  12,000 Units      Date/Time     Rate/Dose/Volume  Action  12/24/19 1130  6,000 Units Given  1145  3,000 Units Given  1232  3,000 Units Given        nitroGLYCERIN 1 mg/10 mL (100 mcg/mL) - IR/CATH LAB (mcg) Total dose:  500 mcg      Date/Time     Rate/Dose/Volume  Action  12/24/19 1134  200 mcg Given  1149  150 mcg Given  1235  150 mcg Given        iohexol (OMNIPAQUE) 350 MG/ML injection (mL) Total volume:  150 mL      Date/Time     Rate/Dose/Volume  Action  12/24/19 1315  150 mL Given        Heparin (Porcine) in NaCl 1000-0.9 UT/500ML-% SOLN (mL) Total volume:  500 mL      Date/Time     Rate/Dose/Volume  Action  12/24/19 1120  500 mL Given        sodium chloride 0.9 % 1,000 mL with ViperSlide Lubricant 1 each Total dose:  Cannot be calculated*     *Administration dose not documented    Date/Time     Rate/Dose/Volume  Action  12/24/19 1330   Given        sodium chloride 0.9 % 50 mL with aminophylline 325 mg infusion (mL/hr) Total dose:  Cannot be calculated* Dosing weight:  64.4    *Continuous medication not stopped within the calculation time range.    Date/Time     Rate/Dose/Volume  Action  12/24/19 1102  300 mL/hr New Bag/Given          Sedation Time      Sedation Time Physician-1: 1 hour 50 minutes 57 seconds           Contrast       Medication Name   Total Dose    iohexol (OMNIPAQUE) 350 MG/ML injection   150 mL           Radiation/Fluoro      Fluoro time: 27 (min)  DAP: 34.8 (Gycm2)  Cumulative Air Kerma: 749.9 (mGy)           Coronary Findings     Diagnostic Dominance: Right   Left Main  Ost LM to Dist LM lesion 100% stenosed  Ost LM to Dist LM lesion is 100% stenosed. The lesion is severely calcified.   Right Coronary Artery  Prox RCA lesion 60% stenosed  Prox RCA lesion is 60% stenosed. The lesion is calcified.  Prox RCA to Mid RCA lesion 50% stenosed  Prox RCA to Mid RCA lesion is 50% stenosed. The lesion is calcified.  Mid RCA lesion 95% stenosed  Mid RCA lesion is 95% stenosed. The lesion is eccentric. The lesion is severely calcified.   LIMA LIMA Graft To Mid LAD  LIMA and is normal in caliber. The graft exhibits no disease.   Saphenous Graft To 1st Mrg  SVG and is normal in caliber. The graft exhibits no disease. Widely patent SVG-OM, fills the entire LCx distribution  RIMA RIMA Graft To Dist RCA  RIMA. The graft is atretic. Atretic RIMA graft     Intervention   Prox RCA lesion  Stent  Pre-stent angioplasty was performed using a BALLOON SAPPHIRE 3.0X12. A drug-eluting stent was successfully placed using a STENT RESOLUTE ONYX 3.5X38.  Post-Intervention Lesion Assessment  The intervention was successful.  Pre-interventional TIMI flow is 3. Post-intervention TIMI flow is 3. At this lesion, a perforation of the vessel occurred. Coronary Perforation treated with a Papyrus covered stent  There is a 0% residual stenosis post intervention.   Prox RCA to Mid RCA lesion  Stent  A drug-eluting stent was successfully placed using a STENT RESOLUTE ONYX 3.5X12.  Post-Intervention Lesion Assessment  The intervention was successful. Pre-interventional TIMI flow is 3. Post-intervention TIMI flow is 3. No complications occurred at this lesion.  There is a 0% residual stenosis post intervention.   Mid RCA lesion  Stent  A drug-eluting stent was successfully placed using a STENT RESOLUTE ONYX 3.5X18.  Post-Intervention Lesion Assessment  The intervention was successful. Pre-interventional TIMI flow is 3. Post-intervention TIMI flow is 3. No complications occurred at this lesion.  There is a 0% residual stenosis post intervention.                        Coronary Diagrams     Diagnostic Dominance: Right   Diagnostic Image    Intervention   Intervention Image          Implants      Permanent Stent      Stent Resolute Onyx 3.5x18 - [id number] - Implanted    Inventory item: Loma Sousa 3.5X18 Model/Cat number: [id number]  Manufacturer: [place] Lot number: [id number]  Device identifier: [id number] Device identifier type: GS1  Area Of Implantation: Mid RCA       GUDID Information   Request status Successful     Brand name: Resolute OnyxT Version/Model: [place] name: [employer] MRI safety info as of 12/24/19: MR Conditional  Contains dry or latex rubber: No    GMDN P.T. name: Drug-eluting coronary artery stent, non-bioabsorbable-polymer-coated       As of 12/24/2019   Status: Implanted          Stent Resolute Onyx 3.5x38 - [id number] - Implanted    Inventory item: Loma Sousa 7.5O83  Model/Cat number: [id number]  Manufacturer: [place] Lot number: [id number]  Device identifier: [id number] Device identifier type: GS1  Area Of Implantation: Prox RCA       GUDID Information   Request status Successful     Brand name: Resolute OnyxT Version/Model: [id number]  Company name: [employer] MRI safety info as of 12/24/19: MR Conditional  Contains dry or latex rubber: No    GMDN P.T. name: Drug-eluting coronary artery stent, non-bioabsorbable-polymer-coated       As of 12/24/2019   Status: Implanted          Stent Resolute Onyx 3.5x12 - [id number] - Implanted    Inventory item: Loma Sousa 3.3X83 Model/Cat number: [id number]  Manufacturer: [place] Lot number: [id number]  Device identifier: [id number] Device identifier type: GS1  Area Of Implantation: Mid RCA       GUDID Information   Request status Successful     Brand name: Resolute OnyxT Version/Model: [id number]  Company name: [employer] MRI safety info as of 12/24/19: MR Conditional  Contains dry or latex rubber: No    GMDN P.T. name:  Drug-eluting coronary artery stent, non-bioabsorbable-polymer-coated       As of 12/24/2019   Status: Implanted          Stent Pk Papyrus 4.0x20 - [id number] - Implanted    Inventory item: STENT PK PAPYRUS 4.0X20 Model/Cat number: [id number]  Manufacturer: [employer] Lot number: [id number]  Device identifier: [id number] Device identifier type: GS1  Area Of Implantation: Prox RCA       GUDID Information   Request status Successful     Brand name: PK Papyrus Version/Model: 4.0/20/140  Company name: [employer] MRI safety info as of 12/24/19: MR Conditional  Contains dry or latex rubber: No    GMDN P.T. name: Bare-metal coronary artery stent       As of 12/24/2019   Status: Implanted       Cardiac TAVR CT  TECHNIQUE: The patient was scanned on a Siemens Force 779 slice  scanner. A 120 kV retrospective scan was triggered in the descending thoracic aorta at 111 HU's. Gantry rotation speed was 270 msecs and collimation was .9 mm. No beta blockade or nitro were given. The 3D data set was reconstructed in 5% intervals of the R-R cycle. Systolic and diastolic phases were analyzed on a dedicated work station using MPR, MIP and VRT modes. The patient received 163m OMNIPAQUE IOHEXOL 350 MG/ML SOLN of contrast.  FINDINGS: Motion artifact reduces the diagnostic quality of this exam.  Aortic Valve: Tricuspid aortic valve. Severely reduced cusp separation. Severely thickened, severely calcified aortic valve cusps.  AV calcium score: 1593  Annulus measurements made at 5% of R-R interval to reduce motion artifact.  Virtual Basal Annulus Measurements:  Maximum/Minimum Diameter: 27.8 x 21.2 mm  Perimeter: 78.3 mm  Area: 466 mm2  LVOT calcification present.  Based on these measurements, the annulus would be suitable for a 26 mm Sapien 3 valve.  Sinus of Valsalva Measurements:  Non-coronary:  33 mm  Right - coronary:  31 mm  Left - coronary:  32 mm  Sinus of Valsalva Height:  Left: 20 mm  Right: 20.5 mm  Aorta: Severe calcifications.  Sinotubular Junction:  28 mm  Ascending Thoracic Aorta:  38 mm  Aortic Arch:  28 mm  Descending Thoracic Aorta:  29 mm  Coronary Artery Height above Annulus:  Left Main: 16.5 mm. Left main is severely calcified and is known to be occluded.  Right Coronary: 15.3 mm  Coronary Arteries: Severe 3 vessel coronary artery calcifications. S/p CABG. LIMA graft to LAD and SVG to L circumflex system appear patent.  Optimum Fluoroscopic Angle for Delivery: RAO 0, CRA 1  No left atrial appendage thrombus.  IMPRESSION: 1. Tricuspid aortic valve. Severely reduced cusp separation. Severely thickened, severely calcified aortic valve cusps.  2.  AV calcium score 1593  3. Annulus  area: 466 mm2. The annulus would be suitable for a 26 mm Sapien 3 valve. LVOT calcifications.  4. Adequate right coronary height from the annulus. Left coronary artery height is adequate but is known to be occluded.  5.  Borderline mid ascending aorta dilation, 38 mm.  6. Optimum Fluoroscopic Angle for Delivery: RAO 0, CRA 1   Electronically Signed   By: GCherlynn Kaiser  On: 12/12/2019 18:39    CT ANGIOGRAPHY CHEST, ABDOMEN AND PELVIS  TECHNIQUE: Multidetector CT imaging through the chest, abdomen and pelvis was performed using the standard protocol during bolus administration of intravenous contrast. Multiplanar reconstructed images and MIPs were obtained and reviewed to evaluate the vascular anatomy.  CONTRAST:  161m OMNIPAQUE IOHEXOL 350 MG/ML SOLN  COMPARISON:  CT the abdomen and pelvis 02/18/2012.  FINDINGS: CTA CHEST FINDINGS  Cardiovascular: Heart size is mildly enlarged. There is no significant pericardial fluid, thickening or pericardial calcification. There is aortic atherosclerosis, as well as atherosclerosis of the great vessels of the mediastinum and the coronary arteries, including calcified atherosclerotic plaque in the left main, left anterior descending, left circumflex and right coronary arteries. Status post median sternotomy for CABG including LIMA to the LAD. Severe thickening and calcification of the aortic valve. Calcifications of the mitral annulus and mitral valve.  Mediastinum/Lymph Nodes: No pathologically enlarged mediastinal or hilar lymph nodes. Moderate hiatal hernia. No axillary lymphadenopathy.  Lungs/Pleura: Widespread areas of mild ground-glass attenuation and interlobular septal thickening noted throughout the lungs bilaterally, favored to reflect a background of mild pulmonary edema. Trace bilateral pleural effusions lying dependently (right greater than left). No confluent consolidative airspace disease.  No suspicious appearing pulmonary nodules or masses are noted.  Musculoskeletal/Soft Tissues: Median sternotomy wires. There are no aggressive appearing lytic or blastic lesions noted in the visualized portions of the skeleton. Multiple vertebral body compression fractures at the T3, T7 and T8, most severe at T3 where there is 30% loss of anterior vertebral body height.  CTA ABDOMEN AND PELVIS FINDINGS  Hepatobiliary: Liver has a nodular contour, which may suggest early changes of cirrhosis. No suspicious cystic or solid hepatic lesions. No intra or extrahepatic biliary ductal dilatation. Gallbladder is normal in appearance.  Pancreas: Diffuse pancreatic atrophy. No pancreatic mass. No pancreatic ductal dilatation. No pancreatic or peripancreatic fluid collections or inflammatory changes.  Spleen: Unremarkable.  Adrenals/Urinary Tract: 1.7 cm low-attenuation lesion in the lower pole the right kidney, compatible with a simple cyst. Left kidney and bilateral adrenal glands are normal in appearance. No hydroureteronephrosis. Urinary bladder is normal in appearance.  Stomach/Bowel: Normal appearance of the intra-abdominal portion of the stomach. No pathologic dilatation of small bowel or colon. Numerous colonic diverticulae are noted, without definite surrounding inflammatory changes to suggest an acute diverticulitis at this time. Normal appendix.  Vascular/Lymphatic: Aortic atherosclerosis, with vascular findings and measurements pertinent to potential TAVR procedure, as detailed below. No aneurysm or dissection noted in the abdominal or pelvic vasculature. No lymphadenopathy noted in the abdomen or pelvis.  Reproductive: Prostate gland and seminal vesicles are unremarkable in appearance.  Other: Trace volume of ascites noted in the visualized portions of the peritoneal cavity. No pneumoperitoneum.  Musculoskeletal: Compression fractures are noted at L1 and L4,  with post vertebroplasty changes at L4. There are no aggressive appearing lytic or blastic lesions noted in the visualized portions of the skeleton.  VASCULAR MEASUREMENTS PERTINENT TO TAVR:  AORTA:  Minimal Aortic Diameter-14 x 11 mm  Severity of Aortic Calcification-severe  RIGHT PELVIS:  Right Common Iliac Artery -  Minimal Diameter-6.1 x 4.2 mm  Tortuosity-mild-to-moderate  Calcification-severe  Right External Iliac Artery -  Minimal Diameter-7.7 x 2.9 mm  Tortuosity-moderate  Calcification-moderate  Right Common Femoral Artery -  Minimal Diameter-6.7 x 1.7 mm  Tortuosity-mild  Calcification-moderate  LEFT PELVIS:  Left Common Iliac Artery -  Minimal Diameter-3.7 x 2.1 mm  Tortuosity-mild-to-moderate  Calcification-severe  Left External Iliac Artery -  Minimal Diameter-5.7 x 7.2 mm  Tortuosity-moderate  Calcification-mild-to-moderate  Left Common Femoral Artery -  Minimal Diameter-7.7 x 3.2 mm  Tortuosity-mild  Calcification-moderate  Review of the MIP images confirms the above findings.  IMPRESSION: 1. Vascular findings and measurements pertinent to potential TAVR procedure, as  detailed above. 2. Severe thickening calcification of the aortic valve, compatible with reported clinical history of severe aortic stenosis. 3. Aortic atherosclerosis, in addition to left main and 3 vessel coronary artery disease. Status post median sternotomy for CABG including LIMA to the LAD. 4. Mild cardiomegaly with findings suggestive of mild congestive heart failure, as above. 5. The appearance of the liver suggests underlying cirrhosis. 6. Diffuse pancreatic atrophy. 7. Severe colonic diverticulosis without evidence of acute diverticulitis at this time. 8. Additional incidental findings, as above.   Electronically Signed   By: Vinnie Langton M.D.   On: 12/08/2019 14:09   EKG:     Atrial fibrillation w/ RBBB  and LAFB    STS Risk Calculator  AVR + CAB   Risk of Mortality:20.050%  Renal Failure: 8.881%  Permanent Stroke:4.398%  Prolonged Ventilation: 36.389%  DSW Infection:0.227%  Reoperation:12.241%  Morbidity or Mortality: 46.431%  Short Length of Stay: 8.609%  Long Length of Stay: 26.250%     Impression:  Patient has stage D severe symptomatic aortic stenosis and multivessel coronary artery disease.  He describes a slow gradual progression of symptoms of exertional shortness of breath and fatigue which have gotten considerably worse over the last few months and now occasionally occur at rest, consistent with an acute exacerbation of chronic diastolic congestive heart failure, New [place] Heart Association functional class IIIb.  He is not currently having symptoms of chest pain or chest tightness suspicious for angina pectoris and he otherwise currently appears to be doing well since his recent PCI and stenting of the right coronary artery.  I have personally reviewed the patient's recent transthoracic echocardiogram, diagnostic cardiac catheterization, and CT angiograms.  Echocardiograms reveal severe aortic stenosis with preserved left ventricular systolic function.  The aortic valve is trileaflet with severe thickening, calcification, and restricted leaflet mobility involving all 3 leaflets.  Peak velocity across the aortic valve measured close to 4.0 m/s corresponding to mean transvalvular gradient estimated 39 mmHg and aortic valve area calculated 0.57 cm.  DVI was notably quite low at 0.20.  The patient also has mild to moderate aortic insufficiency and mild to moderate mitral regurgitation.  Diagnostic cardiac catheterization revealed severe native coronary artery disease with chronic occlusion of the left coronary system but continued patency of the left internal mammary artery graft to the left anterior descending coronary artery and saphenous vein graft to the obtuse marginal branch.   The patient had isolated high-grade stenosis of the mid right coronary artery which has been successfully treated with PCI and stenting.  I agree the patient would benefit from aortic valve replacement.  I would be reluctant to consider this elderly gentleman a candidate for aortic valve replacement via conventional surgical redo sternotomy with or without concomitant redo coronary artery bypass grafting.  Cardiac-gated CTA of the heart reveals anatomical characteristics consistent with aortic stenosis suitable for treatment by transcatheter aortic valve replacement without any significant complicating features.  CTA of the aorta and iliac vessels demonstrates significant atherosclerotic disease in the pelvic vasculature but what may be adequate pelvic vascular access to facilitate a transfemoral approach.  Left subclavian approach could be utilized if transfemoral approach is not adequate, although this would require passing the cannula across the origin of the left internal mammary artery graft.  I would be reluctant to consider transcarotid approach given the patient's cerebrovascular disease.  Transapical approach can be considered if transfemoral or left subclavian approach is or not feasible.  Patient will be at relatively high risk  for need for permanent pacemaker placement due to the presence of baseline right bundle branch block and left anterior fascicular block.    Plan:  The patient and his wife were counseled at length regarding treatment alternatives for management of severe symptomatic aortic stenosis. Alternative approaches such as conventional aortic valve replacement, transcatheter aortic valve replacement, and continued medical therapy without intervention were compared and contrasted at length.  The risks associated with conventional surgical aortic valve replacement were discussed in detail, as were expectations for post-operative convalescence, and why I would be reluctant to consider  this patient a candidate for conventional surgery.  Issues specific to transcatheter aortic valve replacement were discussed including questions about long term valve durability, the potential for paravalvular leak, possible increased risk of need for permanent pacemaker placement, and other technical complications related to the procedure itself.  Long-term prognosis with medical therapy was discussed. This discussion was placed in the context of the patient's own specific clinical presentation and past medical history.  All of their questions have been addressed.  The patient hopes to proceed with transcatheter aortic valve replacement as soon as possible.  We tentatively plan for surgery on Jan 18, 2020.  Following the decision to proceed with transcatheter aortic valve replacement, a discussion has been held regarding what types of management strategies would be attempted intraoperatively in the event of life-threatening complications, including whether or not the patient would be considered a candidate for the use of cardiopulmonary bypass and/or conversion to open sternotomy for attempted surgical intervention.  The patient specifically requests that should a potentially life-threatening complication develop we would not attempt emergency median sternotomy and/or other aggressive surgical procedures.  The patient has been advised of a variety of complications that might develop including but not limited to risks of death, stroke, paravalvular leak, aortic dissection or other major vascular complications, aortic annulus rupture, device embolization, cardiac rupture or perforation, mitral regurgitation, acute myocardial infarction, arrhythmia, heart block or bradycardia requiring permanent pacemaker placement, congestive heart failure, respiratory failure, renal failure, pneumonia, infection, other late complications related to structural valve deterioration or migration, or other complications that might  ultimately cause a temporary or permanent loss of functional independence or other long term morbidity.  The patient provides full informed consent for the procedure as described and all questions were answered.     I spent in excess of 90 minutes during the conduct of this office consultation and >50% of this time involved direct face-to-face encounter with the patient for counseling and/or coordination of their care.   Valentina Gu. Roxy Manns, MD 01/10/2020 9:50 AM

## 2020-01-10 NOTE — Progress Notes (Addendum)
[facility]  CARDIOTHORACIC SURGERY CONSULTATION REPORT  Referring Provider is Serafina Mitchell, MD Primary Cardiologist is Kirk Ruths, MD PCP is Emeterio Reeve, DO  Chief Complaint  Patient presents with  . Aortic Stenosis    Surgical eval for TAVR, review all studies and testing    HPI:  Patient is a 79 year old male with history of aortic stenosis, coronary artery disease status post coronary artery bypass grafting in 1997 and recently status post PCI and stenting of the right coronary artery, asymptomatic bilateral carotid artery stenosis with high-grade right internal carotid artery stenosis, longstanding persistent atrial fibrillation not currently on anticoagulation, hypertension, type 2 diabetes mellitus, Barrett's esophagus, GE reflux disease, thrombocytopenia, and hyperlipidemia who has been referred for surgical consultation to discuss treatment options for management of severe symptomatic aortic stenosis and multivessel coronary artery disease.  Patient's cardiac history dates back to 1997 when he first presented with coronary artery disease.  He underwent coronary artery bypass grafting x3 at [facility] in [place].  He has been followed for the last several years by Dr. Stanford Breed with history of aortic stenosis, coronary artery disease, and persistent atrial fibrillation.  He was anticoagulated using warfarin for a period of time but this was stopped because of problems with GI bleeding and hematuria and difficulty maintaining therapeutic levels.  Patient has known history of aortic stenosis that has progressed on serial follow-up echocardiograms.  He also has a long history of exertional shortness of breath that has slowly progressed and become quite problematic over the last few months.  Recent echocardiogram performed October 19, 2019 revealed normal left ventricular systolic function with  severe aortic stenosis, moderate aortic insufficiency and moderate mitral regurgitation.  He was seen in follow-up by Dr. Stanford Breed and referred to the multidisciplinary heart valve clinic.  Diagnostic cardiac catheterization performed by Dr. Burt Knack on November 26, 2019 revealed severe multivessel coronary artery disease with chronic occlusion of the left coronary artery and severe high-grade stenosis of the right coronary artery.  There was continued patency of left internal mammary artery to the left anterior descending coronary artery and greater saphenous vein graft to the obtuse marginal branch.  Right internal mammary artery graft previously placed to the right coronary artery was atretic.  The patient's case was reviewed by multidisciplinary team of specialists and the patient subsequently was brought back to the Cath Lab on December 24, 2019 for PCI and stenting of the right coronary artery.  This procedure was complicated by coronary perforation requiring placement of a covered stent.  Following the procedure the patient had 2 days of chest pain but serial echocardiograms revealed no significant pericardial effusion.  The patient was discharged from the hospital but readmitted December 28, 2019 following a syncopal episode that was felt likely to be related to his aortic stenosis, atrial fibrillation, and dehydration.  Medications were adjusted and a ZIO patch monitor placed.  Since hospital discharge the patient has been seen in follow-up on 1 occasion at [facility].  He was subsequently referred for surgical consultation.  Patient is married and lives locally in [place] with his wife and sister.  He has 2 adult children and 3 grandchildren.  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  He remains functionally independent but he complains that he has really been limited over the last few months due to worsening symptoms of exertional shortness of breath and fatigue.  He now  gets short of breath with low-level activity and occasionally at rest.  He has not had any chest pain or chest tightness since his most recent hospital discharge.  He has had some lower extremity edema.  Since hospital discharge weight has stabilized on current dosages of Lasix but he still gets short of breath quite easily.  He has not had any dizzy spells or repeat syncopal events since the event which prompted hospital admission on December 28, 2019.  Past Medical History:  Diagnosis Date  . Anemia   . Atherosclerosis of native coronary artery of native heart without angina pectoris 01/05/2016  . Benign essential hypertension 01/05/2016  . Carotid artery disease ([place])   . Cataract, nuclear, right 05/06/2014  . Chronic atrial fibrillation ([place]) 12/20/2015  . Current use of long term anticoagulation 12/20/2015  . Diabetic kidney disease ([place]) 01/05/2016  . GERD (gastroesophageal reflux disease)   . GI bleed   . History of Barrett's esophagus 12/20/2015  . History of blood in urine   . 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 Hyperlipemia 10/26/2015  . Low back pain 05/02/2016  . Severe aortic stenosis   . Thrombocytopenia ([place])   . Type 2 diabetes mellitus with kidney complication, without long-term current use of insulin ([place]) 07/03/2016    Past Surgical History:  Procedure Laterality Date  . BACK SURGERY  2004  . [place]  . CORONARY ATHERECTOMY N/A 12/24/2019   Procedure: CORONARY ATHERECTOMY;  Surgeon: Sherren Mocha, MD;  Location: [place] [facility];  Service: Cardiovascular;  Laterality: N/A;  . RIGHT HEART CATH AND CORONARY/GRAFT ANGIOGRAPHY N/A 11/26/2019   Procedure: RIGHT HEART CATH AND CORONARY/GRAFT ANGIOGRAPHY;  Surgeon: Sherren Mocha, MD;  Location: [place] [facility];  Service: Cardiovascular;  Laterality: N/A;  . SHOULDER SURGERY  2005    Family History  Problem Relation Age of Onset  . CAD Father   . Colon cancer Neg Hx    . Heart attack Neg Hx     Social History   Socioeconomic History  . Marital status: Married    Spouse name: Edd Fabian  . Number of children: 2  . Years of education: 5  . Highest education level: 11th grade  Occupational History  . Occupation: truck Geophysicist/field seismologist    Comment: retired  Tobacco Use  . Smoking status: Former Smoker    Quit date: 09/10/1987    Years since quitting: 32.3  . Smokeless tobacco: Never Used  Substance and Sexual Activity  . Alcohol use: No  . Drug use: No  . Sexual activity: Not Currently  Other Topics Concern  . 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 Keeps grandsons and granddaughter   Social Determinants of Health   Financial Resource Strain:   . Difficulty of Paying Living Expenses:   Food Insecurity: No Food Insecurity  . Worried About Charity fundraiser in the Last Year: Never true  . Ran Out of Food in the Last Year: Never true  Transportation Needs: No Transportation Needs  . Lack of Transportation (Medical): No  . Lack of Transportation (Non-Medical): No  Physical Activity:   . Days of Exercise per Week:   . Minutes of Exercise per Session:   Stress:   . Feeling of Stress :   Social Connections:   .  Frequency of Communication with Friends and Family:   . Frequency of Social Gatherings with Friends and Family:   . Attends Religious Services:   . Active Member of Clubs or Organizations:   . Attends Archivist Meetings:   Marland Kitchen Marital Status:   Intimate Partner Violence:   . Fear of Current or Ex-Partner:   . Emotionally Abused:   Marland Kitchen Physically Abused:   . Sexually Abused:     Current Outpatient Medications  Medication Sig Dispense Refill  . ACCU-CHEK SOFTCLIX LANCETS lancets Use as instructed to check blood sugar up to qid. Dx Code E11.22. 100 each 12  . Alcohol Swabs PADS Use as instructed to check blood sugar up to qid. Dx code E11.22  100 each 1  . AMBULATORY NON FORMULARY MEDICATION Medication Name: accu-chek aviva plus meter, accu-check aviva plus test strips & accu-chek softclix lancets to test twice a day. Dx: E11.29 1 vial PRN  . aspirin 81 MG chewable tablet Chew 81 mg by mouth daily.     . Blood Glucose Calibration (ACCU-CHEK AVIVA) SOLN Use as instructed per manufacturer directions. Dx code E11.22 1 each 0  . Blood Glucose Monitoring Suppl (ACCU-CHEK AVIVA PLUS) w/Device KIT Use as instructed to check blood sugar up to qid. Dx code E11.22 1 kit 0  . clopidogrel (PLAVIX) 75 MG tablet TAKE 1 TABLET BY MOUTH  DAILY (Patient taking differently: Take 75 mg by mouth daily. ) 90 tablet 3  . ferrous sulfate 325 (65 FE) MG EC tablet Take 1 tablet (325 mg total) by mouth daily with breakfast. 90 tablet 3  . furosemide (LASIX) 40 MG tablet Take 0.5 tablets (20 mg total) by mouth daily.    Marland Kitchen glipiZIDE (GLUCOTROL) 5 MG tablet TAKE 1 TABLET BY MOUTH  TWICE A DAY BEFORE MEALS (Patient taking differently: Take 5 mg by mouth 2 (two) times daily before a meal. ) 180 tablet 1  . glucose blood (ACCU-CHEK AVIVA PLUS) test strip Use as instructed to check blood sugar up to qid. Dx Code E11.22. Disp generic per pt preference / insurance coverage 100 each 99  . Lancets (ONETOUCH ULTRASOFT) lancets Use as instructed to check blood sugar up to qid. Dx: E11.22 100 each 99  . metFORMIN (GLUCOPHAGE) 1000 MG tablet TAKE 1 TABLET BY MOUTH  TWICE DAILY WITH A MEAL (Patient taking differently: Take 1,000 mg by mouth 2 (two) times daily with a meal. ) 180 tablet 1  . metoprolol succinate (TOPROL-XL) 25 MG 24 hr tablet TAKE 1 TABLET BY MOUTH  DAILY (Patient taking differently: Take 25 mg by mouth daily. ) 90 tablet 1  . Multiple Vitamin (MULTIVITAMIN WITH MINERALS) TABS tablet Take 1 tablet by mouth daily.    . nitroGLYCERIN (NITROSTAT) 0.4 MG SL tablet Place 1 tablet (0.4 mg total) under the tongue every 5 (five) minutes as needed for chest pain. 30 tablet 1   . pantoprazole (PROTONIX) 20 MG tablet TAKE 1 TABLET BY MOUTH  DAILY (Patient taking differently: Take 20 mg by mouth daily. ) 90 tablet 1  . pravastatin (PRAVACHOL) 40 MG tablet TAKE 1 TABLET BY MOUTH  DAILY (Patient taking differently: Take 40 mg by mouth daily. ) 90 tablet 1   No current facility-administered medications for this visit.    Allergies  Allergen Reactions  . Atorvastatin     Other reaction(s): Myalgias (intolerance)  . Cholestyramine Other (See Comments)    Constipation  . Pravastatin Other (See Comments)    Does not  tolerate high doses      Review of Systems:   General:  decreased appetite, decreased energy, no weight gain, no weight loss, no fever  Cardiac:  no chest pain with exertion, no chest pain at rest, +SOB with exertion, occasional resting SOB, no PND, + orthopnea, no palpitations, + arrhythmia, + atrial fibrillation, + LE edema, + dizzy spells, + syncope  Respiratory:  + shortness of breath, no home oxygen, no productive cough, no dry cough, no bronchitis, no wheezing, no hemoptysis, no asthma, no pain with inspiration or cough, no sleep apnea, no CPAP at night  GI:   no difficulty swallowing, + reflux, no frequent heartburn, no hiatal hernia, no abdominal pain, no constipation, + diarrhea, no hematochezia, no hematemesis, no melena  GU:   no dysuria,  no frequency, no urinary tract infection, no hematuria, no enlarged prostate, + kidney stones, no kidney disease  Vascular:  no pain suggestive of claudication, no pain in feet, no leg cramps, no varicose veins, no DVT, no non-healing foot ulcer  Neuro:   no stroke, no TIA's, no seizures, no headaches, no temporary blindness one eye,  no slurred speech, no peripheral neuropathy, no chronic pain, no instability of gait, no memory/cognitive dysfunction  Musculoskeletal: + arthritis, no joint swelling, no myalgias, no difficulty walking, NORMAL mobility   Skin:   no rash, no itching, no skin infections, no  pressure sores or ulcerations  Psych:   no anxiety, no depression, no nervousness, no unusual recent stress  Eyes:   no blurry vision, no floaters, no recent vision changes, + wears glasses or contacts  ENT:   no hearing loss, no loose or painful teeth, no dentures, last saw dentist 1 month ago  Hematologic:  no easy bruising, no abnormal bleeding, no clotting disorder, no frequent epistaxis  Endocrine:  + diabetes, does check CBG's at home           Physical Exam:   BP (!) 142/69 (BP Location: Right Arm, Patient Position: Sitting, Cuff Size: Normal)   Pulse 87   Temp 98.7 F (37.1 C) (Oral)   Resp 20   Ht _0  (1.626 m)   Wt 147 lb (66.7 kg)   SpO2 94% Comment: RA  BMI 25.23 kg/m   General:  Elderly,  well-appearing  HEENT:  Unremarkable   Neck:   no JVD, no bruits, no adenopathy   Chest:   Few bibasilar insp crackles c/w rales, symmetrical breath sounds, no wheezes, no rhonchi   CV:   Irregular rate and rhythm, grade IV/VI crescendo/decrescendo murmur heard best at RSB,  no diastolic murmur  Abdomen:  soft, non-tender, no masses   Extremities:  warm, well-perfused, pulses diminished but palpable, trace LE edema  Rectal/GU  Deferred  Neuro:   Grossly non-focal and symmetrical throughout  Skin:   Clean and dry, no rashes, no breakdown   Diagnostic Tests:   ECHOCARDIOGRAM REPORT       Patient Name:  ARVEL OQUINN Date of Exam: 10/19/2019  Medical Rec #: [id number]   Height:    64.0 in  Accession #:  [id number]  Weight:    144.0 lb  Date of Birth: [birth year]   BSA:     1.70 m  Patient Age:  15 years   BP:      111/60 mmHg  Patient Gender: M       HR:      77 bpm.  Exam Location: [place]   Procedure: 2D Echo,  Cardiac Doppler and Color Doppler   Indications:  Aortic Stenosis    History:    Patient has prior history of Echocardiogram examinations,  most         recent 10/13/2018.    Sonographer:   Cardell Peach RDCS (AE)  Referring Phys: [place]    1. Left ventricular ejection fraction, by estimation, is 55 to 60%. The  left ventricle has normal function. The left ventrical has no regional  wall motion abnormalities. There is mildly increased left ventricular  hypertrophy. Left ventricular diastolic  parameters are indeterminate.  2. Right ventricular systolic function is normal. The right ventricular  size is normal. There is moderately elevated pulmonary artery systolic  pressure.  3. Left atrial size was severely dilated.  4. The mitral valve is degenerative. Moderate mitral valve regurgitation.  5. Tricuspid valve regurgitation is moderate.  6. The aortic valve is bicuspid. Aortic valve regurgitation is mild to  moderate. Severe aortic valve stenosis.  7. The inferior vena cava is normal in size with <50% respiratory  variability, suggesting right atrial pressure of 8 mmHg.   FINDINGS  Left Ventricle: Left ventricular ejection fraction, by estimation, is 55  to 60%. The left ventricle has normal function. The left ventricle has no  regional wall motion abnormalities. The left ventricular internal cavity  size was normal in size. There is  mildly increased left ventricular hypertrophy. Concentric remodeling left  ventricular hypertrophy. Left ventricular diastolic parameters are  indeterminate.   Right Ventricle: The right ventricular size is normal. No increase in  right ventricular wall thickness. Right ventricular systolic function is  normal. There is moderately elevated pulmonary artery systolic pressure.  The tricuspid regurgitant velocity is  3.61 m/s, and with an assumed right atrial pressure of 8 mmHg, the  estimated right ventricular systolic pressure is 94.8 mmHg.   Left Atrium: Left atrial size was severely dilated.   Right Atrium: Right atrial size was normal in size.   Pericardium: There is no evidence of  pericardial effusion.   Mitral Valve: The mitral valve is degenerative in appearance. There is  moderate thickening of the anterior and posterior mitral valve leaflet(s).  There is mild calcification of the anterior mitral valve leaflet(s).  Normal mobility of the mitral valve  leaflets. Moderate mitral annular calcification. Moderate mitral valve  regurgitation. No evidence of mitral valve stenosis.   Tricuspid Valve: The tricuspid valve is normal in structure. Tricuspid  valve regurgitation is moderate . No evidence of tricuspid stenosis.   Aortic Valve: The aortic valve is bicuspid and severely retricted. Aortic  valve regurgitation is mild to moderate. Aortic regurgitation PHT measures  284 msec. Severe aortic stenosis is present. Mild aortic valve annular  calcification. There is moderate  thickening of the aortic valve. Aortic valve mean gradient measures 39.4  mmHg. Aortic valve peak gradient measures 59.4 mmHg. Aortic valve area, by  VTI measures 0.57 cm.   Pulmonic Valve: The pulmonic valve was normal in structure. Pulmonic valve  regurgitation is mild. No evidence of pulmonic stenosis.   Aorta: The aortic root is normal in size and structure.   Venous: The pulmonary veins were not well visualized. The inferior vena  cava is normal in size with less than 50% respiratory variability,  suggesting right atrial pressure of 8 mmHg. The inferior vena cava and the  hepatic vein show a normal flow pattern.   IAS/Shunts: No atrial level shunt detected by color flow Doppler.  LEFT VENTRICLE  PLAX 2D  LVIDd:     4.47 cm  LVIDs:     2.73 cm  LV PW:     1.23 cm  LV IVS:    1.38 cm  LVOT diam:   1.90 cm  LV SV:     55.00 ml  LV SV Index:  36.59  LVOT Area:   2.84 cm     RIGHT VENTRICLE      IVC  RV Basal diam: 3.35 cm  IVC diam: 1.70 cm  RV S prime:   9.68 cm/s  TAPSE (M-mode): 1.6 cm   LEFT ATRIUM       Index    RIGHT  ATRIUM      Index  LA diam:    4.70 cm 2.76 cm/m RA Area:   16.70 cm  LA Vol (A2C):  67.1 ml 39.44 ml/m RA Volume:  39.40 ml 23.16 ml/m  LA Vol (A4C):  86.4 ml 50.78 ml/m  LA Biplane Vol: 81.3 ml 47.78 ml/m  AORTIC VALVE  AV Area (Vmax):  0.61 cm  AV Area (Vmean):  0.56 cm  AV Area (VTI):   0.57 cm  AV Vmax:      385.40 cm/s  AV Vmean:     299.400 cm/s  AV VTI:      0.958 m  AV Peak Grad:   59.4 mmHg  AV Mean Grad:   39.4 mmHg  LVOT Vmax:     82.55 cm/s  LVOT Vmean:    59.000 cm/s  LVOT VTI:     0.194 m  LVOT/AV VTI ratio: 0.20  AI PHT:      284 msec    AORTA  Ao Root diam: 2.60 cm  Ao Asc diam: 3.60 cm   MR Peak grad:  164.4 mmHg TRICUSPID VALVE  MR Mean grad:  112.0 mmHg TR Peak grad:  52.1 mmHg  MR Vmax:     641.00 cm/s TR Vmax:    361.00 cm/s  MR Vmean:    508.0 cm/s  MR PISA:     5.09 cm  SHUNTS  MR PISA Eff ROA: 64 mm   Systemic VTI: 0.19 m  MR PISA Radius: 0.90 cm   Systemic Diam: 1.90 cm   Shirlee More MD  Electronically signed by Shirlee More MD  Signature Date/Time: 10/19/2019/1:10:27 PM      RIGHT HEART CATH AND CORONARY/GRAFT ANGIOGRAPHY  Conclusion  1.  Severe multivessel coronary artery disease with occlusion of the left coronary artery and severe calcific stenosis of the right coronary artery 2.  Status post aortocoronary bypass surgery with continued patency of the LIMA to LAD graft and saphenous vein graft OM, atretic RIMA to RCA 3.  Known severe aortic stenosis by noninvasive assessment  Recommendations: Continue evaluation for treatment of severe aortic stenosis, likely TAVR.  Will review with multidisciplinary heart team and likely proceed with atherectomy and stenting of the RCA via radial access followed by TAVR once his imaging studies are completed.  Indications  Severe aortic stenosis [I35.0 (ICD-10-CM)]  Procedural Details  Technical  Details INDICATION: Severe symptomatic aortic stenosis.  This 79 year old gentleman underwent multivessel CABG in 1997 at [facility].  He was reportedly treated with a RIMA to RCA, LIMA to LAD, and saphenous vein graft OM.  He has developed progressive and now severe symptomatic aortic stenosis and is referred for preoperative right and left heart catheterization.  He has New York Heart Association functional class III symptoms of dyspnea with exertion.  PROCEDURAL DETAILS: The right groin is prepped, draped, and anesthetized with 1% lidocaine. Using direct ultrasound guidance a 5 French sheath is placed in the right femoral artery and a 7 French sheath is placed in the right femoral vein. [place] images are captured and stored in the patient's chart. A Swan-Ganz catheter is used for the right heart catheterization. Standard protocol is followed for recording of right heart pressures and sampling of oxygen saturations. Fick cardiac output is calculated. Standard Judkins catheters are used for selective coronary angiography and bypass graft angiography.  There is some difficulty passing wires and catheters through the aortoiliac system because of heavy calcification.  It is also difficult to torque the catheters.  The 5 French 11 cm sheath is changed out for a 6 French 25 cm sheath over a 0.035 inch wire.  5 [place] guide catheter is used to try to selectively cannulate the left coronary artery.  The vessel is imaged nonselectively and appears to be occluded.  There are no immediate procedural complications. The patient is transferred to the post catheterization recovery area for further monitoring.    Estimated blood loss <50 mL.   During this procedure medications were administered to achieve and maintain moderate conscious sedation while the patient's heart rate, blood pressure, and oxygen saturation were continuously monitored and I was present face-to-face 100% of this time.  Medications (Filter:  Administrations occurring from 11/26/19 1156 to 11/26/19 1334) (important)  Continuous medications are totaled by the amount administered until 11/26/19 1334.  Heparin (Porcine) in NaCl 1000-0.9 UT/500ML-% SOLN (mL) Total volume:  1,000 mL Date/Time  Rate/Dose/Volume Action  11/26/19 1214  500 mL Given  1214  500 mL Given    fentaNYL (SUBLIMAZE) injection (mcg) Total dose:  25 mcg Date/Time  Rate/Dose/Volume Action  11/26/19 1226  25 mcg Given    midazolam (VERSED) injection (mg) Total dose:  1 mg Date/Time  Rate/Dose/Volume Action  11/26/19 1226  1 mg Given    lidocaine (PF) (XYLOCAINE) 1 % injection (mL) Total volume:  16 mL Date/Time  Rate/Dose/Volume Action  11/26/19 1234  16 mL Given    iohexol (OMNIPAQUE) 350 MG/ML injection (mL) Total volume:  115 mL Date/Time  Rate/Dose/Volume Action  11/26/19 1324  115 mL Given    Sedation Time  Sedation Time Physician-1: 52 minutes 32 seconds  Contrast  Medication Name Total Dose  iohexol (OMNIPAQUE) 350 MG/ML injection 115 mL    Radiation/Fluoro  Fluoro time: 17.4 (min) DAP: 22.2 (Gycm2) Cumulative Air Kerma: 337.5 (mGy)  Coronary Findings  Diagnostic Dominance: Right Left Main  Ost LM to Dist LM lesion 100% stenosed  Ost LM to Dist LM lesion is 100% stenosed. The lesion is severely calcified.  Right Coronary Artery  Prox RCA lesion 40% stenosed  Prox RCA lesion is 40% stenosed. The lesion is calcified.  Mid RCA lesion 95% stenosed  Mid RCA lesion is 95% stenosed. The lesion is eccentric. The lesion is severely calcified.  LIMA LIMA Graft To Mid LAD  LIMA and is normal in caliber. The graft exhibits no disease.  Saphenous Graft To 1st Mrg  SVG and is normal in caliber. The graft exhibits no disease. Widely patent SVG-OM, fills the entire LCx distribution  RIMA RIMA Graft To Dist RCA  RIMA. The graft is atretic. Atretic RIMA graft  Intervention  No interventions have been documented. Coronary  Diagrams  Diagnostic Dominance: Right  Intervention  Implants   Vascular Products  Closure Mynx Control [id number] -- [id number] Implanted  Inventory item: CLOSURE [facility] CONTROL 22F/63F Model/Cat number: [id number]  Manufacturer: [place] Lot number: [id number]  Device identifier: [id number] Device identifier type: GS1  GUDID Information  Request status Successful    Brand name: [facility] CONTROL Version/Model: [id number]  Company name: [place]. MRI safety info as of 11/26/19: MR Safe  Contains dry or latex rubber: No    GMDN P.T. name: Wound hydrogel dressing, non-antimicrobial    As of 11/26/2019  Status: Implanted      Syngo Images  Show images for CARDIAC CATHETERIZATION  Images on Long Term Storage  Show images for Mounir, Skipper to Procedure Log  Procedure Log    Hemo Data   Most Recent Value  Fick Cardiac Output 5.09 L/min  Fick Cardiac Output Index 3.02 (L/min)/BSA  RA A Wave -99 mmHg  RA V Wave 11 mmHg  RA Mean 6 mmHg  RV Systolic Pressure 56 mmHg  RV Diastolic Pressure -1 mmHg  RV EDP 4 mmHg  PA Systolic Pressure 59 mmHg  PA Diastolic Pressure 21 mmHg  PA Mean 37 mmHg  PW A Wave 19 mmHg  PW V Wave 24 mmHg  PW Mean 17 mmHg  AO Systolic Pressure 465 mmHg  AO Diastolic Pressure 52 mmHg  AO Mean 74 mmHg  QP/QS 1  TPVR Index 12.26 HRUI  TSVR Index 24.52 HRUI  PVR SVR Ratio 0.29  TPVR/TSVR Ratio 0.5        CORONARY ATHERECTOMY         Conclusion      Successful orbital atherectomy and stenting of the RCA with 3 overlapping DES, complicated by small coronary perforation requiring covered stent placement (4.0x20 mm Papyrus)         Recommendations   Antiplatelet/Anticoag Recommend dual antiplatelet therapy with Aspirin 63m daily and Clopidogrel 732mdaily long-term (beyond 12 months) because of covered stent placement.          Indications   Coronary artery disease involving native coronary artery of  native heart with angina pectoris ([facility][I25.119 (ICD-10-CM)]        Procedural Details   Technical Details INDICATION: Coronary artery disease with angina.  [age]ear old gentleman with previous CABG, undergoing TAVR evaluation for severe symptomatic aortic stenosis.  Preprocedure cardiac catheterization demonstrated critical calcific stenosis of the mid RCA and the patient presents today for atherectomy and stenting.  The RCA is a diffusely calcified and moderately diseased vessel.  PROCEDURAL DETAILS: The right wrist is prepped, draped, and anesthetized with 1% lidocaine. Using the modified Seldinger technique, a 5/6 French Slender sheath is introduced into the right radial artery. 3 mg of verapamil is administered through the sheath, weight-based unfractionated heparin was administered intravenously.  Initially, a 6 FrQataruide is used.  Heparin is administered for anticoagulation and a therapeutic ACT is achieved.  A Viper flex wire is advanced across the lesion into the distal RCA once a therapeutic ACT is achieved.  A CSI orbital atherectomy catheter is prepped using normal technique and a 1.25 mm crown is advanced into the mid vessel.  Multiple runs were made at low and high speed in order to adequately treat the calcified lesion in the mid vessel.  The device is removed without complication.  A 3.0 mm balloon is then advanced with a moderate amount of difficulty into the lesion site and it is dilated to 8 atm.  I tried to advance a 3.5 x 18 mm resolute Onyx DES but it  would not cross the proximal vessel due to calcification and moderate stenosis.  After further angiographic evaluation, I elected to treat the proximal vessel as I felt there was hemodynamically significant disease and also did not feel the mid lesion could be stented without treating the proximal vessel.  Guide catheter exchanged out for a 6 [place] AL-1 guide.  The Viper flex wire was again advanced into the distal vessel.   Atherectomy is again performed using a CSI 1.25 mm crown at low and high speed.  A 3.0 balloon is again advanced and dilated over the proximal segment.  I decided to treat back to the ostium with a 3.5 x 38 mm resolute Onyx DES deployed at 16 atm.  There was a short gap between the 2 stents and this is covered with a 3.5 x 12 mm resolute Onyx DES.  After stenting the vessel was postdilated with a 3.75 mm noncompliant balloon.  Further angiography demonstrates a small amount of dye extravasation through the proximal stented segment consistent with a perforation.  The patient remained hemodynamically stable but began to have more chest pain.  I elected to treat the segment with a 4.0 x 20 mm Papyrus covered stent.  The stent is deployed at burst pressure of 14 atm.  There continued to be a small amount of extravasation with modest improvement after the covered stent.  Therefore, the stent is postdilated with a 4.5 mm noncompliant balloon in order to obtain a better seal.  After noncompliant balloon postdilatation there is minimal dye seen at the perforation site.  The patient remained hemodynamically stable throughout.  A stat echocardiogram was performed in the cardiac catheterization lab and there is no pericardial effusion present.  The guide catheter and wire were removed and a TR band is used for radial hemostasis at the completion of the procedure.  The patient was transferred to the post catheterization recovery area for further monitoring.    Estimated blood loss <50 mL.   During this procedure medications were administered to achieve and maintain moderate conscious sedation while the patient's heart rate, blood pressure, and oxygen saturation were continuously monitored and I was present face-to-face 100% of this time.        Medications   (Filter: Administrations occurring from 12/24/19 1049 to 12/24/19 1337)      Continuous medications are totaled by the amount administered until  12/24/19 1337.      Heparin (Porcine) in NaCl 1000-0.9 UT/500ML-% SOLN (mL) Total volume:  1,000 mL      Date/Time     Rate/Dose/Volume  Action  12/24/19 1120  500 mL Given  1120  500 mL Given        midazolam (VERSED) injection (mg) Total dose:  4 mg      Date/Time     Rate/Dose/Volume  Action  12/24/19 1121  1 mg Canceled Entry  1121  1 mg Canceled Entry  1122  1 mg Given  1203  1 mg Given  1254  1 mg Given  1319  1 mg Given  1334  1 mg Canceled Entry        fentaNYL (SUBLIMAZE) injection (mcg) Total dose:  100 mcg      Date/Time     Rate/Dose/Volume  Action  12/24/19 1122  25 mcg Given  1203  25 mcg Given  1254  25 mcg Given  1312  25 mcg Given        lidocaine (PF) (XYLOCAINE) 1 % injection (mL) Total volume:  2 mL      Date/Time     Rate/Dose/Volume  Action  12/24/19 1128  2 mL Given        Radial Cocktail/Verapamil only (mL) Total volume:  10 mL      Date/Time     Rate/Dose/Volume  Action  12/24/19 1129  10 mL Given        heparin sodium (porcine) injection (Units) Total dose:  12,000 Units      Date/Time     Rate/Dose/Volume  Action  12/24/19 1130  6,000 Units Given  1145  3,000 Units Given  1232  3,000 Units Given        nitroGLYCERIN 1 mg/10 mL (100 mcg/mL) - IR/CATH LAB (mcg) Total dose:  500 mcg      Date/Time     Rate/Dose/Volume  Action  12/24/19 1134  200 mcg Given  1149  150 mcg Given  1235  150 mcg Given        iohexol (OMNIPAQUE) 350 MG/ML injection (mL) Total volume:  150 mL      Date/Time     Rate/Dose/Volume  Action  12/24/19 1315  150 mL Given        Heparin (Porcine) in NaCl 1000-0.9 UT/500ML-% SOLN (mL) Total volume:  500 mL      Date/Time     Rate/Dose/Volume  Action  12/24/19 1120  500 mL Given        sodium chloride 0.9 % 1,000 mL with ViperSlide Lubricant 1 each Total dose:  Cannot be calculated*     *Administration dose not documented    Date/Time     Rate/Dose/Volume  Action  12/24/19 1330   Given        sodium chloride 0.9 % 50 mL with aminophylline 325 mg infusion (mL/hr) Total dose:  Cannot be calculated* Dosing weight:  64.4    *Continuous medication not stopped within the calculation time range.    Date/Time     Rate/Dose/Volume  Action  12/24/19 1102  300 mL/hr New Bag/Given          Sedation Time      Sedation Time Physician-1: 1 hour 50 minutes 57 seconds           Contrast       Medication Name   Total Dose    iohexol (OMNIPAQUE) 350 MG/ML injection   150 mL           Radiation/Fluoro      Fluoro time: 27 (min)  DAP: 34.8 (Gycm2)  Cumulative Air Kerma: 749.9 (mGy)           Coronary Findings     Diagnostic Dominance: Right   Left Main  Ost LM to Dist LM lesion 100% stenosed  Ost LM to Dist LM lesion is 100% stenosed. The lesion is severely calcified.   Right Coronary Artery  Prox RCA lesion 60% stenosed  Prox RCA lesion is 60% stenosed. The lesion is calcified.  Prox RCA to Mid RCA lesion 50% stenosed  Prox RCA to Mid RCA lesion is 50% stenosed. The lesion is calcified.  Mid RCA lesion 95% stenosed  Mid RCA lesion is 95% stenosed. The lesion is eccentric. The lesion is severely calcified.   LIMA LIMA Graft To Mid LAD  LIMA and is normal in caliber. The graft exhibits no disease.   Saphenous Graft To 1st Mrg  SVG and is normal in caliber. The graft exhibits no disease. Widely patent SVG-OM, fills the entire LCx distribution  RIMA RIMA Graft To Dist RCA  RIMA. The graft is atretic. Atretic RIMA graft     Intervention   Prox RCA lesion  Stent  Pre-stent angioplasty was performed using a BALLOON SAPPHIRE 3.0X12. A drug-eluting stent was successfully placed using a STENT RESOLUTE ONYX 3.5X38.  Post-Intervention Lesion Assessment  The intervention was successful.  Pre-interventional TIMI flow is 3. Post-intervention TIMI flow is 3. At this lesion, a perforation of the vessel occurred. Coronary Perforation treated with a Papyrus covered stent  There is a 0% residual stenosis post intervention.   Prox RCA to Mid RCA lesion  Stent  A drug-eluting stent was successfully placed using a STENT RESOLUTE ONYX 3.5X12.  Post-Intervention Lesion Assessment  The intervention was successful. Pre-interventional TIMI flow is 3. Post-intervention TIMI flow is 3. No complications occurred at this lesion.  There is a 0% residual stenosis post intervention.   Mid RCA lesion  Stent  A drug-eluting stent was successfully placed using a STENT RESOLUTE ONYX 3.5X18.  Post-Intervention Lesion Assessment  The intervention was successful. Pre-interventional TIMI flow is 3. Post-intervention TIMI flow is 3. No complications occurred at this lesion.  There is a 0% residual stenosis post intervention.                        Coronary Diagrams     Diagnostic Dominance: Right   Diagnostic Image    Intervention   Intervention Image          Implants      Permanent Stent      Stent Resolute Onyx 3.5x18 - [id number] - Implanted    Inventory item: Loma Sousa 3.5X18 Model/Cat number: [id number]  Manufacturer: [place] Lot number: [id number]  Device identifier: [id number] Device identifier type: GS1  Area Of Implantation: Mid RCA       GUDID Information   Request status Successful     Brand name: Resolute OnyxT Version/Model: [place] name: [employer] MRI safety info as of 12/24/19: MR Conditional  Contains dry or latex rubber: No    GMDN P.T. name: Drug-eluting coronary artery stent, non-bioabsorbable-polymer-coated       As of 12/24/2019   Status: Implanted          Stent Resolute Onyx 3.5x38 - [id number] - Implanted    Inventory item: Loma Sousa 0.8Q76  Model/Cat number: [id number]  Manufacturer: [place] Lot number: [id number]  Device identifier: [id number] Device identifier type: GS1  Area Of Implantation: Prox RCA       GUDID Information   Request status Successful     Brand name: Resolute OnyxT Version/Model: [id number]  Company name: [employer] MRI safety info as of 12/24/19: MR Conditional  Contains dry or latex rubber: No    GMDN P.T. name: Drug-eluting coronary artery stent, non-bioabsorbable-polymer-coated       As of 12/24/2019   Status: Implanted          Stent Resolute Onyx 3.5x12 - [id number] - Implanted    Inventory item: Loma Sousa 9.6Q22 Model/Cat number: [id number]  Manufacturer: [place] Lot number: [id number]  Device identifier: [id number] Device identifier type: GS1  Area Of Implantation: Mid RCA       GUDID Information   Request status Successful     Brand name: Resolute OnyxT Version/Model: [id number]  Company name: [employer] MRI safety info as of 12/24/19: MR Conditional  Contains dry or latex rubber: No    GMDN P.T. name:  Drug-eluting coronary artery stent, non-bioabsorbable-polymer-coated       As of 12/24/2019   Status: Implanted          Stent Pk Papyrus 4.0x20 - [id number] - Implanted    Inventory item: STENT PK PAPYRUS 4.0X20 Model/Cat number: [id number]  Manufacturer: Griselda Miner Lot number: [id number]  Device identifier: [id number] Device identifier type: GS1  Area Of Implantation: Prox RCA       GUDID Information   Request status Successful     Brand name: PK Papyrus Version/Model: 4.0/20/140  Company name: [employer] MRI safety info as of 12/24/19: MR Conditional  Contains dry or latex rubber: No    GMDN P.T. name: Bare-metal coronary artery stent       As of 12/24/2019   Status: Implanted       Cardiac TAVR CT  TECHNIQUE: The patient was scanned on a Siemens Force 779 slice  scanner. A 120 kV retrospective scan was triggered in the descending thoracic aorta at 111 HU's. Gantry rotation speed was 270 msecs and collimation was .9 mm. No beta blockade or nitro were given. The 3D data set was reconstructed in 5% intervals of the R-R cycle. Systolic and diastolic phases were analyzed on a dedicated work station using MPR, MIP and VRT modes. The patient received 163m OMNIPAQUE IOHEXOL 350 MG/ML SOLN of contrast.  FINDINGS: Motion artifact reduces the diagnostic quality of this exam.  Aortic Valve: Tricuspid aortic valve. Severely reduced cusp separation. Severely thickened, severely calcified aortic valve cusps.  AV calcium score: 1593  Annulus measurements made at 5% of R-R interval to reduce motion artifact.  Virtual Basal Annulus Measurements:  Maximum/Minimum Diameter: 27.8 x 21.2 mm  Perimeter: 78.3 mm  Area: 466 mm2  LVOT calcification present.  Based on these measurements, the annulus would be suitable for a 26 mm Sapien 3 valve.  Sinus of Valsalva Measurements:  Non-coronary:  33 mm  Right - coronary:  31 mm  Left - coronary:  32 mm  Sinus of Valsalva Height:  Left: 20 mm  Right: 20.5 mm  Aorta: Severe calcifications.  Sinotubular Junction:  28 mm  Ascending Thoracic Aorta:  38 mm  Aortic Arch:  28 mm  Descending Thoracic Aorta:  29 mm  Coronary Artery Height above Annulus:  Left Main: 16.5 mm. Left main is severely calcified and is known to be occluded.  Right Coronary: 15.3 mm  Coronary Arteries: Severe 3 vessel coronary artery calcifications. S/p CABG. LIMA graft to LAD and SVG to L circumflex system appear patent.  Optimum Fluoroscopic Angle for Delivery: RAO 0, CRA 1  No left atrial appendage thrombus.  IMPRESSION: 1. Tricuspid aortic valve. Severely reduced cusp separation. Severely thickened, severely calcified aortic valve cusps.  2.  AV calcium score 1593  3. Annulus  area: 466 mm2. The annulus would be suitable for a 26 mm Sapien 3 valve. LVOT calcifications.  4. Adequate right coronary height from the annulus. Left coronary artery height is adequate but is known to be occluded.  5.  Borderline mid ascending aorta dilation, 38 mm.  6. Optimum Fluoroscopic Angle for Delivery: RAO 0, CRA 1   Electronically Signed   By: GCherlynn Kaiser  On: 12/12/2019 18:39    CT ANGIOGRAPHY CHEST, ABDOMEN AND PELVIS  TECHNIQUE: Multidetector CT imaging through the chest, abdomen and pelvis was performed using the standard protocol during bolus administration of intravenous contrast. Multiplanar reconstructed images and MIPs were obtained and reviewed to evaluate the vascular anatomy.  CONTRAST:  161m OMNIPAQUE IOHEXOL 350 MG/ML SOLN  COMPARISON:  CT the abdomen and pelvis 02/18/2012.  FINDINGS: CTA CHEST FINDINGS  Cardiovascular: Heart size is mildly enlarged. There is no significant pericardial fluid, thickening or pericardial calcification. There is aortic atherosclerosis, as well as atherosclerosis of the great vessels of the mediastinum and the coronary arteries, including calcified atherosclerotic plaque in the left main, left anterior descending, left circumflex and right coronary arteries. Status post median sternotomy for CABG including LIMA to the LAD. Severe thickening and calcification of the aortic valve. Calcifications of the mitral annulus and mitral valve.  Mediastinum/Lymph Nodes: No pathologically enlarged mediastinal or hilar lymph nodes. Moderate hiatal hernia. No axillary lymphadenopathy.  Lungs/Pleura: Widespread areas of mild ground-glass attenuation and interlobular septal thickening noted throughout the lungs bilaterally, favored to reflect a background of mild pulmonary edema. Trace bilateral pleural effusions lying dependently (right greater than left). No confluent consolidative airspace disease.  No suspicious appearing pulmonary nodules or masses are noted.  Musculoskeletal/Soft Tissues: Median sternotomy wires. There are no aggressive appearing lytic or blastic lesions noted in the visualized portions of the skeleton. Multiple vertebral body compression fractures at the T3, T7 and T8, most severe at T3 where there is 30% loss of anterior vertebral body height.  CTA ABDOMEN AND PELVIS FINDINGS  Hepatobiliary: Liver has a nodular contour, which may suggest early changes of cirrhosis. No suspicious cystic or solid hepatic lesions. No intra or extrahepatic biliary ductal dilatation. Gallbladder is normal in appearance.  Pancreas: Diffuse pancreatic atrophy. No pancreatic mass. No pancreatic ductal dilatation. No pancreatic or peripancreatic fluid collections or inflammatory changes.  Spleen: Unremarkable.  Adrenals/Urinary Tract: 1.7 cm low-attenuation lesion in the lower pole the right kidney, compatible with a simple cyst. Left kidney and bilateral adrenal glands are normal in appearance. No hydroureteronephrosis. Urinary bladder is normal in appearance.  Stomach/Bowel: Normal appearance of the intra-abdominal portion of the stomach. No pathologic dilatation of small bowel or colon. Numerous colonic diverticulae are noted, without definite surrounding inflammatory changes to suggest an acute diverticulitis at this time. Normal appendix.  Vascular/Lymphatic: Aortic atherosclerosis, with vascular findings and measurements pertinent to potential TAVR procedure, as detailed below. No aneurysm or dissection noted in the abdominal or pelvic vasculature. No lymphadenopathy noted in the abdomen or pelvis.  Reproductive: Prostate gland and seminal vesicles are unremarkable in appearance.  Other: Trace volume of ascites noted in the visualized portions of the peritoneal cavity. No pneumoperitoneum.  Musculoskeletal: Compression fractures are noted at L1 and L4,  with post vertebroplasty changes at L4. There are no aggressive appearing lytic or blastic lesions noted in the visualized portions of the skeleton.  VASCULAR MEASUREMENTS PERTINENT TO TAVR:  AORTA:  Minimal Aortic Diameter-14 x 11 mm  Severity of Aortic Calcification-severe  RIGHT PELVIS:  Right Common Iliac Artery -  Minimal Diameter-6.1 x 4.2 mm  Tortuosity-mild-to-moderate  Calcification-severe  Right External Iliac Artery -  Minimal Diameter-7.7 x 2.9 mm  Tortuosity-moderate  Calcification-moderate  Right Common Femoral Artery -  Minimal Diameter-6.7 x 1.7 mm  Tortuosity-mild  Calcification-moderate  LEFT PELVIS:  Left Common Iliac Artery -  Minimal Diameter-3.7 x 2.1 mm  Tortuosity-mild-to-moderate  Calcification-severe  Left External Iliac Artery -  Minimal Diameter-5.7 x 7.2 mm  Tortuosity-moderate  Calcification-mild-to-moderate  Left Common Femoral Artery -  Minimal Diameter-7.7 x 3.2 mm  Tortuosity-mild  Calcification-moderate  Review of the MIP images confirms the above findings.  IMPRESSION: 1. Vascular findings and measurements pertinent to potential TAVR procedure, as  detailed above. 2. Severe thickening calcification of the aortic valve, compatible with reported clinical history of severe aortic stenosis. 3. Aortic atherosclerosis, in addition to left main and 3 vessel coronary artery disease. Status post median sternotomy for CABG including LIMA to the LAD. 4. Mild cardiomegaly with findings suggestive of mild congestive heart failure, as above. 5. The appearance of the liver suggests underlying cirrhosis. 6. Diffuse pancreatic atrophy. 7. Severe colonic diverticulosis without evidence of acute diverticulitis at this time. 8. Additional incidental findings, as above.   Electronically Signed   By: Vinnie Langton M.D.   On: 12/08/2019 14:09   EKG:     Atrial fibrillation w/ RBBB  and LAFB    STS Risk Calculator  AVR + CAB   Risk of Mortality:20.050%  Renal Failure: 8.881%  Permanent Stroke:4.398%  Prolonged Ventilation: 36.389%  DSW Infection:0.227%  Reoperation:12.241%  Morbidity or Mortality: 46.431%  Short Length of Stay: 8.609%  Long Length of Stay: 26.250%     Impression:  Patient has stage D severe symptomatic aortic stenosis and multivessel coronary artery disease.  He describes a slow gradual progression of symptoms of exertional shortness of breath and fatigue which have gotten considerably worse over the last few months and now occasionally occur at rest, consistent with an acute exacerbation of chronic diastolic congestive heart failure, New York Heart Association functional class IIIb.  He is not currently having symptoms of chest pain or chest tightness suspicious for angina pectoris and he otherwise currently appears to be doing well since his recent PCI and stenting of the right coronary artery.  I have personally reviewed the patient's recent transthoracic echocardiogram, diagnostic cardiac catheterization, and CT angiograms.  Echocardiograms reveal severe aortic stenosis with preserved left ventricular systolic function.  The aortic valve is trileaflet with severe thickening, calcification, and restricted leaflet mobility involving all 3 leaflets.  Peak velocity across the aortic valve measured close to 4.0 m/s corresponding to mean transvalvular gradient estimated 39 mmHg and aortic valve area calculated 0.57 cm.  DVI was notably quite low at 0.20.  The patient also has mild to moderate aortic insufficiency and mild to moderate mitral regurgitation.  Diagnostic cardiac catheterization revealed severe native coronary artery disease with chronic occlusion of the left coronary system but continued patency of the left internal mammary artery graft to the left anterior descending coronary artery and saphenous vein graft to the obtuse marginal branch.   The patient had isolated high-grade stenosis of the mid right coronary artery which has been successfully treated with PCI and stenting.  I agree the patient would benefit from aortic valve replacement.  I would be reluctant to consider this elderly gentleman a candidate for aortic valve replacement via conventional surgical redo sternotomy with or without concomitant redo coronary artery bypass grafting.  Cardiac-gated CTA of the heart reveals anatomical characteristics consistent with aortic stenosis suitable for treatment by transcatheter aortic valve replacement without any significant complicating features.  CTA of the aorta and iliac vessels demonstrates significant atherosclerotic disease in the pelvic vasculature but what may be adequate pelvic vascular access to facilitate a transfemoral approach.  Left subclavian approach could be utilized if transfemoral approach is not adequate, although this would require passing the cannula across the origin of the left internal mammary artery graft.  I would be reluctant to consider transcarotid approach given the patient's cerebrovascular disease.  Transapical approach can be considered if transfemoral or left subclavian approach is or not feasible.  Patient will be at relatively high risk  for need for permanent pacemaker placement due to the presence of baseline right bundle branch block and left anterior fascicular block.    Plan:  The patient and his wife were counseled at length regarding treatment alternatives for management of severe symptomatic aortic stenosis. Alternative approaches such as conventional aortic valve replacement, transcatheter aortic valve replacement, and continued medical therapy without intervention were compared and contrasted at length.  The risks associated with conventional surgical aortic valve replacement were discussed in detail, as were expectations for post-operative convalescence, and why I would be reluctant to consider  this patient a candidate for conventional surgery.  Issues specific to transcatheter aortic valve replacement were discussed including questions about long term valve durability, the potential for paravalvular leak, possible increased risk of need for permanent pacemaker placement, and other technical complications related to the procedure itself.  Long-term prognosis with medical therapy was discussed. This discussion was placed in the context of the patient's own specific clinical presentation and past medical history.  All of their questions have been addressed.  The patient hopes to proceed with transcatheter aortic valve replacement as soon as possible.  We tentatively plan for surgery on Jan 18, 2020.  Following the decision to proceed with transcatheter aortic valve replacement, a discussion has been held regarding what types of management strategies would be attempted intraoperatively in the event of life-threatening complications, including whether or not the patient would be considered a candidate for the use of cardiopulmonary bypass and/or conversion to open sternotomy for attempted surgical intervention.  The patient specifically requests that should a potentially life-threatening complication develop we would not attempt emergency median sternotomy and/or other aggressive surgical procedures.  The patient has been advised of a variety of complications that might develop including but not limited to risks of death, stroke, paravalvular leak, aortic dissection or other major vascular complications, aortic annulus rupture, device embolization, cardiac rupture or perforation, mitral regurgitation, acute myocardial infarction, arrhythmia, heart block or bradycardia requiring permanent pacemaker placement, congestive heart failure, respiratory failure, renal failure, pneumonia, infection, other late complications related to structural valve deterioration or migration, or other complications that might  ultimately cause a temporary or permanent loss of functional independence or other long term morbidity.  The patient provides full informed consent for the procedure as described and all questions were answered.     I spent in excess of 90 minutes during the conduct of this office consultation and >50% of this time involved direct face-to-face encounter with the patient for counseling and/or coordination of their care.   Valentina Gu. Roxy Manns, MD 01/10/2020 9:50 AM

## 2020-01-11 ENCOUNTER — Other Ambulatory Visit: Payer: Self-pay

## 2020-01-11 DIAGNOSIS — I350 Nonrheumatic aortic (valve) stenosis: Secondary | ICD-10-CM

## 2020-01-12 NOTE — Progress Notes (Signed)
[facility] [phone] - [place], [place] - [address]  [place] [address] Phone: [phone] Fax: [phone]  [facility] - [place], [place] - [address] [facility] [address] [place] [address] [place] [address] Phone: [phone] Fax: [phone]      Your procedure is scheduled on Jan 18, 2020.  Report to [facility] Main Entrance "A" at 8:45 A.M., and check in at the Admitting office.  Call this number if you have problems the morning of surgery:  [phone]  Call [phone] if you have any questions prior to your surgery date Monday-Friday 8am-4pm    Remember:  Do not eat or drink after midnight the night before your surgery   Stop Metformin 5/9.  You will take last dose on 5/8 (Saturday).  Continue taking all other medication without change through the day before surgery.  On the morning of surgery, take only Protonix with a sip of water.   As of today, STOP taking any Aspirin (unless otherwise instructed by your surgeon) and Aspirin containing products, Aleve, Naproxen, Ibuprofen, Motrin, Advil, Goody's, BC's, all herbal medications, fish oil, and all vitamins.   WHAT DO I DO ABOUT MY DIABETES MEDICATION?   Marland Kitchen Do not take oral diabetes medicines (pills) the morning of surgery.  Stop Metformin on 5/9.  You will take last dose on 5/8 (Saturday)  HOW TO MANAGE YOUR DIABETES BEFORE AND AFTER SURGERY  Why is it important to control my blood sugar before and after surgery? . Improving blood sugar levels before and after surgery helps healing and can limit problems. . A way of improving blood sugar control is eating a healthy diet by: o  Eating less sugar and carbohydrates o  Increasing activity/exercise o  Talking with your doctor about reaching your blood sugar goals . High blood sugars (greater than 180 mg/dL) can raise your risk of infections and slow your recovery, so you will need to focus on controlling your diabetes  during the weeks before surgery. . Make sure that the doctor who takes care of your diabetes knows about your planned surgery including the date and location.  How do I manage my blood sugar before surgery? . Check your blood sugar at least 4 times a day, starting 2 days before surgery, to make sure that the level is not too high or low. . Check your blood sugar the morning of your surgery when you wake up and every 2 hours until you get to the Short Stay unit. o If your blood sugar is less than 70 mg/dL, you will need to treat for low blood sugar: - Do not take insulin. - Treat a low blood sugar (less than 70 mg/dL) with  cup of clear juice (cranberry or apple), 4 glucose tablets, OR glucose gel. - Recheck blood sugar in 15 minutes after treatment (to make sure it is greater than 70 mg/dL). If your blood sugar is not greater than 70 mg/dL on recheck, call [phone] for further instructions. . Report your blood sugar to the short stay nurse when you get to Short Stay.  . If you are admitted to the hospital after surgery: o Your blood sugar will be checked by the staff and you will probably be given insulin after surgery (instead of oral diabetes medicines) to make sure you have good blood sugar levels. o The goal for blood sugar control after surgery is 80-180 mg/dL.  Do not wear jewelry            Do not wear lotions, powders, colognes, or deodorant.            Do not shave 48 hours prior to surgery.  Men may shave face and neck.            Do not bring valuables to the hospital.            [facility] is not responsible for any belongings or valuables.  Do NOT Smoke (Tobacco/Vapping) or drink Alcohol 24 hours prior to your procedure If you use a CPAP at night, you may bring all equipment for your overnight stay.   Contacts, glasses, dentures or bridgework may not be worn into surgery.      For patients admitted to the hospital, discharge time will be  determined by your treatment team.   Patients discharged the day of surgery will not be allowed to drive home, and someone needs to stay with them for 24 hours.    Special instructions:   [facility]- Preparing For Surgery  Before surgery, you can play an important role. Because skin is not sterile, your skin needs to be as free of germs as possible. You can reduce the number of germs on your skin by washing with CHG (chlorahexidine gluconate) Soap before surgery.  CHG is an antiseptic cleaner which kills germs and bonds with the skin to continue killing germs even after washing.    Oral Hygiene is also important to reduce your risk of infection.  Remember - BRUSH YOUR TEETH THE MORNING OF SURGERY WITH YOUR REGULAR TOOTHPASTE  Please do not use if you have an allergy to CHG or antibacterial soaps. If your skin becomes reddened/irritated stop using the CHG.  Do not shave (including legs and underarms) for at least 48 hours prior to first CHG shower. It is OK to shave your face.  Please follow these instructions carefully.   1. Shower the NIGHT BEFORE SURGERY and the MORNING OF SURGERY with CHG Soap.   2. If you chose to wash your hair, wash your hair first as usual with your normal shampoo.  3. After you shampoo, rinse your hair and body thoroughly to remove the shampoo.  4. Use CHG as you would any other liquid soap. You can apply CHG directly to the skin and wash gently with a scrungie or a clean washcloth.   5. Apply the CHG Soap to your body ONLY FROM THE NECK DOWN.  Do not use on open wounds or open sores. Avoid contact with your eyes, ears, mouth and genitals (private parts). Wash Face and genitals (private parts)  with your normal soap.   6. Wash thoroughly, paying special attention to the area where your surgery will be performed.  7. Thoroughly rinse your body with warm water from the neck down.  8. DO NOT shower/wash with your normal soap after using and rinsing off the CHG  Soap.  9. Pat yourself dry with a CLEAN TOWEL.  10. Wear CLEAN PAJAMAS to bed the night before surgery, wear comfortable clothes the morning of surgery  11. Place CLEAN SHEETS on your bed the night of your first shower and DO NOT SLEEP WITH PETS.   Day of Surgery:   Do not apply any deodorants/lotions.  Please wear clean clothes to the hospital/surgery center.   Remember to brush your teeth WITH YOUR REGULAR TOOTHPASTE.   Please read over the following fact sheets  that you were given.

## 2020-01-13 ENCOUNTER — Other Ambulatory Visit: Payer: Self-pay

## 2020-01-13 ENCOUNTER — Encounter (HOSPITAL_COMMUNITY): Payer: Self-pay

## 2020-01-13 ENCOUNTER — Encounter: Payer: Self-pay | Admitting: Physical Therapy

## 2020-01-13 ENCOUNTER — Ambulatory Visit (HOSPITAL_COMMUNITY)
Admission: RE | Admit: 2020-01-13 | Discharge: 2020-01-13 | Disposition: A | Discharge status: Home / Self Care | Payer: Medicare Other | Source: Ambulatory Visit | Attending: Cardiovascular Disease | Admitting: Cardiovascular Disease

## 2020-01-13 ENCOUNTER — Encounter (HOSPITAL_COMMUNITY)
Admission: RE | Admit: 2020-01-13 | Discharge: 2020-01-13 | Disposition: A | Discharge status: Home / Self Care | Payer: Medicare Other | Source: Ambulatory Visit | Attending: Cardiovascular Disease | Admitting: Cardiovascular Disease

## 2020-01-13 ENCOUNTER — Ambulatory Visit: Payer: Medicare Other | Attending: Physician Assistant | Admitting: Physical Therapy

## 2020-01-13 DIAGNOSIS — Z7902 Long term (current) use of antithrombotics/antiplatelets: Secondary | ICD-10-CM | POA: Insufficient documentation

## 2020-01-13 DIAGNOSIS — Z7982 Long term (current) use of aspirin: Secondary | ICD-10-CM | POA: Insufficient documentation

## 2020-01-13 DIAGNOSIS — Z9981 Dependence on supplemental oxygen: Secondary | ICD-10-CM | POA: Insufficient documentation

## 2020-01-13 DIAGNOSIS — E785 Hyperlipidemia, unspecified: Secondary | ICD-10-CM | POA: Insufficient documentation

## 2020-01-13 DIAGNOSIS — N189 Chronic kidney disease, unspecified: Secondary | ICD-10-CM | POA: Insufficient documentation

## 2020-01-13 DIAGNOSIS — Z01818 Encounter for other preprocedural examination: Secondary | ICD-10-CM | POA: Insufficient documentation

## 2020-01-13 DIAGNOSIS — Z79899 Other long term (current) drug therapy: Secondary | ICD-10-CM | POA: Diagnosis not present

## 2020-01-13 DIAGNOSIS — Z955 Presence of coronary angioplasty implant and graft: Secondary | ICD-10-CM | POA: Diagnosis not present

## 2020-01-13 DIAGNOSIS — Z7984 Long term (current) use of oral hypoglycemic drugs: Secondary | ICD-10-CM | POA: Diagnosis not present

## 2020-01-13 DIAGNOSIS — I350 Nonrheumatic aortic (valve) stenosis: Secondary | ICD-10-CM

## 2020-01-13 DIAGNOSIS — R2689 Other abnormalities of gait and mobility: Secondary | ICD-10-CM | POA: Insufficient documentation

## 2020-01-13 DIAGNOSIS — I4891 Unspecified atrial fibrillation: Secondary | ICD-10-CM | POA: Diagnosis not present

## 2020-01-13 DIAGNOSIS — E1122 Type 2 diabetes mellitus with diabetic chronic kidney disease: Secondary | ICD-10-CM | POA: Diagnosis not present

## 2020-01-13 DIAGNOSIS — I2510 Atherosclerotic heart disease of native coronary artery without angina pectoris: Secondary | ICD-10-CM | POA: Diagnosis not present

## 2020-01-13 DIAGNOSIS — Z951 Presence of aortocoronary bypass graft: Secondary | ICD-10-CM | POA: Insufficient documentation

## 2020-01-13 DIAGNOSIS — Z87891 Personal history of nicotine dependence: Secondary | ICD-10-CM | POA: Diagnosis not present

## 2020-01-13 DIAGNOSIS — J9811 Atelectasis: Secondary | ICD-10-CM | POA: Diagnosis not present

## 2020-01-13 DIAGNOSIS — I129 Hypertensive chronic kidney disease with stage 1 through stage 4 chronic kidney disease, or unspecified chronic kidney disease: Secondary | ICD-10-CM | POA: Diagnosis not present

## 2020-01-13 DIAGNOSIS — D649 Anemia, unspecified: Secondary | ICD-10-CM | POA: Insufficient documentation

## 2020-01-13 DIAGNOSIS — K219 Gastro-esophageal reflux disease without esophagitis: Secondary | ICD-10-CM | POA: Insufficient documentation

## 2020-01-13 DIAGNOSIS — R0600 Dyspnea, unspecified: Secondary | ICD-10-CM | POA: Insufficient documentation

## 2020-01-13 HISTORY — DX: Personal history of urinary calculi: Z87.442

## 2020-01-13 HISTORY — DX: Noninfective gastroenteritis and colitis, unspecified: K52.9

## 2020-01-13 HISTORY — DX: Unspecified osteoarthritis, unspecified site: M19.90

## 2020-01-13 LAB — BLOOD GAS, ARTERIAL
Acid-base deficit: 3.7 mmol/L — ABNORMAL HIGH (ref 0.0–2.0)
Bicarbonate: 19.9 mmol/L — ABNORMAL LOW (ref 20.0–28.0)
Drawn by: 421801
FIO2: 21
O2 Saturation: 96.2 %
Patient temperature: 37
pCO2 arterial: 30.4 mmHg — ABNORMAL LOW (ref 32.0–48.0)
pH, Arterial: 7.431 (ref 7.350–7.450)
pO2, Arterial: 78.4 mmHg — ABNORMAL LOW (ref 83.0–108.0)

## 2020-01-13 LAB — COMPREHENSIVE METABOLIC PANEL
ALT: 16 U/L (ref 0–44)
AST: 20 U/L (ref 15–41)
Albumin: 3 g/dL — ABNORMAL LOW (ref 3.5–5.0)
Alkaline Phosphatase: 111 U/L (ref 38–126)
Anion gap: 11 (ref 5–15)
BUN: 25 mg/dL — ABNORMAL HIGH (ref 8–23)
CO2: 19 mmol/L — ABNORMAL LOW (ref 22–32)
Calcium: 9.2 mg/dL (ref 8.9–10.3)
Chloride: 108 mmol/L (ref 98–111)
Creatinine, Ser: 1.11 mg/dL (ref 0.61–1.24)
GFR calc Af Amer: >60 mL/min (ref >60)
GFR calc non Af Amer: >60 mL/min (ref >60)
Glucose, Bld: 169 mg/dL — ABNORMAL HIGH (ref 70–99)
Potassium: 4.8 mmol/L (ref 3.5–5.1)
Sodium: 138 mmol/L (ref 135–145)
Total Bilirubin: 1.2 mg/dL (ref 0.3–1.2)
Total Protein: 6.9 g/dL (ref 6.5–8.1)

## 2020-01-13 LAB — PROTIME-INR
INR: 1.1 (ref 0.8–1.2)
Prothrombin Time: 14 seconds (ref 11.4–15.2)

## 2020-01-13 LAB — URINALYSIS, ROUTINE W REFLEX MICROSCOPIC
Bilirubin Urine: NEGATIVE
Glucose, UA: NEGATIVE mg/dL
Hgb urine dipstick: NEGATIVE
Ketones, ur: 5 mg/dL — AB
Leukocytes,Ua: NEGATIVE
Nitrite: NEGATIVE
Protein, ur: NEGATIVE mg/dL
Specific Gravity, Urine: 1.015 (ref 1.005–1.030)
pH: 5 (ref 5.0–8.0)

## 2020-01-13 LAB — HEMOGLOBIN A1C
Hgb A1c MFr Bld: 7 % — ABNORMAL HIGH (ref 4.8–5.6)
Mean Plasma Glucose: 154.2 mg/dL

## 2020-01-13 LAB — GLUCOSE, CAPILLARY: Glucose-Capillary: 157 mg/dL — ABNORMAL HIGH (ref 70–99)

## 2020-01-13 LAB — CBC
HCT: 31.4 % — ABNORMAL LOW (ref 39.0–52.0)
Hemoglobin: 10 g/dL — ABNORMAL LOW (ref 13.0–17.0)
MCH: 32.5 pg (ref 26.0–34.0)
MCHC: 31.8 g/dL (ref 30.0–36.0)
MCV: 101.9 fL — ABNORMAL HIGH (ref 80.0–100.0)
Platelets: 156 10*3/uL (ref 150–400)
RBC: 3.08 MIL/uL — ABNORMAL LOW (ref 4.22–5.81)
RDW: 14.6 % (ref 11.5–15.5)
WBC: 7.6 10*3/uL (ref 4.0–10.5)
nRBC: 0 % (ref 0.0–0.2)

## 2020-01-13 LAB — ABO/RH: ABO/RH(D): A POS

## 2020-01-13 LAB — SURGICAL PCR SCREEN
MRSA, PCR: NEGATIVE
Staphylococcus aureus: NEGATIVE

## 2020-01-13 LAB — BRAIN NATRIURETIC PEPTIDE: B Natriuretic Peptide: 416.5 pg/mL — ABNORMAL HIGH (ref 0.0–100.0)

## 2020-01-13 LAB — APTT: aPTT: 35 seconds (ref 24–36)

## 2020-01-13 NOTE — Progress Notes (Signed)
Sent an IB message to Julieta Gutting, RN about pt's elevated BNP.

## 2020-01-13 NOTE — Progress Notes (Signed)
PCP - Dr. Sunnie Nielsen Cardiologist - Dr. Jens Som  Chest x-ray - today EKG - 12/29/19 Stress Test - denies ECHO - 12/26/19 Cardiac Cath - 11/26/19  Fasting Blood Sugar - around 118 Checks Blood Sugar __1___ times a day  Aspirin Instructions: to continue, not to take day of surgery  COVID TEST- scheduled for 01/15/20   Anesthesia review: yes, heart  Patient denies shortness of breath, fever, cough and chest pain at PAT appointment   All instructions explained to the patient, with a verbal understanding of the material. Patient agrees to go over the instructions while at home for a better understanding. Patient also instructed to self quarantine after being tested for COVID-19. The opportunity to ask questions was provided.

## 2020-01-13 NOTE — Progress Notes (Signed)
Called and left message for Julieta Gutting, RN about pt's elevated BNP.

## 2020-01-13 NOTE — Pre-Procedure Instructions (Signed)
Dakari Cregger Wolman  01/13/2020    Your procedure is scheduled on Tuesday, Jan 18, 2020 at 10:45 AM.   Report to [facility] Entrance "A" Admitting Office at 8:45 AM.   Call this number if you have problems the morning of surgery: [phone]   Questions prior to day of surgery, please call [phone] between 8 & 4 PM.   Remember:  Do not eat or drink after midnight Monday, 01/17/20.  Take these medicines the morning of surgery with A SIP OF WATER: Pantoprazole (Protonix)  Stop Metformin as instructed, last dose will be 01/15/20.  Stop Multivitamins as of today prior to surgery. Do not use NSAIDS (Ibuprofen, Aleve, etc), other Aspirin products (Goody's, BC Powders, etc), Fish Oil or Herbal medications prior to surgery.   Do not take evening dose of Glipizide on 01/17/20.   How to Manage Your Diabetes Before Surgery   Why is it important to control my blood sugar before and after surgery?   Improving blood sugar levels before and after surgery helps healing and can limit problems.  A way of improving blood sugar control is eating a healthy diet by:  - Eating less sugar and carbohydrates  - Increasing activity/exercise  - Talk with your doctor about reaching your blood sugar goals  High blood sugars (greater than 180 mg/dL) can raise your risk of infections and slow down your recovery so you will need to focus on controlling your diabetes during the weeks before surgery.  Make sure that the doctor who takes care of your diabetes knows about your planned surgery including the date and location.  How do I manage my blood sugars before surgery?   Check your blood sugar at least 4 times a day, 2 days before surgery to make sure that they are not too high or low.  Check your blood sugar the morning of your surgery when you wake up and every 2 hours until you get to the Short-Stay unit.  Treat a low blood sugar (less than 70 mg/dL) with 1/2 cup of clear juice (cranberry or  apple), 4 glucose tablets, OR glucose gel.  Recheck blood sugar in 15 minutes after treatment (to make sure it is greater than 70 mg/dL).  If blood sugar is not greater than 70 mg/dL on re-check, call [phone] for further instructions.   Report your blood sugar to the Short-Stay nurse when you get to Short-Stay.  References:  [facility], 2007 "How to Manage your Diabetes Before and After Surgery".    Do not wear jewelry.  Do not wear lotions, powders, colgone or deodorant.  Men may shave face and neck.  Do not bring valuables to the hospital.  [facility] is not responsible for any belongings or valuables.  Contacts, dentures or bridgework may not be worn into surgery.  Leave your suitcase in the car.  After surgery it may be brought to your room.  For patients admitted to the hospital, discharge time will be determined by your treatment team.  [facility] - Preparing for Surgery  Before surgery, you can play an important role.  Because skin is not sterile, your skin needs to be as free of germs as possible.  You can reduce the number of germs on you skin by washing with CHG (chlorahexidine gluconate) soap before surgery.  CHG is an antiseptic cleaner which kills germs and bonds with the skin to continue killing germs even after washing.  Oral Hygiene is also important  in reducing the risk of infection.  Remember to brush your teeth with your regular toothpaste the morning of surgery.  Please DO NOT use if you have an allergy to CHG or antibacterial soaps.  If your skin becomes reddened/irritated stop using the CHG and inform your nurse when you arrive at Short Stay.  Do not shave (including legs and underarms) for at least 48 hours prior to the first CHG shower.  You may shave your face.  Please follow these instructions carefully:   1.  Shower with CHG Soap the night before surgery and the morning of Surgery.  2.  If you choose to wash your hair, wash  your hair first as usual with your normal shampoo.  3.  After you shampoo, rinse your hair and body thoroughly to remove the shampoo. 4.  Use CHG as you would any other liquid soap.  You can apply chg directly to the skin and wash gently with a      scrungie or washcloth.           5.  Apply the CHG Soap to your body ONLY FROM THE NECK DOWN.   Do not use on open wounds or open sores. Avoid contact with your eyes, ears, mouth and genitals (private parts).  Wash genitals (private parts) with your normal soap - do this prior to using CHG soap.  6.  Wash thoroughly, paying special attention to the area where your surgery will be performed.  7.  Thoroughly rinse your body with warm water from the neck down.  8.  DO NOT shower/wash with your normal soap after using and rinsing off the CHG Soap.  9.  Pat yourself dry with a clean towel.            10.  Wear clean pajamas.            11.  Place clean sheets on your bed the night of your first shower and do not sleep with pets.  Day of Surgery  Shower as above. Do not apply any lotions/deodorants the morning of surgery.   Please wear clean clothes to the hospital. Remember to brush your teeth with toothpaste.   Please read over the fact sheets that you were given.

## 2020-01-13 NOTE — Therapy (Signed)
[facility] [address] [place], [place], [address] Phone: [phone]   Fax:  [phone]  Physical Therapy Evaluation  Patient Details  Name: Garrett Hughes MRN: [id number] Date of Birth: 1941-05-20 Referring Provider (PT): Cline Crock, PA-C   Encounter Date: 01/13/2020  PT End of Session - 01/13/20 1021    Visit Number  1    Number of Visits  1    Date for PT Re-Evaluation  --   NA-1 time visit only   PT Start Time  0925    PT Stop Time  1000    PT Time Calculation (min)  35 min    Equipment Utilized During Treatment  Gait belt    Activity Tolerance  Patient limited by fatigue    Behavior During Therapy  [facility] for tasks assessed/performed       Past Medical History:  Diagnosis Date  . Anemia   . Arthritis   . Atherosclerosis of native coronary artery of native heart without angina pectoris 01/05/2016  . Benign essential hypertension 01/05/2016  . Carotid artery disease (HCC)   . Cataract, nuclear, right 05/06/2014  . Chronic atrial fibrillation (HCC) 12/20/2015  . Chronic diarrhea   . Current use of long term anticoagulation 12/20/2015  . Diabetic kidney disease (HCC) 01/05/2016  . Dyspnea    uses 2 L of O2 at bedtime as needed  . GERD (gastroesophageal reflux disease)   . GI bleed   . History of Barrett's esophagus 12/20/2015  . History of blood in urine   . History of kidney stones   . 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 Hyperlipemia 10/26/2015  . Low back pain 05/02/2016  . Severe aortic stenosis   . Thrombocytopenia (HCC)   . Type 2 diabetes mellitus with kidney complication, without long-term current use of insulin (HCC) 07/03/2016    Past Surgical History:  Procedure Laterality Date  . BACK SURGERY  2004  . CARDIAC SURGERY  1997  . CORONARY ARTERY BYPASS GRAFT  1997  . CORONARY ATHERECTOMY N/A 12/24/2019   Procedure: CORONARY ATHERECTOMY;  Surgeon: [name], MD;  Location: [facility] INVASIVE CV LAB;  Service: Cardiovascular;  Laterality: N/A;  . RIGHT HEART CATH AND CORONARY/GRAFT ANGIOGRAPHY N/A 11/26/2019   Procedure: RIGHT HEART CATH AND CORONARY/GRAFT ANGIOGRAPHY;  Surgeon: [name], MD;  Location: [facility] INVASIVE CV LAB;  Service: Cardiovascular;  Laterality: N/A;  . SHOULDER SURGERY  2005    There were no vitals filed for this visit.   Subjective Assessment - 01/13/20 0934    Subjective  Pt. is a 79 y/o male referred to PT for pre-TAVR evaluation. He reports an approximately 1 year history of issues with shortness of breath which has been worsening in particular the past several weeks and limiting his activity tolerance.    Pertinent History  CABG 1997, CAD, s/p stents, lumbar surgery, bilateral rotator cuff surgery, diabetic    Limitations  Standing;Walking;House hold activities    Patient Stated Goals  Get heart better    Currently in Pain?  No/denies    Pain Score  0-No pain         [facility] PT Assessment - 01/13/20 0001      Assessment   Medical Diagnosis  Severe aortic stenosis    Referring Provider (PT)  Cline Crock, PA-C    Onset Date/Surgical Date  01/13/19    Hand Dominance  Right  Precautions   Precautions  None      Restrictions   Weight Bearing Restrictions  No      Balance Screen   Has the patient fallen in the past 6 months  Yes    How many times?  1      Home Environment   Living Environment  Private residence    Living Arrangements  Spouse/significant other    Type of Home  House    Home Access  Stairs to enter    Entrance Stairs-Number of Steps  5-6    Entrance Stairs-Rails  Left    Home Layout  Two level   pt. reports does not go upstairs   Home Equipment  None      Prior Function   Level of Independence  Independent with basic ADLs      Cognition   Overall Cognitive Status  Within Functional Limits for tasks assessed      Posture/Postural Control   Posture/Postural Control   Postural limitations    Postural Limitations  Forward head      ROM / Strength   AROM / PROM / Strength  AROM;Strength      AROM   Overall AROM Comments  Bilat. UE/LE AROM grossly [facility]      Strength   Overall Strength Comments  Right grip 26 lbs., left grip 34 lbs. bilateral shoulder flexion, abduction and ER all 4/5 otherwise bilat. UE/LE MMTs grossly 5/5      Ambulation/Gait   Gait Comments  Pt. ambulates independently without AD with decreased cadence with decreased bilateral arm swing       [facility] Pre-Surgical Assessment - 01/13/20 0001    5 Meter Walk Test- trial 1  9 sec    5 Meter Walk Test- trial 2  9 sec.     5 Meter Walk Test- trial 3  10 sec.    5 meter walk test average  9.33 sec    4 Stage Balance Test tolerated for:   10 sec.    4 Stage Balance Test Position  4    comment  able to complete all 4 positions x 10 sec    Sit To Stand Test- trial 1  20 sec.    ADL/IADL Independent with:  Bathing;Dressing;Meal prep;Finances    ADL/IADL Needs Assistance with:  Yard work    6 Minute Walk- Baseline  yes    BP (mmHg)  168/87    HR (bpm)  85    02 Sat (%RA)  97 %    Modified Borg Scale for Dyspnea  2- Mild shortness of breath    Perceived Rate of Exertion (Borg)  10-    6 Minute Walk Post Test  yes    BP (mmHg)  136/87    HR (bpm)  94    02 Sat (%RA)  86 %   increased to 97% with seated rest    Modified Borg Scale for Dyspnea  8-    Perceived Rate of Exertion (Borg)  17- Very hard    Aerobic Endurance Distance Walked  424    Endurance additional comments  2 standing rests required-see assessment for details              Objective measurements completed on examination: See above findings.              PT Education - 01/13/20 1020    Education Details  POC    Person(s) Educated  Patient  Methods  Explanation    Comprehension  Verbalized understanding                  Plan - 01/13/20 1021    Clinical Impression Statement  See  assessment in note    Personal Factors and Comorbidities  Age;Comorbidity 3+    Comorbidities  diabetic, cardiac history including history of CABG, stents, a-fib, lumbar surgery    Examination-Activity Limitations  Squat;Locomotion Level;Stairs;Stand;Carry;Lift    Examination-Participation Restrictions  Community Activity;Shop;Cleaning;Yard Work    [employer]chant [employer]  Stable/Uncomplicated    Designer, jewellery  Low    Rehab Potential  Good    PT Frequency  One time visit    PT Duration  --   NA   PT Treatment/Interventions  --   pre-TAVR assessment only   PT Next Visit Plan  NA    PT Home Exercise Plan  NA    Consulted and Agree with Plan of Care  Patient       Patient demonstrated the following deficits and impairments:  Decreased strength, Decreased activity tolerance, Cardiopulmonary status limiting activity, Decreased endurance, Difficulty walking  Visit Diagnosis: Other abnormalities of gait and mobility     Clinical Impression Statement: Pt is a 79 yo male presenting to OP PT for evaluation prior to possible TAVR surgery due to severe aortic stenosis. Pt reports onset of shortness of breath approximately 12 months ago which has been worsening in particular the past several weeks Symptoms are limiting his walking and activity tolerance. Pt presents with decreased shoulder strength bilaterally otherwise UE/LE AROM and strength grossly WFL. He is at low fall risk 4 per 4-stage balance test. He demonstrated decreased walking speed and limited aerobic endurance per 6 minute walk test. Pt ambulated 175 feet in 1 min and 44 seconds before requesting a standing rest beak lasting 20 seconds. At time of rest, patient's HR was 94 bpm and O2 was 86% on room air which increased to 97% with rest. Pt reported 8/10 shortness of breath on modified scale for dyspnea. Pt able to resume after rest and ambulate an additional 90 feet before requiring an additional standing rest break  with HR 92 bpm and O2 again decreased to 86% which recovered to >90% with rest for 30 seconds. Pt. Resumed walking without further rests and ambulated a total of 424 feet in 6 minute walk. Shortness of breath and RPE increased significantly with 6 minute walk test. Based on the Short Physical Performance Battery, patient has a frailty rating of 7/12 with </= 5/12 considered frail. Per age-related norms he demonstrated a disability rating of 69.01%.       Problem List Patient Active Problem List   Diagnosis Date Noted  . Acute on chronic diastolic CHF (congestive heart failure) ([place]) 12/28/2019  . Severe aortic stenosis 12/28/2019  . Syncope 12/28/2019  . CAD (coronary artery disease) 12/25/2019  . Hypocalcemia 03/26/2019  . Hyperlipidemia   . GERD (gastroesophageal reflux disease)   . Diabetes mellitus without complication ([place])   . Atrial fibrillation ([place])   . Nonrheumatic aortic valve stenosis 01/01/2017  . 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 Type 2 diabetes mellitus with kidney complication, without long-term current use of insulin ([place]) 07/03/2016  . Low back pain 05/02/2016  . Coronary artery disease involving native coronary artery of native heart with angina pectoris ([place]) 01/05/2016  . Benign essential hypertension 01/05/2016  . Current use of long term anticoagulation 12/20/2015  . History of Barrett's esophagus 12/20/2015  .  Hyperlipemia 10/26/2015  . Cataract, nuclear, right 05/06/2014    Lazarus Gowda, PT, DPT 01/13/20 10:35 AM  [facility] [address] [place], [place], [address] Phone: [phone]   Fax:  [phone]  Name: Garrett Hughes MRN: [id number] Date of Birth: [birth year]

## 2020-01-14 ENCOUNTER — Other Ambulatory Visit (HOSPITAL_COMMUNITY): Payer: Medicare Other

## 2020-01-14 NOTE — Progress Notes (Signed)
Anesthesia Chart Review:  Case: [id number] Date/Time: 01/18/20 1030   Procedures:      TRANSCATHETER AORTIC VALVE REPLACEMENT, TRANSFEMORAL (N/A Chest)     POSSIBLE TRANSCATHETER AORTIC VALVE REPLACEMENT, LEFT SUBCLAVIAN (Left Chest)     TRANSESOPHAGEAL ECHOCARDIOGRAM (TEE) (N/A )     LOWER EXTREMITY ANGIOGRAM with intervention (Bilateral )   Anesthesia type: General   Pre-op diagnosis: Severe Aortic Stenosis   Location: [place] OR ROOM 16 / [place] OR   Surgeons: Sherren Mocha, MD     CT Surgeon: Darylene Price, MD  * Per TAVR team communication: Planning Right Tempo- anesthesia please no neck lines.    DISCUSSION: Patient is a 79 year old male scheduled for the above procedure.  History includes former smoker (quit 1989), severe aortic stenosis (with syncope 12/29/19), afib (off warfarin due to GI bleed), CAD (s/p CABG: LIMA-LAD, RIMA-RCA, SVG-OM 1997 [facility]; s/p DES x3 RCA complicated by coronary perforation requiring covered stent 12/24/19), carotid artery disease, HTN, DM2, CKD, dyspnea (nocturnal O2 2L PRN), GERD, anemia, thrombocytopenia, GI bleed, chronic diarrhea, low back pain.  He had 80-99% RICA stenosis and 06-23% LICA stenosis by 7/62/83 carotid Duplex. He is asymptomatic of his carotid stenosis. He has been evaluated by vascular surgeon Dr. Trula Slade on 12/20/19 with plans for CTA of the neck in the future (02/21/20). His aortic stenosis needed to be addressed prior to considering possible RICA intervention.   Patient in on ASA 81 mg and Plavix 75 mg daily (he confirmed with me that he is taking Plavix 75 mg each morning). Per surgical team instructions, he is to hold metformin 2 days before surgery, continue other medications up to the day of surgery, and take only Protonix with sips of water on the day of the procedure.   Preoperative COVID-19 test scheduled for 01/15/2020.  Anesthesia team to evaluate on the day of surgery.   VS: BP (!) 154/97   Pulse 76   Temp 36.9 C (Oral)   Resp 20    Wt 66.8 kg   SpO2 97%   BMI 25.27 kg/m    PROVIDERS: Emeterio Reeve, DO is PCP  Kirk Ruths, MD is cardiologist Trula Slade, V. Rock Nephew, MD is vascular surgeon   LABS: Labs reviewed: Acceptable for surgery. (all labs ordered are listed, but only abnormal results are displayed)  Labs Reviewed  GLUCOSE, CAPILLARY - Abnormal; Notable for the following components:      Result Value   Glucose-Capillary 157 (*)    All other components within normal limits  BLOOD GAS, ARTERIAL - Abnormal; Notable for the following components:   pCO2 arterial 30.4 (*)    pO2, Arterial 78.4 (*)    Bicarbonate 19.9 (*)    Acid-base deficit 3.7 (*)    Allens test (pass/fail) BRACHIAL ARTERY (*)    All other components within normal limits  BRAIN NATRIURETIC PEPTIDE - Abnormal; Notable for the following components:   B Natriuretic Peptide 416.5 (*)    All other components within normal limits  CBC - Abnormal; Notable for the following components:   RBC 3.08 (*)    Hemoglobin 10.0 (*)    HCT 31.4 (*)    MCV 101.9 (*)    All other components within normal limits  COMPREHENSIVE METABOLIC PANEL - Abnormal; Notable for the following components:   CO2 19 (*)    Glucose, Bld 169 (*)    BUN 25 (*)    Albumin 3.0 (*)    All other components within normal limits  HEMOGLOBIN A1C -  Abnormal; Notable for the following components:   Hgb A1c MFr Bld 7.0 (*)    All other components within normal limits  URINALYSIS, ROUTINE W REFLEX MICROSCOPIC - Abnormal; Notable for the following components:   Ketones, ur 5 (*)    All other components within normal limits  SURGICAL PCR SCREEN  APTT  PROTIME-INR  TYPE AND SCREEN  ABO/RH     IMAGES: CXR 01/13/20:  FINDINGS: - There is cardiomegaly with mild pulmonary venous hypertension. There is interstitial edema with small pleural effusions bilaterally. There is bibasilar atelectasis. No consolidation. - Patient is status post coronary artery bypass grafting.  There is aortic atherosclerosis. There is stable anterior wedging of midthoracic and upper lumbar vertebral bodies. There is postoperative change in the left shoulder. There is superior migration of the right humeral head. IMPRESSION: - Appearance felt to be indicative of a degree of congestive heart failure. No airspace consolidation. There is bibasilar atelectasis. - Postoperative changes noted. Arthropathy in each shoulder with apparent chronic rotator cuff tear on the right. - Aortic Atherosclerosis (ICD10-I70.0).  CTA chest/abd/pelvis 12/08/19: IMPRESSION: 1. Vascular findings and measurements pertinent to potential TAVR procedure, as detailed above. 2. Severe thickening calcification of the aortic valve, compatible with reported clinical history of severe aortic stenosis. 3. Aortic atherosclerosis, in addition to left main and 3 vessel coronary artery disease. Status post median sternotomy for CABG including LIMA to the LAD. 4. Mild cardiomegaly with findings suggestive of mild congestive heart failure, as above. 5. The appearance of the liver suggests underlying cirrhosis. 6. Diffuse pancreatic atrophy. 7. Severe colonic diverticulosis without evidence of acute diverticulitis at this time. 8. Additional incidental findings, as above.    EKG: 12/28/19: Atrial fibrillation at 71 bpm Left axis deviation Right bundle branch block Abnormal ECG No significant change since last tracing Confirmed by Daneen Schick [phone]) on 12/29/2019 8:46:57 PM   CV: Long-term cardiac monitor 01/04/20: Ordered following of 2.3 second pause during hospitalization for syncope. In Process.   CT Coronary 12/08/19: IMPRESSION: 1. Tricuspid aortic valve. Severely reduced cusp separation. Severely thickened, severely calcified aortic valve cusps. 2.  AV calcium score 1593 3. Annulus area: 466 mm2. The annulus would be suitable for a 26 mm Sapien 3 valve. LVOT calcifications. 4. Adequate right  coronary height from the annulus. Left coronary artery height is adequate but is known to be occluded. 5.  Borderline mid ascending aorta dilation, 38 mm. 6. Optimum Fluoroscopic Angle for Delivery: RAO 0, CRA 1   Limited Echo (2 days post-atherectomy of RCA with perforation covered with stent) 12/26/19: IMPRESSIONS  1. Limited evaluation of pericardial effusion. Trivial effusion is  present that is not clinically significant.  2. Left ventricular ejection fraction, by estimation, is 60 to 65%. The  left ventricle has normal function. The left ventricle has no regional  wall motion abnormalities.  3. Right ventricular systolic function is normal. The right ventricular  size is mildly enlarged.  4. The inferior vena cava is dilated in size with <50% respiratory  variability, suggesting right atrial pressure of 15 mmHg.    Echo 10/19/19: IMPRESSIONS  1. Left ventricular ejection fraction, by estimation, is 55 to 60%. The  left ventricle has normal function. The left ventrical has no regional  wall motion abnormalities. There is mildly increased left ventricular  hypertrophy. Left ventricular diastolic  parameters are indeterminate.  2. Right ventricular systolic function is normal. The right ventricular  size is normal. There is moderately elevated pulmonary artery systolic  pressure.  3. Left atrial size was severely dilated.  4. The mitral valve is degenerative. Moderate mitral valve regurgitation.  5. Tricuspid valve regurgitation is moderate.  6. The aortic valve is bicuspid. Aortic valve regurgitation is mild to  moderate. Severe aortic valve stenosis. Aortic valve mean gradient measures 39.4  mmHg. Aortic valve peak gradient measures 59.4 mmHg. Aortic valve area, by  VTI measures 0.57 cm.  7. The inferior vena cava is normal in size with <50% respiratory  variability, suggesting right atrial pressure of 8 mmHg.    Cardiac cath 11/26/19: LM: Ost LM to Dist LM  lesion is 100% stenosed. The lesion is severely calcified. RCA: Prox RCA lesion is 40% stenosed. The lesion is calcified. Mid RCA lesion is 95% stenosed. The lesion is eccentric. The lesion is severely calcified. LIMA-LAD: LIMA and is normal in caliber. The graft exhibits no disease. SVG-OM1: SVG and is normal in caliber. The graft exhibits no disease. Widely patent SVG-OM, fills the entire LCx distribution. RIMA-Dist RCA: The graft is atretic. Atretic RIMA graft. 1.  Severe multivessel coronary artery disease with occlusion of the left coronary artery and severe calcific stenosis of the right coronary artery 2.  Status post aortocoronary bypass surgery with continued patency of the LIMA to LAD graft and saphenous vein graft OM, atretic RIMA to RCA 3.  Known severe aortic stenosis by noninvasive assessment PCI 12/24/19:  Successful orbital atherectomy and stenting of the RCA with 3 overlapping DES, complicated by small coronary perforation requiring covered stent placement (4.0x20 mm Papyrus) Recommendations Antiplatelet/Anticoag Recommend dual antiplatelet therapy with Aspirin 30m daily and Clopidogrel 720mdaily long-term (beyond 12 months) because of covered stent placement.    Carotid [place]/31/21: Summary:  Right Carotid: Velocities in the right ICA are consistent with a 80-99%         stenosis.  Left Carotid: Velocities in the left ICA are consistent with a 40-59%  stenosis.  Vertebrals: Bilateral vertebral arteries demonstrate antegrade flow.  - Per Dr. CrStanford Breed"He would not be a candidate for carotid endarterectomy until valve is replaced."   Past Medical History:  Diagnosis Date  . Anemia   . Arthritis   . Atherosclerosis of native coronary artery of native heart without angina pectoris 01/05/2016  . Benign essential hypertension 01/05/2016  . Carotid artery disease ([facility]  . Cataract, nuclear, right 05/06/2014  . Chronic atrial fibrillation ([facility]04/08/2016  . Chronic  diarrhea   . Current use of long term anticoagulation 12/20/2015  . Diabetic kidney disease (HCO[place]4/28/2017  . Dyspnea    uses 2 L of O2 at bedtime as needed  . GERD (gastroesophageal reflux disease)   . GI bleed   . History of Barrett's esophagus 12/20/2015  . History of blood in urine   . History of kidney stones   . 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 Hyperlipemia 10/26/2015  . Low back pain 05/02/2016  . Severe aortic stenosis   . Thrombocytopenia ([facility]  . Type 2 diabetes mellitus with kidney complication, without long-term current use of insulin (HCHutch[place]10/25/2017    Past Surgical History:  Procedure Laterality Date  . [place]  2004  [place] CORONARY ARTERY BY[place] GRAFT  1997  . CORONARY ATHERECTOMY N/A 12/24/2019   Procedure: CORONARY ATHERECTOMY;  Surgeon: CoSherren MochaMD;  [facility];  Service: Cardiovascular;  Laterality: N/A;  .  RIGHT HEART CATH AND CORONARY/GRAFT ANGIOGRAPHY N/A 11/26/2019   Procedure: RIGHT HEART CATH AND CORONARY/GRAFT ANGIOGRAPHY;  Surgeon: Sherren Mocha, MD;  Location: [place] [facility] LAB;  Service: Cardiovascular;  Laterality: N/A;  . SHOULDER SURGERY  2005    MEDICATIONS: . clopidogrel (PLAVIX) 75 MG tablet  . ACCU-CHEK SOFTCLIX LANCETS lancets  . Alcohol Swabs PADS  . AMBULATORY NON FORMULARY MEDICATION  . aspirin 81 MG chewable tablet  . Blood Glucose Calibration (ACCU-CHEK AVIVA) SOLN  . Blood Glucose Monitoring Suppl (ACCU-CHEK AVIVA PLUS) w/Device KIT  . doxazosin (CARDURA) 8 MG tablet  . ferrous sulfate 325 (65 FE) MG EC tablet  . furosemide (LASIX) 40 MG tablet  . glipiZIDE (GLUCOTROL) 5 MG tablet  . glucose blood (ACCU-CHEK AVIVA PLUS) test strip  . Lancets (ONETOUCH ULTRASOFT) lancets  . lisinopril (ZESTRIL) 5 MG tablet  . metFORMIN (GLUCOPHAGE) 1000 MG tablet  . metoprolol succinate (TOPROL-XL) 25 MG 24 hr tablet  . Multiple Vitamin  (MULTIVITAMIN WITH MINERALS) TABS tablet  . nitroGLYCERIN (NITROSTAT) 0.4 MG SL tablet  . pantoprazole (PROTONIX) 20 MG tablet  . pravastatin (PRAVACHOL) 40 MG tablet   No current facility-administered medications for this encounter.    Myra Gianotti, PA-C Surgical Short Stay/Anesthesiology [facility] Phone [phone] [facility] Phone [phone] 01/14/2020 2:30 PM

## 2020-01-14 NOTE — Anesthesia Preprocedure Evaluation (Addendum)
Anesthesia Evaluation  Patient identified by MRN, date of birth, ID band Patient awake    Reviewed: Allergy & Precautions, NPO status , Patient's Chart, lab work & pertinent test results  Airway Mallampati: II  TM Distance: >3 FB Neck ROM: Full    Dental  (+) Dental Advisory Given, Edentulous Upper   Pulmonary former smoker,    breath sounds clear to auscultation       Cardiovascular hypertension,  Rhythm:Regular Rate:Normal + Systolic murmurs    Neuro/Psych    GI/Hepatic   Endo/Other  diabetes  Renal/GU      Musculoskeletal   Abdominal   Peds  Hematology   Anesthesia Other Findings   Reproductive/Obstetrics                           Anesthesia Physical Anesthesia Plan  ASA: III  Anesthesia Plan: MAC   Post-op Pain Management:    Induction: Intravenous  PONV Risk Score and Plan: Ondansetron and Propofol infusion  Airway Management Planned: Natural Airway and Simple Face Mask  Additional Equipment:   Intra-op Plan:   Post-operative Plan:   Informed Consent: I have reviewed the patients History and Physical, chart, labs and discussed the procedure including the risks, benefits and alternatives for the proposed anesthesia with the patient or authorized representative who has indicated his/her understanding and acceptance.       Plan Discussed with: CRNA and Anesthesiologist  Anesthesia Plan Comments: (PAT note written 01/14/2020 by Myra Gianotti, PA-C. History includes severe AS, afib, CAB (CABG 1997, s/p DES x3 RCA complicated by coronary perforation requiring covered stent 12/24/19), DM2, CKD, carotid artery stenosis (80-99% RICA stenosis and 50-09% LICA stenosis by 3/81/82, plans to address further following TAVR)    * Per TAVR team communication: Planning Right Tempo- anesthesia please no neck lines.      )       Anesthesia Quick Evaluation

## 2020-01-15 ENCOUNTER — Other Ambulatory Visit (HOSPITAL_COMMUNITY)
Admission: RE | Admit: 2020-01-15 | Discharge: 2020-01-15 | Disposition: A | Discharge status: Home / Self Care | Payer: Medicare Other | Source: Ambulatory Visit | Attending: Cardiovascular Disease | Admitting: Cardiovascular Disease

## 2020-01-15 DIAGNOSIS — I350 Nonrheumatic aortic (valve) stenosis: Secondary | ICD-10-CM | POA: Diagnosis not present

## 2020-01-15 DIAGNOSIS — Z20822 Contact with and (suspected) exposure to covid-19: Secondary | ICD-10-CM | POA: Insufficient documentation

## 2020-01-15 DIAGNOSIS — K2270 Barrett's esophagus without dysplasia: Secondary | ICD-10-CM | POA: Diagnosis not present

## 2020-01-15 DIAGNOSIS — E860 Dehydration: Secondary | ICD-10-CM | POA: Diagnosis not present

## 2020-01-15 DIAGNOSIS — Z006 Encounter for examination for normal comparison and control in clinical research program: Secondary | ICD-10-CM | POA: Diagnosis not present

## 2020-01-15 DIAGNOSIS — I452 Bifascicular block: Secondary | ICD-10-CM | POA: Diagnosis not present

## 2020-01-15 DIAGNOSIS — I081 Rheumatic disorders of both mitral and tricuspid valves: Secondary | ICD-10-CM | POA: Diagnosis not present

## 2020-01-15 DIAGNOSIS — I6523 Occlusion and stenosis of bilateral carotid arteries: Secondary | ICD-10-CM | POA: Diagnosis not present

## 2020-01-15 DIAGNOSIS — I97638 Postprocedural hematoma of a circulatory system organ or structure following other circulatory system procedure: Secondary | ICD-10-CM | POA: Diagnosis not present

## 2020-01-15 DIAGNOSIS — I69354 Hemiplegia and hemiparesis following cerebral infarction affecting left non-dominant side: Secondary | ICD-10-CM | POA: Diagnosis not present

## 2020-01-15 DIAGNOSIS — E1129 Type 2 diabetes mellitus with other diabetic kidney complication: Secondary | ICD-10-CM | POA: Diagnosis not present

## 2020-01-15 DIAGNOSIS — R414 Neurologic neglect syndrome: Secondary | ICD-10-CM | POA: Diagnosis not present

## 2020-01-15 DIAGNOSIS — I2721 Secondary pulmonary arterial hypertension: Secondary | ICD-10-CM | POA: Diagnosis not present

## 2020-01-15 DIAGNOSIS — Z01812 Encounter for preprocedural laboratory examination: Secondary | ICD-10-CM | POA: Insufficient documentation

## 2020-01-15 DIAGNOSIS — I700 Atherosclerosis of aorta: Secondary | ICD-10-CM | POA: Diagnosis not present

## 2020-01-15 DIAGNOSIS — I25119 Atherosclerotic heart disease of native coronary artery with unspecified angina pectoris: Secondary | ICD-10-CM | POA: Diagnosis not present

## 2020-01-15 DIAGNOSIS — E1151 Type 2 diabetes mellitus with diabetic peripheral angiopathy without gangrene: Secondary | ICD-10-CM | POA: Diagnosis not present

## 2020-01-15 DIAGNOSIS — D62 Acute posthemorrhagic anemia: Secondary | ICD-10-CM | POA: Diagnosis not present

## 2020-01-15 DIAGNOSIS — I63443 Cerebral infarction due to embolism of bilateral cerebellar arteries: Secondary | ICD-10-CM | POA: Diagnosis not present

## 2020-01-15 DIAGNOSIS — E785 Hyperlipidemia, unspecified: Secondary | ICD-10-CM | POA: Diagnosis not present

## 2020-01-15 DIAGNOSIS — I5033 Acute on chronic diastolic (congestive) heart failure: Secondary | ICD-10-CM | POA: Diagnosis not present

## 2020-01-15 DIAGNOSIS — I4821 Permanent atrial fibrillation: Secondary | ICD-10-CM | POA: Diagnosis not present

## 2020-01-15 DIAGNOSIS — K219 Gastro-esophageal reflux disease without esophagitis: Secondary | ICD-10-CM | POA: Diagnosis not present

## 2020-01-15 DIAGNOSIS — I442 Atrioventricular block, complete: Secondary | ICD-10-CM | POA: Diagnosis not present

## 2020-01-15 LAB — SARS CORONAVIRUS 2 (TAT 6-24 HRS): SARS Coronavirus 2: NEGATIVE

## 2020-01-17 ENCOUNTER — Other Ambulatory Visit: Payer: Medicare Other

## 2020-01-17 ENCOUNTER — Ambulatory Visit: Payer: Medicare Other | Admitting: Surgery

## 2020-01-17 MED ORDER — SODIUM CHLORIDE 0.9 % IV SOLN
1.5000 g | INTRAVENOUS | Status: AC
  Administered 2020-01-18: 1.5 g via INTRAVENOUS
  Filled 2020-01-17: qty 1.5

## 2020-01-17 MED ORDER — DEXMEDETOMIDINE HCL IN NACL 400 MCG/100ML IV SOLN
0.1000 ug/kg/h | INTRAVENOUS | Status: AC
  Administered 2020-01-18: 66.8 ug via INTRAVENOUS
  Administered 2020-01-18: 11:00:00 1 ug/kg/h via INTRAVENOUS
  Filled 2020-01-17: qty 100

## 2020-01-17 MED ORDER — NOREPINEPHRINE 4 MG/250ML-% IV SOLN
0.0000 ug/min | INTRAVENOUS | Status: AC
  Administered 2020-01-18: 13:00:00 2 ug/min via INTRAVENOUS
  Filled 2020-01-17: qty 250

## 2020-01-17 MED ORDER — MAGNESIUM SULFATE 50 % IJ SOLN
40.0000 meq | INTRAMUSCULAR | Status: DC
  Filled 2020-01-17: qty 9.85

## 2020-01-17 MED ORDER — SODIUM CHLORIDE 0.9 % IV SOLN
INTRAVENOUS | Status: DC
  Filled 2020-01-17: qty 30

## 2020-01-17 MED ORDER — POTASSIUM CHLORIDE 2 MEQ/ML IV SOLN
80.0000 meq | INTRAVENOUS | Status: DC
  Filled 2020-01-17: qty 40

## 2020-01-17 MED ORDER — VANCOMYCIN HCL 1250 MG/250ML IV SOLN
1250.0000 mg | INTRAVENOUS | Status: AC
  Administered 2020-01-18: 11:00:00 1250 mg via INTRAVENOUS
  Filled 2020-01-17: qty 250

## 2020-01-17 NOTE — Progress Notes (Signed)
Pt's wife had called and requested that I call pt back about his blood sugar. Mr. Hitsman states that his blood sugar is running high today and he's taken his Glipizide this morning and isn't supposed to take it again before surgery and his Metformin was held since yesterday. He states his blood sugar was 232 this afternoon. I asked him if he had eaten prior to taking the blood sugar and he said yes he had eaten about an hour before he checked his blood sugar. I asked him what his blood sugar was before he ate and he said he thinks it was around 170. I told him that the 232 wasn't too unusual after eating, but to be careful of his carb/sugar intake for the rest of the day. He voiced understanding and thanked me for returning his call.

## 2020-01-18 ENCOUNTER — Inpatient Hospital Stay (HOSPITAL_COMMUNITY): Payer: Medicare Other | Admitting: Certified Registered"

## 2020-01-18 ENCOUNTER — Inpatient Hospital Stay (HOSPITAL_COMMUNITY)
Admission: RE | Admit: 2020-01-18 | Discharge: 2020-01-28 | DRG: 266 | Disposition: A | Discharge status: Skilled Nursing Facility | Payer: Medicare Other | Attending: Cardiovascular Disease | Admitting: Cardiovascular Disease

## 2020-01-18 ENCOUNTER — Other Ambulatory Visit: Payer: Self-pay | Admitting: Physician Assistant

## 2020-01-18 ENCOUNTER — Inpatient Hospital Stay (HOSPITAL_COMMUNITY): Payer: Medicare Other | Admitting: Vascular Surgery

## 2020-01-18 ENCOUNTER — Ambulatory Visit (HOSPITAL_COMMUNITY): Payer: Medicare Other

## 2020-01-18 ENCOUNTER — Encounter (HOSPITAL_COMMUNITY)
Admission: RE | Disposition: A | Discharge status: Skilled Nursing Facility | Payer: Medicare Other | Source: Home / Self Care | Attending: Cardiovascular Disease

## 2020-01-18 ENCOUNTER — Telehealth: Payer: Self-pay | Admitting: Physician Assistant

## 2020-01-18 ENCOUNTER — Other Ambulatory Visit: Payer: Self-pay

## 2020-01-18 ENCOUNTER — Inpatient Hospital Stay (HOSPITAL_COMMUNITY): Payer: Medicare Other

## 2020-01-18 ENCOUNTER — Encounter (HOSPITAL_COMMUNITY): Payer: Self-pay | Admitting: Cardiovascular Disease

## 2020-01-18 DIAGNOSIS — I6521 Occlusion and stenosis of right carotid artery: Secondary | ICD-10-CM | POA: Diagnosis not present

## 2020-01-18 DIAGNOSIS — I700 Atherosclerosis of aorta: Secondary | ICD-10-CM | POA: Diagnosis present

## 2020-01-18 DIAGNOSIS — K8689 Other specified diseases of pancreas: Secondary | ICD-10-CM | POA: Diagnosis present

## 2020-01-18 DIAGNOSIS — I6523 Occlusion and stenosis of bilateral carotid arteries: Secondary | ICD-10-CM | POA: Diagnosis present

## 2020-01-18 DIAGNOSIS — Z952 Presence of prosthetic heart valve: Secondary | ICD-10-CM | POA: Diagnosis not present

## 2020-01-18 DIAGNOSIS — W19XXXA Unspecified fall, initial encounter: Secondary | ICD-10-CM | POA: Diagnosis not present

## 2020-01-18 DIAGNOSIS — I482 Chronic atrial fibrillation, unspecified: Secondary | ICD-10-CM | POA: Diagnosis not present

## 2020-01-18 DIAGNOSIS — K2270 Barrett's esophagus without dysplasia: Secondary | ICD-10-CM | POA: Diagnosis present

## 2020-01-18 DIAGNOSIS — Z006 Encounter for examination for normal comparison and control in clinical research program: Secondary | ICD-10-CM

## 2020-01-18 DIAGNOSIS — R0902 Hypoxemia: Secondary | ICD-10-CM | POA: Diagnosis not present

## 2020-01-18 DIAGNOSIS — Z888 Allergy status to other drugs, medicaments and biological substances status: Secondary | ICD-10-CM

## 2020-01-18 DIAGNOSIS — M47812 Spondylosis without myelopathy or radiculopathy, cervical region: Secondary | ICD-10-CM | POA: Diagnosis not present

## 2020-01-18 DIAGNOSIS — E1151 Type 2 diabetes mellitus with diabetic peripheral angiopathy without gangrene: Secondary | ICD-10-CM | POA: Diagnosis present

## 2020-01-18 DIAGNOSIS — I081 Rheumatic disorders of both mitral and tricuspid valves: Secondary | ICD-10-CM | POA: Diagnosis present

## 2020-01-18 DIAGNOSIS — I959 Hypotension, unspecified: Secondary | ICD-10-CM | POA: Diagnosis not present

## 2020-01-18 DIAGNOSIS — I779 Disorder of arteries and arterioles, unspecified: Secondary | ICD-10-CM | POA: Diagnosis present

## 2020-01-18 DIAGNOSIS — S0101XA Laceration without foreign body of scalp, initial encounter: Secondary | ICD-10-CM | POA: Diagnosis not present

## 2020-01-18 DIAGNOSIS — E119 Type 2 diabetes mellitus without complications: Secondary | ICD-10-CM

## 2020-01-18 DIAGNOSIS — E1122 Type 2 diabetes mellitus with diabetic chronic kidney disease: Secondary | ICD-10-CM | POA: Diagnosis not present

## 2020-01-18 DIAGNOSIS — M545 Low back pain, unspecified: Secondary | ICD-10-CM | POA: Diagnosis present

## 2020-01-18 DIAGNOSIS — D62 Acute posthemorrhagic anemia: Secondary | ICD-10-CM | POA: Diagnosis not present

## 2020-01-18 DIAGNOSIS — Z7982 Long term (current) use of aspirin: Secondary | ICD-10-CM

## 2020-01-18 DIAGNOSIS — I25119 Atherosclerotic heart disease of native coronary artery with unspecified angina pectoris: Secondary | ICD-10-CM | POA: Diagnosis present

## 2020-01-18 DIAGNOSIS — R4182 Altered mental status, unspecified: Secondary | ICD-10-CM | POA: Diagnosis not present

## 2020-01-18 DIAGNOSIS — E860 Dehydration: Secondary | ICD-10-CM | POA: Diagnosis present

## 2020-01-18 DIAGNOSIS — Z8249 Family history of ischemic heart disease and other diseases of the circulatory system: Secondary | ICD-10-CM

## 2020-01-18 DIAGNOSIS — I452 Bifascicular block: Secondary | ICD-10-CM | POA: Diagnosis present

## 2020-01-18 DIAGNOSIS — E785 Hyperlipidemia, unspecified: Secondary | ICD-10-CM | POA: Diagnosis present

## 2020-01-18 DIAGNOSIS — I6340 Cerebral infarction due to embolism of unspecified cerebral artery: Secondary | ICD-10-CM

## 2020-01-18 DIAGNOSIS — I63443 Cerebral infarction due to embolism of bilateral cerebellar arteries: Secondary | ICD-10-CM | POA: Diagnosis not present

## 2020-01-18 DIAGNOSIS — I442 Atrioventricular block, complete: Secondary | ICD-10-CM | POA: Diagnosis present

## 2020-01-18 DIAGNOSIS — I4821 Permanent atrial fibrillation: Secondary | ICD-10-CM | POA: Diagnosis present

## 2020-01-18 DIAGNOSIS — I25110 Atherosclerotic heart disease of native coronary artery with unstable angina pectoris: Secondary | ICD-10-CM | POA: Diagnosis not present

## 2020-01-18 DIAGNOSIS — I5033 Acute on chronic diastolic (congestive) heart failure: Secondary | ICD-10-CM | POA: Diagnosis present

## 2020-01-18 DIAGNOSIS — R414 Neurologic neglect syndrome: Secondary | ICD-10-CM | POA: Diagnosis not present

## 2020-01-18 DIAGNOSIS — I97638 Postprocedural hematoma of a circulatory system organ or structure following other circulatory system procedure: Secondary | ICD-10-CM | POA: Diagnosis not present

## 2020-01-18 DIAGNOSIS — I2721 Secondary pulmonary arterial hypertension: Secondary | ICD-10-CM | POA: Diagnosis present

## 2020-01-18 DIAGNOSIS — I509 Heart failure, unspecified: Secondary | ICD-10-CM

## 2020-01-18 DIAGNOSIS — Z951 Presence of aortocoronary bypass graft: Secondary | ICD-10-CM | POA: Diagnosis not present

## 2020-01-18 DIAGNOSIS — Z950 Presence of cardiac pacemaker: Secondary | ICD-10-CM | POA: Diagnosis not present

## 2020-01-18 DIAGNOSIS — I110 Hypertensive heart disease with heart failure: Secondary | ICD-10-CM | POA: Diagnosis present

## 2020-01-18 DIAGNOSIS — S0003XA Contusion of scalp, initial encounter: Secondary | ICD-10-CM | POA: Diagnosis not present

## 2020-01-18 DIAGNOSIS — Z79899 Other long term (current) drug therapy: Secondary | ICD-10-CM

## 2020-01-18 DIAGNOSIS — M4854XA Collapsed vertebra, not elsewhere classified, thoracic region, initial encounter for fracture: Secondary | ICD-10-CM | POA: Diagnosis not present

## 2020-01-18 DIAGNOSIS — J811 Chronic pulmonary edema: Secondary | ICD-10-CM | POA: Diagnosis not present

## 2020-01-18 DIAGNOSIS — Z87442 Personal history of urinary calculi: Secondary | ICD-10-CM

## 2020-01-18 DIAGNOSIS — Z954 Presence of other heart-valve replacement: Secondary | ICD-10-CM | POA: Diagnosis not present

## 2020-01-18 DIAGNOSIS — I639 Cerebral infarction, unspecified: Secondary | ICD-10-CM | POA: Diagnosis not present

## 2020-01-18 DIAGNOSIS — Z20822 Contact with and (suspected) exposure to covid-19: Secondary | ICD-10-CM | POA: Diagnosis present

## 2020-01-18 DIAGNOSIS — Z959 Presence of cardiac and vascular implant and graft, unspecified: Secondary | ICD-10-CM

## 2020-01-18 DIAGNOSIS — R58 Hemorrhage, not elsewhere classified: Secondary | ICD-10-CM | POA: Diagnosis not present

## 2020-01-18 DIAGNOSIS — I499 Cardiac arrhythmia, unspecified: Secondary | ICD-10-CM | POA: Diagnosis not present

## 2020-01-18 DIAGNOSIS — E1165 Type 2 diabetes mellitus with hyperglycemia: Secondary | ICD-10-CM | POA: Diagnosis not present

## 2020-01-18 DIAGNOSIS — K449 Diaphragmatic hernia without obstruction or gangrene: Secondary | ICD-10-CM | POA: Diagnosis present

## 2020-01-18 DIAGNOSIS — J9811 Atelectasis: Secondary | ICD-10-CM | POA: Diagnosis not present

## 2020-01-18 DIAGNOSIS — J449 Chronic obstructive pulmonary disease, unspecified: Secondary | ICD-10-CM | POA: Diagnosis not present

## 2020-01-18 DIAGNOSIS — I4819 Other persistent atrial fibrillation: Secondary | ICD-10-CM | POA: Diagnosis not present

## 2020-01-18 DIAGNOSIS — R0602 Shortness of breath: Secondary | ICD-10-CM | POA: Diagnosis not present

## 2020-01-18 DIAGNOSIS — Z794 Long term (current) use of insulin: Secondary | ICD-10-CM

## 2020-01-18 DIAGNOSIS — Z743 Need for continuous supervision: Secondary | ICD-10-CM | POA: Diagnosis not present

## 2020-01-18 DIAGNOSIS — Z8719 Personal history of other diseases of the digestive system: Secondary | ICD-10-CM

## 2020-01-18 DIAGNOSIS — I63233 Cerebral infarction due to unspecified occlusion or stenosis of bilateral carotid arteries: Secondary | ICD-10-CM | POA: Diagnosis not present

## 2020-01-18 DIAGNOSIS — I10 Essential (primary) hypertension: Secondary | ICD-10-CM | POA: Diagnosis not present

## 2020-01-18 DIAGNOSIS — I350 Nonrheumatic aortic (valve) stenosis: Principal | ICD-10-CM | POA: Diagnosis present

## 2020-01-18 DIAGNOSIS — M2550 Pain in unspecified joint: Secondary | ICD-10-CM | POA: Diagnosis not present

## 2020-01-18 DIAGNOSIS — M4802 Spinal stenosis, cervical region: Secondary | ICD-10-CM | POA: Diagnosis not present

## 2020-01-18 DIAGNOSIS — Z87891 Personal history of nicotine dependence: Secondary | ICD-10-CM

## 2020-01-18 DIAGNOSIS — K219 Gastro-esophageal reflux disease without esophagitis: Secondary | ICD-10-CM | POA: Diagnosis present

## 2020-01-18 DIAGNOSIS — Z8673 Personal history of transient ischemic attack (TIA), and cerebral infarction without residual deficits: Secondary | ICD-10-CM | POA: Diagnosis not present

## 2020-01-18 DIAGNOSIS — R410 Disorientation, unspecified: Secondary | ICD-10-CM | POA: Diagnosis not present

## 2020-01-18 DIAGNOSIS — S0181XA Laceration without foreign body of other part of head, initial encounter: Secondary | ICD-10-CM | POA: Diagnosis not present

## 2020-01-18 DIAGNOSIS — R001 Bradycardia, unspecified: Secondary | ICD-10-CM | POA: Diagnosis not present

## 2020-01-18 DIAGNOSIS — I2510 Atherosclerotic heart disease of native coronary artery without angina pectoris: Secondary | ICD-10-CM | POA: Diagnosis present

## 2020-01-18 DIAGNOSIS — E782 Mixed hyperlipidemia: Secondary | ICD-10-CM | POA: Diagnosis not present

## 2020-01-18 DIAGNOSIS — I69354 Hemiplegia and hemiparesis following cerebral infarction affecting left non-dominant side: Secondary | ICD-10-CM | POA: Diagnosis not present

## 2020-01-18 DIAGNOSIS — Z9861 Coronary angioplasty status: Secondary | ICD-10-CM

## 2020-01-18 DIAGNOSIS — Z7902 Long term (current) use of antithrombotics/antiplatelets: Secondary | ICD-10-CM

## 2020-01-18 DIAGNOSIS — E1129 Type 2 diabetes mellitus with other diabetic kidney complication: Secondary | ICD-10-CM | POA: Diagnosis present

## 2020-01-18 DIAGNOSIS — G459 Transient cerebral ischemic attack, unspecified: Secondary | ICD-10-CM | POA: Diagnosis not present

## 2020-01-18 DIAGNOSIS — Z7401 Bed confinement status: Secondary | ICD-10-CM | POA: Diagnosis not present

## 2020-01-18 DIAGNOSIS — W1839XA Other fall on same level, initial encounter: Secondary | ICD-10-CM | POA: Diagnosis not present

## 2020-01-18 DIAGNOSIS — I6522 Occlusion and stenosis of left carotid artery: Secondary | ICD-10-CM | POA: Diagnosis not present

## 2020-01-18 HISTORY — DX: Presence of prosthetic heart valve: Z95.2

## 2020-01-18 HISTORY — DX: Chronic obstructive pulmonary disease, unspecified: J44.9

## 2020-01-18 HISTORY — PX: AORTOGRAM: SHX6300

## 2020-01-18 HISTORY — PX: INTRAOPERATIVE TRANSTHORACIC ECHOCARDIOGRAM: SHX6523

## 2020-01-18 HISTORY — PX: LOWER EXTREMITY ANGIOGRAM: SHX5508

## 2020-01-18 HISTORY — PX: TRANSCATHETER AORTIC VALVE REPLACEMENT, TRANSFEMORAL: SHX6400

## 2020-01-18 HISTORY — PX: INTRAOPERATIVE TRANSESOPHAGEAL ECHOCARDIOGRAM: SHX5062

## 2020-01-18 LAB — POCT I-STAT 7, (LYTES, BLD GAS, ICA,H+H)
Acid-Base Excess: 5 mmol/L — ABNORMAL HIGH (ref 0.0–2.0)
Acid-Base Excess: 2 mmol/L (ref 0.0–2.0)
Bicarbonate: 28.8 mmol/L — ABNORMAL HIGH (ref 20.0–28.0)
Bicarbonate: 27.5 mmol/L (ref 20.0–28.0)
Calcium, Ion: 1.17 mmol/L (ref 1.15–1.40)
Calcium, Ion: 1.12 mmol/L — ABNORMAL LOW (ref 1.15–1.40)
HCT: 31 % — ABNORMAL LOW (ref 39.0–52.0)
HCT: 26 % — ABNORMAL LOW (ref 39.0–52.0)
Hemoglobin: 10.5 g/dL — ABNORMAL LOW (ref 13.0–17.0)
Hemoglobin: 8.8 g/dL — ABNORMAL LOW (ref 13.0–17.0)
O2 Saturation: 100 %
O2 Saturation: 100 %
Potassium: 3.8 mmol/L (ref 3.5–5.1)
Potassium: 3.5 mmol/L (ref 3.5–5.1)
Sodium: 140 mmol/L (ref 135–145)
Sodium: 140 mmol/L (ref 135–145)
TCO2: 30 mmol/L (ref 22–32)
TCO2: 29 mmol/L (ref 22–32)
pCO2 arterial: 36.9 mmHg (ref 32.0–48.0)
pCO2 arterial: 44.7 mmHg (ref 32.0–48.0)
pH, Arterial: 7.5 — ABNORMAL HIGH (ref 7.350–7.450)
pH, Arterial: 7.397 (ref 7.350–7.450)
pO2, Arterial: 178 mmHg — ABNORMAL HIGH (ref 83.0–108.0)
pO2, Arterial: 178 mmHg — ABNORMAL HIGH (ref 83.0–108.0)

## 2020-01-18 LAB — POCT I-STAT, CHEM 8
BUN: 16 mg/dL (ref 8–23)
BUN: 17 mg/dL (ref 8–23)
BUN: 15 mg/dL (ref 8–23)
Calcium, Ion: 1.12 mmol/L — ABNORMAL LOW (ref 1.15–1.40)
Calcium, Ion: 1.18 mmol/L (ref 1.15–1.40)
Calcium, Ion: 1.08 mmol/L — ABNORMAL LOW (ref 1.15–1.40)
Chloride: 100 mmol/L (ref 98–111)
Chloride: 101 mmol/L (ref 98–111)
Chloride: 100 mmol/L (ref 98–111)
Creatinine, Ser: 0.7 mg/dL (ref 0.61–1.24)
Creatinine, Ser: 0.7 mg/dL (ref 0.61–1.24)
Creatinine, Ser: 0.6 mg/dL — ABNORMAL LOW (ref 0.61–1.24)
Glucose, Bld: 168 mg/dL — ABNORMAL HIGH (ref 70–99)
Glucose, Bld: 183 mg/dL — ABNORMAL HIGH (ref 70–99)
Glucose, Bld: 162 mg/dL — ABNORMAL HIGH (ref 70–99)
HCT: 26 % — ABNORMAL LOW (ref 39.0–52.0)
HCT: 28 % — ABNORMAL LOW (ref 39.0–52.0)
HCT: 21 % — ABNORMAL LOW (ref 39.0–52.0)
Hemoglobin: 8.8 g/dL — ABNORMAL LOW (ref 13.0–17.0)
Hemoglobin: 9.5 g/dL — ABNORMAL LOW (ref 13.0–17.0)
Hemoglobin: 7.1 g/dL — ABNORMAL LOW (ref 13.0–17.0)
Potassium: 3.8 mmol/L (ref 3.5–5.1)
Potassium: 3.8 mmol/L (ref 3.5–5.1)
Potassium: 3.6 mmol/L (ref 3.5–5.1)
Sodium: 139 mmol/L (ref 135–145)
Sodium: 139 mmol/L (ref 135–145)
Sodium: 139 mmol/L (ref 135–145)
TCO2: 26 mmol/L (ref 22–32)
TCO2: 29 mmol/L (ref 22–32)
TCO2: 26 mmol/L (ref 22–32)

## 2020-01-18 LAB — GLUCOSE, CAPILLARY
Glucose-Capillary: 171 mg/dL — ABNORMAL HIGH (ref 70–99)
Glucose-Capillary: 262 mg/dL — ABNORMAL HIGH (ref 70–99)
Glucose-Capillary: 170 mg/dL — ABNORMAL HIGH (ref 70–99)
Glucose-Capillary: 162 mg/dL — ABNORMAL HIGH (ref 70–99)

## 2020-01-18 LAB — PREPARE RBC (CROSSMATCH)

## 2020-01-18 LAB — HEMOGLOBIN AND HEMATOCRIT, BLOOD
HCT: 29.7 % — ABNORMAL LOW (ref 39.0–52.0)
Hemoglobin: 9.9 g/dL — ABNORMAL LOW (ref 13.0–17.0)

## 2020-01-18 SURGERY — ECHOCARDIOGRAM, TRANSTHORACIC
Anesthesia: General | Site: Chest

## 2020-01-18 MED ORDER — ACETAMINOPHEN 325 MG PO TABS
650.0000 mg | ORAL_TABLET | Freq: Four times a day (QID) | ORAL | Status: DC | PRN
  Administered 2020-01-19 – 2020-01-24 (×3): 650 mg via ORAL
  Filled 2020-01-18 (×3): qty 2

## 2020-01-18 MED ORDER — ONDANSETRON HCL 4 MG/2ML IJ SOLN: 4.0000 mg | Freq: Four times a day (QID) | INTRAMUSCULAR | Status: DC | PRN

## 2020-01-18 MED ORDER — ONDANSETRON HCL 4 MG/2ML IJ SOLN
INTRAMUSCULAR | Status: DC | PRN
  Administered 2020-01-18: 4 mg via INTRAVENOUS

## 2020-01-18 MED ORDER — SODIUM CHLORIDE 0.9 % IV SOLN: 250.0000 mL | INTRAVENOUS | Status: DC | PRN

## 2020-01-18 MED ORDER — PHENYLEPHRINE HCL-NACL 20-0.9 MG/250ML-% IV SOLN
0.0000 ug/min | INTRAVENOUS | Status: DC
Start: 2020-01-18 — End: 2020-01-18

## 2020-01-18 MED ORDER — CHLORHEXIDINE GLUCONATE CLOTH 2 % EX PADS
6.0000 | MEDICATED_PAD | Freq: Every day | CUTANEOUS | Status: DC
  Administered 2020-01-19 – 2020-01-25 (×3): 6 via TOPICAL

## 2020-01-18 MED ORDER — PRAVASTATIN SODIUM 40 MG PO TABS
40.0000 mg | ORAL_TABLET | Freq: Every day | ORAL | Status: DC
  Administered 2020-01-19 – 2020-01-28 (×10): 40 mg via ORAL
  Filled 2020-01-18 (×10): qty 1

## 2020-01-18 MED ORDER — INSULIN ASPART 100 UNIT/ML ~~LOC~~ SOLN
0.0000 [IU] | Freq: Three times a day (TID) | SUBCUTANEOUS | Status: DC
  Administered 2020-01-18: 22:00:00 4 [IU] via SUBCUTANEOUS
  Administered 2020-01-18: 18:00:00 12 [IU] via SUBCUTANEOUS
  Administered 2020-01-19: 4 [IU] via SUBCUTANEOUS
  Administered 2020-01-19: 8 [IU] via SUBCUTANEOUS
  Administered 2020-01-19: 22:00:00 4 [IU] via SUBCUTANEOUS
  Administered 2020-01-20: 20 [IU] via SUBCUTANEOUS
  Administered 2020-01-20: 2 [IU] via SUBCUTANEOUS
  Administered 2020-01-20 – 2020-01-21 (×2): 4 [IU] via SUBCUTANEOUS
  Administered 2020-01-21: 8 [IU] via SUBCUTANEOUS
  Administered 2020-01-21: 4 [IU] via SUBCUTANEOUS
  Administered 2020-01-22: 2 [IU] via SUBCUTANEOUS
  Administered 2020-01-22: 8 [IU] via SUBCUTANEOUS
  Administered 2020-01-22 (×2): 4 [IU] via SUBCUTANEOUS
  Administered 2020-01-23: 8 [IU] via SUBCUTANEOUS
  Administered 2020-01-23 (×2): 4 [IU] via SUBCUTANEOUS
  Administered 2020-01-24: 8 [IU] via SUBCUTANEOUS
  Administered 2020-01-24: 4 [IU] via SUBCUTANEOUS
  Administered 2020-01-24: 2 [IU] via SUBCUTANEOUS
  Administered 2020-01-25: 4 [IU] via SUBCUTANEOUS
  Administered 2020-01-25 – 2020-01-26 (×2): 2 [IU] via SUBCUTANEOUS
  Administered 2020-01-26: 8 [IU] via SUBCUTANEOUS
  Administered 2020-01-26 (×2): 2 [IU] via SUBCUTANEOUS
  Administered 2020-01-27: 4 [IU] via SUBCUTANEOUS
  Administered 2020-01-27: 2 [IU] via SUBCUTANEOUS
  Administered 2020-01-27: 4 [IU] via SUBCUTANEOUS
  Administered 2020-01-28: 2 [IU] via SUBCUTANEOUS
  Administered 2020-01-28: 4 [IU] via SUBCUTANEOUS

## 2020-01-18 MED ORDER — PANTOPRAZOLE SODIUM 20 MG PO TBEC
20.0000 mg | DELAYED_RELEASE_TABLET | Freq: Every day | ORAL | Status: DC
  Administered 2020-01-19 – 2020-01-28 (×10): 20 mg via ORAL
  Filled 2020-01-18 (×10): qty 1

## 2020-01-18 MED ORDER — PROPOFOL 10 MG/ML IV BOLUS
INTRAVENOUS | Status: AC
  Filled 2020-01-18: qty 20

## 2020-01-18 MED ORDER — HEPARIN SODIUM (PORCINE) 1000 UNIT/ML IJ SOLN
INTRAMUSCULAR | Status: AC
  Filled 2020-01-18: qty 1

## 2020-01-18 MED ORDER — SUGAMMADEX SODIUM 200 MG/2ML IV SOLN
INTRAVENOUS | Status: DC | PRN
  Administered 2020-01-18: 130 mg via INTRAVENOUS

## 2020-01-18 MED ORDER — PHENYLEPHRINE HCL (PRESSORS) 10 MG/ML IV SOLN
INTRAVENOUS | Status: DC | PRN
  Administered 2020-01-18: 80 ug via INTRAVENOUS
  Administered 2020-01-18 (×2): 40 ug via INTRAVENOUS

## 2020-01-18 MED ORDER — ONDANSETRON HCL 4 MG/2ML IJ SOLN
INTRAMUSCULAR | Status: AC
  Filled 2020-01-18: qty 2

## 2020-01-18 MED ORDER — FERROUS SULFATE 325 (65 FE) MG PO TABS
325.0000 mg | ORAL_TABLET | Freq: Every day | ORAL | Status: DC
  Administered 2020-01-19 – 2020-01-28 (×10): 325 mg via ORAL
  Filled 2020-01-18 (×10): qty 1

## 2020-01-18 MED ORDER — ALBUMIN HUMAN 5 % IV SOLN: INTRAVENOUS | Status: DC | PRN

## 2020-01-18 MED ORDER — FENTANYL CITRATE (PF) 250 MCG/5ML IJ SOLN
INTRAMUSCULAR | Status: DC | PRN
  Administered 2020-01-18: 50 ug via INTRAVENOUS
  Administered 2020-01-18 (×4): 25 ug via INTRAVENOUS

## 2020-01-18 MED ORDER — SODIUM CHLORIDE 0.9% FLUSH
3.0000 mL | Freq: Two times a day (BID) | INTRAVENOUS | Status: DC
  Administered 2020-01-19 – 2020-01-28 (×19): 3 mL via INTRAVENOUS

## 2020-01-18 MED ORDER — PHENYLEPHRINE HCL-NACL 10-0.9 MG/250ML-% IV SOLN: 0.0000 ug/min | INTRAVENOUS | Status: DC

## 2020-01-18 MED ORDER — VANCOMYCIN HCL IN DEXTROSE 1-5 GM/200ML-% IV SOLN
1000.0000 mg | Freq: Once | INTRAVENOUS | Status: AC
  Administered 2020-01-19: 1000 mg via INTRAVENOUS
  Filled 2020-01-18: qty 200

## 2020-01-18 MED ORDER — CHLORHEXIDINE GLUCONATE 4 % EX LIQD: 60.0000 mL | Freq: Once | CUTANEOUS | Status: DC

## 2020-01-18 MED ORDER — ASPIRIN 81 MG PO CHEW
81.0000 mg | CHEWABLE_TABLET | Freq: Every day | ORAL | Status: DC
  Administered 2020-01-19 – 2020-01-23 (×5): 81 mg via ORAL
  Filled 2020-01-18 (×5): qty 1

## 2020-01-18 MED ORDER — SODIUM CHLORIDE 0.9 % IV SOLN
1.5000 g | Freq: Two times a day (BID) | INTRAVENOUS | Status: AC
  Administered 2020-01-18 – 2020-01-20 (×4): 1.5 g via INTRAVENOUS
  Filled 2020-01-18 (×5): qty 1.5

## 2020-01-18 MED ORDER — LIDOCAINE HCL 1 % IJ SOLN
INTRAMUSCULAR | Status: DC | PRN
  Administered 2020-01-18: 10 mL

## 2020-01-18 MED ORDER — SODIUM CHLORIDE 0.9% IV SOLUTION: Freq: Once | INTRAVENOUS | Status: AC

## 2020-01-18 MED ORDER — OXYCODONE HCL 5 MG PO TABS
5.0000 mg | ORAL_TABLET | ORAL | Status: DC | PRN
  Administered 2020-01-18: 10 mg via ORAL
  Administered 2020-01-18: 16:00:00 5 mg via ORAL
  Filled 2020-01-18: qty 1
  Filled 2020-01-18: qty 2

## 2020-01-18 MED ORDER — ACETAMINOPHEN 650 MG RE SUPP: 650.0000 mg | Freq: Four times a day (QID) | RECTAL | Status: DC | PRN

## 2020-01-18 MED ORDER — SODIUM CHLORIDE 0.9 % IV SOLN
INTRAVENOUS | Status: AC
  Filled 2020-01-18 (×3): qty 1.2

## 2020-01-18 MED ORDER — PROTAMINE SULFATE 10 MG/ML IV SOLN
INTRAVENOUS | Status: AC
  Filled 2020-01-18: qty 25

## 2020-01-18 MED ORDER — MORPHINE SULFATE (PF) 2 MG/ML IV SOLN
1.0000 mg | INTRAVENOUS | Status: DC | PRN
  Administered 2020-01-18 (×2): 2 mg via INTRAVENOUS
  Filled 2020-01-18 (×2): qty 1

## 2020-01-18 MED ORDER — NITROGLYCERIN IN D5W 200-5 MCG/ML-% IV SOLN
0.0000 ug/min | INTRAVENOUS | Status: DC
  Administered 2020-01-18: 16:00:00 5 ug/min via INTRAVENOUS
  Filled 2020-01-18: qty 250

## 2020-01-18 MED ORDER — ROCURONIUM BROMIDE 100 MG/10ML IV SOLN
INTRAVENOUS | Status: DC | PRN
Start: 2020-01-18 — End: 2020-01-18
  Administered 2020-01-18: 60 mg via INTRAVENOUS

## 2020-01-18 MED ORDER — AMLODIPINE BESYLATE 10 MG PO TABS
10.0000 mg | ORAL_TABLET | Freq: Once | ORAL | Status: AC
  Administered 2020-01-18: 10 mg via ORAL
  Filled 2020-01-18: qty 1

## 2020-01-18 MED ORDER — HEPARIN SODIUM (PORCINE) 1000 UNIT/ML IJ SOLN
INTRAMUSCULAR | Status: DC | PRN
  Administered 2020-01-18: 10000 [IU] via INTRAVENOUS

## 2020-01-18 MED ORDER — CHLORHEXIDINE GLUCONATE 4 % EX LIQD: 30.0000 mL | CUTANEOUS | Status: DC

## 2020-01-18 MED ORDER — SODIUM CHLORIDE 0.9 % IV SOLN: INTRAVENOUS | Status: AC

## 2020-01-18 MED ORDER — CHLORHEXIDINE GLUCONATE 0.12 % MT SOLN
15.0000 mL | Freq: Once | OROMUCOSAL | Status: AC
  Administered 2020-01-18: 15 mL via OROMUCOSAL
  Filled 2020-01-18: qty 15

## 2020-01-18 MED ORDER — TRAMADOL HCL 50 MG PO TABS
50.0000 mg | ORAL_TABLET | ORAL | Status: DC | PRN
  Administered 2020-01-19: 100 mg via ORAL
  Filled 2020-01-18: qty 2

## 2020-01-18 MED ORDER — HEMOSTATIC AGENTS (NO CHARGE) OPTIME
TOPICAL | Status: DC | PRN
  Administered 2020-01-18: 1 via TOPICAL

## 2020-01-18 MED ORDER — PROTAMINE SULFATE 10 MG/ML IV SOLN
INTRAVENOUS | Status: DC | PRN
Start: 2020-01-18 — End: 2020-01-18
  Administered 2020-01-18: 100 mg via INTRAVENOUS

## 2020-01-18 MED ORDER — SODIUM CHLORIDE 0.9 % IV SOLN
250.0000 mL | INTRAVENOUS | Status: DC
  Administered 2020-01-18 – 2020-01-19 (×2): 250 mL via INTRAVENOUS

## 2020-01-18 MED ORDER — SODIUM CHLORIDE 0.9% FLUSH
3.0000 mL | INTRAVENOUS | Status: DC | PRN
  Administered 2020-01-19: 3 mL via INTRAVENOUS

## 2020-01-18 MED ORDER — CLOPIDOGREL BISULFATE 75 MG PO TABS
75.0000 mg | ORAL_TABLET | Freq: Every day | ORAL | Status: DC
  Administered 2020-01-19 – 2020-01-28 (×10): 75 mg via ORAL
  Filled 2020-01-18 (×10): qty 1

## 2020-01-18 MED ORDER — PHENYLEPHRINE HCL-NACL 10-0.9 MG/250ML-% IV SOLN
INTRAVENOUS | Status: DC | PRN
  Administered 2020-01-18: 20 ug/min via INTRAVENOUS

## 2020-01-18 MED ORDER — LACTATED RINGERS IV SOLN: INTRAVENOUS | Status: DC | PRN

## 2020-01-18 MED ORDER — SODIUM CHLORIDE 0.9 % IV SOLN
INTRAVENOUS | Status: DC | PRN
  Administered 2020-01-18: 1500 mL via INTRAMUSCULAR

## 2020-01-18 MED ORDER — SODIUM CHLORIDE 0.9 % IV SOLN: INTRAVENOUS | Status: DC

## 2020-01-18 MED ORDER — FENTANYL CITRATE (PF) 250 MCG/5ML IJ SOLN
INTRAMUSCULAR | Status: AC
  Filled 2020-01-18: qty 5

## 2020-01-18 MED ORDER — PROPOFOL 10 MG/ML IV BOLUS
INTRAVENOUS | Status: DC | PRN
  Administered 2020-01-18: 100 mg via INTRAVENOUS

## 2020-01-18 MED ORDER — IODIXANOL 320 MG/ML IV SOLN
INTRAVENOUS | Status: DC | PRN
  Administered 2020-01-18: 59.4 mL

## 2020-01-18 SURGICAL SUPPLY — 116 items
ADH SKN CLS APL DERMABOND .7 (GAUZE/BANDAGES/DRESSINGS) ×15
APL PRP STRL LF DISP 70% ISPRP (MISCELLANEOUS) ×5
BAG DECANTER FOR FLEXI CONT (MISCELLANEOUS) ×4 IMPLANT
BAG SNAP BAND KOVER 36X36 (MISCELLANEOUS) ×7 IMPLANT
BLADE CLIPPER SURG (BLADE) IMPLANT
BLADE OSCILLATING /SAGITTAL (BLADE) IMPLANT
BLADE STERNUM SYSTEM 6 (BLADE) IMPLANT
BLADE SURG 10 STRL SS (BLADE) IMPLANT
CABLE ADAPT CONN TEMP 6FT (ADAPTER) ×7 IMPLANT
CANISTER SUCT 3000ML PPV (MISCELLANEOUS) IMPLANT
CANNULA FEM VENOUS REMOTE 22FR (CANNULA) IMPLANT
CANNULA OPTISITE PERFUSION 16F (CANNULA) IMPLANT
CANNULA OPTISITE PERFUSION 18F (CANNULA) IMPLANT
CATH DIAG EXPO 6F AL1 (CATHETERS) ×4 IMPLANT
CATH DIAG EXPO 6F FR4 (CATHETERS) ×4 IMPLANT
CATH DIAG EXPO 6F VENT PIG 145 (CATHETERS) ×14 IMPLANT
CATH EXTERNAL FEMALE PUREWICK (CATHETERS) IMPLANT
CATH INFINITI 6F AL2 (CATHETERS) IMPLANT
CATH S G BIP PACING (CATHETERS) ×7 IMPLANT
CATH SHOCKWAVE 7.0X60 (CATHETERS) ×4 IMPLANT
CATH SOFT-VU ST 4F 90CM (CATHETERS) ×4 IMPLANT
CHLORAPREP W/TINT 26 (MISCELLANEOUS) ×7 IMPLANT
CLIP VESOCCLUDE MED 24/CT (CLIP) ×4 IMPLANT
CLIP VESOCCLUDE SM WIDE 24/CT (CLIP) ×4 IMPLANT
CNTNR URN SCR LID CUP LEK RST (MISCELLANEOUS) ×10 IMPLANT
CONT SPEC 4OZ STRL OR WHT (MISCELLANEOUS) ×14
COVER BACK TABLE 24X17X13 BIG (DRAPES) IMPLANT
COVER BACK TABLE 80X110 HD (DRAPES) ×14 IMPLANT
COVER DOME SNAP 22 D (MISCELLANEOUS) ×4 IMPLANT
COVER WAND RF STERILE (DRAPES) ×7 IMPLANT
DECANTER SPIKE VIAL GLASS SM (MISCELLANEOUS) ×3 IMPLANT
DERMABOND ADVANCED (GAUZE/BANDAGES/DRESSINGS) ×6
DERMABOND ADVANCED .7 DNX12 (GAUZE/BANDAGES/DRESSINGS) ×9 IMPLANT
DEVICE CLOSURE PERCLS PRGLD 6F (VASCULAR PRODUCTS) ×16 IMPLANT
DRAPE INCISE IOBAN 66X45 STRL (DRAPES) IMPLANT
DRSG TEGADERM 4X4.75 (GAUZE/BANDAGES/DRESSINGS) ×14 IMPLANT
ELECT CAUTERY BLADE 6.4 (BLADE) IMPLANT
ELECT REM PT RETURN 9FT ADLT (ELECTROSURGICAL) ×14
ELECTRODE REM PT RTRN 9FT ADLT (ELECTROSURGICAL) ×10 IMPLANT
FELT TEFLON 1X6 (MISCELLANEOUS) ×4 IMPLANT
FELT TEFLON 6X6 (MISCELLANEOUS) ×7 IMPLANT
FEMORAL VENOUS CANN RAP (CANNULA) IMPLANT
GAUZE SPONGE 4X4 12PLY STRL (GAUZE/BANDAGES/DRESSINGS) ×7 IMPLANT
GLOVE BIO SURGEON STRL SZ7.5 (GLOVE) IMPLANT
GLOVE BIO SURGEON STRL SZ8 (GLOVE) IMPLANT
GLOVE EUDERMIC 7 POWDERFREE (GLOVE) IMPLANT
GLOVE ORTHO TXT STRL SZ7.5 (GLOVE) IMPLANT
GOWN STRL REUS W/ TWL LRG LVL3 (GOWN DISPOSABLE) ×4 IMPLANT
GOWN STRL REUS W/ TWL XL LVL3 (GOWN DISPOSABLE) ×5 IMPLANT
GOWN STRL REUS W/TWL LRG LVL3 (GOWN DISPOSABLE) ×14
GOWN STRL REUS W/TWL XL LVL3 (GOWN DISPOSABLE) ×7
GUIDEWIRE SAF TJ AMPL .035X180 (WIRE) ×7 IMPLANT
GUIDEWIRE SAFE TJ AMPLATZ EXST (WIRE) ×7 IMPLANT
GUIDEWIRE STR TIP .014X300X8 (WIRE) ×4 IMPLANT
INSERT FOGARTY SM (MISCELLANEOUS) IMPLANT
KIT BASIN OR (CUSTOM PROCEDURE TRAY) ×7 IMPLANT
KIT DILATOR VASC 18G NDL (KITS) IMPLANT
KIT ENCORE 26 ADVANTAGE (KITS) ×4 IMPLANT
KIT HEART LEFT (KITS) ×7 IMPLANT
KIT SUCTION CATH 14FR (SUCTIONS) IMPLANT
KIT TURNOVER KIT B (KITS) ×7 IMPLANT
LOOP VESSEL MAXI BLUE (MISCELLANEOUS) ×8 IMPLANT
LOOP VESSEL MINI RED (MISCELLANEOUS) ×4 IMPLANT
NDL HYPO 25GX1X1/2 BEV (NEEDLE) IMPLANT
NDL PERC 18GX7CM (NEEDLE) ×3 IMPLANT
NEEDLE 22X1 1/2 (OR ONLY) (NEEDLE) IMPLANT
NEEDLE HYPO 25GX1X1/2 BEV (NEEDLE) ×7 IMPLANT
NEEDLE PERC 18GX7CM (NEEDLE) ×7 IMPLANT
NS IRRIG 1000ML POUR BTL (IV SOLUTION) ×21 IMPLANT
PACK ENDO MINOR (CUSTOM PROCEDURE TRAY) ×3 IMPLANT
PACK ENDOVASCULAR (PACKS) ×7 IMPLANT
PAD ARMBOARD 7.5X6 YLW CONV (MISCELLANEOUS) ×14 IMPLANT
PAD ELECT DEFIB RADIOL ZOLL (MISCELLANEOUS) ×7 IMPLANT
PENCIL BUTTON HOLSTER BLD 10FT (ELECTRODE) ×7 IMPLANT
PERCLOSE PROGLIDE 6F (VASCULAR PRODUCTS) ×35
POSITIONER HEAD DONUT 9IN (MISCELLANEOUS) ×7 IMPLANT
SET MICROPUNCTURE 5F STIFF (MISCELLANEOUS) ×7 IMPLANT
SHEATH BRITE TIP 6FR 35CM (SHEATH) ×7 IMPLANT
SHEATH BRITE TIP 7FR 35CM (SHEATH) ×7 IMPLANT
SHEATH PINNACLE 6F 10CM (SHEATH) ×7 IMPLANT
SHEATH PINNACLE 8F 10CM (SHEATH) ×7 IMPLANT
SLEEVE ISOL F/PACE RF HD COVER (MISCELLANEOUS) ×4 IMPLANT
SLEEVE REPOSITIONING LENGTH 30 (MISCELLANEOUS) ×7 IMPLANT
SPONGE LAP 4X18 RFD (DISPOSABLE) ×4 IMPLANT
STOPCOCK MORSE 400PSI 3WAY (MISCELLANEOUS) ×18 IMPLANT
SUT ETHIBOND X763 2 0 SH 1 (SUTURE) IMPLANT
SUT GORETEX CV 4 TH 22 36 (SUTURE) ×12 IMPLANT
SUT GORETEX CV4 TH-18 (SUTURE) IMPLANT
SUT MNCRL AB 3-0 PS2 18 (SUTURE) ×4 IMPLANT
SUT PROLENE 5 0 C 1 24 (SUTURE) ×28 IMPLANT
SUT PROLENE 5 0 C 1 36 (SUTURE) IMPLANT
SUT PROLENE 6 0 C 1 30 (SUTURE) IMPLANT
SUT SILK  1 MH (SUTURE) ×7
SUT SILK 1 MH (SUTURE) ×5 IMPLANT
SUT SILK 2 0 (SUTURE) ×7
SUT SILK 2-0 18XBRD TIE 12 (SUTURE) ×2 IMPLANT
SUT VIC AB 2-0 CT1 27 (SUTURE) ×7
SUT VIC AB 2-0 CT1 TAPERPNT 27 (SUTURE) ×2 IMPLANT
SUT VIC AB 2-0 CTX 36 (SUTURE) IMPLANT
SUT VIC AB 3-0 SH 8-18 (SUTURE) IMPLANT
SYR 50ML LL SCALE MARK (SYRINGE) ×11 IMPLANT
SYR BULB IRRIG 60ML STRL (SYRINGE) ×4 IMPLANT
SYR CONTROL 10ML LL (SYRINGE) IMPLANT
SYR MEDRAD MARK V 150ML (SYRINGE) ×7 IMPLANT
TOWEL GREEN STERILE (TOWEL DISPOSABLE) ×14 IMPLANT
TOWEL GREEN STERILE FF (TOWEL DISPOSABLE) ×7 IMPLANT
TRANSDUCER W/STOPCOCK (MISCELLANEOUS) ×14 IMPLANT
TRAY CATH 16FR W/PLASTIC CATH (SET/KITS/TRAYS/PACK) ×4 IMPLANT
TRAY FOLEY SLVR 14FR TEMP STAT (SET/KITS/TRAYS/PACK) IMPLANT
TRAY FOLEY SLVR 16FR TEMP STAT (SET/KITS/TRAYS/PACK) IMPLANT
TUBE SUCT INTRACARD DLP 20F (MISCELLANEOUS) IMPLANT
VALVE 26 ULTRA SAPIEN KIT (Valve) ×4 IMPLANT
WIRE EMERALD 3MM-J .035X150CM (WIRE) ×7 IMPLANT
WIRE EMERALD 3MM-J .035X260CM (WIRE) ×7 IMPLANT
WIRE HI TORQ VERSACORE J 260CM (WIRE) ×4 IMPLANT
WIRE STIFF LUNDERQUIST 260CM (WIRE) ×4 IMPLANT

## 2020-01-18 NOTE — Anesthesia Procedure Notes (Signed)
Procedure Name: MAC Date/Time: 01/18/2020 11:15 AM Performed by: Amadeo Garnet, CRNA Pre-anesthesia Checklist: Patient identified, Emergency Drugs available, Suction available, Patient being monitored and Timeout performed Patient Re-evaluated:Patient Re-evaluated prior to induction Oxygen Delivery Method: Simple face mask Preoxygenation: Pre-oxygenation with 100% oxygen Induction Type: IV induction Placement Confirmation: positive ETCO2 Dental Injury: Teeth and Oropharynx as per pre-operative assessment

## 2020-01-18 NOTE — Interval H&P Note (Signed)
History and Physical Interval Note:  01/18/2020 11:08 AM  Garrett Hughes  has presented today for surgery, with the diagnosis of Severe Aortic Stenosis.  The various methods of treatment have been discussed with the patient and family. After consideration of risks, benefits and other options for treatment, the patient has consented to  Procedure(s): TRANSCATHETER AORTIC VALVE REPLACEMENT, TRANSFEMORAL (N/A) POSSIBLE TRANSCATHETER AORTIC VALVE REPLACEMENT, LEFT SUBCLAVIAN (Left) TRANSESOPHAGEAL ECHOCARDIOGRAM (TEE) (N/A) LOWER EXTREMITY ANGIOGRAM with intervention (Bilateral) as a surgical intervention.  The patient's history has been reviewed, patient examined, no change in status, stable for surgery.  I have reviewed the patient's chart and labs.  Questions were answered to the patient's satisfaction.    We plan to proceed from a transfemoral approach facilitated by balloon lithotripsy.  Left subclavian access will be our backup plan.  The patient continues to have New York Heart Association functional class III symptoms with elevated BNP, symptoms consistent with acute on chronic diastolic heart failure.  [name]

## 2020-01-18 NOTE — Anesthesia Procedure Notes (Signed)
Arterial Line Insertion Start/End5/07/2020 10:16 AM, 01/18/2020 10:16 AM Performed by: Rachel Moulds, CRNA, CRNA  Patient location: Pre-op. Preanesthetic checklist: patient identified, IV checked, site marked, risks and benefits discussed, surgical consent, monitors and equipment checked, pre-op evaluation, timeout performed and anesthesia consent Lidocaine 1% used for infiltration Right, radial was placed Catheter size: 20 G Hand hygiene performed , maximum sterile barriers used  and Seldinger technique used Allen's test indicative of satisfactory collateral circulation Attempts: 2 Procedure performed without using ultrasound guided technique. Following insertion, dressing applied and Biopatch. Post procedure assessment: normal  Post procedure complications: local hematoma. Patient tolerated the procedure well with no immediate complications.

## 2020-01-18 NOTE — Anesthesia Procedure Notes (Signed)
Procedure Name: Intubation Date/Time: 01/18/2020 12:53 PM Performed by: Amadeo Garnet, CRNA Pre-anesthesia Checklist: Patient identified, Emergency Drugs available, Suction available and Patient being monitored Patient Re-evaluated:Patient Re-evaluated prior to induction Oxygen Delivery Method: Circle system utilized Preoxygenation: Pre-oxygenation with 100% oxygen Induction Type: IV induction Ventilation: Mask ventilation without difficulty Laryngoscope Size: Mac and 4 Grade View: Grade I Tube type: Oral Tube size: 7.0 mm Number of attempts: 1 Airway Equipment and Method: Stylet Placement Confirmation: ETT inserted through vocal cords under direct vision,  breath sounds checked- equal and bilateral and positive ETCO2 Secured at: 21 cm Tube secured with: Tape Dental Injury: Teeth and Oropharynx as per pre-operative assessment

## 2020-01-18 NOTE — Progress Notes (Signed)
TCTS BRIEF SICU PROGRESS NOTE  Day of Surgery  S/P Procedure(s) (LRB): LOWER EXTREMITY ANGIOGRAM with intravascular shockwave lithotripsy (Bilateral) Intraoperative Transthoracic Echocardiogram (N/A) Intraoperative Transesophageal Echocardiogram Transcatheter Aortic Valve Replacement (subclavian approach) (Left) Aortogram   Looks good and feels well.  Denies pain Afib w/ stable HR and BP Breathing comfortably w/ O2 sats 98% on 2 L/min Incision and both groins stable  Plan: Continue current plan  Purcell Nails, MD 01/18/2020 7:22 PM

## 2020-01-18 NOTE — Op Note (Signed)
HEART AND VASCULAR CENTER   MULTIDISCIPLINARY HEART VALVE TEAM   TAVR OPERATIVE NOTE   Date of Procedure:  01/18/2020  Preoperative Diagnosis: Severe Aortic Stenosis   Postoperative Diagnosis: Same   Procedure:    Transcatheter Aortic Valve Replacement - Left Trans-Subclavian Approach  Edwards Sapien 3 UltraTHV (size 26 mm, model # 9750TFX, serial # [id number])   Co-Surgeons:  Valentina Gu. Roxy Manns, MD and Sherren Mocha, MD  Anesthesiologist:  Roberts Gaudy, MD  Echocardiographer:  Sanda Klein, MD  Pre-operative Echo Findings:  Severe aortic stenosis  Normal left ventricular systolic function  Post-operative Echo Findings:  No paravalvular leak  Normal left ventricular systolic function    BRIEF CLINICAL NOTE AND INDICATIONS FOR SURGERY  Patient is a 79 year old male with history of aortic stenosis, coronary artery disease status post coronary artery bypass grafting in 1997 and recently status post PCI and stenting of the right coronary artery, asymptomatic bilateral carotid artery stenosis with high-grade right internal carotid artery stenosis, longstanding persistent atrial fibrillation not currently on anticoagulation, hypertension, type 2 diabetes mellitus, Barrett's esophagus, GE reflux disease, thrombocytopenia, and hyperlipidemia who has been referred for surgical consultation to discuss treatment options for management of severe symptomatic aortic stenosis and multivessel coronary artery disease.  Patient's cardiac history dates back to 1997 when he first presented with coronary artery disease.  He underwent coronary artery bypass grafting x3 at [facility] in [place].  He has been followed for the last several years by Dr. Stanford Breed with history of aortic stenosis, coronary artery disease, and persistent atrial fibrillation.  He was anticoagulated using warfarin for a period of time but this was stopped because of problems with GI bleeding and  hematuria and difficulty maintaining therapeutic levels.  Patient has known history of aortic stenosis that has progressed on serial follow-up echocardiograms.  He also has a long history of exertional shortness of breath that has slowly progressed and become quite problematic over the last few months.  Recent echocardiogram performed October 19, 2019 revealed normal left ventricular systolic function with severe aortic stenosis, moderate aortic insufficiency and moderate mitral regurgitation.  He was seen in follow-up by Dr. Stanford Breed and referred to the multidisciplinary heart valve clinic.  Diagnostic cardiac catheterization performed by Dr. Burt Knack on November 26, 2019 revealed severe multivessel coronary artery disease with chronic occlusion of the left coronary artery and severe high-grade stenosis of the right coronary artery.  There was continued patency of left internal mammary artery to the left anterior descending coronary artery and greater saphenous vein graft to the obtuse marginal branch.  Right internal mammary artery graft previously placed to the right coronary artery was atretic.  The patient's case was reviewed by multidisciplinary team of specialists and the patient subsequently was brought back to the Cath Lab on December 24, 2019 for PCI and stenting of the right coronary artery.  This procedure was complicated by coronary perforation requiring placement of a covered stent.  Following the procedure the patient had 2 days of chest pain but serial echocardiograms revealed no significant pericardial effusion.  The patient was discharged from the hospital but readmitted December 28, 2019 following a syncopal episode that was felt likely to be related to his aortic stenosis, atrial fibrillation, and dehydration.  Medications were adjusted and a ZIO patch monitor placed.  Since hospital discharge the patient has been seen in follow-up on 1 occasion at [facility].  He was subsequently referred for  surgical consultation.  During the course of the patient's  preoperative work up they have been evaluated comprehensively by a multidisciplinary team of specialists coordinated through the Multidisciplinary Heart Valve Clinic in the [facility].  They have been demonstrated to suffer from symptomatic severe aortic stenosis as noted above. The patient has been counseled extensively as to the relative risks and benefits of all options for the treatment of severe aortic stenosis including long term medical therapy, conventional surgery for aortic valve replacement, and transcatheter aortic valve replacement.  All questions have been answered, and the patient provides full informed consent for the operation as described.   DETAILS OF THE OPERATIVE PROCEDURE  PREPARATION:    The patient is brought to the operating room on the above mentioned date and appropriate monitoring was established by the anesthesia team. The patient is placed in the supine position on the operating table.  Intravenous antibiotics are administered. The patient is initially monitored closely throughout the procedure under conscious sedation.  Baseline transthoracic echocardiogram was performed. The patient's chest, abdomen, both groins, and both lower extremities are prepared and draped in a sterile manner. A time out procedure is performed.   PERIPHERAL ACCESS:    Using the modified Seldinger technique, femoral arterial and venous access was obtained with placement of 6 Fr sheaths on the left side.  A pigtail diagnostic catheter was passed through the left arterial sheath under fluoroscopic guidance into the aortic root.  A temporary transvenous pacemaker catheter was passed through the left femoral venous sheath under fluoroscopic guidance into the right ventricle.  The pacemaker was tested to ensure stable lead placement and pacemaker capture. Aortic root angiography was performed in order to determine  the optimal angiographic angle for valve deployment.   TRANSFEMORAL ACCESS AND INTRAVASCULAR LITHOTRIPSY:   Percutaneous transfemoral access and sheath placement was performed using ultrasound guidance.  The right common femoral artery was cannulated using a micropuncture needle and appropriate location was verified using hand injection angiogram.  A pair of Abbott Perclose percutaneous closure devices were placed and an 8 French sheath replaced into the femoral artery.  The patient was heparinized systemically and ACT verified > 250 seconds.     A 0.014 guidewire was advanced through the 8 [place] sheath.  A Shockwave intravascular lithotripsy catheter and balloon are advanced over the guidewire and positioned in the right common iliac artery to pretreat severe calcification noted in the proximal right common iliac artery.  Intravascular lithotripsy is performed and the shockwave catheter removed.  The right common femoral artery and iliac artery are subsequently dilated over a Lunderquist guidewire with increasing sizes to a 16 [place] dilator.  This is performed without complication but notable for severe resistance at the level of the severe calcification in the proximal right common iliac artery.  Attempts to place a 14 Fr transfemoral E-sheath are unsuccessful due to unacceptable resistance related to the stenosis in the right common iliac artery.  A decision is made to abort plans for transfemoral access and convert to trans-subclavian access.   TRANS-SUBCLAVIAN ACCESS:   General endotracheal anesthesia is induced uneventfully under the care and direction of Dr. Noreene Larsson.  A transesophageal echocardiogram probe is placed for monitoring during valve deployment.    A small oblique incision is made in the left deltopectoral groove.  The incision is completed through the subcutaneous tissues with electrocautery.  The pectoralis muscle fibers are split longitudinally and the deep pectoralis fascia  incised.  Sharp dissection is utilized to dissect beneath the pectoralis fascia.  The insertion  of the pectoralis minor muscle was retracted laterally.  The left axillary artery is identified and encircled with an elastic tape.  A pair of Abbott Perclose percutaneous closure devices were placed and a 6 French sheath replaced into the axillary artery.  The patient was heparinized systemically and ACT verified > 250 seconds.  The left axillary artery is cannulated using a Seldinger technique and a guidewire advanced into the aortic root using fluoroscopy.  An 8 French sheath is passed over the guidewire into the axillary artery.  An AL-1 catheter was used to direct a straight-tip exchange length wire across the native aortic valve into the left ventricle. This was exchanged out for a pigtail catheter and position was confirmed in the LV apex. Simultaneous LV and Ao pressures were recorded.  The pigtail catheter was exchanged for an Amplatz Extra-stiff wire in the LV apex.  A 14 Fr transfemoral E-sheath was introduced into the left axillary artery after progressively dilating over the Amplatz Extra-stiff wire. Echocardiography was utilized to confirm appropriate wire position and no sign of entanglement in the mitral subvalvular apparatus.   TRANSCATHETER HEART VALVE DEPLOYMENT:   An Edwards Sapien 3 Ultra transcatheter heart valve (size 26 mm, model #9750TFX, serial #[id number]) was prepared and crimped per manufacturer's guidelines, and the proper orientation of the valve is confirmed on the [employer] delivery system. The valve was advanced through the introducer sheath using normal technique until in an appropriate position in the aorta beyond the sheath tip. The balloon was then retracted and using the fine-tuning wheel was centered on the valve. The valve was then advanced across the aortic valve annulus. The Commander catheter was retracted using normal technique. Once final position of the valve  has been confirmed by angiographic assessment, the valve is deployed while temporarily holding ventilation and during rapid ventricular pacing to maintain systolic blood pressure < 50 mmHg and pulse pressure < 10 mmHg. The balloon inflation is held for >3 seconds after reaching full deployment volume.  The balloon ruptured at the very end of deployment, presumably due to severe calcification located at the sinotubular junction.  Once the balloon has fully deflated the balloon is retracted into the ascending aorta and valve function is assessed using echocardiography. There is felt to be no paravalvular leak and no central aortic insufficiency.  The patient's hemodynamic recovery following valve deployment is good.  The deployment balloon and guidewire are both removed.    PROCEDURE COMPLETION:   Attempts to pull the ruptured balloon back into the E sheath were unsuccessful.  The E sheath was subsequently pulled out of the left axillary artery.  This requires considerable force and results in dislodgment of the pursestring sutures.  Proximal and distal control of the left axillary artery is obtained with angled DeBakey clamps.  The pursestring suture is removed and the aortotomy incision debrided.  The aortotomy incision is closed in a transverse fashion with interrupted horizontal mattress pledgeted 5-0 Prolene suture.  After completion of the repair there is a palpable pulse in the distal left axillary artery.  Digital subtraction angiography of the left subclavian artery and axillary artery is performed.  There is no sign of significant vascular injury.  There is mild narrowing at the site of repair of the axillary artery with normal flow in the distal vessel.  The right femoral sheath is now pulled back and digital subtraction angiography of the distal aorta and right common iliac artery is performed.  There is no sign of significant vascular  injury.  The right femoral sheath is removed and Perclose  sutures secured.  Protamine was administered and manual pressure held on the right groin.  The small incision in the left deltopectoral groove was closed in multiple layers and the skin closed with subcuticular skin closure.  The temporary pacemaker, pigtail catheters and femoral sheaths were removed with manual pressure used for hemostasis.   The patient tolerated the procedure well and is transported to the surgical intensive care in stable condition. There were no immediate intraoperative complications. All sponge instrument and needle counts are verified correct at completion of the operation.   No blood products were administered during the operation.    Purcell Nails, MD 01/18/2020 3:47 PM

## 2020-01-18 NOTE — Progress Notes (Signed)
  HEART AND VASCULAR CENTER   MULTIDISCIPLINARY HEART VALVE TEAM  Patient doing well s/p TAVR. He is hemodynamically stable. Groin site and subclavian stable subclavian site with mild hematoma. Holding pressure. ECG pending. He is being transfused. Will plan to check a H/H 2 hours after. Plan for early ambulation after bedrest completed and hopeful discharge over the next 48 hours.   Cline Crock PA-C  [facility]  Pager [phone]

## 2020-01-18 NOTE — Transfer of Care (Addendum)
Immediate Anesthesia Transfer of Care Note  Patient: Garrett Hughes  Procedure(s) Performed: Intraoperative Transthoracic Echocardiogram (N/A Chest) Intraoperative Transesophageal Echocardiogram Transcatheter Aortic Valve Replacement (subclavian approach) (Left Chest) Aortogram LOWER EXTREMITY ANGIOGRAM with intravascular shockwave lithotripsy (Bilateral )  Patient Location: SICU  Anesthesia Type:General  Level of Consciousness: awake, alert  and oriented  Airway & Oxygen Therapy: Patient Spontanous Breathing and Patient connected to face mask oxygen  Post-op Assessment: Report given to RN, Post -op Vital signs reviewed and stable and Patient moving all extremities  Post vital signs: Reviewed and stable  Last Vitals:  Vitals Value Taken Time  BP    Temp    Pulse    Resp    SpO2      Last Pain:  Vitals:   01/18/20 0922  TempSrc:   PainSc: 0-No pain         Complications: No apparent anesthesia complications

## 2020-01-18 NOTE — Op Note (Signed)
HEART AND VASCULAR CENTER   MULTIDISCIPLINARY HEART VALVE TEAM   TAVR OPERATIVE NOTE  Date of Procedure:  01/18/2020  Preoperative Diagnosis: Severe Aortic Stenosis   Postoperative Diagnosis: Same   Procedure:    Transcatheter Aortic Valve Replacement - Attempted Percutaneous Transfemoral Approach converted to Subclavian Approach  Edwards Sapien 3 THV (size 26 mm, model # 9750TFX, serial # [id number])  Shockwave Balloon Angioplasty right common femoral artery   Co-Surgeons:  Valentina Gu. Roxy Manns, MD, MD and Sherren Mocha, MD  Anesthesiologist:  Roberts Gaudy, MD  Echocardiographer:  Sanda Klein, MD  Pre-operative Echo Findings:  Severe aortic stenosis  Normal left ventricular systolic function  Post-operative Echo Findings:  No paravalvular leak  Normal left ventricular systolic function  BRIEF CLINICAL NOTE AND INDICATIONS FOR SURGERY  79 year old male with aortic stenosis, CAD status post CABG, recent PCI of the right coronary artery, and longstanding persistent atrial fibrillation, presents today for TAVR in light of severe, stage D1 symptomatic aortic stenosis.  He continues to struggle with New York Heart Association functional class III symptoms with progressive dyspnea requiring increasing doses of loop diuretics and elevated BNP, consistent with acute on chronic diastolic heart failure.  During the course of the patient's preoperative work up they have been evaluated comprehensively by a multidisciplinary team of specialists coordinated through the [facility] the [facility].  They have been demonstrated to suffer from symptomatic severe aortic stenosis as noted above. The patient has been counseled extensively as to the relative risks and benefits of all options for the treatment of severe aortic stenosis including long term medical therapy, conventional surgery for aortic valve replacement, and transcatheter  aortic valve replacement.  The patient has been independently evaluated in formal cardiac surgical consultation by Dr Roxy Manns who deemed the patient appropriate for TAVR. Based upon review of all of the patient's preoperative diagnostic tests they are felt to be candidate for transcatheter aortic valve replacement using the transfemoral approach as an alternative to conventional surgery.  With borderline transfemoral access, we planned on the possibility of left subclavian access if transfemoral access is unsuccessful.  Following the decision to proceed with transcatheter aortic valve replacement, a discussion has been held regarding what types of management strategies would be attempted intraoperatively in the event of life-threatening complications, including whether or not the patient would be considered a candidate for the use of cardiopulmonary bypass and/or conversion to open sternotomy for attempted surgical intervention.  The patient has been advised of a variety of complications that might develop peculiar to this approach including but not limited to risks of death, stroke, paravalvular leak, aortic dissection or other major vascular complications, aortic annulus rupture, device embolization, cardiac rupture or perforation, acute myocardial infarction, arrhythmia, heart block or bradycardia requiring permanent pacemaker placement, congestive heart failure, respiratory failure, renal failure, pneumonia, infection, other late complications related to structural valve deterioration or migration, or other complications that might ultimately cause a temporary or permanent loss of functional independence or other long term morbidity.  The patient provides full informed consent for the procedure as described and all questions were answered preoperatively.  DETAILS OF THE OPERATIVE PROCEDURE  PREPARATION:   The patient is brought to the operating room on the above mentioned date and central monitoring was  established by the anesthesia team including placement of a central venous catheter and radial arterial line. The patient is placed in the supine position on the operating table.  Intravenous antibiotics are administered.  The patient is monitored closely throughout the procedure under conscious sedation.  Baseline transthoracic echocardiogram is performed. The patient's chest, abdomen, both groins, and both lower extremities are prepared and draped in a sterile manner. A time out procedure is performed.  PERIPHERAL ACCESS:   Using ultrasound guidance, femoral arterial and venous access is obtained with placement of 6 Fr sheaths on the left side.  A pigtail diagnostic catheter was passed through the femoral arterial sheath under fluoroscopic guidance into the aortic root.  A temporary transvenous pacemaker catheter was passed through the femoral venous sheath under fluoroscopic guidance into the right ventricle.  The pacemaker was tested to ensure stable lead placement and pacemaker capture. Aortic root angiography was performed in order to determine the optimal angiographic angle for valve deployment.  TRANSFEMORAL ACCESS:  A micropuncture technique is used to access the right femoral artery under fluoroscopic and ultrasound guidance.  2 Perclose devices are deployed at 10' and 2' positions to 'PreClose' the femoral artery. An 8 French sheath is placed over a versacore wire and a 0.014 inch wire is inserted into the thoracic aorta.  A 7 mm shockwave balloon is advanced over the wire into the common iliac artery where there is known severe calcific disease by CTA.  Multiple pulses are delivered per protocol with 30 pulses per treatment cycle delivered over a total of 6 cycles. Appears well expanded at 4 atm for each of these cycles.  The wire is changed out over an endhole catheter for an Amplatz superstiff wire.  A 14 French Edwards E sheath dilator is advanced with a great amount of difficulty through the  iliac artery.  Initially, the dilator would not advance over the superstiff wire.  This is changed out for a Lunderquist wire.  The dilator then advanced with success.  A 16 French dilator is then used, also with significant resistance.  A 14 French E sheath is attempted but would not advance beyond the area of concern in the iliac artery.  Out of concern for traumatizing the iliac artery, attempts at femoral access were aborted and attention was turned to the left subclavian area.  The sheath is pulled back to the iliac bifurcation and left in place for the remainder of the case.  BALLOON AORTIC VALVULOPLASTY:  Not performed  LEFT SUBCLAVIAN ACCESS/VALVE DEPLOYMENT/SUBCLAVIAN REPAIR Please see the complete note of Dr Roxy Manns  PROCEDURE COMPLETION:  The indwelling femoral sheath was pulled back further and DSA angiography was performed via power injection through the contralateral pigtail catheter in the distal abdominal aorta. This demonstrated no evidence of dissection and flow into the CFA was intact. The sheath was removed and femoral artery closure is performed using the 2 previously deployed Perclose devices.  Protamine is administered once femoral arterial repair was complete. The site is clear with no evidence of bleeding or hematoma after the sutures are tightened. The temporary pacemaker and pigtail catheters are removed. Manual pressure is used for contralateral femoral arterial hemostasis for the 6 Fr sheath.  The patient tolerated the procedure well and is transported to the surgical intensive care in stable condition. There were no immediate intraoperative complications. All sponge instrument and needle counts are verified correct at completion of the operation.   The patient received a total of 59.4 mL of intravenous contrast during the procedure.   Sherren Mocha, MD 01/18/2020 7:19 PM

## 2020-01-18 NOTE — Telephone Encounter (Signed)
entered in error   

## 2020-01-18 NOTE — OR Nursing (Signed)
In and out cath at end of procedure. Inserted and removed without difficulty. drained 250 cc of dull yellow urine  Charlsie Merles RN

## 2020-01-18 NOTE — Progress Notes (Signed)
  Echocardiogram Echocardiogram Transesophageal has been performed.  Garrett Hughes 01/18/2020, 3:05 PM

## 2020-01-19 ENCOUNTER — Inpatient Hospital Stay (HOSPITAL_COMMUNITY): Payer: Medicare Other

## 2020-01-19 ENCOUNTER — Ambulatory Visit: Payer: Medicare Other | Admitting: Osteopathic Medicine

## 2020-01-19 ENCOUNTER — Encounter: Payer: Self-pay | Admitting: *Deleted

## 2020-01-19 DIAGNOSIS — Z952 Presence of prosthetic heart valve: Secondary | ICD-10-CM

## 2020-01-19 DIAGNOSIS — Z954 Presence of other heart-valve replacement: Secondary | ICD-10-CM

## 2020-01-19 DIAGNOSIS — I350 Nonrheumatic aortic (valve) stenosis: Secondary | ICD-10-CM

## 2020-01-19 LAB — TYPE AND SCREEN
ABO/RH(D): A POS
Antibody Screen: NEGATIVE
Unit division: 0
Unit division: 0
Unit division: 0

## 2020-01-19 LAB — BPAM RBC
Blood Product Expiration Date: 202105242359
Blood Product Expiration Date: 202106022359
Blood Product Expiration Date: 202106032359
ISSUE DATE / TIME: 202105111447
ISSUE DATE / TIME: 202105111447
Unit Type and Rh: 6200
Unit Type and Rh: 6200
Unit Type and Rh: 6200

## 2020-01-19 LAB — CBC
HCT: 30.9 % — ABNORMAL LOW (ref 39.0–52.0)
Hemoglobin: 10.3 g/dL — ABNORMAL LOW (ref 13.0–17.0)
MCH: 31.5 pg (ref 26.0–34.0)
MCHC: 33.3 g/dL (ref 30.0–36.0)
MCV: 94.5 fL (ref 80.0–100.0)
Platelets: 189 10*3/uL (ref 150–400)
RBC: 3.27 MIL/uL — ABNORMAL LOW (ref 4.22–5.81)
RDW: 17.2 % — ABNORMAL HIGH (ref 11.5–15.5)
WBC: 18.4 10*3/uL — ABNORMAL HIGH (ref 4.0–10.5)
nRBC: 0 % (ref 0.0–0.2)

## 2020-01-19 LAB — BASIC METABOLIC PANEL
Anion gap: 13 (ref 5–15)
BUN: 17 mg/dL (ref 8–23)
CO2: 20 mmol/L — ABNORMAL LOW (ref 22–32)
Calcium: 8.2 mg/dL — ABNORMAL LOW (ref 8.9–10.3)
Chloride: 101 mmol/L (ref 98–111)
Creatinine, Ser: 1.13 mg/dL (ref 0.61–1.24)
GFR calc Af Amer: >60 mL/min (ref >60)
GFR calc non Af Amer: >60 mL/min (ref >60)
Glucose, Bld: 226 mg/dL — ABNORMAL HIGH (ref 70–99)
Potassium: 3.9 mmol/L (ref 3.5–5.1)
Sodium: 134 mmol/L — ABNORMAL LOW (ref 135–145)

## 2020-01-19 LAB — GLUCOSE, CAPILLARY
Glucose-Capillary: 233 mg/dL — ABNORMAL HIGH (ref 70–99)
Glucose-Capillary: 215 mg/dL — ABNORMAL HIGH (ref 70–99)
Glucose-Capillary: 245 mg/dL — ABNORMAL HIGH (ref 70–99)
Glucose-Capillary: 183 mg/dL — ABNORMAL HIGH (ref 70–99)

## 2020-01-19 LAB — ECHOCARDIOGRAM LIMITED
Height: 64 in
Weight: 2240 oz

## 2020-01-19 LAB — MAGNESIUM: Magnesium: 1.1 mg/dL — ABNORMAL LOW (ref 1.7–2.4)

## 2020-01-19 MED ORDER — FUROSEMIDE 10 MG/ML IJ SOLN
40.0000 mg | Freq: Once | INTRAMUSCULAR | Status: AC
  Administered 2020-01-19: 40 mg via INTRAVENOUS
  Filled 2020-01-19: qty 4

## 2020-01-19 MED ORDER — INSULIN ASPART 100 UNIT/ML ~~LOC~~ SOLN
5.0000 [IU] | Freq: Three times a day (TID) | SUBCUTANEOUS | Status: DC
  Administered 2020-01-19 – 2020-01-28 (×9): 5 [IU] via SUBCUTANEOUS

## 2020-01-19 MED ORDER — POTASSIUM CHLORIDE CRYS ER 20 MEQ PO TBCR
40.0000 meq | EXTENDED_RELEASE_TABLET | Freq: Once | ORAL | Status: AC
  Administered 2020-01-19: 40 meq via ORAL
  Filled 2020-01-19: qty 2

## 2020-01-19 MED ORDER — POTASSIUM CHLORIDE CRYS ER 20 MEQ PO TBCR
20.0000 meq | EXTENDED_RELEASE_TABLET | Freq: Once | ORAL | Status: AC
  Administered 2020-01-19: 20 meq via ORAL
  Filled 2020-01-19: qty 1

## 2020-01-19 MED ORDER — MAGNESIUM SULFATE 4 GM/100ML IV SOLN
4.0000 g | Freq: Once | INTRAVENOUS | Status: AC
  Administered 2020-01-19: 4 g via INTRAVENOUS
  Filled 2020-01-19: qty 100

## 2020-01-19 MED ORDER — INSULIN GLARGINE 100 UNIT/ML ~~LOC~~ SOLN
5.0000 [IU] | Freq: Every day | SUBCUTANEOUS | Status: DC
  Administered 2020-01-19 – 2020-01-28 (×10): 5 [IU] via SUBCUTANEOUS
  Filled 2020-01-19 (×10): qty 0.05

## 2020-01-19 MED ORDER — LISINOPRIL 5 MG PO TABS
5.0000 mg | ORAL_TABLET | Freq: Every day | ORAL | Status: DC
  Administered 2020-01-19 – 2020-01-28 (×9): 5 mg via ORAL
  Filled 2020-01-19 (×10): qty 1

## 2020-01-19 NOTE — Progress Notes (Signed)
   Post-op Echo showed signs of RV pressure overload. Ordered another dose of IV Lasix 40mg  with 20 mEq of K-Dur at the request of , PA-C with Structural Heart Team.  Carlean Jews, PA-C 01/19/2020 5:15 PM

## 2020-01-19 NOTE — Progress Notes (Signed)
Chaplain engaged in initial visit with Garrett Hughes, his wife and daughter.  Daughter stated that [name] has been feeling "restless" because of his back pain and has been trying to feel comfortable in the bed.  Sipriano expressed that the heating pads and moving around the bed have helped.  Chaplain offered presence and listening and affirmed the pain and journey he is on.   Daughter also expressed that her dad taught her and her brother about work ethic and being good people.  They have been able to learn a lot from him about not giving up.  Chaplain affirmed the love and support he has around him.  Mrs. Pavlock also noted that there are a lot of people praying for him.   Chaplain will continue to follow-up.

## 2020-01-19 NOTE — Progress Notes (Addendum)
[facility]   MULTIDISCIPLINARY HEART VALVE TEAM  Patient Name: Garrett Hughes Date of Encounter: 01/19/2020  Primary Cardiologist: Dr. Jens Som / Dr. Excell Seltzer & Dr. Cornelius Moras (TAVR)  Hospital Problem List     Principal Problem:   S/P TAVR (transcatheter aortic valve replacement) Active Problems:   Benign essential hypertension   History of Barrett's esophagus   Hx of CABG   Hyperlipemia   Low back pain   Type 2 diabetes mellitus with kidney complication, without long-term current use of insulin (HCC)   GERD (gastroesophageal reflux disease)   Chronic atrial fibrillation (HCC)   CAD (coronary artery disease)   Acute on chronic diastolic CHF (congestive heart failure) (HCC)   Severe aortic stenosis   Carotid artery disease (HCC)   COPD (chronic obstructive pulmonary disease) (HCC)     Subjective   No complaints. Slept okay last night. No orthpnea  Inpatient Medications    Scheduled Meds:  aspirin  81 mg Oral Daily   Chlorhexidine Gluconate Cloth  6 each Topical Daily   clopidogrel  75 mg Oral Daily   ferrous sulfate  325 mg Oral Q breakfast   insulin aspart  0-24 Units Subcutaneous TID AC & HS   pantoprazole  20 mg Oral Daily   pravastatin  40 mg Oral Daily   sodium chloride flush  3 mL Intravenous Q12H   Continuous Infusions:  sodium chloride     sodium chloride Stopped (01/18/20 1700)   cefUROXime (ZINACEF)  IV Stopped (01/18/20 2320)   nitroGLYCERIN Stopped (01/19/20 0659)   phenylephrine (NEO-SYNEPHRINE) Adult infusion Stopped (01/18/20 1730)   PRN Meds: sodium chloride, acetaminophen **OR** acetaminophen, morphine injection, ondansetron (ZOFRAN) IV, oxyCODONE, sodium chloride flush, traMADol   Vital Signs    Vitals:   01/19/20 0908 01/19/20 0913 01/19/20 0918 01/19/20 0923  BP:      Pulse: (!) 0 (!) 0 (!) 0 (!) 0  Resp:      Temp:      TempSrc:      SpO2:      Weight:      Height:        Intake/Output Summary (Last 24 hours) at 01/19/2020  0950 Last data filed at 01/19/2020 0700 Gross per 24 hour  Intake 3672.05 ml  Output 1275 ml  Net 2397.05 ml   Filed Weights   01/18/20 0904  Weight: 63.5 kg    Physical Exam   GEN: chronically ill appearing HEENT: Grossly normal.  Neck: Supple, no JVD, carotid bruits, or masses. Cardiac: irreg irreg, soft flow murmur. No rubs, or gallops. No clubbing, cyanosis, edema.   Respiratory:  Mild crackles at bases  GI: Soft, nontender, nondistended, BS + x 4. MS: no deformity or atrophy. Skin: warm and dry, no rash. Subclavian site healing well with mild ecchymosis  Neuro:  Strength and sensation are intact. Psych: AAOx3.  Normal affect.  Labs    CBC Recent Labs    01/18/20 2001 01/19/20 0453  WBC  --  18.4*  HGB 9.9* 10.3*  HCT 29.7* 30.9*  MCV  --  94.5  PLT  --  189   Basic Metabolic Panel Recent Labs    29/79/89 1425 01/18/20 1425 01/18/20 1429 01/19/20 0453  NA 139   < > 140 134*  K 3.6   < > 3.5 3.9  CL 100  --   --  101  CO2  --   --   --  20*  GLUCOSE 162*  --   --  226*  BUN 15  --   --  17  CREATININE 0.60*  --   --  1.13  CALCIUM  --   --   --  8.2*  MG  --   --   --  1.1*   < > = values in this interval not displayed.   Liver Function Tests No results for input(s): AST, ALT, ALKPHOS, BILITOT, PROT, ALBUMIN in the last 72 hours. No results for input(s): LIPASE, AMYLASE in the last 72 hours. Cardiac Enzymes No results for input(s): CKTOTAL, CKMB, CKMBINDEX, TROPONINI in the last 72 hours. BNP Invalid input(s): POCBNP D-Dimer No results for input(s): DDIMER in the last 72 hours. Hemoglobin A1C No results for input(s): HGBA1C in the last 72 hours. Fasting Lipid Panel No results for input(s): CHOL, HDL, LDLCALC, TRIG, CHOLHDL, LDLDIRECT in the last 72 hours. Thyroid Function Tests No results for input(s): TSH, T4TOTAL, T3FREE, THYROIDAB in the last 72 hours.  Invalid input(s): FREET3  Telemetry    afib with PVCs - Personally Reviewed  ECG     Afib with slow VR HR 50bpm, RBBB, LAFB, - Personally Reviewed  Radiology    DG Chest Port 1 View  Result Date: 01/18/2020 CLINICAL DATA:  TAVR EXAM: PORTABLE CHEST 1 VIEW COMPARISON:  01/13/2020 FINDINGS: TAVR in satisfactory position. Cardiac enlargement with mild vascular congestion. Prominent interstitial markings may be due to mild interstitial edema. No pleural effusion. No pneumothorax. Lungs are well aerated. IMPRESSION: Postop TAVR.  Persistent vascular congestion and interstitial edema. Electronically Signed   By: Marlan Palau M.D.   On: 01/18/2020 15:45   ECHO TEE  Result Date: 01/18/2020    TRANSESOPHOGEAL ECHO REPORT   Patient Name:   Garrett Hughes [facility]  Date of Exam: 01/18/2020 Medical Rec #:  [id number]      Height:       64.0 in Accession #:    [id number]     Weight:       140.0 lb Date of Birth:  01-26-1941      BSA:          1.681 m Patient Age:    79 years       BP:           192/73 mmHg Patient Gender: M              HR:           73 bpm. Exam Location:  Anesthesiology Procedure: TEE-Intraopertive, Cardiac Doppler and Color Doppler Indications:     Aortic stenosis  History:         Patient has prior history of Echocardiogram examinations, most                  recent 12/26/2019. CAD, Prior CABG, Aortic Valve Disease and                  Mitral Valve Disease; Risk Factors:Hypertension, Diabetes and                  Dyslipidemia.                  Aortic Valve: 26 mm Edwards Medtronic, stented (TAVR) valve is                  present in the aortic position. Procedure Date: 01/18/2020.  Sonographer:     Lavenia Atlas RDCS Referring Phys:  [phone] MICHAEL COOPER Diagnosing Phys: Thurmon Fair MD PROCEDURE: The transesophogeal probe was passed  without difficulty through the esophogus of the patient. Sedation performed by different physician. The patient was monitored while under deep sedation. The patient developed no complications during the procedure. PRE-PROCEDURAL FINDINGS: Normal left  ventricular systolic function. Estimated LVEF 55-60%. There are no regional wall motion abnormalities. Severe calcific aortic stenosis. Trileaflet aortic valve. Peak aortic valve gradient 60 mm Hg, mean gradient 31 mm Hg. Dimensionless obstructive index 0.15, calculated aortic valve area is 0.8 cm. Mild aortic insufficiency. Moderate mitral insufficiency. Small posterior pericardial effusion. POST-PROCEDURAL FINDINGS: Hyperdynamic left ventricular systolic function. Estimated LVEF 70%. There are no regional wall motion abnormalities. Well-seated TAVR stent-valve. Peak aortic valve gradient 5 mm Hg, mean gradient 2 mm Hg. Dimensionless obstructive index 0.43, calculated aortic valve area is 1.43 cm. There is no aortic insufficiency and no perivalvular leak. Mild mitral insufficiency, substantially improved. Small posterior pericardial effusion. IMPRESSIONS  1. Left ventricular ejection fraction, by estimation, is 50 to 55%. The left ventricle has low normal function. The left ventricle has no regional wall motion abnormalities. There is moderate concentric left ventricular hypertrophy.  2. Right ventricular systolic function is normal. The right ventricular size is normal.  3. Left atrial size was severely dilated. No left atrial/left atrial appendage thrombus was detected.  4. Right atrial size was moderately dilated.  5. The mitral valve is grossly normal. Moderate mitral valve regurgitation.  6. The aortic valve is tricuspid. Aortic valve regurgitation is mild. Severe aortic valve stenosis. There is a 26 mm Edwards Medtronic, stented (TAVR) valve present in the aortic position. Procedure Date: 01/18/2020.  7. There is mild (Grade II) atheroma plaque. FINDINGS  Left Ventricle: Left ventricular ejection fraction, by estimation, is 50 to 55%. The left ventricle has low normal function. The left ventricle has no regional wall motion abnormalities. The left ventricular internal cavity size was normal in size. There  is moderate concentric left ventricular hypertrophy. Right Ventricle: The right ventricular size is normal. No increase in right ventricular wall thickness. Right ventricular systolic function is normal. Left Atrium: Left atrial size was severely dilated. No left atrial/left atrial appendage thrombus was detected. Right Atrium: Right atrial size was moderately dilated. Pericardium: A small pericardial effusion is present. The pericardial effusion is posterior to the left ventricle. Mitral Valve: The mitral valve is grossly normal. Mild mitral annular calcification. Moderate mitral valve regurgitation, with centrally-directed jet. Tricuspid Valve: The tricuspid valve is normal in structure. Tricuspid valve regurgitation is mild. Aortic Valve: The aortic valve is tricuspid. . There is severe thickening and severe calcifcation of the aortic valve. Aortic valve regurgitation is mild. Severe aortic stenosis is present. There is severe thickening of the aortic valve. There is severe calcifcation of the aortic valve. Aortic valve mean gradient measures 2.0 mmHg. Aortic valve peak gradient measures 4.8 mmHg. Aortic valve area, by VTI measures 1.43 cm. There is a 26 mm Edwards Medtronic, stented (TAVR) valve present in the aortic position. Procedure Date: 01/18/2020. Pulmonic Valve: The pulmonic valve was grossly normal. Pulmonic valve regurgitation is not visualized. Aorta: The aortic root, ascending aorta, aortic arch and descending aorta are all structurally normal, with no evidence of dilitation or obstruction. There is mild (Grade II) atheroma plaque. IAS/Shunts: No atrial level shunt detected by color flow Doppler. Additional Comments: A pacer wire is visualized.  LEFT VENTRICLE PLAX 2D LVOT diam:     2.06 cm LV SV:         29 LV SV Index:   17 LVOT Area:  3.33 cm  AORTIC VALVE AV Area (Vmax):    1.72 cm AV Area (Vmean):   1.88 cm AV Area (VTI):     1.43 cm AV Vmax:           109.77 cm/s AV Vmean:           65.193 cm/s AV VTI:            0.202 m AV Peak Grad:      4.8 mmHg AV Mean Grad:      2.0 mmHg LVOT Vmax:         56.50 cm/s LVOT Vmean:        36.749 cm/s LVOT VTI:          0.086 m LVOT/AV VTI ratio: 0.43 TRICUSPID VALVE TV Peak grad:   47.6 mmHg TV Vmax:        3.45 m/s  SHUNTS Systemic VTI:  0.09 m Systemic Diam: 2.06 cm Dani Gobble Croitoru MD Electronically signed by Sanda Klein MD Signature Date/Time: 01/18/2020/4:28:04 PM    Final     Cardiac Studies   TAVR OPERATIVE NOTE     Date of Procedure:                01/18/2020   Preoperative Diagnosis:      Severe Aortic Stenosis    Postoperative Diagnosis:    Same    Procedure:        Transcatheter Aortic Valve Replacement - Left Trans-Subclavian Approach             Edwards Sapien 3 UltraTHV (size 26 mm, model # 9750TFX, serial # [id number])              Co-Surgeons:                        Valentina Gu. Roxy Manns, MD and Sherren Mocha, MD   Anesthesiologist:                  Roberts Gaudy, MD   Echocardiographer:              Sanda Klein, MD   Pre-operative Echo Findings: Severe aortic stenosis Normal left ventricular systolic function   Post-operative Echo Findings: No paravalvular leak Normal left ventricular systolic function   _______________   Echo 01/19/20: complete but pending formal read at the time of discharge    Patient Profile     Garrett Hughes is a 79 y.o. male with a history of CAD s/p CABG 1997 and recent PCI of RCA (12/24/19), RBBB, chronic atrial fibrillation (pt declines anticoagulation), high grade carotid artery disease, HTN, DMT2, HLD, chronic appearing anemia/thrombocytopenia, GERD, Barrett's esophagus, h/o GIB and urinary tract bleeding and severe AS who presented to [facility] on 01/18/20 for planned TAVR.  Assessment & Plan    Severe AS: s/p successful TAVR with a 26 mm Edwards Sapien 3 THV via the left subclavian approach on 01/18/20. Operative course complicated by balloon rupture during valve deployment and  axillary artery damage, this was successfully repaired and there was no significant vascular injury. Post operative echo completed by pending formal read. Groin site/subclavian site are stable. ECG with afib and old RBBB/LAFB. There is no high grade heart block. Continue Asprin and plavix. Transfer to telemetry. Hopeful discharge home tomorrow.  Acute on chronic anemia: he was transfused 1U PRBCS. Hg stable at 9.9.  Acute on chronic diastolic CHF: as evidenced by an elevated BNP on pre admission lab work and  mild CHF on CXR. This has been treated with TAVR. Will treat him with one dose of IV lasix now and then resume home lasix 20 mg daily tomorrow.  CAD: s/p complex atherectomy and PCI on complicated by dissection/contained perforation treated with overlapping DES and a Papyrus covered stent on 12/24/19. No recurrent angina. Continue ASA and plavix (long term).  Chronic afib: not on oral anticoagulation due to history of GI bleeding and patient preference. Rate well controlled  DMT2: continue SSI   HTN: BP evelated. Resume home lisinopril. IV lasix as above.   Carotid artery disease: 80-99% RICA stenosis. Followed by Dr. Myra GianottiBrabham ( possible TCAR candidate).   Signed, Cline CrockKathryn Thompson, PA-C  01/19/2020, 9:50 AM  Pager [phone]   I have seen and examined the patient and agree with the assessment and plan as outlined.  Purcell Nailslarence H Bascom Biel, MD 01/19/2020 3:38 PM

## 2020-01-19 NOTE — Progress Notes (Signed)
Inpatient Diabetes Program Recommendations  AACE/ADA: New Consensus Statement on Inpatient Glycemic Control   Target Ranges:  Prepandial:   less than 140 mg/dL      Peak postprandial:   less than 180 mg/dL (1-2 hours)      Critically ill patients:  140 - 180 mg/dL   Results for Garrett Hughes, Garrett Hughes (MRN [id number]) as of 01/19/2020 12:41  Ref. Range 01/13/2020 07:59 01/18/2020 09:08 01/18/2020 17:28 01/18/2020 19:59 01/18/2020 21:33 01/19/2020 06:30 01/19/2020 08:11 01/19/2020 11:39  Glucose-Capillary Latest Ref Range: 70 - 99 mg/dL 156 (H) 153 (H) 794 (H) 170 (H) 162 (H) 233 (H) 215 (H) 245 (H)   Review of Glycemic Control  Diabetes history: DM2 Outpatient Diabetes medications: Glipizide 5 mg BID, Metformin 1000 mg BID Current orders for Inpatient glycemic control: Novolog 0-24 units AC&HS  Inpatient Diabetes Program Recommendations:    Insulin-Please consider ordering Lantus 5 units Q24H and Novolog 3 units TID with meals for meal coverage if patient eats at least 50% of meals.  Thanks, Orlando Penner, RN, MSN, CDE Diabetes Coordinator Inpatient Diabetes Program [phone] (Team Pager from 8am to 5pm)

## 2020-01-19 NOTE — Progress Notes (Signed)
  Echocardiogram 2D Echocardiogram has been performed.  Garrett Hughes 01/19/2020, 9:38 AM

## 2020-01-20 ENCOUNTER — Encounter: Payer: Self-pay | Admitting: Anesthesiology

## 2020-01-20 ENCOUNTER — Inpatient Hospital Stay (HOSPITAL_COMMUNITY): Payer: Medicare Other

## 2020-01-20 DIAGNOSIS — I5033 Acute on chronic diastolic (congestive) heart failure: Secondary | ICD-10-CM

## 2020-01-20 DIAGNOSIS — Z952 Presence of prosthetic heart valve: Secondary | ICD-10-CM

## 2020-01-20 LAB — GLUCOSE, CAPILLARY
Glucose-Capillary: 152 mg/dL — ABNORMAL HIGH (ref 70–99)
Glucose-Capillary: 367 mg/dL — ABNORMAL HIGH (ref 70–99)
Glucose-Capillary: 55 mg/dL — ABNORMAL LOW (ref 70–99)
Glucose-Capillary: 64 mg/dL — ABNORMAL LOW (ref 70–99)
Glucose-Capillary: 140 mg/dL — ABNORMAL HIGH (ref 70–99)
Glucose-Capillary: 179 mg/dL — ABNORMAL HIGH (ref 70–99)

## 2020-01-20 LAB — BASIC METABOLIC PANEL
Anion gap: 13 (ref 5–15)
BUN: 21 mg/dL (ref 8–23)
CO2: 19 mmol/L — ABNORMAL LOW (ref 22–32)
Calcium: 8.3 mg/dL — ABNORMAL LOW (ref 8.9–10.3)
Chloride: 103 mmol/L (ref 98–111)
Creatinine, Ser: 1.22 mg/dL (ref 0.61–1.24)
GFR calc Af Amer: >60 mL/min (ref >60)
GFR calc non Af Amer: 56 mL/min — ABNORMAL LOW (ref >60)
Glucose, Bld: 153 mg/dL — ABNORMAL HIGH (ref 70–99)
Potassium: 4.3 mmol/L (ref 3.5–5.1)
Sodium: 135 mmol/L (ref 135–145)

## 2020-01-20 LAB — CBC
HCT: 30.6 % — ABNORMAL LOW (ref 39.0–52.0)
Hemoglobin: 10.1 g/dL — ABNORMAL LOW (ref 13.0–17.0)
MCH: 31.5 pg (ref 26.0–34.0)
MCHC: 33 g/dL (ref 30.0–36.0)
MCV: 95.3 fL (ref 80.0–100.0)
Platelets: 164 10*3/uL (ref 150–400)
RBC: 3.21 MIL/uL — ABNORMAL LOW (ref 4.22–5.81)
RDW: 16.7 % — ABNORMAL HIGH (ref 11.5–15.5)
WBC: 15.9 10*3/uL — ABNORMAL HIGH (ref 4.0–10.5)
nRBC: 0 % (ref 0.0–0.2)

## 2020-01-20 LAB — MAGNESIUM: Magnesium: 1.9 mg/dL (ref 1.7–2.4)

## 2020-01-20 MED ORDER — FUROSEMIDE 10 MG/ML IJ SOLN
60.0000 mg | Freq: Two times a day (BID) | INTRAMUSCULAR | Status: DC
  Administered 2020-01-20 – 2020-01-23 (×7): 60 mg via INTRAVENOUS
  Filled 2020-01-20 (×7): qty 6

## 2020-01-20 MED FILL — Magnesium Sulfate Inj 50%: INTRAMUSCULAR | Qty: 10 | Status: AC

## 2020-01-20 MED FILL — Heparin Sodium (Porcine) Inj 1000 Unit/ML: INTRAMUSCULAR | Qty: 30 | Status: AC

## 2020-01-20 MED FILL — Potassium Chloride Inj 2 mEq/ML: INTRAVENOUS | Qty: 40 | Status: AC

## 2020-01-20 NOTE — Anesthesia Postprocedure Evaluation (Signed)
Anesthesia Post Note  Patient: Garrett Hughes  Procedure(s) Performed: Intraoperative Transthoracic Echocardiogram (N/A Chest) Intraoperative Transesophageal Echocardiogram Transcatheter Aortic Valve Replacement (subclavian approach) (Left Chest) Aortogram LOWER EXTREMITY ANGIOGRAM with intravascular shockwave lithotripsy (Bilateral )     Patient location during evaluation: PACU Anesthesia Type: General Level of consciousness: awake and alert Pain management: pain level controlled Vital Signs Assessment: post-procedure vital signs reviewed and stable Respiratory status: spontaneous breathing Cardiovascular status: stable Anesthetic complications: no    Last Vitals:  Vitals:   01/20/20 0337 01/20/20 0521  BP: (!) 163/78 (!) 144/56  Pulse: (!) 55   Resp: 18   Temp: 36.5 C   SpO2: 95%     Last Pain:  Vitals:   01/20/20 0337  TempSrc: Oral  PainSc:                  Lewie Loron

## 2020-01-20 NOTE — Discharge Summary (Addendum)
[place] VALVE TEAM  Discharge Summary    Patient ID: Garrett Hughes MRN: [id number]; DOB: 05/28/1941  Admit date: 01/18/2020 Discharge date: 01/28/2020  Primary Care Provider: Emeterio Reeve, DO  Primary Cardiologist: Kirk Ruths, MD / Dr. Burt Knack & Dr. Roxy Manns (TAVR)  Discharge Diagnoses    Principal Problem:   S/P TAVR (transcatheter aortic valve replacement) Active Problems:   Benign essential hypertension   History of Barrett's esophagus   Hx of CABG   Hyperlipemia   Low back pain   Type 2 diabetes mellitus with kidney complication, without long-term current use of insulin (HCC)   GERD (gastroesophageal reflux disease)   Chronic atrial fibrillation (HCC)   CAD (coronary artery disease)   Acute on chronic diastolic CHF (congestive heart failure) (HCC)   Severe aortic stenosis   Carotid artery disease (HCC)   COPD (chronic obstructive pulmonary disease) ([place])   Cerebral embolism with cerebral infarction   Allergies Allergies  Allergen Reactions  . Atorvastatin     Other reaction(s): Myalgias (intolerance)  . Cholestyramine Other (See Comments)    Constipation  . Pravastatin Other (See Comments)    Does not tolerate high doses    Diagnostic Studies/Procedures   TAVR OPERATIVE NOTE   Date of Procedure:01/18/2020  Preoperative Diagnosis:Severe Aortic Stenosis   Postoperative Diagnosis:Same   Procedure:   Transcatheter Aortic Valve Replacement - Left Trans-SubclavianApproach Edwards Sapien 3 UltraTHV (size 66m, model # [id number] serial # [id number]  Co-Surgeons:Clarence H. ORoxy Manns MD and MSherren Mocha MD  Anesthesiologist:David JLinna Caprice MD  Echocardiographer:Mihai Croitoru, MD  Pre-operative Echo Findings: ? Severe aortic stenosis ? Normalleft ventricular systolic  function  Post-operative Echo Findings: ? Noparavalvular leak ? Normalleft ventricular systolic function  _______________   Echo 01/19/20: IMPRESSIONS  1. Left ventricular ejection fraction, by estimation, is 70 to 75%. The left ventricle has hyperdynamic function. The left ventricle has no regional wall motion abnormalities. There is mild concentric left  ventricular hypertrophy. Left ventricular diastolic function could not be evaluated. There is the interventricular septum is flattened in systole, consistent with right ventricular pressure overload.  2. Right ventricular systolic function is mildly reduced. The right  ventricular size is moderately enlarged. There is severely elevated  pulmonary artery systolic pressure. The estimated right ventricular  systolic pressure is 840.9mmHg.  3. The mitral valve is normal in structure. Mild to moderate mitral valve regurgitation.  4. Tricuspid valve regurgitation is moderate to severe.  5. The aortic valve has been repaired/replaced. Aortic valve  regurgitation is not visualized. There is a 26 mm Edwards Sapien  prosthetic (TAVR) valve present in the aortic position. Procedure Date:  01/18/2020. Echo findings are consistent with normal  structure and function of the aortic valve prosthesis. Aortic valve mean  gradient measures 9.3 mmHg. Aortic valve Vmax measures 2.24 m/s.  6. The inferior vena cava is dilated in size with <50% respiratory  variability, suggesting right atrial pressure of 15 mmHg.   Comparison(s): There is marked worsening of pulmonary artery hypertension and severity of tricuspid regurgitation since the intra-procedural study.   ______________________   MRI 01/22/20 IMPRESSION: 1. Numerous small acute infarcts in the right greater than left cerebral hemispheres and left cerebellum compatible with a central embolic source. 2. Suspected occlusion of the right internal carotid  artery.   ________________   CT angio neck 01/23/20 IMPRESSION: Right ICA occlusion at the origin, precise age indeterminate. This may well be chronic.  Aortic atherosclerosis.  Advanced atherosclerotic disease  at the left carotid bifurcation and proximal ICA. Severe stenosis of the proximal ICA with luminal diameter estimated at 1 mm. This would be an 80% or greater stenosis.  Stenotic disease at both vertebral artery origins. 70% stenosis on the right. 50% stenosis on the left.  Dense calcification at the basilar tip consistent with embolized plaque. This was not present on a CT scan 12/28/2019.  No acute intracranial large vessel occlusion. The embolized plaque at the basilar tip does result in stenosis of both P1 origins, but those vessels continue to show flow.   History of Present Illness     Garrett Hughes is a 79 y.o. male with a history of CAD s/p CABG 1997 and recent PCI of RCA (12/24/19), RBBB, chronic atrial fibrillation (pt declines anticoagulation),high grade carotid artery disease,HTN, DMT2, HLD, chronic appearing anemia/thrombocytopenia, GERD, Barrett's esophagus, h/o GIB and urinary tract bleeding and severe AS who presented to [facility] on 01/18/20 for planned TAVR.  Patient's cardiac history dates back to 1997 when he first presented with coronary artery disease. He underwent coronary artery bypass grafting x3 at [facility] in [place]. He has been followed for the last several years by Dr. Stanford Breed with history of aortic stenosis, coronary artery disease, and persistent atrial fibrillation.  He was anticoagulated using warfarin for a period of time but this was stopped because of problems with GI bleeding and hematuria and difficulty maintaining therapeutic levels.   Patient has known history of aortic stenosis that has progressed on serial follow-up echocardiograms. He also has a long history of exertional shortness of breath that has  slowly progressed and become quite problematic over the last few months. Recent echocardiogram performed October 19, 2019 revealed normal left ventricular systolic function with severe aortic stenosis, moderate aortic insufficiency and moderate mitral regurgitation. He was seen in follow-up by Dr. Stanford Breed and referred to the multidisciplinary heart valve clinic.  Diagnostic cardiac catheterization performed by Dr. Burt Knack on November 26, 2019 revealed severe multivessel coronary artery disease with chronic occlusion of the left coronary artery and severe high-grade stenosis of the right coronary artery.  There was continued patency of left internal mammary artery to the left anterior descending coronary artery and greater saphenous vein graft to the obtuse marginal branch.  Right internal mammary artery graft previously placed to the right coronary artery was atretic.  The patient's case was reviewed by multidisciplinary team of specialists and the patient subsequently was brought back to the Cath Lab on December 24, 2019 for PCI and stenting of the right coronary artery. This procedure was complicated by coronary perforation requiring placement of a covered stent. Following the procedure the patient had 2 days of chest pain but serial echocardiograms revealed no significant pericardial effusion. The patient was discharged from the hospital but readmitted December 28, 2019 following a syncopal episode that was felt likely to be related to his aortic stenosis, atrial fibrillation, and dehydration. Medications were adjusted and a ZIO patch monitor placed.   The patient has been evaluated by the multidisciplinary valve team and felt to have severe, symptomatic aortic stenosis and to be a suitable candidate for TAVR, which was set up for 01/18/20.    Hospital Course     Consultants: none    Severe AS:s/p successful TAVR with a46m Edwards Sapien 3 THV via the left subclavianapproach on 01/18/20. Operative course  complicated by balloon rupture during valve deployment and axillary artery damage, this was successfully repaired and there was no significant vascular injury  by flouroscopy.Post operative echoshowed EF 70%, normally functioning TAVR with a mean gradient of 9.3 mm Hg and no PVL. There was mod-severe TR and pulm HTN. Groin site/subclavian siteare stable. He then developed profound bradycardia with a junctional rhythm and required PPM on 5/17. Continue Asprin 346m daily andplavix 75 mg daily.  CCobbwith a junctional escape: s/p St Jude PPM on 01/24/20 by Dr. KCaryl Comes  Acute CVA: pt developed AMS post operatively. Stat head CT was negative. Follow up MRI showed numerous small acute infarcts in the right greater than left cerebral hemispheres and left cerebellum compatible with a central embolic source. CTA and carotid dopplers also showed total occlusion on RICA. Etiology could be multiple, large vessel occlusion/stenosis in the setting of procedure/low BP vs. TAVR procedure related vs. chronic afib not on AC. Neurology has recommended asa 325 mg daily and plavix 765mdaily.   HTN: BP better controlled today  Acute on chronic anemia: he was transfused 1U PRBCS. Hg has remained stable  Acute on chronic diastolic [facility]:NOBSJGGresented with progressive dyspnea and New York Heart Association functional class III symptoms, elevated BNP, and increasing diuretic requirement. His postoperative day #1 echo demonstrates evidence of pulmonary hypertension and RV volume overload. There as interstitial edema noted on x-ray.Restarted on home lasix 2079maily. Creat has remained stable.   CAD:s/p complex atherectomy and PCIoncomplicated by dissection/contained perforation treated with overlapping DES and a Papyrus covered stenton 12/24/19. No recurrent angina. Continue ASA and plavix(long term).  Chronic afib:underlying afib. Has previously refused oral anticoagulation and has a history of GI/GU bleeding.    DMT2:continue SSI  Carotid artery disease: 80-99% RICA stenosis. Followed by Dr. BraTrula Sladepossible TCAR candidate).CTA head and neck completed in hospital and showed right ICA occlusion at origin, severe stenosis of proximal left ICA, >80% stenosis. Bilateral VA origin stenosis, 70% on the right, 50% on the left. Dense calcification plaque at the basilar tip, new from previous CT, with bilateral stenosis of P1 origins.Dr. BraTrula Sladew him yesterday and recommends no revascularization for RICA but he will need left carotid intervention either endarterectomy or TCAR once his new baseline neurologic status is established.  Left arm weakness: most likely related to CVA vs brachial plexus injury  [facility]  _____________  Discharge Vitals Blood pressure (!) 142/67, pulse 60, temperature (!) 97.4 F (36.3 C), temperature source Oral, resp. rate 18, height '5\' 4"'  (1.626 m), weight 55.3 kg, SpO2 100 %.  Filed Weights   01/26/20 0313 01/27/20 0500 01/28/20 0401  Weight: 58.3 kg 57.6 kg 55.3 kg    Labs & Radiologic Studies    CBC Recent Labs    01/26/20 0813  WBC 8.6  HGB 10.8*  HCT 33.4*  MCV 96.5  PLT [id number] Metabolic Panel Recent Labs    01/26/20 0813  NA 141  K 4.0  CL 103  CO2 25  GLUCOSE 169*  BUN 23  CREATININE 1.17  CALCIUM 8.8*   Liver Function Tests No results for input(s): AST, ALT, ALKPHOS, BILITOT, PROT, ALBUMIN in the last 72 hours. No results for input(s): LIPASE, AMYLASE in the last 72 hours. Cardiac Enzymes No results for input(s): CKTOTAL, CKMB, CKMBINDEX, TROPONINI in the last 72 hours. BNP Invalid input(s): POCBNP D-Dimer No results for input(s): DDIMER in the last 72 hours. Hemoglobin A1C No results for input(s): HGBA1C in the last 72 hours. Fasting Lipid Panel No results for input(s): CHOL, HDL, LDLCALC, TRIG,  CHOLHDL, LDLDIRECT in the last 72 hours. Thyroid Function Tests No  results for input(s): TSH, T4TOTAL, T3FREE, THYROIDAB in the last 72 hours.  Invalid input(s): FREET3 _____________  CT ANGIO HEAD W OR WO CONTRAST  Result Date: 01/23/2020 CLINICAL DATA:  Follow-up stroke. Left hemiparesis and neglect. Multiple bilateral micro embolic infarctions. EXAM: CT ANGIOGRAPHY HEAD AND NECK TECHNIQUE: Multidetector CT imaging of the head and neck was performed using the standard protocol during bolus administration of intravenous contrast. Multiplanar CT image reconstructions and MIPs were obtained to evaluate the vascular anatomy. Carotid stenosis measurements (when applicable) are obtained utilizing NASCET criteria, using the distal internal carotid diameter as the denominator. CONTRAST:  8m OMNIPAQUE IOHEXOL 350 MG/ML SOLN COMPARISON:  MRI yesterday. FINDINGS: CT HEAD FINDINGS Brain: Brain shows age related atrophy. Chronic small-vessel ischemic changes seen throughout the hemispheric white matter. Some of the low densities particularly on the right probably correlate with the areas of acute infarction. No large confluent infarction. No mass effect, hemorrhage, hydrocephalus or extra-axial collection. Vascular: There is atherosclerotic calcification of the major vessels at the base of the brain. Skull: Negative Sinuses: Previous functional endoscopic sinus surgery. Orbits: Negative Review of the MIP images confirms the above findings CTA NECK FINDINGS Aortic arch: Aortic atherosclerosis. Previous TAVR. Coronary artery atherosclerosis and stents. Branching pattern of the aorta is normal without origin stenosis. Right carotid system: Common carotid artery shows some atherosclerotic plaque but is widely patent to the bifurcation region. Dense calcified plaque at the carotid bifurcation and ICA bulb. 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 No reconstitution. Left carotid system: Common carotid artery shows some scattered plaque but is widely patent to the bifurcation region. Calcified  plaque at the carotid bifurcation, ICA origin and ICA bulb. Severe stenosis of the proximal ICA, difficulty to accurately measure because of the dense calcification. Patent lumen is probably 1 mm or smaller. Beyond that, cervical ICA is patent to the skull base. Vertebral arteries: Large bilateral vertebral arteries. Dense calcification at both vertebral artery origins. 70% stenosis on the right. 50% stenosis on the left. Skeleton: Exaggerated cervical lordosis and thoracic kyphosis. Degenerative cervical spondylosis. Partial compression fracture of T3, likely old. Other neck: No mass or lymphadenopathy. Upper chest: Emphysema and pulmonary scarring. Review of the MIP images confirms the above findings CTA HEAD FINDINGS Anterior circulation: Left internal carotid artery is patent through the skull base and siphon regions. Ordinary siphon atherosclerotic calcification without stenosis greater than 30%. Supraclinoid ICA widely patent. Good supply to the left anterior and middle cerebral arteries. Patent anterior communicating artery allowing supply to the right anterior circulation. No right internal carotid flow at the skull base. Probable external to internal collaterals as well. Patent right posterior communicating artery as well. No large or medium vessel occlusion. Posterior circulation: Both vertebral arteries widely patent through the foramen magnum. Atherosclerotic calcification of the left V4 segment with stenosis estimated at 30%. No basilar stenosis. Dense calcification at the basilar tip. This was not present on a head CT 12/28/2019, therefore this is an embolic calcification. There is result in stenosis of both P1 origins, but those vessels do continue to show flow. Venous sinuses: Patent normal. Anatomic variants: None significant. 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 This may well be chronic. Aortic  atherosclerosis. Advanced atherosclerotic disease at the left carotid bifurcation and proximal ICA. Severe stenosis of the proximal ICA with luminal diameter estimated at 1 mm. This would be  an 80% or greater stenosis. Stenotic disease at both vertebral artery origins. 70% stenosis on the right. 50% stenosis on the left. Dense calcification at the basilar tip consistent with embolized plaque. This was not present on a CT scan 12/28/2019. No acute intracranial large vessel occlusion. The embolized plaque at the basilar tip does result in stenosis of both P1 origins, but those vessels continue to show flow. Electronically Signed   By: Nelson Chimes M.D.   On: 01/23/2020 11:24   DG Chest 2 View  Result Date: 01/25/2020 CLINICAL DATA:  Cardiac arrhythmia with pacemaker placement EXAM: CHEST - 2 VIEW COMPARISON:  Jan 21, 2020. FINDINGS: There is now a pacemaker on the right with lead tip attached to the right ventricle. No pneumothorax. There is bibasilar atelectasis. Lungs elsewhere are clear. Heart is slightly enlarged with pulmonary vascularity within normal limits. Patient is status post coronary artery bypass grafting and aortic valve replacement. There is aortic atherosclerosis. Bones appear osteoporotic. IMPRESSION: Pacemaker lead tip attached to right ventricle. No pneumothorax. Bibasilar atelectasis. Stable cardiac prominence with postoperative changes. Aortic Atherosclerosis (ICD10-I70.0). Electronically Signed   By: Lowella Grip III M.D.   On: 01/25/2020 08:34   DG Chest 2 View  Result Date: 01/13/2020 CLINICAL DATA:  Aortic stenosis. Preoperative aortic valve replacement EXAM: CHEST - 2 VIEW COMPARISON:  Chest radiograph December 29, 2019 and chest CT December 28, 2019 FINDINGS: There is cardiomegaly with mild pulmonary venous hypertension. There is interstitial edema with small pleural effusions bilaterally. There is bibasilar atelectasis. No consolidation. Patient is status post coronary artery bypass  grafting. There is aortic atherosclerosis. There is stable anterior wedging of midthoracic and upper lumbar vertebral bodies. There is postoperative change in the left shoulder. There is superior migration of the right humeral head. IMPRESSION: Appearance felt to be indicative of a degree of congestive heart failure. No airspace consolidation. There is bibasilar atelectasis. Postoperative changes noted. Arthropathy in each shoulder with apparent chronic rotator cuff tear on the right. Aortic Atherosclerosis (ICD10-I70.0). Electronically Signed   By: Lowella Grip III M.D.   On: 01/13/2020 09:07   CT HEAD WO CONTRAST  Result Date: 01/20/2020 CLINICAL DATA:  Altered mental status. EXAM: CT HEAD WITHOUT CONTRAST TECHNIQUE: Contiguous axial images were obtained from the base of the skull through the vertex without intravenous contrast. COMPARISON:  Head CT scan 12/28/2019 FINDINGS: Brain: No evidence of acute infarction, hemorrhage, hydrocephalus, extra-axial collection or mass lesion/mass effect. There is some cortical atrophy and chronic microvascular ischemic disease, unchanged. Vascular: Atherosclerosis. Skull: Intact.  No focal lesion. Sinuses/Orbits: Status post maxillary antrostomy and ethmoidectomy. Mild mucosal thickening right maxillary sinus noted. Other: None. IMPRESSION: No acute abnormality. Electronically Signed   By: Inge Rise M.D.   On: 01/20/2020 13:38   CT ANGIO NECK W OR WO CONTRAST  Result Date: 01/23/2020 CLINICAL DATA:  Follow-up stroke. Left hemiparesis and neglect. Multiple bilateral micro embolic infarctions. EXAM: CT ANGIOGRAPHY HEAD AND NECK TECHNIQUE: Multidetector CT imaging of the head and neck was performed using the standard protocol during bolus administration of intravenous contrast. Multiplanar CT image reconstructions and MIPs were obtained to evaluate the vascular anatomy. Carotid stenosis measurements (when applicable) are obtained utilizing NASCET criteria,  using the distal internal carotid diameter as the denominator. CONTRAST:  65m OMNIPAQUE IOHEXOL 350 MG/ML SOLN COMPARISON:  MRI yesterday. FINDINGS: CT HEAD FINDINGS Brain: Brain shows age related atrophy. Chronic small-vessel ischemic changes seen throughout the hemispheric white matter. Some of the low densities particularly on the  right probably correlate with the areas of acute infarction. No large confluent infarction. No mass effect, hemorrhage, hydrocephalus or extra-axial collection. Vascular: There is atherosclerotic calcification of the major vessels at the base of the brain. Skull: Negative Sinuses: Previous functional endoscopic sinus surgery. Orbits: Negative Review of the MIP images confirms the above findings CTA NECK FINDINGS Aortic arch: Aortic atherosclerosis. Previous TAVR. Coronary artery atherosclerosis and stents. Branching pattern of the aorta is normal without origin stenosis. Right carotid system: Common carotid artery shows some atherosclerotic plaque but is widely patent to the bifurcation region. Dense calcified plaque at the carotid bifurcation and ICA bulb. 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 No reconstitution. Left carotid system: Common carotid artery shows some scattered plaque but is widely patent to the bifurcation region. Calcified plaque at the carotid bifurcation, ICA origin and ICA bulb. Severe stenosis of the proximal ICA, difficulty to accurately measure because of the dense calcification. Patent lumen is probably 1 mm or smaller. Beyond that, cervical ICA is patent to the skull base. Vertebral arteries: Large bilateral vertebral arteries. Dense calcification at both vertebral artery origins. 70% stenosis on the right. 50% stenosis on the left. Skeleton: Exaggerated cervical lordosis and thoracic kyphosis. Degenerative cervical spondylosis. Partial compression fracture of T3, likely old. Other neck: No mass or lymphadenopathy. Upper chest: Emphysema and pulmonary scarring.  Review of the MIP images confirms the above findings CTA HEAD FINDINGS Anterior circulation: Left internal carotid artery is patent through the skull base and siphon regions. Ordinary siphon atherosclerotic calcification without stenosis greater than 30%. Supraclinoid ICA widely patent. Good supply to the left anterior and middle cerebral arteries. Patent anterior communicating artery allowing supply to the right anterior circulation. No right internal carotid flow at the skull base. Probable external to internal collaterals as well. Patent right posterior communicating artery as well. No large or medium vessel occlusion. Posterior circulation: Both vertebral arteries widely patent through the foramen magnum. Atherosclerotic calcification of the left V4 segment with stenosis estimated at 30%. No basilar stenosis. Dense calcification at the basilar tip. This was not present on a head CT 12/28/2019, therefore this is an embolic calcification. There is result in stenosis of both P1 origins, but those vessels do continue to show flow. Venous sinuses: Patent normal. Anatomic variants: None significant. 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 This may well be chronic. Aortic atherosclerosis. Advanced atherosclerotic disease at the left carotid bifurcation and proximal ICA. Severe stenosis of the proximal ICA with luminal diameter estimated at 1 mm. This would be an 80% or greater stenosis. Stenotic disease at both vertebral artery origins. 70% stenosis on the right. 50% stenosis on the left. Dense calcification at the basilar tip consistent with embolized plaque. This was not present on a CT scan 12/28/2019. No acute intracranial large vessel occlusion. The embolized plaque at the basilar tip does result in stenosis of both P1 origins, but those vessels continue to show flow. Electronically Signed   By: Nelson Chimes M.D.   On:  01/23/2020 11:24   MR BRAIN W WO CONTRAST  Result Date: 01/22/2020 CLINICAL DATA:  Altered mental status, mild left hemiparesis, and left hemineglect. EXAM: MRI HEAD WITHOUT AND WITH CONTRAST TECHNIQUE: Multiplanar, multiecho pulse sequences of the brain and surrounding structures were obtained without and with intravenous contrast. CONTRAST:  19m GADAVIST GADOBUTROL 1 MMOL/ML IV SOLN COMPARISON:  Head CT 01/20/2020 FINDINGS: Brain: There are numerous small acute infarcts involving  cortex and white matter of both cerebral hemispheres, overall right greater than left and with greatest involvement of the right frontal and parietal lobes towards the vertex including in the centrum semiovale. Small acute infarcts are also present in the basal ganglia bilaterally and in the left cerebellar hemisphere. A chronic microhemorrhage is noted in the right temporal lobe. There is a chronic lacunar infarct in the right lentiform nucleus. Mild cerebral atrophy is within normal limits for age. Postcontrast sequences are severely motion degraded. No mass, midline shift, or extra-axial fluid collection is identified. Vascular: Abnormal appearance of the right internal carotid artery. Skull and upper cervical spine: Unremarkable bone marrow signal. Sinuses/Orbits: Right cataract extraction. Mild mucosal thickening in the paranasal sinuses. Trace right mastoid fluid. Other: None. IMPRESSION: 1. Numerous small acute infarcts in the right greater than left cerebral hemispheres and left cerebellum compatible with a central embolic source. 2. 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 Electronically Signed   By: Logan Bores M.D.   On: 01/22/2020 19:20   DG Chest Port 1 View  Result Date: 01/18/2020 CLINICAL DATA:  TAVR EXAM: PORTABLE CHEST 1 VIEW COMPARISON:  01/13/2020 FINDINGS: TAVR in satisfactory position. Cardiac enlargement with mild vascular congestion. Prominent interstitial markings may be due to mild  interstitial edema. No pleural effusion. No pneumothorax. Lungs are well aerated. IMPRESSION: Postop TAVR.  Persistent vascular congestion and interstitial edema. Electronically Signed   By: Franchot Gallo M.D.   On: 01/18/2020 15:45   DG Chest Port 1V same Day  Result Date: 01/21/2020 CLINICAL DATA:  Shortness of breath EXAM: PORTABLE CHEST 1 VIEW COMPARISON:  Jan 18, 2020 FINDINGS: There is slight bibasilar atelectasis. Lungs otherwise are clear. There is cardiomegaly with pulmonary vascularity normal. Patient is status post coronary artery bypass grafting and aortic valve replacement. No adenopathy. Surgical clips noted in the left upper chest region. No evident pneumothorax. No bone lesions. There is aortic atherosclerosis. IMPRESSION: Slight bibasilar atelectasis. No edema or airspace opacity evident. Stable cardiomegaly with postoperative changes. Aortic Atherosclerosis (ICD10-I70.0). Electronically Signed   By: Lowella Grip III M.D.   On: 01/21/2020 07:58   ECHO TEE  Result Date: 01/18/2020    TRANSESOPHOGEAL ECHO REPORT   Patient Name:   Garrett Hughes [facility]  Date of Exam: 01/18/2020 Medical Rec #:  [id number]      Height:       64.0 in Accession #:    [id number]     Weight:       140.0 lb Date of Birth:  Aug 03, 1941      BSA:          1.681 m Patient Age:    11 years       BP:           192/73 mmHg Patient Gender: M              HR:           73 bpm. Exam Location:  Anesthesiology Procedure: TEE-Intraopertive, Cardiac Doppler and Color Doppler Indications:     Aortic stenosis  History:         Patient has prior history of Echocardiogram examinations, most                  recent 12/26/2019. CAD, Prior CABG, Aortic Valve Disease and                  Mitral Valve Disease; Risk Factors:Hypertension, Diabetes and  Dyslipidemia.                  Aortic Valve: 26 mm Edwards Medtronic, stented (TAVR) valve is                  present in the aortic position. Procedure Date: 01/18/2020.   Sonographer:     Dustin Flock RDCS Referring Phys:  [phone] MICHAEL COOPER Diagnosing Phys: Sanda Klein MD PROCEDURE: The transesophogeal probe was passed without difficulty through the esophogus of the patient. Sedation performed by different physician. The patient was monitored while under deep sedation. The patient developed no complications during the procedure. PRE-PROCEDURAL FINDINGS: Normal left ventricular systolic function. Estimated LVEF 55-60%. There are no regional wall motion abnormalities. Severe calcific aortic stenosis. Trileaflet aortic valve. Peak aortic valve gradient 60 mm Hg, mean gradient 31 mm Hg. Dimensionless obstructive index 0.15, calculated aortic valve area is 0.8 cm. Mild aortic insufficiency. Moderate mitral insufficiency. Small posterior pericardial effusion. POST-PROCEDURAL FINDINGS: Hyperdynamic left ventricular systolic function. Estimated LVEF 70%. There are no regional wall motion abnormalities. Well-seated TAVR stent-valve. Peak aortic valve gradient 5 mm Hg, mean gradient 2 mm Hg. Dimensionless obstructive index 0.43, calculated aortic valve area is 1.43 cm. There is no aortic insufficiency and no perivalvular leak. Mild mitral insufficiency, substantially improved. Small posterior pericardial effusion. IMPRESSIONS  1. Left ventricular ejection fraction, by estimation, is 50 to 55%. The left ventricle has low normal function. The left ventricle has no regional wall motion abnormalities. There is moderate concentric left ventricular hypertrophy.  2. Right ventricular systolic function is normal. The right ventricular size is normal.  3. Left atrial size was severely dilated. No left atrial/left atrial appendage thrombus was detected.  4. Right atrial size was moderately dilated.  5. The mitral valve is grossly normal. Moderate mitral valve regurgitation.  6. The aortic valve is tricuspid. Aortic valve regurgitation is mild. Severe aortic valve stenosis. There is a 26 mm  Edwards Medtronic, stented (TAVR) valve present in the aortic position. Procedure Date: 01/18/2020.  7. There is mild (Grade II) atheroma plaque. FINDINGS  Left Ventricle: Left ventricular ejection fraction, by estimation, is 50 to 55%. The left ventricle has low normal function. The left ventricle has no regional wall motion abnormalities. The left ventricular internal cavity size was normal in size. There is moderate concentric left ventricular hypertrophy. Right Ventricle: The right ventricular size is normal. No increase in right ventricular wall thickness. Right ventricular systolic function is normal. Left Atrium: Left atrial size was severely dilated. No left atrial/left atrial appendage thrombus was detected. Right Atrium: Right atrial size was moderately dilated. Pericardium: A small pericardial effusion is present. The pericardial effusion is posterior to the left ventricle. Mitral Valve: The mitral valve is grossly normal. Mild mitral annular calcification. Moderate mitral valve regurgitation, with centrally-directed jet. Tricuspid Valve: The tricuspid valve is normal in structure. Tricuspid valve regurgitation is mild. Aortic Valve: The aortic valve is tricuspid. . There is severe thickening and severe calcifcation of the aortic valve. Aortic valve regurgitation is mild. Severe aortic stenosis is present. There is severe thickening of the aortic valve. There is severe calcifcation of the aortic valve. Aortic valve mean gradient measures 2.0 mmHg. Aortic valve peak gradient measures 4.8 mmHg. Aortic valve area, by VTI measures 1.43 cm. There is a 26 mm Edwards Medtronic, stented (TAVR) valve present in the aortic position. Procedure Date: 01/18/2020. Pulmonic Valve: The pulmonic valve was grossly normal. Pulmonic valve regurgitation is not visualized. Aorta: The aortic  root, ascending aorta, aortic arch and descending aorta are all structurally normal, with no evidence of dilitation or obstruction. There  is mild (Grade II) atheroma plaque. IAS/Shunts: No atrial level shunt detected by color flow Doppler. Additional Comments: A pacer wire is visualized.  LEFT VENTRICLE PLAX 2D LVOT diam:     2.06 cm LV SV:         29 LV SV Index:   17 LVOT Area:     3.33 cm  AORTIC VALVE AV Area (Vmax):    1.72 cm AV Area (Vmean):   1.88 cm AV Area (VTI):     1.43 cm AV Vmax:           109.77 cm/s AV Vmean:          65.193 cm/s AV VTI:            0.202 m AV Peak Grad:      4.8 mmHg AV Mean Grad:      2.0 mmHg LVOT Vmax:         56.50 cm/s LVOT Vmean:        36.749 cm/s LVOT VTI:          0.086 m LVOT/AV VTI ratio: 0.43 TRICUSPID VALVE TV Peak grad:   47.6 mmHg TV Vmax:        3.45 m/s  SHUNTS Systemic VTI:  0.09 m Systemic Diam: 2.06 cm Dani Gobble Croitoru MD Electronically signed by Sanda Klein MD Signature Date/Time: 01/18/2020/4:28:04 PM    Final    VAS US CAROTID  Result Date: 01/23/2020 Carotid Arterial Duplex Study Indications:       Known carotid stenosis, S/P TAVR with new right sided                    weakness and AMS, suspect stroke. Other Factors:     01/23/20 CTA results right ICA occluded and left ICA severe                    stenosis. Comparison Study:  12/08/19 right 80-99% left 40-59% Performing Technologist: June Leap RDMS, RVT  Examination Guidelines: A complete evaluation includes B-mode imaging, spectral Doppler, color Doppler, and power Doppler as needed of all accessible portions of each vessel. Bilateral testing is considered an integral part of a complete examination. Limited examinations for reoccurring indications may be performed as noted.  Right Carotid Findings: +----------+--------+--------+--------+------------------+-------------------+           PSV cm/sEDV cm/sStenosisPlaque DescriptionComments            +----------+--------+--------+--------+------------------+-------------------+ CCA Prox  36      3                                 High resistant flow  +----------+--------+--------+--------+------------------+-------------------+ CCA Distal32      5                                 High resistant flow +----------+--------+--------+--------+------------------+-------------------+ ICA Prox                  Occluded                                      +----------+--------+--------+--------+------------------+-------------------+ ECA       629  36      >50%                                          +----------+--------+--------+--------+------------------+-------------------+ +----------+--------+-------+----------------+-------------------+           PSV cm/sEDV cmsDescribe        Arm Pressure (mmHG) +----------+--------+-------+----------------+-------------------+ [id number]            Multiphasic, WNL                    +----------+--------+-------+----------------+-------------------+ +---------+--------+--+--------+--+---------+ VertebralPSV cm/s74EDV cm/s22Antegrade +---------+--------+--+--------+--+---------+  Left Carotid Findings: +----------+--------+--------+--------+------------------------------+---------+           PSV cm/sEDV cm/sStenosisPlaque Description            Comments  +----------+--------+--------+--------+------------------------------+---------+ CCA Prox  131     25                                                      +----------+--------+--------+--------+------------------------------+---------+ CCA Distal103     22                                                      +----------+--------+--------+--------+------------------------------+---------+ ICA Prox  473     138     80-99%  calcific, heterogenous and    Shadowing                                   irregular                               +----------+--------+--------+--------+------------------------------+---------+ ICA Mid   109     36                                                       +----------+--------+--------+--------+------------------------------+---------+ ICA Distal108     34                                                      +----------+--------+--------+--------+------------------------------+---------+ ECA       400     40      >50%                                            +----------+--------+--------+--------+------------------------------+---------+ +----------+--------+--------+----------------+-------------------+           PSV cm/sEDV cm/sDescribe        Arm Pressure (mmHG) +----------+--------+--------+----------------+-------------------+ Subclavian230             Multiphasic, WNL                    +----------+--------+--------+----------------+-------------------+ +---------+--------+--+--------+--+---------+  VertebralPSV cm/s78EDV cm/s19Antegrade +---------+--------+--+--------+--+---------+   Summary: Right Carotid: Evidence consistent with a total occlusion of the right ICA. Left Carotid: Velocities in the left ICA are consistent with a 80-99% stenosis.  *See table(s) above for measurements and observations.  Electronically signed by Ruta Hinds MD on 01/23/2020 at 6:43:02 PM.    Final    ECHOCARDIOGRAM LIMITED  Result Date: 01/19/2020    ECHOCARDIOGRAM LIMITED REPORT   Patient Name:   Garrett Hughes [facility] Date of Exam: 01/19/2020 Medical Rec #:  [id number]     Height:       64.0 in Accession #:    [id number]    Weight:       140.0 lb Date of Birth:  12/22/1940     BSA:          1.681 m Patient Age:    10 years      BP:           113/47 mmHg Patient Gender: M             HR:           49 bpm. Exam Location:  Inpatient Procedure: Limited Echo, Limited Color Doppler and Cardiac Doppler Indications:    Post TAVR evaluation Z95.2  History:        Patient has prior history of Echocardiogram examinations, most                 recent 01/18/2020. Prior CABG, COPD; Risk Factors:Dyslipidemia,                 Diabetes and Hypertension.                  Aortic Valve: 26 mm Edwards Sapien prosthetic, stented (TAVR)                 valve is present in the aortic position. Procedure Date:                 01/18/2020.  Sonographer:    Mikki Santee RDCS (AE) Referring Phys: [id number] [place]  1. Left ventricular ejection fraction, by estimation, is 70 to 75%. The left ventricle has hyperdynamic function. The left ventricle has no regional wall motion abnormalities. There is mild concentric left ventricular hypertrophy. Left ventricular diastolic function could not be evaluated. There is the interventricular septum is flattened in systole, consistent with right ventricular pressure overload.  2. Right ventricular systolic function is mildly reduced. The right ventricular size is moderately enlarged. There is severely elevated pulmonary artery systolic pressure. The estimated right ventricular systolic pressure is 65.5 mmHg.  3. The mitral valve is normal in structure. Mild to moderate mitral valve regurgitation.  4. Tricuspid valve regurgitation is moderate to severe.  5. The aortic valve has been repaired/replaced. Aortic valve regurgitation is not visualized. There is a 26 mm Edwards Sapien prosthetic (TAVR) valve present in the aortic position. Procedure Date: 01/18/2020. Echo findings are consistent with normal structure and function of the aortic valve prosthesis. Aortic valve mean gradient measures 9.3 mmHg. Aortic valve Vmax measures 2.24 m/s.  6. The inferior vena cava is dilated in size with <50% respiratory variability, suggesting right atrial pressure of 15 mmHg. Comparison(s): There is marked worsening of pulmonary artery hypertension and severity of tricuspid regurgitation since the intra-procedural study. FINDINGS  Left Ventricle: Left ventricular ejection fraction, by estimation, is 70 to 75%. The left ventricle has hyperdynamic function. The left ventricle has no regional wall motion abnormalities. The  left ventricular internal  cavity size was normal in size. There is mild concentric left ventricular hypertrophy. The interventricular septum is flattened in systole, consistent with right ventricular pressure overload. Left ventricular diastolic function could not be evaluated due to atrial fibrillation. Right Ventricle: The right ventricular size is moderately enlarged. No increase in right ventricular wall thickness. Right ventricular systolic function is mildly reduced. There is severely elevated pulmonary artery systolic pressure. The tricuspid regurgitant velocity is 4.19 m/s, and with an assumed right atrial pressure of 15 mmHg, the estimated right ventricular systolic pressure is 62.2 mmHg. Pericardium: There is no evidence of pericardial effusion. Mitral Valve: The mitral valve is normal in structure. Mild mitral annular calcification. Mild to moderate mitral valve regurgitation, with centrally-directed jet. MV peak gradient, 14.1 mmHg. The mean mitral valve gradient is 4.0 mmHg. Tricuspid Valve: The tricuspid valve is normal in structure. Tricuspid valve regurgitation is moderate to severe. Aortic Valve: The aortic valve has been repaired/replaced. Aortic valve regurgitation is not visualized. Aortic valve mean gradient measures 9.3 mmHg. Aortic valve peak gradient measures 20.1 mmHg. Aortic valve area, by VTI measures 2.75 cm. There is a 26 mm Edwards Sapien prosthetic, stented (TAVR) valve present in the aortic position. Procedure Date: 01/18/2020. Echo findings are consistent with normal structure and function of the aortic valve prosthesis. Pulmonic Valve: The pulmonic valve was normal in structure. Pulmonic valve regurgitation is not visualized. Aorta: The aortic root and ascending aorta are structurally normal, with no evidence of dilitation. Venous: The inferior vena cava is dilated in size with less than 50% respiratory variability, suggesting right atrial pressure of 15 mmHg.  LEFT VENTRICLE PLAX 2D LVIDd:         4.20 cm  LVIDs:         2.70 cm LV PW:         1.30 cm LV IVS:        1.30 cm LVOT diam:     2.40 cm LV SV:         129 LV SV Index:   77 LVOT Area:     4.52 cm  LEFT ATRIUM         Index LA diam:    4.30 cm 2.56 cm/m  AORTIC VALVE AV Area (Vmax):    2.83 cm AV Area (Vmean):   3.02 cm AV Area (VTI):     2.75 cm AV Vmax:           224.00 cm/s AV Vmean:          145.667 cm/s AV VTI:            0.470 m AV Peak Grad:      20.1 mmHg AV Mean Grad:      9.3 mmHg LVOT Vmax:         140.00 cm/s LVOT Vmean:        97.400 cm/s LVOT VTI:          0.286 m LVOT/AV VTI ratio: 0.61  AORTA Ao Root diam: 3.20 cm MITRAL VALVE             TRICUSPID VALVE MV Area (PHT): 1.79 cm  TR Peak grad:   70.2 mmHg MV Peak grad:  14.1 mmHg TR Vmax:        419.00 cm/s MV Mean grad:  4.0 mmHg MV Vmax:       1.88 m/s  SHUNTS MV Vmean:      81.2 cm/s Systemic VTI:  0.29 m  Systemic Diam: 2.40 cm Sanda Klein MD Electronically signed by Sanda Klein MD Signature Date/Time: 01/19/2020/5:11:08 PM    Final    LONG TERM MONITOR-LIVE TELEMETRY (3-14 DAYS)  Result Date: 01/20/2020 Atrial fibrillation with PVCs or aberrantly conducted beats.  Rate controlled. Kirk Ruths   Disposition   Pt is being discharged home today in good condition.  Follow-up Plans & Appointments     Contact information for follow-up providers    Portal Office Follow up.   Specialty: Cardiology Why: 02/03/2020 @ 4:00PM, wound check visit (Pacemaker) Contact information: [address] [place]       Deboraha Sprang, MD Follow up.   Specialty: Cardiology Why: 05/02/2020 @ 2:15PM, pacemaker follow up Contact information: [address] [address] [phone]        Garvin Fila, MD. Schedule an appointment as soon as possible for a visit in 4 week(s).   Specialties: Neurology, Radiology Contact information: [address] [place] [address] [phone]        Eileen Stanford, PA-C. Go on 02/10/2020.   Specialties: Cardiology, Radiology Why: Please arrive at 12:30pm for an echo followed by an appt with Nell Range at [facility] information: [place] [address] [phone]            Contact information for after-discharge care    [facility] .   Service: Skilled Nursing Contact information: [address] [place] [phone]                 Discharge Instructions    Ambulatory referral to Neurology   Complete by: As directed    Follow up with Dr. Leonie Man at [facility] in 4-6 weeks. Too complicated for NP to follow. Thanks.      Discharge Medications   Allergies as of 01/28/2020      Reactions   Atorvastatin    Other reaction(s): Myalgias (intolerance)   Cholestyramine Other (See Comments)   Constipation   Pravastatin Other (See Comments)   Does not tolerate high doses      Medication List    STOP taking these medications   aspirin 81 MG chewable tablet Replaced by: aspirin 325 MG EC tablet     TAKE these medications   Accu-Chek Aviva Plus test strip Generic drug: glucose blood Use as instructed to check blood sugar up to qid. Dx Code E11.22. Disp generic per pt preference / insurance coverage   Accu-Chek Aviva Plus w/Device Kit Use as instructed to check blood sugar up to qid. Dx code E11.22   Accu-Chek Aviva Soln Use as instructed per manufacturer directions. Dx code E11.22   Alcohol Swabs Pads Use as instructed to check blood sugar up to qid. Dx code E11.22   AMBULATORY NON FORMULARY MEDICATION Medication Name: accu-chek aviva plus meter, accu-check aviva plus test strips & accu-chek softclix lancets to test twice a day. Dx: E11.29   aspirin 325 MG EC tablet Take 1 tablet (325 mg total) by mouth daily. Start taking on: Jan 29, 2020 Replaces:  aspirin 81 MG chewable tablet   clopidogrel 75 MG tablet Commonly known as: PLAVIX Take 75 mg by mouth daily.   doxazosin 8 MG tablet Commonly known as: CARDURA TAKE 1 TABLET BY MOUTH  DAILY   ferrous sulfate 325 (65 FE) MG EC tablet Take 1  tablet (325 mg total) by mouth daily with breakfast.   furosemide 40 MG tablet Commonly known as: LASIX Take 0.5 tablets (20 mg total) by mouth daily.   glipiZIDE 5 MG tablet Commonly known as: GLUCOTROL TAKE 1 TABLET BY MOUTH  TWICE DAILY BEFORE MEALS What changed:   how much to take  how to take this  when to take this   lisinopril 5 MG tablet Commonly known as: ZESTRIL TAKE 1 TABLET BY MOUTH  DAILY   metFORMIN 1000 MG tablet Commonly known as: GLUCOPHAGE TAKE 1 TABLET BY MOUTH  TWICE DAILY WITH A MEAL What changed: See the new instructions.   metoprolol succinate 25 MG 24 hr tablet Commonly known as: TOPROL-XL Take 1 tablet (25 mg total) by mouth daily.   multivitamin with minerals Tabs tablet Take 1 tablet by mouth daily.   nitroGLYCERIN 0.4 MG SL tablet Commonly known as: NITROSTAT Place 1 tablet (0.4 mg total) under the tongue every 5 (five) minutes as needed for chest pain.   onetouch ultrasoft lancets Use as instructed to check blood sugar up to qid. Dx: E11.22   Accu-Chek Softclix Lancets lancets Use as instructed to check blood sugar up to qid. Dx Code E11.22.   pantoprazole 20 MG tablet Commonly known as: PROTONIX TAKE 1 TABLET BY MOUTH  DAILY   pravastatin 40 MG tablet Commonly known as: PRAVACHOL Take 1 tablet (40 mg total) by mouth daily.           Outstanding Labs/Studies   none  Duration of Discharge Encounter   Greater than 30 minutes including physician time.  SignedAngelena Form, PA-C 01/28/2020, 11:50 AM [phone]

## 2020-01-20 NOTE — Progress Notes (Signed)
Progress Note  Patient Name: Garrett Hughes Date of Encounter: 01/20/2020  Primary Cardiologist: Garrett Millers, MD   Subjective   Denies chest pain or shortness of breath today.  Wife and daughter are at the bedside.  RN is at the bedside.  He has had altered mental status overnight and he has a mitten on his right hand at the time of my evaluation this morning.  He is mildly somnolent but easy to arouse and he does follow commands.  He is oriented to person and place.  Inpatient Medications    Scheduled Meds: . aspirin  81 mg Oral Daily  . Chlorhexidine Gluconate Cloth  6 each Topical Daily  . clopidogrel  75 mg Oral Daily  . ferrous sulfate  325 mg Oral Q breakfast  . insulin aspart  0-24 Units Subcutaneous TID AC & HS  . insulin aspart  5 Units Subcutaneous TID WC  . insulin glargine  5 Units Subcutaneous Daily  . lisinopril  5 mg Oral Daily  . pantoprazole  20 mg Oral Daily  . pravastatin  40 mg Oral Daily  . sodium chloride flush  3 mL Intravenous Q12H   Continuous Infusions: . sodium chloride    . sodium chloride Stopped (01/20/20 0135)  . cefUROXime (ZINACEF)  IV Stopped (01/19/20 2331)  . nitroGLYCERIN Stopped (01/19/20 0659)   PRN Meds: sodium chloride, acetaminophen **OR** acetaminophen, morphine injection, ondansetron (ZOFRAN) IV, oxyCODONE, sodium chloride flush, traMADol   Vital Signs    Vitals:   01/20/20 0337 01/20/20 0339 01/20/20 0521 01/20/20 0803  BP: (!) 163/78  (!) 144/56 140/76  Pulse: (!) 55   (!) 55  Resp: 18   20  Temp: 97.7 F (36.5 C)   98.7 F (37.1 C)  TempSrc: Oral   Oral  SpO2: 95%   97%  Weight:  65.2 kg    Height:        Intake/Output Summary (Last 24 hours) at 01/20/2020 1053 Last data filed at 01/20/2020 0900 Gross per 24 hour  Intake 742.19 ml  Output 900 ml  Net -157.81 ml   Last 3 Weights 01/20/2020 01/18/2020 01/13/2020  Weight (lbs) 143 lb 11.8 oz 140 lb 147 lb 3.2 oz  Weight (kg) 65.2 kg 63.504 kg 66.769 kg       Telemetry    Junctional bradycardia 50 bpm, no high-grade AV block, no pathologic pauses greater than 3 seconds - Personally Reviewed  ECG    Atrial fibrillation with competing junctional pacemaker, heart rate 50 bpm, right bundle branch block, left anterior fascicular block - Personally Reviewed  Physical Exam  Elderly male in no distress GEN: No acute distress.   Neck: No JVD Cardiac: RRR, no murmurs, rubs, or gallops.  Respiratory:  Breath sounds diminished in both bases, fine inspiratory crackles noted GI: Soft, nontender, mildly distended but nontender MS:  Trace bilateral pretibial edema; No deformity. Neuro:   Moves all extremities to command, a little slower moving his left side but he is able to raise his left arm to command after he is redirected. Psych: Somnolent but easily arousable, able to direct  Labs    High Sensitivity Troponin:   Recent Labs  Lab 12/28/19 0222 12/28/19 0355 12/28/19 2001 12/28/19 2153  TROPONINIHS 1,590* 1,542* 1,176* 1,013*      Chemistry Recent Labs  Lab 01/18/20 1425 01/18/20 1425 01/18/20 1429 01/19/20 0453 01/20/20 0407  NA 139   < > 140 134* 135  K 3.6   < >  3.5 3.9 4.3  CL 100  --   --  101 103  CO2  --   --   --  20* 19*  GLUCOSE 162*  --   --  226* 153*  BUN 15  --   --  17 21  CREATININE 0.60*  --   --  1.13 1.22  CALCIUM  --   --   --  8.2* 8.3*  GFRNONAA  --   --   --  >60 56*  GFRAA  --   --   --  >60 >60  ANIONGAP  --   --   --  13 13   < > = values in this interval not displayed.     Hematology Recent Labs  Lab 01/18/20 2001 01/19/20 0453 01/20/20 0407  WBC  --  18.4* 15.9*  RBC  --  3.27* 3.21*  HGB 9.9* 10.3* 10.1*  HCT 29.7* 30.9* 30.6*  MCV  --  94.5 95.3  MCH  --  31.5 31.5  MCHC  --  33.3 33.0  RDW  --  17.2* 16.7*  PLT  --  189 164    BNPNo results for input(s): BNP, PROBNP in the last 168 hours.   DDimer No results for input(s): DDIMER in the last 168 hours.   Radiology    DG  Chest Port 1 View  Result Date: 01/18/2020 CLINICAL DATA:  TAVR EXAM: PORTABLE CHEST 1 VIEW COMPARISON:  01/13/2020 FINDINGS: TAVR in satisfactory position. Cardiac enlargement with mild vascular congestion. Prominent interstitial markings may be due to mild interstitial edema. No pleural effusion. No pneumothorax. Lungs are well aerated. IMPRESSION: Postop TAVR.  Persistent vascular congestion and interstitial edema. Electronically Signed   By: Marlan Palau M.D.   On: 01/18/2020 15:45   ECHO TEE  Result Date: 01/18/2020    TRANSESOPHOGEAL ECHO REPORT   Patient Name:   Garrett Hughes [facility]  Date of Exam: 01/18/2020 Medical Rec #:  [id number]      Height:       64.0 in Accession #:    [id number]     Weight:       140.0 lb Date of Birth:  [birth year]      BSA:          1.681 m Patient Age:    79 years       BP:           192/73 mmHg Patient Gender: M              HR:           73 bpm. Exam Location:  Anesthesiology Procedure: TEE-Intraopertive, Cardiac Doppler and Color Doppler Indications:     Aortic stenosis  History:         Patient has prior history of Echocardiogram examinations, most                  recent 12/26/2019. CAD, Prior CABG, Aortic Valve Disease and                  Mitral Valve Disease; Risk Factors:Hypertension, Diabetes and                  Dyslipidemia.                  Aortic Valve: 26 mm Edwards Medtronic, stented (TAVR) valve is                  present in the  aortic position. Procedure Date: 01/18/2020.  Sonographer:     Lavenia AtlasBrooke Strickland RDCS Referring Phys:  [phone] Garrett Hughes Diagnosing Phys: Garrett FairMihai Croitoru MD PROCEDURE: The transesophogeal probe was passed without difficulty through the esophogus of the patient. Sedation performed by different physician. The patient was monitored while under deep sedation. The patient developed no complications during the procedure. PRE-PROCEDURAL FINDINGS: Normal left ventricular systolic function. Estimated LVEF 55-60%. There are no regional wall motion  abnormalities. Severe calcific aortic stenosis. Trileaflet aortic valve. Peak aortic valve gradient 60 mm Hg, mean gradient 31 mm Hg. Dimensionless obstructive index 0.15, calculated aortic valve area is 0.8 cm. Mild aortic insufficiency. Moderate mitral insufficiency. Small posterior pericardial effusion. POST-PROCEDURAL FINDINGS: Hyperdynamic left ventricular systolic function. Estimated LVEF 70%. There are no regional wall motion abnormalities. Well-seated TAVR stent-valve. Peak aortic valve gradient 5 mm Hg, mean gradient 2 mm Hg. Dimensionless obstructive index 0.43, calculated aortic valve area is 1.43 cm. There is no aortic insufficiency and no perivalvular leak. Mild mitral insufficiency, substantially improved. Small posterior pericardial effusion. IMPRESSIONS  1. Left ventricular ejection fraction, by estimation, is 50 to 55%. The left ventricle has low normal function. The left ventricle has no regional wall motion abnormalities. There is moderate concentric left ventricular hypertrophy.  2. Right ventricular systolic function is normal. The right ventricular size is normal.  3. Left atrial size was severely dilated. No left atrial/left atrial appendage thrombus was detected.  4. Right atrial size was moderately dilated.  5. The mitral valve is grossly normal. Moderate mitral valve regurgitation.  6. The aortic valve is tricuspid. Aortic valve regurgitation is mild. Severe aortic valve stenosis. There is a 26 mm Edwards Medtronic, stented (TAVR) valve present in the aortic position. Procedure Date: 01/18/2020.  7. There is mild (Grade II) atheroma plaque. FINDINGS  Left Ventricle: Left ventricular ejection fraction, by estimation, is 50 to 55%. The left ventricle has low normal function. The left ventricle has no regional wall motion abnormalities. The left ventricular internal cavity size was normal in size. There is moderate concentric left ventricular hypertrophy. Right Ventricle: The right  ventricular size is normal. No increase in right ventricular wall thickness. Right ventricular systolic function is normal. Left Atrium: Left atrial size was severely dilated. No left atrial/left atrial appendage thrombus was detected. Right Atrium: Right atrial size was moderately dilated. Pericardium: A small pericardial effusion is present. The pericardial effusion is posterior to the left ventricle. Mitral Valve: The mitral valve is grossly normal. Mild mitral annular calcification. Moderate mitral valve regurgitation, with centrally-directed jet. Tricuspid Valve: The tricuspid valve is normal in structure. Tricuspid valve regurgitation is mild. Aortic Valve: The aortic valve is tricuspid. . There is severe thickening and severe calcifcation of the aortic valve. Aortic valve regurgitation is mild. Severe aortic stenosis is present. There is severe thickening of the aortic valve. There is severe calcifcation of the aortic valve. Aortic valve mean gradient measures 2.0 mmHg. Aortic valve peak gradient measures 4.8 mmHg. Aortic valve area, by VTI measures 1.43 cm. There is a 26 mm Edwards Medtronic, stented (TAVR) valve present in the aortic position. Procedure Date: 01/18/2020. Pulmonic Valve: The pulmonic valve was grossly normal. Pulmonic valve regurgitation is not visualized. Aorta: The aortic root, ascending aorta, aortic arch and descending aorta are all structurally normal, with no evidence of dilitation or obstruction. There is mild (Grade II) atheroma plaque. IAS/Shunts: No atrial level shunt detected by color flow Doppler. Additional Comments: A pacer wire is visualized.  LEFT VENTRICLE PLAX 2D  LVOT diam:     2.06 cm LV SV:         29 LV SV Index:   17 LVOT Area:     3.33 cm  AORTIC VALVE AV Area (Vmax):    1.72 cm AV Area (Vmean):   1.88 cm AV Area (VTI):     1.43 cm AV Vmax:           109.77 cm/s AV Vmean:          65.193 cm/s AV VTI:            0.202 m AV Peak Grad:      4.8 mmHg AV Mean Grad:       2.0 mmHg LVOT Vmax:         56.50 cm/s LVOT Vmean:        36.749 cm/s LVOT VTI:          0.086 m LVOT/AV VTI ratio: 0.43 TRICUSPID VALVE TV Peak grad:   47.6 mmHg TV Vmax:        3.45 m/s  SHUNTS Systemic VTI:  0.09 m Systemic Diam: 2.06 cm Garrett Fair MD Electronically signed by Garrett Fair MD Signature Date/Time: 01/18/2020/4:28:04 PM    Final    ECHOCARDIOGRAM LIMITED  Result Date: 01/19/2020    ECHOCARDIOGRAM LIMITED REPORT   Patient Name:   LAVANTE TOSO [facility] Date of Exam: 01/19/2020 Medical Rec #:  [id number]     Height:       64.0 in Accession #:    [id number]    Weight:       140.0 lb Date of Birth:  08-May-1941     BSA:          1.681 m Patient Age:    79 years      BP:           113/47 mmHg Patient Gender: M             HR:           49 bpm. Exam Location:  Inpatient Procedure: Limited Echo, Limited Color Doppler and Cardiac Doppler Indications:    Post TAVR evaluation Z95.2  History:        Patient has prior history of Echocardiogram examinations, most                 recent 01/18/2020. Prior CABG, COPD; Risk Factors:Dyslipidemia,                 Diabetes and Hypertension.                 Aortic Valve: 26 mm Edwards Sapien prosthetic, stented (TAVR)                 valve is present in the aortic position. Procedure Date:                 01/18/2020.  Sonographer:    Thurman Coyer RDCS (AE) Referring Phys: [id number] Wille Celeste THOMPSON IMPRESSIONS  1. Left ventricular ejection fraction, by estimation, is 70 to 75%. The left ventricle has hyperdynamic function. The left ventricle has no regional wall motion abnormalities. There is mild concentric left ventricular hypertrophy. Left ventricular diastolic function could not be evaluated. There is the interventricular septum is flattened in systole, consistent with right ventricular pressure overload.  2. Right ventricular systolic function is mildly reduced. The right ventricular size is moderately enlarged. There is severely elevated pulmonary artery systolic  pressure. The estimated right ventricular systolic pressure  is 85.2 mmHg.  3. The mitral valve is normal in structure. Mild to moderate mitral valve regurgitation.  4. Tricuspid valve regurgitation is moderate to severe.  5. The aortic valve has been repaired/replaced. Aortic valve regurgitation is not visualized. There is a 26 mm Edwards Sapien prosthetic (TAVR) valve present in the aortic position. Procedure Date: 01/18/2020. Echo findings are consistent with normal structure and function of the aortic valve prosthesis. Aortic valve mean gradient measures 9.3 mmHg. Aortic valve Vmax measures 2.24 m/s.  6. The inferior vena cava is dilated in size with <50% respiratory variability, suggesting right atrial pressure of 15 mmHg. Comparison(s): There is marked worsening of pulmonary artery hypertension and severity of tricuspid regurgitation since the intra-procedural study. FINDINGS  Left Ventricle: Left ventricular ejection fraction, by estimation, is 70 to 75%. The left ventricle has hyperdynamic function. The left ventricle has no regional wall motion abnormalities. The left ventricular internal cavity size was normal in size. There is mild concentric left ventricular hypertrophy. The interventricular septum is flattened in systole, consistent with right ventricular pressure overload. Left ventricular diastolic function could not be evaluated due to atrial fibrillation. Right Ventricle: The right ventricular size is moderately enlarged. No increase in right ventricular wall thickness. Right ventricular systolic function is mildly reduced. There is severely elevated pulmonary artery systolic pressure. The tricuspid regurgitant velocity is 4.19 m/s, and with an assumed right atrial pressure of 15 mmHg, the estimated right ventricular systolic pressure is 66.4 mmHg. Pericardium: There is no evidence of pericardial effusion. Mitral Valve: The mitral valve is normal in structure. Mild mitral annular calcification. Mild  to moderate mitral valve regurgitation, with centrally-directed jet. MV peak gradient, 14.1 mmHg. The mean mitral valve gradient is 4.0 mmHg. Tricuspid Valve: The tricuspid valve is normal in structure. Tricuspid valve regurgitation is moderate to severe. Aortic Valve: The aortic valve has been repaired/replaced. Aortic valve regurgitation is not visualized. Aortic valve mean gradient measures 9.3 mmHg. Aortic valve peak gradient measures 20.1 mmHg. Aortic valve area, by VTI measures 2.75 cm. There is a 26 mm Edwards Sapien prosthetic, stented (TAVR) valve present in the aortic position. Procedure Date: 01/18/2020. Echo findings are consistent with normal structure and function of the aortic valve prosthesis. Pulmonic Valve: The pulmonic valve was normal in structure. Pulmonic valve regurgitation is not visualized. Aorta: The aortic root and ascending aorta are structurally normal, with no evidence of dilitation. Venous: The inferior vena cava is dilated in size with less than 50% respiratory variability, suggesting right atrial pressure of 15 mmHg.  LEFT VENTRICLE PLAX 2D LVIDd:         4.20 cm LVIDs:         2.70 cm LV PW:         1.30 cm LV IVS:        1.30 cm LVOT diam:     2.40 cm LV SV:         129 LV SV Index:   77 LVOT Area:     4.52 cm  LEFT ATRIUM         Index LA diam:    4.30 cm 2.56 cm/m  AORTIC VALVE AV Area (Vmax):    2.83 cm AV Area (Vmean):   3.02 cm AV Area (VTI):     2.75 cm AV Vmax:           224.00 cm/s AV Vmean:          145.667 cm/s AV VTI:  0.470 m AV Peak Grad:      20.1 mmHg AV Mean Grad:      9.3 mmHg LVOT Vmax:         140.00 cm/s LVOT Vmean:        97.400 cm/s LVOT VTI:          0.286 m LVOT/AV VTI ratio: 0.61  AORTA Ao Root diam: 3.20 cm MITRAL VALVE             TRICUSPID VALVE MV Area (PHT): 1.79 cm  TR Peak grad:   70.2 mmHg MV Peak grad:  14.1 mmHg TR Vmax:        419.00 cm/s MV Mean grad:  4.0 mmHg MV Vmax:       1.88 m/s  SHUNTS MV Vmean:      81.2 cm/s Systemic  VTI:  0.29 m                          Systemic Diam: 2.40 cm Rachelle Hora Croitoru MD Electronically signed by Garrett Fair MD Signature Date/Time: 01/19/2020/5:11:08 PM    Final     Cardiac Studies   POD #1 Echo: IMPRESSIONS    1. Left ventricular ejection fraction, by estimation, is 70 to 75%. The  left ventricle has hyperdynamic function. The left ventricle has no  regional wall motion abnormalities. There is mild concentric left  ventricular hypertrophy. Left ventricular  diastolic function could not be evaluated. There is the interventricular  septum is flattened in systole, consistent with right ventricular pressure  overload.  2. Right ventricular systolic function is mildly reduced. The right  ventricular size is moderately enlarged. There is severely elevated  pulmonary artery systolic pressure. The estimated right ventricular  systolic pressure is 85.2 mmHg.  3. The mitral valve is normal in structure. Mild to moderate mitral valve  regurgitation.  4. Tricuspid valve regurgitation is moderate to severe.  5. The aortic valve has been repaired/replaced. Aortic valve  regurgitation is not visualized. There is a 26 mm Edwards Sapien  prosthetic (TAVR) valve present in the aortic position. Procedure Date:  01/18/2020. Echo findings are consistent with normal  structure and function of the aortic valve prosthesis. Aortic valve mean  gradient measures 9.3 mmHg. Aortic valve Vmax measures 2.24 m/s.  6. The inferior vena cava is dilated in size with <50% respiratory  variability, suggesting right atrial pressure of 15 mmHg.   Comparison(s): There is marked worsening of pulmonary artery hypertension  and severity of tricuspid regurgitation since the intra-procedural study.   Patient Profile     79 y.o. male with a history of CAD s/p CABG 1997 and recent PCI of RCA (12/24/19), RBBB, chronic atrial fibrillation (pt declines anticoagulation),high grade carotid artery disease,HTN,  DMT2, HLD, chronic appearing anemia/thrombocytopenia, GERD, Barrett's esophagus, h/o GIB and urinary tract bleeding and severe AS who presented to [facility] on 01/18/20 for planned TAVR.   Assessment & Plan    1.  Severe, stage D1 aortic stenosis: Patient now postoperative day #2 from TAVR via a left subclavian approach.  Normal transcatheter heart valve function on postop echo, study reviewed.  Continue aspirin and clopidogrel.  2.  Acute on chronic diastolic heart failure: Patient presented with progressive dyspnea and New York Heart Association functional class III symptoms, elevated BNP, and increasing diuretic requirement.  His postoperative day #1 echo demonstrates evidence of pulmonary hypertension and RV volume overload.  Interstitial edema noted on x-ray.  Continue IV diuretic therapy  furosemide 40 mg twice daily.  Repeat labs tomorrow morning.  3.  Altered mental status: Neuro exam seems nonfocal even though his left-sided movements are a little delayed.  He is following commands and able to move all extremities to command.  Will order a stat noncontrasted head CT.  Reviewed his medication list and he received tramadol 100 mg last night.  Will discontinue all as needed sedatives and analgesics with the exception of acetaminophen.  4.  Acute on chronic anemia: Stable, expected postoperative blood loss.  5.  Coronary artery disease with angina: Stable symptoms after atherectomy and PCI.  Continue aspirin and clopidogrel.  6.  Permanent atrial fibrillation: Since TAVR, he has been in a junctional rhythm with underlying atrial fibrillation.  He is hemodynamically stable.  Avoid all AV nodal blocking agents.  Continue to follow on telemetry.  Bifascicular block noted.  Dispo/Plan today: CT brain, IV diuretics, mobilize as able.  For questions or updates, please contact [facility] Please consult [email or web address] for contact info under     Signed, [name], MD  01/20/2020, 10:53 AM

## 2020-01-20 NOTE — Progress Notes (Signed)
CARDIAC REHAB PHASE I   PRE:  Rate/Rhythm: 50 SB  BP:  Sitting: 133/45      SaO2: 97 2L --> 94 RA  Went to walk with pt. Pt drowsy but arouseable. Pt able to state he is in the hospital for surgery. During conversation, pt would pause and sats would drop to mid 80s. Pt then woke and sats would again rise. Pt assist x2 to edge of bed. Pt then assisted to stand. Pt not able to hold himself erect and kept leaning back. Pt assisted back into bed and RN made aware. Pt denies pain. Pt placed back on 2L [place]. Will continue to follow as able.  [id number] Reynold Bowen, RN BSN 01/20/2020 10:11 AM

## 2020-01-21 ENCOUNTER — Inpatient Hospital Stay (HOSPITAL_COMMUNITY): Payer: Medicare Other

## 2020-01-21 LAB — GLUCOSE, CAPILLARY
Glucose-Capillary: 193 mg/dL — ABNORMAL HIGH (ref 70–99)
Glucose-Capillary: 94 mg/dL (ref 70–99)
Glucose-Capillary: 216 mg/dL — ABNORMAL HIGH (ref 70–99)
Glucose-Capillary: 164 mg/dL — ABNORMAL HIGH (ref 70–99)

## 2020-01-21 LAB — CBC
HCT: 29.4 % — ABNORMAL LOW (ref 39.0–52.0)
Hemoglobin: 9.6 g/dL — ABNORMAL LOW (ref 13.0–17.0)
MCH: 31.2 pg (ref 26.0–34.0)
MCHC: 32.7 g/dL (ref 30.0–36.0)
MCV: 95.5 fL (ref 80.0–100.0)
Platelets: 153 10*3/uL (ref 150–400)
RBC: 3.08 MIL/uL — ABNORMAL LOW (ref 4.22–5.81)
RDW: 16.1 % — ABNORMAL HIGH (ref 11.5–15.5)
WBC: 13.5 10*3/uL — ABNORMAL HIGH (ref 4.0–10.5)
nRBC: 0 % (ref 0.0–0.2)

## 2020-01-21 LAB — BASIC METABOLIC PANEL
Anion gap: 13 (ref 5–15)
BUN: 18 mg/dL (ref 8–23)
CO2: 23 mmol/L (ref 22–32)
Calcium: 8.3 mg/dL — ABNORMAL LOW (ref 8.9–10.3)
Chloride: 98 mmol/L (ref 98–111)
Creatinine, Ser: 1.06 mg/dL (ref 0.61–1.24)
GFR calc Af Amer: >60 mL/min (ref >60)
GFR calc non Af Amer: >60 mL/min (ref >60)
Glucose, Bld: 212 mg/dL — ABNORMAL HIGH (ref 70–99)
Potassium: 3.6 mmol/L (ref 3.5–5.1)
Sodium: 134 mmol/L — ABNORMAL LOW (ref 135–145)

## 2020-01-21 NOTE — Evaluation (Signed)
Physical Therapy Evaluation Patient Details Name: Garrett Hughes MRN: [id number] DOB: 12-Aug-1941 Today's Date: 01/21/2020   History of Present Illness  Pt s/p TAVR for severe aortic stenosis. Post op confusion. CT of head negative. PMH - CABG, DM, afib, cad, chf, copd, cad, bil rotator cuff repair  Clinical Impression  Pt presents to PT with significant deficits in mobility, cognition, and balance. Pt will verbalize and interact when stimulated but quickly back to sleep when not stimulated. Even when aroused pt with significant rt lean and decr function and awareness of LUE. Unless pt has a dramatic change expect he will need significant post acute rehab.     Follow Up Recommendations SNF;Supervision/Assistance - 24 hour    Equipment Recommendations  Other (comment)(to be determined)    Recommendations for Other Services       Precautions / Restrictions Precautions Precautions: Fall      Mobility  Bed Mobility Overal bed mobility: Needs Assistance Bed Mobility: Supine to Sit;Sit to Supine     Supine to sit: Total assist;HOB elevated Sit to supine: +2 for physical assistance;Total assist   General bed mobility comments: Assist for all aspects.   Transfers                 General transfer comment: Did not attempt  Ambulation/Gait                Stairs            Wheelchair Mobility    Modified Rankin (Stroke Patients Only)       Balance Overall balance assessment: Needs assistance Sitting-balance support: Bilateral upper extremity supported;Feet supported Sitting balance-Leahy Scale: Poor Sitting balance - Comments: Pt sat EOB x 12 minutes with mod assist. Frequent verbal/tactile cues to correct rt lean and come to midline Postural control: Right lateral lean                                   Pertinent Vitals/Pain Pain Assessment: Faces Faces Pain Scale: Hurts even more Pain Location: neck Pain Descriptors / Indicators:  Grimacing;Guarding Pain Intervention(s): Limited activity within patient's tolerance;Monitored during session;Repositioned    Home Living Family/patient expects to be discharged to:: Private residence Living Arrangements: Spouse/significant other;Other relatives Available Help at Discharge: Family;Available 24 hours/day Type of Home: House Home Access: Stairs to enter Entrance Stairs-Rails: Left Entrance Stairs-Number of Steps: 6 Home Layout: Two level;Able to live on main level with bedroom/bathroom        Prior Function Level of Independence: Independent               Hand Dominance        Extremity/Trunk Assessment   Upper Extremity Assessment Upper Extremity Assessment: Defer to OT evaluation    Lower Extremity Assessment Lower Extremity Assessment: Generalized weakness;Difficult to assess due to impaired cognition    Cervical / Trunk Assessment Cervical / Trunk Assessment: Kyphotic;Other exceptions(forward head)  Communication   Communication: HOH  Cognition Arousal/Alertness: Lethargic(awakens to stimulation) Behavior During Therapy: Flat affect Overall Cognitive Status: Impaired/Different from baseline Area of Impairment: Orientation;Following commands;Safety/judgement;Problem solving;Attention                 Orientation Level: Disoriented to;Place;Time;Situation Current Attention Level: Sustained   Following Commands: Follows one step commands inconsistently;Follows one step commands with increased time Safety/Judgement: Decreased awareness of safety;Decreased awareness of deficits   Problem Solving: Slow processing;Decreased initiation;Difficulty sequencing;Requires verbal cues;Requires tactile cues  General Comments: Pt with eyes closed intermittently. Decr awareness of LUE.      General Comments      Exercises     Assessment/Plan    PT Assessment Patient needs continued PT services  PT Problem List Decreased strength;Decreased  activity tolerance;Decreased balance;Decreased mobility;Decreased cognition;Decreased safety awareness       PT Treatment Interventions DME instruction;Gait training;Functional mobility training;Therapeutic activities;Therapeutic exercise;Balance training;Neuromuscular re-education;Cognitive remediation;Patient/family education    PT Goals (Current goals can be found in the Care Plan section)  Acute Rehab PT Goals Patient Stated Goal: get better PT Goal Formulation: With patient/family Time For Goal Achievement: 02/04/20 Potential to Achieve Goals: Fair    Frequency Min 3X/week   Barriers to discharge Inaccessible home environment stairs to enter    Co-evaluation               AM-PAC PT "6 Clicks" Mobility  Outcome Measure Help needed turning from your back to your side while in a flat bed without using bedrails?: Total Help needed moving from lying on your back to sitting on the side of a flat bed without using bedrails?: Total Help needed moving to and from a bed to a chair (including a wheelchair)?: Total Help needed standing up from a chair using your arms (e.g., wheelchair or bedside chair)?: Total Help needed to walk in hospital room?: Total Help needed climbing 3-5 steps with a railing? : Total 6 Click Score: 6    End of Session   Activity Tolerance: Patient limited by fatigue;Patient limited by lethargy Patient left: in bed;with call bell/phone within reach;with bed alarm set;with family/visitor present(bed in chair position) Nurse Communication: Mobility status PT Visit Diagnosis: Other abnormalities of gait and mobility (R26.89);Muscle weakness (generalized) (M62.81);Other symptoms and signs involving the nervous system (R29.898)    Time: 7371-0626 PT Time Calculation (min) (ACUTE ONLY): 22 min   Charges:   PT Evaluation $PT Eval Moderate Complexity: [place] Pager [phone] Office  [place] 01/21/2020, 5:16 PM

## 2020-01-21 NOTE — Progress Notes (Signed)
CARDIAC REHAB PHASE I   PRE:  Rate/Rhythm: 56 JR    BP: sitting 128/50    SaO2: 94 2L, dropped to 55 2L with apparent apnea in bed  MODE:  Ambulation: stood   POST:  Rate/Rhythm: 56    BP: sitting 141/58     SaO2: 97 2L  Pt continues with somnalence. SaO2 down to 50s with appropriate pleth form, ? apnea. Returned to 90s after 1 min.  Responds to questions but does not open eyes. He is unable to lift his left arm. When asked to lift left arm, he lifts right arm repeatedly.  Assist x2 max to roll to side. He initially could not sit up but once we moved him max assist x2 completely to edge of bed with feet on floor, he required mod assist to be supported.  Max assist x2 to stand with gait belt, pt holding to right side arm of therapist. He could lift right foot but was unable to pick up left foot. Stood approximately 1 min holding to therapist then max assist to return to bed. Pt is somewhat aware, not opening eyes. Wife present. Pt need PT/OT eval. [id number]  Harriet Masson CES, ACSM 01/21/2020 11:23 AM

## 2020-01-21 NOTE — Progress Notes (Addendum)
[facility]   MULTIDISCIPLINARY HEART VALVE TEAM  Patient Name: Garrett Hughes Date of Encounter: 01/21/2020  Primary Cardiologist: Dr. Jens Som / Dr. Excell Seltzer & Dr. Cornelius Moras (TAVR)  Hospital Problem List     Principal Problem:   S/P TAVR (transcatheter aortic valve replacement) Active Problems:   Benign essential hypertension   History of Barrett's esophagus   Hx of CABG   Hyperlipemia   Low back pain   Type 2 diabetes mellitus with kidney complication, without long-term current use of insulin (HCC)   GERD (gastroesophageal reflux disease)   Chronic atrial fibrillation (HCC)   CAD (coronary artery disease)   Acute on chronic diastolic CHF (congestive heart failure) (HCC)   Severe aortic stenosis   Carotid artery disease (HCC)   COPD (chronic obstructive pulmonary disease) (HCC)     Subjective   Confused. Wearing mittens. Follows commands. Left arm weak. No complaints   Inpatient Medications    Scheduled Meds: . aspirin  81 mg Oral Daily  . Chlorhexidine Gluconate Cloth  6 each Topical Daily  . clopidogrel  75 mg Oral Daily  . ferrous sulfate  325 mg Oral Q breakfast  . furosemide  60 mg Intravenous Q12H  . insulin aspart  0-24 Units Subcutaneous TID AC & HS  . insulin aspart  5 Units Subcutaneous TID WC  . insulin glargine  5 Units Subcutaneous Daily  . lisinopril  5 mg Oral Daily  . pantoprazole  20 mg Oral Daily  . pravastatin  40 mg Oral Daily  . sodium chloride flush  3 mL Intravenous Q12H   Continuous Infusions: . sodium chloride    . sodium chloride Stopped (01/20/20 0135)   PRN Meds: sodium chloride, acetaminophen **OR** acetaminophen, ondansetron (ZOFRAN) IV, sodium chloride flush   Vital Signs    Vitals:   01/21/20 0033 01/21/20 0428 01/21/20 0435 01/21/20 0730  BP:  (!) 160/86  (!) 151/64  Pulse: (!) 57 (!) 57  60  Resp: 19 18  20   Temp: 97.6 F (36.4 C) 97.9 F (36.6 C)  98.8 F (37.1 C)  TempSrc: Oral Oral  Axillary  SpO2: 95%  96%  95%  Weight:   63.3 kg   Height:        Intake/Output Summary (Last 24 hours) at 01/21/2020 1120 Last data filed at 01/21/2020 0809 Gross per 24 hour  Intake 1150 ml  Output 2875 ml  Net -1725 ml   Filed Weights   01/18/20 0904 01/20/20 0339 01/21/20 0435  Weight: 63.5 kg 65.2 kg 63.3 kg    Physical Exam   GEN: chronically ill appearing, sedated HEENT: Grossly normal.  Neck: Supple, no JVD, carotid bruits, or masses. Cardiac: irreg irreg, no murmurs, rubs, or gallops. No clubbing, cyanosis, edema.   Respiratory:  Respirations regular and unlabored, clear to auscultation bilaterally. GI: Soft, nontender, nondistended, BS + x 4. MS: no deformity or atrophy. Skin: warm and dry, no rash. Neuro:  Oriented to time and location. Left arm with significant weakness although he is able to move it when asked Psych: AAOx3.  Normal affect.  Labs    CBC Recent Labs    01/20/20 0407 01/21/20 0407  WBC 15.9* 13.5*  HGB 10.1* 9.6*  HCT 30.6* 29.4*  MCV 95.3 95.5  PLT 164 153   Basic Metabolic Panel Recent Labs    01/23/20 0453 01/19/20 0453 01/20/20 0407 01/21/20 0407  NA 134*   < > 135 134*  K 3.9   < >  4.3 3.6  CL 101   < > 103 98  CO2 20*   < > 19* 23  GLUCOSE 226*   < > 153* 212*  BUN 17   < > 21 18  CREATININE 1.13   < > 1.22 1.06  CALCIUM 8.2*   < > 8.3* 8.3*  MG 1.1*  --  1.9  --    < > = values in this interval not displayed.   Liver Function Tests No results for input(s): AST, ALT, ALKPHOS, BILITOT, PROT, ALBUMIN in the last 72 hours. No results for input(s): LIPASE, AMYLASE in the last 72 hours. Cardiac Enzymes No results for input(s): CKTOTAL, CKMB, CKMBINDEX, TROPONINI in the last 72 hours. BNP Invalid input(s): POCBNP D-Dimer No results for input(s): DDIMER in the last 72 hours. Hemoglobin A1C No results for input(s): HGBA1C in the last 72 hours. Fasting Lipid Panel No results for input(s): CHOL, HDL, LDLCALC, TRIG, CHOLHDL, LDLDIRECT in the  last 72 hours. Thyroid Function Tests No results for input(s): TSH, T4TOTAL, T3FREE, THYROIDAB in the last 72 hours.  Invalid input(s): FREET3  Telemetry    afib with bradycardia and junctional rhythm  - Personally Reviewed  ECG    Junctional rhythm, RBBB, LAFB  - Personally Reviewed  Radiology    CT HEAD WO CONTRAST  Result Date: 01/20/2020 CLINICAL DATA:  Altered mental status. EXAM: CT HEAD WITHOUT CONTRAST TECHNIQUE: Contiguous axial images were obtained from the base of the skull through the vertex without intravenous contrast. COMPARISON:  Head CT scan 12/28/2019 FINDINGS: Brain: No evidence of acute infarction, hemorrhage, hydrocephalus, extra-axial collection or mass lesion/mass effect. There is some cortical atrophy and chronic microvascular ischemic disease, unchanged. Vascular: Atherosclerosis. Skull: Intact.  No focal lesion. Sinuses/Orbits: Status post maxillary antrostomy and ethmoidectomy. Mild mucosal thickening right maxillary sinus noted. Other: None. IMPRESSION: No acute abnormality. Electronically Signed   By: Drusilla Kanner M.D.   On: 01/20/2020 13:38   DG Chest Port 1V same Day  Result Date: 01/21/2020 CLINICAL DATA:  Shortness of breath EXAM: PORTABLE CHEST 1 VIEW COMPARISON:  Jan 18, 2020 FINDINGS: There is slight bibasilar atelectasis. Lungs otherwise are clear. There is cardiomegaly with pulmonary vascularity normal. Patient is status post coronary artery bypass grafting and aortic valve replacement. No adenopathy. Surgical clips noted in the left upper chest region. No evident pneumothorax. No bone lesions. There is aortic atherosclerosis. IMPRESSION: Slight bibasilar atelectasis. No edema or airspace opacity evident. Stable cardiomegaly with postoperative changes. Aortic Atherosclerosis (ICD10-I70.0). Electronically Signed   By: Bretta Bang III M.D.   On: 01/21/2020 07:58    Cardiac Studies   TAVR OPERATIVE NOTE   Date of  Procedure:01/18/2020  Preoperative Diagnosis:Severe Aortic Stenosis   Postoperative Diagnosis:Same   Procedure:   Transcatheter Aortic Valve Replacement - Left Trans-SubclavianApproach Edwards Sapien 3 UltraTHV (size 82mm, model # [id number], serial # [id number])  Co-Surgeons:Clarence H. Cornelius Moras, MD and [name], MD  Anesthesiologist:David Noreene Larsson, MD  Echocardiographer:Mihai Croitoru, MD  Pre-operative Echo Findings: ? Severe aortic stenosis ? Normalleft ventricular systolic function  Post-operative Echo Findings: ? Noparavalvular leak ? Normalleft ventricular systolic function  _______________   Echo5/12/21: IMPRESSIONS  1. Left ventricular ejection fraction, by estimation, is 70 to 75%. The left ventricle has hyperdynamic function. The left ventricle has no regional wall motion abnormalities. There is mild concentric left  ventricular hypertrophy. Left ventricular diastolic function could not be evaluated. There is the interventricular septum is flattened in systole, consistent with right ventricular pressure overload.  2. Right ventricular systolic function is mildly reduced. The right  ventricular size is moderately enlarged. There is severely elevated  pulmonary artery systolic pressure. The estimated right ventricular  systolic pressure is 85.2 mmHg.  3. The mitral valve is normal in structure. Mild to moderate mitral valve regurgitation.  4. Tricuspid valve regurgitation is moderate to severe.  5. The aortic valve has been repaired/replaced. Aortic valve  regurgitation is not visualized. There is a 26 mm Edwards Sapien  prosthetic (TAVR) valve present in the aortic position. Procedure Date:  01/18/2020. Echo findings are consistent with normal  structure and function of the aortic valve prosthesis. Aortic valve mean  gradient measures 9.3  mmHg. Aortic valve Vmax measures 2.24 m/s.  6. The inferior vena cava is dilated in size with <50% respiratory  variability, suggesting right atrial pressure of 15 mmHg.   Comparison(s): There is marked worsening of pulmonary artery hypertension and severity of tricuspid regurgitation since the intra-procedural study.   Patient Profile     Garrett Hughes is a 79 y.o. male with a history of CAD s/p CABG 1997 and recent PCI of RCA (12/24/19), RBBB, chronic atrial fibrillation (pt declines anticoagulation),high grade carotid artery disease,HTN, DMT2, HLD, chronic appearing anemia/thrombocytopenia, GERD, Barrett's esophagus, h/o GIB and urinary tract bleeding and severe AS who presented to [facility] on 01/18/20 for planned TAVR.  Assessment & Plan   Severe AS:s/p successful TAVR with a47mm Edwards Sapien 3 THV via the left subclavianapproach on 01/18/20. Operative course complicated by balloon rupture during valve deployment and axillary artery damage, this was successfully repaired and there was no significant vascular injury.Post operative echo showed EF 70%, normally functioning TAVR with a mean gradient of 9.3 mm Hg and no PVL. There was mod-severe TR and pulm HTN. Groin site/subclavian siteare stable. ECG with afib and old RBBB/LAFB. There isno high grade heart block. Continue Asprin andplavix. He will need to remain inpatient. Possible discharge this weekend.   Acute on chronic anemia: he was transfused 1U PRBCS. Hg stable at 9.6  Acute on chronic diastolic CHF: patient presented with progressive dyspnea and New York Heart Association functional class III symptoms, elevated BNP, and increasing diuretic requirement. His postoperative day #1 echo demonstrates evidence of pulmonary hypertension and RV volume overload. There as interstitial edema noted on x-ray. He is being treated with IV lasix 60mg  daily. Weight down 1 lbs and net neg positive 514 mL. Creat stable at 1.06. Will continue IV lasix  for now.   CAD:s/p complex atherectomy and PCIoncomplicated by dissection/contained perforation treated with overlapping DES and a Papyrus covered stenton 12/24/19. No recurrent angina. Continue ASA and plavix(long term).  Chronic afib: since TAVR, he has been in a junctional rhythm with underlying atrial fibrillation. He has a baseline bifascicular block.He is hemodynamically stable. Avoid all AV nodal blocking agents; home Toprol XL discontinued.   DMT2: treated with SSI while admitted. Resume home regimen at discharge  HTN: BP well controlled.   Carotid artery disease: 80-99% RICA stenosis. Followed by Dr. 12/26/19 ( possible TCAR candidate).  Altered mental status: head CT normal. Likely secondary to tramadol use.   Left arm weakness: likely 2/2 significant vascular/nerve damage from subclavian artery approach and endothelial damage. Will order PT consult   Signed, Myra Gianotti, PA-C  01/21/2020, 11:20 AM  Pager [phone]  Patient seen, examined. Available data reviewed. Agree with findings, assessment, and plan as outlined by [phone], PA-C.  The patient is independently interviewed and examined this morning.  He  is alert, oriented, in no distress.  He has some deficits with following commands.  He was oriented to person, place, birthdate, address, and time upon my questioning.  His eyes are closed during our discussion, but he ultimately opened his eyes and was able to track on command.  Heart is regular rate and rhythm with a 2/6 early peaking ejection murmur at the right upper sternal border, lungs are clear, bilateral groin sites are clear, there is no pretibial edema.  The patient has equal strength in his legs.  His left arm is weak and he is unable to lift it above his head.  CT scan of the brain yesterday showed no stroke.  I suspect he has a brachial plexus injury.  Physical therapy will be consulted.  Otherwise we will continue his current medical program as  outlined above.  Try to mobilize as much as possible.  Sherren Mocha, M.D. 01/21/2020 5:04 PM

## 2020-01-22 ENCOUNTER — Inpatient Hospital Stay (HOSPITAL_COMMUNITY): Payer: Medicare Other

## 2020-01-22 DIAGNOSIS — I639 Cerebral infarction, unspecified: Secondary | ICD-10-CM

## 2020-01-22 DIAGNOSIS — I6521 Occlusion and stenosis of right carotid artery: Secondary | ICD-10-CM

## 2020-01-22 DIAGNOSIS — I4819 Other persistent atrial fibrillation: Secondary | ICD-10-CM

## 2020-01-22 LAB — GLUCOSE, CAPILLARY
Glucose-Capillary: 203 mg/dL — ABNORMAL HIGH (ref 70–99)
Glucose-Capillary: 185 mg/dL — ABNORMAL HIGH (ref 70–99)
Glucose-Capillary: 191 mg/dL — ABNORMAL HIGH (ref 70–99)
Glucose-Capillary: 146 mg/dL — ABNORMAL HIGH (ref 70–99)

## 2020-01-22 LAB — CBC
HCT: 31.3 % — ABNORMAL LOW (ref 39.0–52.0)
Hemoglobin: 10 g/dL — ABNORMAL LOW (ref 13.0–17.0)
MCH: 30.6 pg (ref 26.0–34.0)
MCHC: 31.9 g/dL (ref 30.0–36.0)
MCV: 95.7 fL (ref 80.0–100.0)
Platelets: 185 10*3/uL (ref 150–400)
RBC: 3.27 MIL/uL — ABNORMAL LOW (ref 4.22–5.81)
RDW: 15.6 % — ABNORMAL HIGH (ref 11.5–15.5)
WBC: 12.8 10*3/uL — ABNORMAL HIGH (ref 4.0–10.5)
nRBC: 0 % (ref 0.0–0.2)

## 2020-01-22 LAB — BASIC METABOLIC PANEL
Anion gap: 12 (ref 5–15)
BUN: 23 mg/dL (ref 8–23)
CO2: 26 mmol/L (ref 22–32)
Calcium: 8.4 mg/dL — ABNORMAL LOW (ref 8.9–10.3)
Chloride: 98 mmol/L (ref 98–111)
Creatinine, Ser: 1.26 mg/dL — ABNORMAL HIGH (ref 0.61–1.24)
GFR calc Af Amer: >60 mL/min (ref >60)
GFR calc non Af Amer: 54 mL/min — ABNORMAL LOW (ref >60)
Glucose, Bld: 217 mg/dL — ABNORMAL HIGH (ref 70–99)
Potassium: 3.5 mmol/L (ref 3.5–5.1)
Sodium: 136 mmol/L (ref 135–145)

## 2020-01-22 MED ORDER — STROKE: EARLY STAGES OF RECOVERY BOOK
Freq: Once | Status: AC
  Administered 2020-01-22: 1
  Filled 2020-01-22: qty 1

## 2020-01-22 MED ORDER — GADOBUTROL 1 MMOL/ML IV SOLN
6.0000 mL | Freq: Once | INTRAVENOUS | Status: AC | PRN
  Administered 2020-01-22: 6 mL via INTRAVENOUS

## 2020-01-22 NOTE — Consult Note (Addendum)
NEURO HOSPITALIST CONSULT NOTE   Requesting physician: Dr. Burt Knack   Reason for Consult: AMS/   History obtained from: chart review and family   HPI:                                                                                                                                          Garrett Hughes is an 79 y.o. male  With PMH DM 2, CABG (2017), HLD, chronic a fib, COPD who presented to [facility] for TAVR (5/11). Neurology consulted 5/15 for continued AMS and left arm weakness.  Per family patient is usually awake, alert and able to perform ADL's independently. He is avery independent person. Since the surgery the patient has been confused. The [place] did not show anything acute.   Past Medical History:  Diagnosis Date  . Anemia   . Arthritis   . Atherosclerosis of native coronary artery of native heart without angina pectoris 01/05/2016  . Benign essential hypertension 01/05/2016  . Carotid artery disease ([place])   . Cataract, nuclear, right 05/06/2014  . Chronic atrial fibrillation ([place]) 12/20/2015  . Chronic diarrhea   . COPD (chronic obstructive pulmonary disease) (HCC)    on nocturnal O2  . Current use of long term anticoagulation 12/20/2015  . Diabetic kidney disease ([place]) 01/05/2016  . GERD (gastroesophageal reflux disease)   . GI bleed   . History of Barrett's esophagus 12/20/2015  . History of blood in urine   . History of kidney stones   . 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 Hyperlipemia 10/26/2015  . Low back pain 05/02/2016  . S/P TAVR (transcatheter aortic valve replacement) 01/18/2020   s/p successful TAVR w/ a 26 mm Edwards S3U via the left subclavian approach (failed Shockwave TF) with Dr. Burt Knack and Dr. Roxy Manns   . Severe aortic stenosis   . Thrombocytopenia ([place])   . Type 2 diabetes mellitus with kidney complication, without long-term current use of insulin ([place]) 07/03/2016    Past Surgical History:  Procedure  Laterality Date  . AORTOGRAM  01/18/2020   Procedure: Aortogram;  Surgeon: Sherren Mocha, MD;  Location: [place];  Service: Open Heart Surgery;;  . BACK SURGERY  2004  . [place]  . CORONARY ARTERY BYPASS GRAFT  1997  . CORONARY ATHERECTOMY N/A 12/24/2019   Procedure: CORONARY ATHERECTOMY;  Surgeon: Sherren Mocha, MD;  Location: [place] CV LAB;  Service: Cardiovascular;  Laterality: N/A;  . INTRAOPERATIVE TRANSESOPHAGEAL ECHOCARDIOGRAM  01/18/2020   Procedure: Intraoperative Transesophageal Echocardiogram;  Surgeon: Sherren Mocha, MD;  Location: [place];  Service: Open Heart Surgery;;  . INTRAOPERATIVE TRANSTHORACIC ECHOCARDIOGRAM N/A 01/18/2020   Procedure: Intraoperative Transthoracic Echocardiogram;  Surgeon: Sherren Mocha, MD;  Location: [facility] OR;  Service: Open Heart Surgery;  Laterality: N/A;  . LOWER EXTREMITY ANGIOGRAM Bilateral 01/18/2020   Procedure: LOWER EXTREMITY ANGIOGRAM with intravascular shockwave lithotripsy;  Surgeon: [name], MD;  Location: [facility] OR;  Service: Cardiovascular;  Laterality: Bilateral;  . RIGHT HEART CATH AND CORONARY/GRAFT ANGIOGRAPHY N/A 11/26/2019   Procedure: RIGHT HEART CATH AND CORONARY/GRAFT ANGIOGRAPHY;  Surgeon: [name], MD;  Location: [facility] INVASIVE CV LAB;  Service: Cardiovascular;  Laterality: N/A;  . SHOULDER SURGERY  2005  . TRANSCATHETER AORTIC VALVE REPLACEMENT, TRANSFEMORAL Left 01/18/2020   Procedure: Transcatheter Aortic Valve Replacement (subclavian approach);  Surgeon: [name], MD;  Location: [facility] OR;  Service: Open Heart Surgery;  Laterality: Left;    Family History  Problem Relation Age of Onset  . CAD Father   . Colon cancer Neg Hx   . Heart attack Neg Hx             Social History:  reports that he quit smoking about 32 years ago. He has never used smokeless tobacco. He reports that he does not drink alcohol or use drugs.  Allergies  Allergen Reactions  . Atorvastatin     Other reaction(s): Myalgias  (intolerance)  . Cholestyramine Other (See Comments)    Constipation  . Pravastatin Other (See Comments)    Does not tolerate high doses    MEDICATIONS:                                                                                                                     Scheduled: . aspirin  81 mg Oral Daily  . Chlorhexidine Gluconate Cloth  6 each Topical Daily  . clopidogrel  75 mg Oral Daily  . ferrous sulfate  325 mg Oral Q breakfast  . furosemide  60 mg Intravenous Q12H  . insulin aspart  0-24 Units Subcutaneous TID AC & HS  . insulin aspart  5 Units Subcutaneous TID WC  . insulin glargine  5 Units Subcutaneous Daily  . lisinopril  5 mg Oral Daily  . pantoprazole  20 mg Oral Daily  . pravastatin  40 mg Oral Daily  . sodium chloride flush  3 mL Intravenous Q12H   Continuous: . sodium chloride    . sodium chloride Stopped (01/20/20 0135)   HWE:XHBZJI chloride, acetaminophen **OR** acetaminophen, ondansetron (ZOFRAN) IV, sodium chloride flush   ROS:  unobtainable from patient due to mental status     Blood pressure (!) 112/57, pulse (!) 49, temperature 97.7 F (36.5 C), temperature source Oral, resp. rate 13, height 5\' 4"  (1.626 m), weight 60.8 kg, SpO2 100 %.   General Examination:                                                                                                       Physical Exam  Constitutional: Appears well-developed and well-nourished.  Psych: Affect appropriate to situation Eyes: Normal external eye and conjunctiva. HENT: Normocephalic, no lesions, without obvious abnormality.   Musculoskeletal-no joint tenderness, deformity or swelling Cardiovascular: Normal rate and regular rhythm.  Respiratory: Effort normal, non-labored breathing saturations WNL GI: Soft.  No distension. There is no tenderness.  Skin:  WDI  Neurological Examination Mental Status: Alert, keeps his eyes tightly closed, oriented to name and age. Said year was 2002. Able to follow some simple commands. When asked to do things with his left side patient was very reluctant and findings may be effort dependent. Patient also follows commands a lot slower with the left side. Cranial Nerves: Patient keeps eyes tightly shut. PERRL.tongue protrudes midline. Face appears symmetric. LT sensation intact. Motor: RUE: able to lift anti-gravity Right Bicep 5/5    Left bicep 4-/5 Right triceps 5/5  Left triceps 0/5 Right hand grips 5/5  Left hand grip 4-/5 Right interossei 5/5    Left interossei patient would/ cold not follow directions. He just kept squeezing.  RLE: 5/5 LLE: 4-/5 (pain dependent effort) Tone and bulk:normal tone throughout; no atrophy noted Sensory: light touch intact throughout, bilaterally Deep Tendon Reflexes: 2+ and symmetric biceps and patella Cerebellar: Unable to formally assess. Gait: deferred  Lab Results: Basic Metabolic Panel: Recent Labs  Lab 01/18/20 1425 01/18/20 1425 01/18/20 1429 01/19/20 0453 01/19/20 0453 01/20/20 0407 01/21/20 0407 01/22/20 0325  NA 139   < > 140 134*  --  135 134* 136  K 3.6   < > 3.5 3.9  --  4.3 3.6 3.5  CL 100  --   --  101  --  103 98 98  CO2  --   --   --  20*  --  19* 23 26  GLUCOSE 162*  --   --  226*  --  153* 212* 217*  BUN 15  --   --  17  --  21 18 23   CREATININE 0.60*  --   --  1.13  --  1.22 1.06 1.26*  CALCIUM  --   --   --  8.2*   < > 8.3* 8.3* 8.4*  MG  --   --   --  1.1*  --  1.9  --   --    < > = values in this interval not displayed.    CBC: Recent Labs  Lab 01/18/20 2001 01/19/20 0453 01/20/20 0407 01/21/20 0407 01/22/20 0325  WBC  --  18.4* 15.9* 13.5* 12.8*  HGB 9.9* 10.3* 10.1* 9.6* 10.0*  HCT 29.7* 30.9* 30.6* 29.4* 31.3*  MCV  --  94.5 95.3 95.5 95.7  PLT  --  189 164 153 185   Imaging: CT HEAD WO CONTRAST  Result Date:  01/20/2020 CLINICAL DATA:  Altered mental status. EXAM: CT HEAD WITHOUT CONTRAST TECHNIQUE: Contiguous axial images were obtained from the base of the skull through the vertex without intravenous contrast. COMPARISON:  Head CT scan 12/28/2019 FINDINGS: Brain: No evidence of acute infarction, hemorrhage, hydrocephalus, extra-axial collection or mass lesion/mass effect. There is some cortical atrophy and chronic microvascular ischemic disease, unchanged. Vascular: Atherosclerosis. Skull: Intact.  No focal lesion. Sinuses/Orbits: Status post maxillary antrostomy and ethmoidectomy. Mild mucosal thickening right maxillary sinus noted. Other: None. IMPRESSION: No acute abnormality. Electronically Signed   By: Drusilla Kanner M.D.   On: 01/20/2020 13:38   DG Chest Port 1V same Day  Result Date: 01/21/2020 CLINICAL DATA:  Shortness of breath EXAM: PORTABLE CHEST 1 VIEW COMPARISON:  Jan 18, 2020 FINDINGS: There is slight bibasilar atelectasis. Lungs otherwise are clear. There is cardiomegaly with pulmonary vascularity normal. Patient is status post coronary artery bypass grafting and aortic valve replacement. No adenopathy. Surgical clips noted in the left upper chest region. No evident pneumothorax. No bone lesions. There is aortic atherosclerosis. IMPRESSION: Slight bibasilar atelectasis. No edema or airspace opacity evident. Stable cardiomegaly with postoperative changes. Aortic Atherosclerosis (ICD10-I70.0). Electronically Signed   By: Bretta Bang III M.D.   On: 01/21/2020 07:58    Valentina Lucks, MSN, NP-C [facility] [phone]  Attending neurologist's note to follow   01/22/2020, 12:03 PM  I have seen the patient and reviewed the above note.    On my entering, he does not open his eyes, however when I speak, he responds and is clearly awake despite keeping his eyes closed (eyelid apraxia).  He has significant left neglect including motor neglect and therefore has inconsistent  effort on motor exam, but does appear to have 4/5 strength on the left arm and leg.  He extinguishes to double simultaneous stimulation of both visual and tactile stimuli, and he did sometimes senses stimuli on the left to be actually occurring on the right.  Assessment: 79 y.o. male  With PMH DM 2, CABG (2017), HLD, chronic a fib, COPD who presented to [facility] for TAVR (5/11).  He now has mild left hemiparesis and fairly severe left hemineglect.  This is consistent with an acute ischemic infarct.  Recommendations: --MRI brain --Lipid panel, A1c --PT, OT, ST --Stroke team to follow-up   Ritta Slot, MD [facility] [phone]  If 7pm- 7am, please page neurology on call as listed in AMION.

## 2020-01-22 NOTE — Progress Notes (Signed)
CARDIAC REHAB PHASE I   Spoke with pts nurse. Pt unable to keep his eyes open today and L arm is weaker. Will f/u Monday.  Reynold Bowen, RN BSN 01/22/2020 10:40 AM

## 2020-01-22 NOTE — Progress Notes (Signed)
Patient Name: Garrett Hughes Date of Encounter: 01/22/2020  Primary Cardiologist: Dr. Jens Hughes / Dr. Excell Hughes & Dr. Cornelius Hughes (TAVR)   Subjective   Speech still garbled Spoke with wife and son   Inpatient Medications    Scheduled Meds: . aspirin  81 mg Oral Daily  . Chlorhexidine Gluconate Cloth  6 each Topical Daily  . clopidogrel  75 mg Oral Daily  . ferrous sulfate  325 mg Oral Q breakfast  . furosemide  60 mg Intravenous Q12H  . insulin aspart  0-24 Units Subcutaneous TID AC & HS  . insulin aspart  5 Units Subcutaneous TID WC  . insulin glargine  5 Units Subcutaneous Daily  . lisinopril  5 mg Oral Daily  . pantoprazole  20 mg Oral Daily  . pravastatin  40 mg Oral Daily  . sodium chloride flush  3 mL Intravenous Q12H   Continuous Infusions: . sodium chloride    . sodium chloride Stopped (01/20/20 0135)   PRN Meds: sodium chloride, acetaminophen **OR** acetaminophen, ondansetron (ZOFRAN) IV, sodium chloride flush   Vital Signs    Vitals:   01/22/20 0422 01/22/20 0750 01/22/20 1000 01/22/20 1118  BP: (!) 170/67 (!) 158/49  (!) 112/57  Pulse: (!) 48 (!) 51 (!) 49 (!) 49  Resp: 19 12 18 13   Temp: 97.7 F (36.5 C) 98 F (36.7 C)  97.7 F (36.5 C)  TempSrc: Oral Oral  Oral  SpO2: 96% 99% 100% 100%  Weight: 60.8 kg     Height:        Intake/Output Summary (Last 24 hours) at 01/22/2020 1334 Last data filed at 01/22/2020 0830 Gross per 24 hour  Intake 360 ml  Output 1800 ml  Net -1440 ml   Filed Weights   01/20/20 0339 01/21/20 0435 01/22/20 0422  Weight: 65.2 kg 63.3 kg 60.8 kg    Physical Exam   Elderly male SEM through AVR no AR Lungs clear  Mild left hand weakness Speech garbled  Right carotid bruit   Labs    CBC Recent Labs    01/21/20 0407 01/22/20 0325  WBC 13.5* 12.8*  HGB 9.6* 10.0*  HCT 29.4* 31.3*  MCV 95.5 95.7  PLT 153 185   Basic Metabolic Panel Recent Labs    01/24/20 0407 01/20/20 0407 01/21/20 0407 01/22/20 0325  NA 135    < > 134* 136  K 4.3   < > 3.6 3.5  CL 103   < > 98 98  CO2 19*   < > 23 26  GLUCOSE 153*   < > 212* 217*  BUN 21   < > 18 23  CREATININE 1.22   < > 1.06 1.26*  CALCIUM 8.3*   < > 8.3* 8.4*  MG 1.9  --   --   --    < > = values in this interval not displayed.     Telemetry    afib with bradycardia and junctional rhythm    ECG    Junctional rhythm, RBBB, LAFB  - Personally Reviewed  Radiology    DG Chest Port 1V same Day  Result Date: 01/21/2020 CLINICAL DATA:  Shortness of breath EXAM: PORTABLE CHEST 1 VIEW COMPARISON:  Jan 18, 2020 FINDINGS: There is slight bibasilar atelectasis. Lungs otherwise are clear. There is cardiomegaly with pulmonary vascularity normal. Patient is status post coronary artery bypass grafting and aortic valve replacement. No adenopathy. Surgical clips noted in the left upper chest region. No evident pneumothorax.  No bone lesions. There is aortic atherosclerosis. IMPRESSION: Slight bibasilar atelectasis. No edema or airspace opacity evident. Stable cardiomegaly with postoperative changes. Aortic Atherosclerosis (ICD10-I70.0). Electronically Signed   By: Garrett Hughes M.D.   On: 01/21/2020 07:58    Cardiac Studies   TAVR OPERATIVE NOTE   Date of Procedure:01/18/2020  Preoperative Diagnosis:Severe Aortic Stenosis   Postoperative Diagnosis:Same   Procedure:   Transcatheter Aortic Valve Replacement - Left Trans-SubclavianApproach Garrett Hughes 3 UltraTHV (size 17mm, model # [id number], serial # [id number])  Co-Surgeons:Garrett H. Cornelius Moras, MD and Garrett Bollman, MD  Anesthesiologist:Garrett Noreene Larsson, MD  Echocardiographer:Garrett Croitoru, MD  Pre-operative Echo Findings: ? Severe aortic stenosis ? Normalleft ventricular systolic function  Post-operative Echo Findings: ? Noparavalvular leak ? Normalleft ventricular systolic  function  _______________   Echo5/12/21: IMPRESSIONS  1. Left ventricular ejection fraction, by estimation, is 70 to 75%. The left ventricle has hyperdynamic function. The left ventricle has no regional wall motion abnormalities. There is mild concentric left  ventricular hypertrophy. Left ventricular diastolic function could not be evaluated. There is the interventricular septum is flattened in systole, consistent with right ventricular pressure overload.  2. Right ventricular systolic function is mildly reduced. The right  ventricular size is moderately enlarged. There is severely elevated  pulmonary artery systolic pressure. The estimated right ventricular  systolic pressure is 85.2 mmHg.  3. The mitral valve is normal in structure. Mild to moderate mitral valve regurgitation.  4. Tricuspid valve regurgitation is moderate to severe.  5. The aortic valve has been repaired/replaced. Aortic valve  regurgitation is not visualized. There is a 26 mm Garrett Hughes  prosthetic (TAVR) valve present in the aortic position. Procedure Date:  01/18/2020. Echo findings are consistent with normal  structure and function of the aortic valve prosthesis. Aortic valve mean  gradient measures 9.3 mmHg. Aortic valve Vmax measures 2.24 m/s.  6. The inferior vena cava is dilated in size with <50% respiratory  variability, suggesting right atrial pressure of 15 mmHg.   Comparison(s): There is marked worsening of pulmonary artery hypertension and severity of tricuspid regurgitation since the intra-procedural study.   Patient Profile     [name] is a 79 y.o. male with a history of CAD s/p CABG 1997 and recent PCI of RCA (12/24/19), RBBB, chronic atrial fibrillation (pt declines anticoagulation),high grade carotid artery disease,HTN, DMT2, HLD, chronic appearing anemia/thrombocytopenia, GERD, Barrett's esophagus, h/o GIB and urinary tract bleeding and severe AS who presented to [facility] on 01/18/20  for planned TAVR.  Assessment & Plan   Severe KK:XFGH TAVR with 26 mm Hughes 3 valve 5/11 mean gradient 9 mmHg no PVL  Acute on chronic anemia: he was transfused 1U PRBCS. Hb 10.0 today stable   Acute on chronic diastolic CHF:  negative 1440 from post op echo appears to have significant pulmonary HTN at risk for right heart failure    CAD:s/p complex atherectomy and PCIoncomplicated by dissection/contained perforation treated with overlapping DES and a Papyrus covered stenton 12/24/19. No recurrent angina. Continue ASA and plavix(long term). This may be an issue if he has had a stroke and needs anticoagulation given his age PAF and anemia  Chronic afib: since TAVR, he has been in a junctional rhythm with underlying atrial fibrillation. He has a baseline bifascicular block.He is hemodynamically stable. Avoid all AV nodal blocking agents; home Toprol XL discontinued. Hemodynamically stable   DMT2: treated with SSI while admitted. Resume home regimen at discharge  HTN: BP well controlled.   Carotid artery disease:  80-99% RICA stenosis. Followed by Dr. Trula Slade ( possible TCAR candidate).  ? Stroke:  Multiple possibilities with recent TAVR ( calcific/atherosclerotic emboli) PAF not on anticoagulation and high grade RICA stenosis Dr Roxy Manns has ordered MRI CT scan was ok Neuro has been notified by Dr Roxy Manns If stoke seen and no hemorrhage/bleeding would probably stop plavix and Rx with eliquis and ASA Hopefully neurology will help guide this    Signed, Jenkins Rouge, MD

## 2020-01-22 NOTE — Progress Notes (Signed)
      [address]       [place] [address]             [phone]     CARDIOTHORACIC SURGERY PROGRESS NOTE  4 Days Post-Op  S/P Procedure(s) (LRB): LOWER EXTREMITY ANGIOGRAM with intravascular shockwave lithotripsy (Bilateral) Intraoperative Transthoracic Echocardiogram (N/A) Intraoperative Transesophageal Echocardiogram Transcatheter Aortic Valve Replacement (subclavian approach) (Left) Aortogram  Subjective: Still lethargic.  Won't open eyes.  Follows commands.  Very weak left arm but will squeeze fingers.  Swallowing well but not eating much  Objective: Vital signs in last 24 hours: Temp:  [97.5 F (36.4 C)-98.2 F (36.8 C)] 97.7 F (36.5 C) (05/15 1118) Pulse Rate:  [48-53] 49 (05/15 1118) Cardiac Rhythm: Junctional rhythm (05/15 0855) Resp:  [12-20] 13 (05/15 1118) BP: (112-170)/(49-99) 112/57 (05/15 1118) SpO2:  [96 %-100 %] 100 % (05/15 1118) Weight:  [60.8 kg] 60.8 kg (05/15 0422)  Physical Exam:  Rhythm:   junctional  Breath sounds: clear  Heart sounds:  RRR w/ systolic murmur  Incisions:  Clean and dry  Abdomen:  Soft, non-distended, non-tender  Extremities:  Warm, well-perfused   Intake/Output from previous day: 05/14 0701 - 05/15 0700 In: 120 [P.O.:120] Out: 2600 [Urine:2600] Intake/Output this shift: Total I/O In: 240 [P.O.:240] Out: -   Lab Results: Recent Labs    01/21/20 0407 01/22/20 0325  WBC 13.5* 12.8*  HGB 9.6* 10.0*  HCT 29.4* 31.3*  PLT 153 185   BMET:  Recent Labs    01/21/20 0407 01/22/20 0325  NA 134* 136  K 3.6 3.5  CL 98 98  CO2 23 26  GLUCOSE 212* 217*  BUN 18 23  CREATININE 1.06 1.26*  CALCIUM 8.3* 8.4*    CBG (last 3)  Recent Labs    01/21/20 1634 01/21/20 2129 01/22/20 0615  GLUCAP 216* 164* 203*   PT/INR:  No results for input(s): LABPROT, INR in the last 72 hours.  CXR:  N/A  Assessment/Plan: S/P Procedure(s) (LRB): LOWER EXTREMITY ANGIOGRAM with intravascular shockwave  lithotripsy (Bilateral) Intraoperative Transthoracic Echocardiogram (N/A) Intraoperative Transesophageal Echocardiogram Transcatheter Aortic Valve Replacement (subclavian approach) (Left) Aortogram  I remains concerned that Mr Mimbs may have suffered a stroke despite negative CT head.  Left arm weakness could be due to brachial plexus injury, but cognitive deficit persists.  Will order MRI brain and ask stroke team to see   Purcell Nails, MD 01/22/2020 11:39 AM

## 2020-01-22 NOTE — Evaluation (Signed)
Occupational Therapy Evaluation Patient Details Name: Garrett Hughes MRN: [id number] DOB: 01/25/1941 Today's Date: 01/22/2020    History of Present Illness Pt s/p TAVR for severe aortic stenosis. Post op confusion. CT of head negative. PMH - CABG, DM, afib, cad, chf, copd, cad, bil rotator cuff repair   Clinical Impression   Pt presents with decline in function and safety with ADLs and ADL mobility with impaired strength, balance, endurance and cognition. Pt live sat home with his wife and was independent with ADLs/selfcare, used no AD for mobility and could drive. Pt currently requires max - total A with all ADLs/selfcare. Pt with eyes open intermittently and was able to attempt long sitting in bed with max A . Pt responded to all questions even when eyes were closed. Pt's wife present and in agreement with post acute rehab before pt returns home. Pt would benefit from acute OT services to address impairments to maximize level of function and safety    Follow Up Recommendations  SNF    Equipment Recommendations  Other (comment)(TBD at next venue of care)    Recommendations for Other Services       Precautions / Restrictions Precautions Precautions: Fall Restrictions Weight Bearing Restrictions: No      Mobility Bed Mobility               General bed mobility comments: unable; pt able to attempt long sitting in bed max A  Transfers                 General transfer comment: Did not attempt    Balance Overall balance assessment: (unable to assess at this time)                                         ADL either performed or assessed with clinical judgement   ADL Overall ADL's : Needs assistance/impaired Eating/Feeding: Total assistance   Grooming: Wash/dry face;Maximal assistance Grooming Details (indicate cue type and reason): hand over hand assist Upper Body Bathing: Total assistance   Lower Body Bathing: Total assistance   Upper Body  Dressing : Total assistance   Lower Body Dressing: Total assistance     Toilet Transfer Details (indicate cue type and reason): unable Toileting- Clothing Manipulation and Hygiene: Total assistance;Bed level         General ADL Comments: pt able to attempt long sitting in bed max A     Vision Baseline Vision/History: Wears glasses Patient Visual Report: (unable to properly asses at this time)       Perception     Praxis      Pertinent Vitals/Pain Pain Assessment: Faces Faces Pain Scale: Hurts little more Pain Location: L shoulder Pain Descriptors / Indicators: Grimacing;Guarding Pain Intervention(s): Limited activity within patient's tolerance;Monitored during session;Repositioned     Hand Dominance Right   Extremity/Trunk Assessment Upper Extremity Assessment Upper Extremity Assessment: Generalized weakness;LUE deficits/detail   Lower Extremity Assessment Lower Extremity Assessment: Defer to PT evaluation       Communication Communication Communication: HOH   Cognition Arousal/Alertness: Lethargic(eyes opend upon arrival, but closing/opening throughout session) Behavior During Therapy: Flat affect Overall Cognitive Status: Impaired/Different from baseline Area of Impairment: Orientation;Following commands;Safety/judgement;Problem solving;Attention                 Orientation Level: Disoriented to;Place;Time;Situation     Following Commands: Follows one step commands inconsistently;Follows one step commands  with increased time Safety/Judgement: Decreased awareness of safety;Decreased awareness of deficits   Problem Solving: Slow processing;Decreased initiation;Difficulty sequencing;Requires verbal cues;Requires tactile cues General Comments: Pt with eyes closed intermittently.   General Comments       Exercises     Shoulder Instructions      Home Living Family/patient expects to be discharged to:: Private residence Living Arrangements:  Spouse/significant other;Other relatives Available Help at Discharge: Family;Available 24 hours/day Type of Home: House Home Access: Stairs to enter [employer] of Steps: 6 Entrance Stairs-Rails: Left Home Layout: Two level;Able to live on main level with bedroom/bathroom                          Prior Functioning/Environment Level of Independence: Independent                 OT Problem List: Decreased strength;Impaired balance (sitting and/or standing);Decreased cognition;Decreased knowledge of precautions;Pain;Decreased safety awareness;Decreased activity tolerance;Decreased coordination;Decreased knowledge of use of DME or AE      OT Treatment/Interventions: Self-care/ADL training;Manual therapy;Therapeutic exercise;Balance training;Therapeutic activities;Neuromuscular education;DME and/or AE instruction;Cognitive remediation/compensation    OT Goals(Current goals can be found in the care plan section) Acute Rehab OT Goals Patient Stated Goal: get better OT Goal Formulation: With patient/family Time For Goal Achievement: 02/05/20 Potential to Achieve Goals: Good ADL Goals Pt Will Perform Grooming: with mod assist;sitting Pt Will Perform Upper Body Bathing: with max assist;with mod assist;sitting Pt Will Perform Upper Body Dressing: with max assist;with mod assist;sitting Pt Will Transfer to Toilet: with max assist;stand pivot transfer;bedside commode Additional ADL Goal #1: pt will complete bed mobility max A to sit EOB with max/mod A for balance support for simple grooming and UB ADL tasks  OT Frequency: Min 2X/week   Barriers to D/C:            Co-evaluation              AM-PAC OT "6 Clicks" Daily Activity     Outcome Measure Help from another person eating meals?: Total Help from another person taking care of personal grooming?: A Lot Help from another person toileting, which includes using toliet, bedpan, or urinal?: Total Help from  another person bathing (including washing, rinsing, drying)?: Total Help from another person to put on and taking off regular upper body clothing?: Total Help from another person to put on and taking off regular lower body clothing?: Total 6 Click Score: 7   End of Session    Activity Tolerance: Patient limited by lethargy Patient left: in bed;with call bell/phone within reach;with bed alarm set;with family/visitor present  OT Visit Diagnosis: Other abnormalities of gait and mobility (R26.89);Muscle weakness (generalized) (M62.81);Other symptoms and signs involving cognitive function;Pain Pain - Right/Left: Left Pain - part of body: Shoulder                Time: 6222-9798 OT Time Calculation (min): 34 min Charges:  OT General Charges $OT Visit: 1 Visit OT Evaluation $OT Eval Moderate Complexity: 1 Mod   Galen Manila 01/22/2020, 12:31 PM

## 2020-01-23 ENCOUNTER — Inpatient Hospital Stay (HOSPITAL_COMMUNITY): Payer: Medicare Other

## 2020-01-23 ENCOUNTER — Encounter: Payer: Self-pay | Admitting: Cardiovascular Disease

## 2020-01-23 DIAGNOSIS — E782 Mixed hyperlipidemia: Secondary | ICD-10-CM

## 2020-01-23 DIAGNOSIS — I25110 Atherosclerotic heart disease of native coronary artery with unstable angina pectoris: Secondary | ICD-10-CM

## 2020-01-23 DIAGNOSIS — I6522 Occlusion and stenosis of left carotid artery: Secondary | ICD-10-CM

## 2020-01-23 DIAGNOSIS — E119 Type 2 diabetes mellitus without complications: Secondary | ICD-10-CM

## 2020-01-23 DIAGNOSIS — E1122 Type 2 diabetes mellitus with diabetic chronic kidney disease: Secondary | ICD-10-CM

## 2020-01-23 DIAGNOSIS — I350 Nonrheumatic aortic (valve) stenosis: Secondary | ICD-10-CM

## 2020-01-23 DIAGNOSIS — I10 Essential (primary) hypertension: Secondary | ICD-10-CM

## 2020-01-23 DIAGNOSIS — Z951 Presence of aortocoronary bypass graft: Secondary | ICD-10-CM

## 2020-01-23 DIAGNOSIS — I482 Chronic atrial fibrillation, unspecified: Secondary | ICD-10-CM

## 2020-01-23 DIAGNOSIS — I6340 Cerebral infarction due to embolism of unspecified cerebral artery: Secondary | ICD-10-CM

## 2020-01-23 LAB — GLUCOSE, CAPILLARY
Glucose-Capillary: 195 mg/dL — ABNORMAL HIGH (ref 70–99)
Glucose-Capillary: 120 mg/dL — ABNORMAL HIGH (ref 70–99)
Glucose-Capillary: 171 mg/dL — ABNORMAL HIGH (ref 70–99)
Glucose-Capillary: 172 mg/dL — ABNORMAL HIGH (ref 70–99)
Glucose-Capillary: 219 mg/dL — ABNORMAL HIGH (ref 70–99)

## 2020-01-23 LAB — LIPID PANEL
Cholesterol: 141 mg/dL (ref 0–200)
HDL: 51 mg/dL (ref >40)
LDL Cholesterol: 73 mg/dL (ref 0–99)
Total CHOL/HDL Ratio: 2.8 RATIO
Triglycerides: 83 mg/dL (ref <150)
VLDL: 17 mg/dL (ref 0–40)

## 2020-01-23 LAB — BASIC METABOLIC PANEL
Anion gap: 13 (ref 5–15)
BUN: 23 mg/dL (ref 8–23)
CO2: 31 mmol/L (ref 22–32)
Calcium: 8.8 mg/dL — ABNORMAL LOW (ref 8.9–10.3)
Chloride: 93 mmol/L — ABNORMAL LOW (ref 98–111)
Creatinine, Ser: 1.16 mg/dL (ref 0.61–1.24)
GFR calc Af Amer: >60 mL/min (ref >60)
GFR calc non Af Amer: 60 mL/min — ABNORMAL LOW (ref >60)
Glucose, Bld: 193 mg/dL — ABNORMAL HIGH (ref 70–99)
Potassium: 3.2 mmol/L — ABNORMAL LOW (ref 3.5–5.1)
Sodium: 137 mmol/L (ref 135–145)

## 2020-01-23 LAB — CBC
HCT: 32.4 % — ABNORMAL LOW (ref 39.0–52.0)
Hemoglobin: 10.7 g/dL — ABNORMAL LOW (ref 13.0–17.0)
MCH: 31.2 pg (ref 26.0–34.0)
MCHC: 33 g/dL (ref 30.0–36.0)
MCV: 94.5 fL (ref 80.0–100.0)
Platelets: 224 10*3/uL (ref 150–400)
RBC: 3.43 MIL/uL — ABNORMAL LOW (ref 4.22–5.81)
RDW: 15.5 % (ref 11.5–15.5)
WBC: 10.9 10*3/uL — ABNORMAL HIGH (ref 4.0–10.5)
nRBC: 0 % (ref 0.0–0.2)

## 2020-01-23 LAB — HEMOGLOBIN A1C
Hgb A1c MFr Bld: 6.6 % — ABNORMAL HIGH (ref 4.8–5.6)
Mean Plasma Glucose: 142.72 mg/dL

## 2020-01-23 MED ORDER — ASPIRIN EC 325 MG PO TBEC: 325.0000 mg | DELAYED_RELEASE_TABLET | Freq: Every day | ORAL | Status: DC

## 2020-01-23 MED ORDER — IOHEXOL 350 MG/ML SOLN
75.0000 mL | Freq: Once | INTRAVENOUS | Status: AC | PRN
  Administered 2020-01-23: 75 mL via INTRAVENOUS

## 2020-01-23 MED ORDER — POTASSIUM CHLORIDE CRYS ER 20 MEQ PO TBCR: 40.0000 meq | EXTENDED_RELEASE_TABLET | Freq: Once | ORAL | Status: DC

## 2020-01-23 MED ORDER — ENOXAPARIN SODIUM 40 MG/0.4ML ~~LOC~~ SOLN
40.0000 mg | SUBCUTANEOUS | Status: DC
  Administered 2020-01-23: 40 mg via SUBCUTANEOUS
  Filled 2020-01-23: qty 0.4

## 2020-01-23 MED ORDER — POTASSIUM CHLORIDE CRYS ER 20 MEQ PO TBCR
40.0000 meq | EXTENDED_RELEASE_TABLET | Freq: Two times a day (BID) | ORAL | Status: AC
  Administered 2020-01-23 (×2): 40 meq via ORAL
  Filled 2020-01-23 (×2): qty 2

## 2020-01-23 MED ORDER — ASPIRIN EC 325 MG PO TBEC
325.0000 mg | DELAYED_RELEASE_TABLET | Freq: Every day | ORAL | Status: DC
  Administered 2020-01-24 – 2020-01-28 (×5): 325 mg via ORAL
  Filled 2020-01-23 (×5): qty 1

## 2020-01-23 NOTE — Progress Notes (Signed)
STROKE TEAM PROGRESS NOTE   INTERVAL HISTORY His wife and Dr. Roxy Manns are at the bedside.  Patient lying in bed, awake alert, only orientated to people but not orientated to place and time.  Still has left arm weakness, similar as yesterday as per wife.  His left leg much improved and strong.  CT head and neck showed right ICA occlusion, left ICA 80% stenosis both progressed from carotid Doppler in 11/2019.  He also had calcified plaque new at basilar tip, could be embolic from procedure.  OBJECTIVE Vitals:   01/23/20 0500 01/23/20 0556 01/23/20 0824 01/23/20 1110  BP:   (!) 153/50 (!) 147/48  Pulse:   (!) 47 (!) 37  Resp: 20 15 19 20   Temp:   97.6 F (36.4 C) 97.7 F (36.5 C)  TempSrc:   Oral Oral  SpO2:   90% 99%  Weight:  58.6 kg    Height:        CBC:  Recent Labs  Lab 01/22/20 0325 01/23/20 0536  WBC 12.8* 10.9*  HGB 10.0* 10.7*  HCT 31.3* 32.4*  MCV 95.7 94.5  PLT 185 829    Basic Metabolic Panel:  Recent Labs  Lab 01/19/20 0453 01/19/20 0453 01/20/20 0407 01/21/20 0407 01/22/20 0325 01/23/20 0536  NA 134*   < > 135   < > 136 137  K 3.9   < > 4.3   < > 3.5 3.2*  CL 101   < > 103   < > 98 93*  CO2 20*   < > 19*   < > 26 31  GLUCOSE 226*   < > 153*   < > 217* 193*  BUN 17   < > 21   < > 23 23  CREATININE 1.13   < > 1.22   < > 1.26* 1.16  CALCIUM 8.2*   < > 8.3*   < > 8.4* 8.8*  MG 1.1*  --  1.9  --   --   --    < > = values in this interval not displayed.    Lipid Panel:     Component Value Date/Time   CHOL 141 01/23/2020 0536   TRIG 83 01/23/2020 0536   HDL 51 01/23/2020 0536   CHOLHDL 2.8 01/23/2020 0536   VLDL 17 01/23/2020 0536   LDLCALC 73 01/23/2020 0536   LDLCALC 67 09/21/2019 0922   HgbA1c:  Lab Results  Component Value Date   HGBA1C 6.6 (H) 01/23/2020   Urine Drug Screen: No results found for: LABOPIA, COCAINSCRNUR, LABBENZ, AMPHETMU, THCU, LABBARB  Alcohol Level No results found for: [facility]  IMAGING  MR BRAIN W WO  CONTRAST 01/22/2020 IMPRESSION:  1. Numerous small acute infarcts in the right greater than left cerebral hemispheres and left cerebellum compatible with a central embolic source.  2. 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   CTA Head and Neck - pending 01/23/2020  Transthoracic Echocardiogram (Limited) 5/12//2021 IMPRESSIONS  1. Left ventricular ejection fraction, by estimation, is 70 to 75%. The  left ventricle has hyperdynamic function. The left ventricle has no  regional wall motion abnormalities. There is mild concentric left  ventricular hypertrophy. Left ventricular  diastolic function could not be evaluated. There is the interventricular  septum is flattened in systole, consistent with right ventricular pressure  overload.  2. Right ventricular systolic function is mildly reduced. The right  ventricular size is moderately enlarged. There is severely elevated  pulmonary artery systolic pressure. The estimated  right ventricular  systolic pressure is 85.2 mmHg.  3. The mitral valve is normal in structure. Mild to moderate mitral valve  regurgitation.  4. Tricuspid valve regurgitation is moderate to severe.  5. The aortic valve has been repaired/replaced. Aortic valve  regurgitation is not visualized. There is a 26 mm Edwards Sapien  prosthetic (TAVR) valve present in the aortic position. Procedure Date:  01/18/2020. Echo findings are consistent with normal  structure and function of the aortic valve prosthesis. Aortic valve mean  gradient measures 9.3 mmHg. Aortic valve Vmax measures 2.24 m/s.  6. The inferior vena cava is dilated in size with <50% respiratory  variability, suggesting right atrial pressure of 15 mmHg.  Comparison(s): There is marked worsening of pulmonary artery hypertension  and severity of tricuspid regurgitation since the intra-procedural study.   ECG - Junctional rhythm with background of atrial fibrillation - ventricular response 50  BPM (See cardiology reading for complete details)  PHYSICAL EXAM  Temp:  [97.6 F (36.4 C)-97.9 F (36.6 C)] 97.7 F (36.5 C) (05/16 1110) Pulse Rate:  [37-54] 37 (05/16 1110) Resp:  [15-20] 20 (05/16 1110) BP: (124-155)/(48-88) 147/48 (05/16 1110) SpO2:  [90 %-99 %] 99 % (05/16 1110) Weight:  [58.6 kg] 58.6 kg (05/16 0556)  General - Well nourished, well developed, mildly agitated due to urinary retention.  Ophthalmologic - fundi not visualized due to noncooperation.  Cardiovascular - Regular rhythm and rate.  Neuro - awake, alert, oriented to people only, not oriented to place, age or time.  Visual field testing showed no hemianopia however, questionable left simultanagnosia.  No gaze palsy, PERRL, tracking bilaterally.  Slight left nasolabial fold flattening.  Tongue midline.  Right upper extremity at least 4/5, left upper extremity 2/5 deltoid, 3/5 bicep and tricep, 3+/5 finger grip.  Bilateral lower extremity 3/5 proximal, 4/5 knee flexion, 4/5 foot DF on the right and 3+/5 on the left.  DTR 1+, no Babinski.  Sensation symmetrical.  Finger-to-nose slow on the right but grossly intact.  Left finger-nose very difficult to perform but no gross ataxia.   ASSESSMENT/PLAN [name] is a 79 y.o. male with history of DM 2, CABG (2017), HLD, chronic a fib,COPD who presented to [facility] for [facility]AVR (5/11). Neurology was consulted 5/15 for continued post op AMS and left arm weakness.  He did not receive IV t-PA due to recent surgery and late presentation (>4.5 hours from time of onset).  Stroke: Numerous small acute infarcts in the right greater than left cerebral hemispheres and left cerebellum compatible with a central embolic source. Etiology could be multiple, large vessel occlusion/stenosis in the setting of procedure/low BP vs. TAVR procedure related vs. Chronic afib not on AC  CT head - no acute abnormality  MRI head - Numerous small acute infarcts in the right greater than left  cerebral hemispheres and left cerebellum compatible with a central embolic source.   CTA H&N - right ICA occlusion at origin, severe stenosis of proximal left ICA, >80% stenosis.  Bilateral VA origin stenosis, 70% on the right, 50% on the left.  Dense calcification plaque at the basilar tip, new from previous CT, with bilateral stenosis of P1 origins.  TEE 5/11 EF 50 to 55%, no PFO, no LA or LAA thrombus  2D Echo 5/12 - EF 70 to 75%. No cardiac source of emboli identified.   Sars Corona Virus 2 - negative  LDL - 73  HgbA1c - 6.6  VTE prophylaxis - Lovenox  aspirin 81 mg  daily and clopidogrel 75 mg daily prior to admission, now on aspirin 325 mg daily and clopidogrel 75 mg daily.  Patient counseled to be compliant with his antithrombotic medications  Ongoing aggressive stroke risk factor management  Therapy recommendations:  SNF   Disposition:  Pending  Carotid occlusion/stenosis  12/08/2019 followed with vascular surgery, carotid Doppler at that time right ICA 80 to 90%, left ICA 40- 59% stenosis.  This admission CTA head and neck showed right ICA occlusion and left ICA more than 80% stenosis  Repeat carotid Doppler pending  On aspirin 325 and Plavix DAPT  Avoid low BP  Long-term BP goal 130-160  Basilar tip calcified plaque  Seen on current CT and CTA head and neck, but not on CT in 12/2019  Concerning for embolic plaque, likely related to recent cardiac procedure  Resulted bilateral P1 stenosis but no flow-limiting  Continue DAPT  Avoid low BP  Chronic PAF  Not on AC given history of GI bleeding and urinary tract bleeding  Currently on DAPT  Further regimen per cardiology  Hypertension  Home BP meds: Lisinopril ; metoprolol; cardura  Current BP meds: Lisinopril 5 and Lasix 60 twice daily  Stable . Long-term BP goal 130-160  Hyperlipidemia  Home Lipid lowering medication: Pravastatin 40 mg daily  LDL 73, goal < 70  Current lipid lowering  medication: Pravastatin 40 mg daily  Continue statin at discharge  Intolerant to high dose statins  Diabetes  Home diabetic meds: Glucotrol ; metformin  Current diabetic meds: Lantus  HgbA1c 6.6, goal < 7.0  SSI  CBG monitoring  PCP follow-up  Other Stroke Risk Factors  Advanced age  Former cigarette smoker - quit  Coronary artery disease status post CABG in 2017  Severe AS s/p TAVR   Other Active Problems  Code status - Full code  Bradycardia - junctional rhythm 30's to 40's  Leukocytosis - 12.8->10.7  Hypokalemia - 3.5->3.2 supplement  AKI - creatinine - 1.26->1.16  Hospital day # 5  I spent  35 minutes in total face-to-face time with the patient, more than 50% of which was spent in counseling and coordination of care, reviewing test results, images and medication, and discussing the diagnosis of embolic shower, carotid stenosis occlusion, basilar A and PCA stenosis, afib and BP management, treatment plan and potential prognosis. This patient's care requiresreview of multiple databases, neurological assessment, discussion with wife, other specialists and medical decision making of high complexity. I also discussed with Dr. Cornelius Moras.   Marvel Plan, MD PhD Stroke Neurology 01/23/2020 12:27 PM   To contact Stroke Continuity provider, please refer to [email or web address]. After hours, contact General Neurology

## 2020-01-23 NOTE — Progress Notes (Signed)
CSW attempted to reach wife about SNF recommendation and had to leave a VM.

## 2020-01-23 NOTE — Progress Notes (Signed)
  Speech Language Pathology Treatment: Cognitive-Linquistic  Patient Details Name: Garrett Hughes MRN: [id number] DOB: 08-09-1941 Today's Date: 01/23/2020 Time: 3500-9381 SLP Time Calculation (min) (ACUTE ONLY): 18 min  Assessment / Plan / Recommendation Clinical Impression  Pt was seen for cognitive-linguistic treatment with his wife present. Pt was cooperative during the session with encouragement from this SLP and his wife. He demonstrated 60% accuracy with simple reasoning tasks increasing to 100% with verbal prompts and phonemic cues. He was re-oriented to time and situation but required cues for subsequent recall of this information. He achieved 50% accuracy with problem solving related to safety with verbal prompts for attention. Pt continually attempted to independently get out of bed during the session. He was able to demonstrate how to call staff for assistance and verbalize the potential ramifications of his getting out of bed unassisted, but this did not result in behavior modification. SLP will continue to follow pt.    HPI HPI: Pt is a 79 y.o. male with a hx of CABG 1997, chronic atrial fibrillation (pt declines anticoagulation), aortic stenosis, carotid artery disease, HTN, DM, HLD (intolerant of higher dose of pravastatin or atorvastatin), chronic appearing anemia/thrombocytopenia, GERD, Barrett's esophagus, h/o GIB and urinary tract bleeding, who presented to the ED for evaluation of CHF and syncope. MRI brain 5/15: Numerous small acute infarcts in the right greater than left cerebral hemispheres and left cerebellum compatible with a central embolic source. Suspected occlusion of the right ICA.      SLP Plan  Continue with current plan of care  Patient needs continued Speech Lanaguage Pathology Services    Recommendations                   Follow up Recommendations: Skilled Nursing facility SLP Visit Diagnosis: Cognitive communication deficit (W29.937) Plan: Continue with  current plan of care       Garrett Hughes I. Vear Clock, MS, CCC-SLP Acute Rehabilitation Services Office number [phone] Pager [phone]               Garrett Hughes 01/23/2020, 4:36 PM

## 2020-01-23 NOTE — NC FL2 (Signed)
[place] MEDICAID FL2 LEVEL OF CARE SCREENING TOOL     IDENTIFICATION  Patient Name: Garrett Hughes Birthdate: 08-Dec-1940 Sex: male Admission Date (Current Location): 01/18/2020  [facility] and [place] Number:  Producer, television/film/video and Address:  [facility] [place], [address] [place], [place] [address]      Provider Number: [id number]  Attending Physician Name and Address:  [name], MD  Relative Name and Phone Number:  [place] (spouse)    Current Level of Care: Hospital Recommended Level of Care: Skilled Nursing Facility Prior Approval Number:    Date Approved/Denied:   PASRR Number: [id number]  Discharge Plan: SNF    Current Diagnoses: Patient Active Problem List   Diagnosis Date Noted  . S/P TAVR (transcatheter aortic valve replacement) 01/18/2020  . Carotid artery disease (HCC)   . COPD (chronic obstructive pulmonary disease) (HCC)   . Acute on chronic diastolic CHF (congestive heart failure) (HCC) 12/28/2019  . Severe aortic stenosis 12/28/2019  . CAD (coronary artery disease) 12/25/2019  . Hypocalcemia 03/26/2019  . GERD (gastroesophageal reflux disease)   . Chronic atrial fibrillation (HCC)   . 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 Type 2 diabetes mellitus with kidney complication, without long-term current use of insulin (HCC) 07/03/2016  . Low back pain 05/02/2016  . Benign essential hypertension 01/05/2016  . History of Barrett's esophagus 12/20/2015  . Hyperlipemia 10/26/2015  . Cataract, nuclear, right 05/06/2014    Orientation RESPIRATION BLADDER Height & Weight     Self, Place  Normal Incontinent, External catheter Weight: 129 lb 3 oz (58.6 kg) Height:  5\' 4"  (162.6 cm)  BEHAVIORAL SYMPTOMS/MOOD NEUROLOGICAL BOWEL NUTRITION STATUS      Continent Diet(see discharge summary)  AMBULATORY STATUS COMMUNICATION OF NEEDS Skin   Extensive Assist Verbally Other (Comment)(left and right groin closed surgical incision, left chest closed  surgical incision, ecchymosis arms and legs)                       Personal Care Assistance Level of Assistance  Bathing, Feeding, Dressing, Total care Bathing Assistance: Maximum assistance Feeding assistance: Limited assistance Dressing Assistance: Maximum assistance Total Care Assistance: Maximum assistance   Functional Limitations Info  Sight, Hearing, Speech Sight Info: Impaired Hearing Info: Impaired Speech Info: Adequate    SPECIAL CARE FACTORS FREQUENCY  PT (By licensed PT), OT (By licensed OT)     PT Frequency: min 5x weekly OT Frequency: min 5x weekly            Contractures Contractures Info: Not present    Additional Factors Info  Code Status, Allergies Code Status Info: full Allergies Info: atorvastatin, cholestyramine, pravastatin           Current Medications (01/23/2020):  This is the current hospital active medication list Current Facility-Administered Medications  Medication Dose Route Frequency Provider Last Rate Last Admin  . 0.9 %  sodium chloride infusion  250 mL Intravenous PRN 01/25/2020, PA-C      . 0.9 %  sodium chloride infusion  250 mL Intravenous Continuous Janetta Hora, PA-C   Stopped at 01/20/20 0135  . acetaminophen (TYLENOL) tablet 650 mg  650 mg Oral Q6H PRN 01/22/20, PA-C   650 mg at 01/21/20 1205   Or  . acetaminophen (TYLENOL) suppository 650 mg  650 mg Rectal Q6H PRN 01/23/20, PA-C      . aspirin chewable tablet 81 mg  81 mg Oral Daily  Eileen Stanford, PA-C   81 mg at 01/23/20 0100  . Chlorhexidine Gluconate Cloth 2 % PADS 6 each  6 each Topical Daily Eileen Stanford, PA-C   6 each at 01/20/20 7121  . clopidogrel (PLAVIX) tablet 75 mg  75 mg Oral Daily Eileen Stanford, PA-C   75 mg at 01/23/20 9758  . ferrous sulfate tablet 325 mg  325 mg Oral Q breakfast Eileen Stanford, PA-C   325 mg at 01/23/20 8325  . furosemide (LASIX) injection 60 mg  60 mg Intravenous Janalee Dane, MD   60 mg at 01/22/20 2330  . insulin aspart (novoLOG) injection 0-24 Units  0-24 Units Subcutaneous TID AC & HS Eileen Stanford, PA-C   4 Units at 01/23/20 4982  . insulin aspart (novoLOG) injection 5 Units  5 Units Subcutaneous TID WC Eileen Stanford, PA-C   5 Units at 01/21/20 1639  . insulin glargine (LANTUS) injection 5 Units  5 Units Subcutaneous Daily Eileen Stanford, PA-C   5 Units at 01/23/20 0830  . lisinopril (ZESTRIL) tablet 5 mg  5 mg Oral Daily Eileen Stanford, PA-C   5 mg at 01/23/20 6415  . ondansetron (ZOFRAN) injection 4 mg  4 mg Intravenous Q6H PRN Eileen Stanford, PA-C      . pantoprazole (PROTONIX) EC tablet 20 mg  20 mg Oral Daily Eileen Stanford, PA-C   20 mg at 01/23/20 0830  . pravastatin (PRAVACHOL) tablet 40 mg  40 mg Oral Daily Eileen Stanford, PA-C   40 mg at 01/23/20 0830  . sodium chloride flush (NS) 0.9 % injection 3 mL  3 mL Intravenous Q12H Eileen Stanford, PA-C   3 mL at 01/23/20 0830  . sodium chloride flush (NS) 0.9 % injection 3 mL  3 mL Intravenous PRN Eileen Stanford, PA-C   3 mL at 01/19/20 2206     Discharge Medications: Please see discharge summary for a list of discharge medications.  Relevant Imaging Results:  Relevant Lab Results:   Additional Information SSN: [id number]  Alberteen Sam, LCSW

## 2020-01-23 NOTE — Evaluation (Signed)
Speech Language Pathology Evaluation Patient Details Name: Garrett Hughes MRN: [id number] DOB: 12-Dec-1940 Today's Date: 01/23/2020 Time: 7253-6644 SLP Time Calculation (min) (ACUTE ONLY): 25 min  Problem List:  Patient Active Problem List   Diagnosis Date Noted  . Cerebral embolism with cerebral infarction 01/23/2020  . S/P TAVR (transcatheter aortic valve replacement) 01/18/2020  . Carotid artery disease (HCC)   . COPD (chronic obstructive pulmonary disease) (HCC)   . Acute on chronic diastolic CHF (congestive heart failure) (HCC) 12/28/2019  . Severe aortic stenosis 12/28/2019  . CAD (coronary artery disease) 12/25/2019  . Hypocalcemia 03/26/2019  . GERD (gastroesophageal reflux disease)   . Chronic atrial fibrillation (HCC)   . 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 Type 2 diabetes mellitus with kidney complication, without long-term current use of insulin (HCC) 07/03/2016  . Low back pain 05/02/2016  . Benign essential hypertension 01/05/2016  . History of Barrett's esophagus 12/20/2015  . Hyperlipemia 10/26/2015  . Cataract, nuclear, right 05/06/2014   Past Medical History:  Past Medical History:  Diagnosis Date  . Anemia   . Arthritis   . Atherosclerosis of native coronary artery of native heart without angina pectoris 01/05/2016  . Benign essential hypertension 01/05/2016  . Carotid artery disease (HCC)   . Cataract, nuclear, right 05/06/2014  . Chronic atrial fibrillation (HCC) 12/20/2015  . Chronic diarrhea   . COPD (chronic obstructive pulmonary disease) (HCC)    on nocturnal O2  . Current use of long term anticoagulation 12/20/2015  . Diabetic kidney disease (HCC) 01/05/2016  . GERD (gastroesophageal reflux disease)   . GI bleed   . History of Barrett's esophagus 12/20/2015  . History of blood in urine   . History of kidney stones   . 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 Hyperlipemia 10/26/2015  . Low back pain 05/02/2016  . S/P TAVR  (transcatheter aortic valve replacement) 01/18/2020   s/p successful TAVR w/ a 26 mm Edwards S3U via the left subclavian approach (failed Shockwave TF) with Dr. Excell Seltzer and Dr. Cornelius Moras   . Severe aortic stenosis   . Thrombocytopenia (HCC)   . Type 2 diabetes mellitus with kidney complication, without long-term current use of insulin (HCC) 07/03/2016   Past Surgical History:  Past Surgical History:  Procedure Laterality Date  . AORTOGRAM  01/18/2020   Procedure: Aortogram;  Surgeon: [name], MD;  Location: [facility] OR;  Service: Open Heart Surgery;;  . BACK SURGERY  2004  . CARDIAC SURGERY  1997  . CORONARY ARTERY BYPASS GRAFT  1997  . CORONARY ATHERECTOMY N/A 12/24/2019   Procedure: CORONARY ATHERECTOMY;  Surgeon: [name], MD;  Location: [facility] INVASIVE CV LAB;  Service: Cardiovascular;  Laterality: N/A;  . INTRAOPERATIVE TRANSESOPHAGEAL ECHOCARDIOGRAM  01/18/2020   Procedure: Intraoperative Transesophageal Echocardiogram;  Surgeon: [name], MD;  Location: [facility] OR;  Service: Open Heart Surgery;;  . INTRAOPERATIVE TRANSTHORACIC ECHOCARDIOGRAM N/A 01/18/2020   Procedure: Intraoperative Transthoracic Echocardiogram;  Surgeon: [name], MD;  Location: [facility] OR;  Service: Open Heart Surgery;  Laterality: N/A;  . LOWER EXTREMITY ANGIOGRAM Bilateral 01/18/2020   Procedure: LOWER EXTREMITY ANGIOGRAM with intravascular shockwave lithotripsy;  Surgeon: [name], MD;  Location: [facility] OR;  Service: Cardiovascular;  Laterality: Bilateral;  . RIGHT HEART CATH AND CORONARY/GRAFT ANGIOGRAPHY N/A 11/26/2019   Procedure: RIGHT HEART CATH AND CORONARY/GRAFT ANGIOGRAPHY;  Surgeon: [name], MD;  Location: [facility] INVASIVE CV LAB;  Service: Cardiovascular;  Laterality: N/A;  .  SHOULDER SURGERY  2005  . TRANSCATHETER AORTIC VALVE REPLACEMENT, TRANSFEMORAL Left 01/18/2020   Procedure: Transcatheter Aortic Valve Replacement (subclavian approach);  Surgeon: Sherren Mocha, MD;  Location: [place];   Service: Open Heart Surgery;  Laterality: Left;   HPI:  Pt is a 79 y.o. male with a hx of CABG 1997, chronic atrial fibrillation (pt declines anticoagulation), aortic stenosis, carotid artery disease, HTN, DM, HLD (intolerant of higher dose of pravastatin or atorvastatin), chronic appearing anemia/thrombocytopenia, GERD, Barrett's esophagus, h/o GIB and urinary tract bleeding, who presented to the ED for evaluation of CHF and syncope. MRI brain 5/15: Numerous small acute infarcts in the right greater than left cerebral hemispheres and left cerebellum compatible with a central embolic source. Suspected occlusion of the right ICA.   Assessment / Plan / Recommendation Clinical Impression  Pt participated in speech/language/cognition evaluation with his wife present. Pt's wife reported that it appears that the pt cannot concentrate enough to process what he is being told. Pt reported increased difficulty with memory compared to baseline. Per the wife, the pt did not have any significant baseline difficulty in cognition but she managed his medications and the finances at baseline. He demonstrated deficits in the areas of orientation, attention, awareness, reasoning, memory and problem solving. Skilled SLP services are clinically indicated at this time to improve cognition. Pt, his wife, and nursing were educated regarding results and recommendations; all parties verbalized understanding as well as agreement with plan of care.    SLP Assessment  SLP Recommendation/Assessment: Patient needs continued Speech Lanaguage Pathology Services    Follow Up Recommendations  Skilled Nursing facility    Frequency and Duration min 2x/week  2 weeks      SLP Evaluation Cognition  Overall Cognitive Status: Impaired/Different from baseline Arousal/Alertness: Awake/alert Orientation Level: Oriented to person;Oriented to place;Disoriented to time;Disoriented to situation Attention: Focused;Sustained Focused  Attention: Impaired Focused Attention Impairment: Verbal basic Sustained Attention: Impaired Sustained Attention Impairment: Verbal basic;Verbal complex Memory: Impaired Memory Impairment: Storage deficit;Retrieval deficit;Decreased recall of new information Awareness: Impaired Awareness Impairment: Emergent impairment Problem Solving: Impaired Problem Solving Impairment: Verbal complex;Functional basic Executive Function: Reasoning Reasoning: Impaired Reasoning Impairment: Verbal complex;Verbal basic Behaviors: Restless Safety/Judgment: Impaired       Comprehension  Auditory Comprehension Overall Auditory Comprehension: Appears within functional limits for tasks assessed Yes/No Questions: Impaired Basic Biographical Questions: (4/5) Complex Questions: (2/5) Paragraph Comprehension (via yes/no questions): Not tested Commands: Impaired One Step Basic Commands: (2/5) Two Step Basic Commands: (0/4) Conversation: Simple Interfering Components: Attention;Processing speed;Working Field seismologist: Extra processing time;Slowed speech;Stressing words Reading Comprehension Reading Status: Not tested    Expression Expression Primary Mode of Expression: Verbal Verbal Expression Overall Verbal Expression: Appears within functional limits for tasks assessed Initiation: No impairment Automatic Speech: Counting(WNL) Level of Generative/Spontaneous Verbalization: Conversation Repetition: No impairment Naming: No impairment Pragmatics: Impairment Impairments: Abnormal affect;Eye contact Interfering Components: Attention   Oral / Motor  Oral Motor/Sensory Function Overall Oral Motor/Sensory Function: Within functional limits Motor Speech Overall Motor Speech: Appears within functional limits for tasks assessed Respiration: Within functional limits Phonation: Normal Resonance: Within functional limits Articulation: Within functional limitis Intelligibility:  Intelligible Motor Planning: Witnin functional limits Motor Speech Errors: Not applicable   Christie Viscomi I. Hardin Negus, [place], [place] Office number [phone] Pager [phone]           Horton Marshall 01/23/2020, 4:25 PM

## 2020-01-23 NOTE — Progress Notes (Signed)
Patient Name: Garrett Hughes Date of Encounter: 01/23/2020  Primary Cardiologist: Dr. Jens Som / Dr. Excell Seltzer & Dr. Cornelius Moras (TAVR)   Subjective   MS still poor with left neglect and weakness   Inpatient Medications    Scheduled Meds: . aspirin  81 mg Oral Daily  . Chlorhexidine Gluconate Cloth  6 each Topical Daily  . clopidogrel  75 mg Oral Daily  . ferrous sulfate  325 mg Oral Q breakfast  . furosemide  60 mg Intravenous Q12H  . insulin aspart  0-24 Units Subcutaneous TID AC & HS  . insulin aspart  5 Units Subcutaneous TID WC  . insulin glargine  5 Units Subcutaneous Daily  . lisinopril  5 mg Oral Daily  . pantoprazole  20 mg Oral Daily  . pravastatin  40 mg Oral Daily  . sodium chloride flush  3 mL Intravenous Q12H   Continuous Infusions: . sodium chloride    . sodium chloride Stopped (01/20/20 0135)   PRN Meds: sodium chloride, acetaminophen **OR** acetaminophen, ondansetron (ZOFRAN) IV, sodium chloride flush   Vital Signs    Vitals:   01/23/20 0416 01/23/20 0500 01/23/20 0556 01/23/20 0824  BP: (!) 147/53   (!) 153/50  Pulse: (!) 47   (!) 47  Resp: 18 20 15 19   Temp: 97.6 F (36.4 C)   97.6 F (36.4 C)  TempSrc: Oral   Oral  SpO2: 95%   90%  Weight:   58.6 kg   Height:        Intake/Output Summary (Last 24 hours) at 01/23/2020 0954 Last data filed at 01/23/2020 0421 Gross per 24 hour  Intake 320 ml  Output 1700 ml  Net -1380 ml   Filed Weights   01/21/20 0435 01/22/20 0422 01/23/20 0556  Weight: 63.3 kg 60.8 kg 58.6 kg    Physical Exam   Elderly male SEM through AVR no AR Lungs clear  Garbled speech and left neglect with weakness  Right carotid bruit   Labs    CBC Recent Labs    01/22/20 0325 01/23/20 0536  WBC 12.8* 10.9*  HGB 10.0* 10.7*  HCT 31.3* 32.4*  MCV 95.7 94.5  PLT 185 224   Basic Metabolic Panel Recent Labs    01/25/20 0325 01/23/20 0536  NA 136 137  K 3.5 3.2*  CL 98 93*  CO2 26 31  GLUCOSE 217* 193*  BUN 23 23   CREATININE 1.26* 1.16  CALCIUM 8.4* 8.8*     Telemetry    afib with bradycardia and junctional rhythm    ECG    Junctional rhythm, RBBB, LAFB  - Personally Reviewed  Radiology    MR BRAIN W WO CONTRAST  Result Date: 01/22/2020 CLINICAL DATA:  Altered mental status, mild left hemiparesis, and left hemineglect. EXAM: MRI HEAD WITHOUT AND WITH CONTRAST TECHNIQUE: Multiplanar, multiecho pulse sequences of the brain and surrounding structures were obtained without and with intravenous contrast. CONTRAST:  78mL GADAVIST GADOBUTROL 1 MMOL/ML IV SOLN COMPARISON:  Head CT 01/20/2020 FINDINGS: Brain: There are numerous small acute infarcts involving cortex and white matter of both cerebral hemispheres, overall right greater than left and with greatest involvement of the right frontal and parietal lobes towards the vertex including in the centrum semiovale. Small acute infarcts are also present in the basal ganglia bilaterally and in the left cerebellar hemisphere. A chronic microhemorrhage is noted in the right temporal lobe. There is a chronic lacunar infarct in the right lentiform nucleus. Mild cerebral  atrophy is within normal limits for age. Postcontrast sequences are severely motion degraded. No mass, midline shift, or extra-axial fluid collection is identified. Vascular: Abnormal appearance of the right internal carotid artery. Skull and upper cervical spine: Unremarkable bone marrow signal. Sinuses/Orbits: Right cataract extraction. Mild mucosal thickening in the paranasal sinuses. Trace right mastoid fluid. Other: None. IMPRESSION: 1. Numerous small acute infarcts in the right greater than left cerebral hemispheres and left cerebellum compatible with a central embolic source. 2. 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 Electronically Signed   By: Logan Bores M.D.   On: 01/22/2020 19:20    Cardiac Studies   TAVR OPERATIVE NOTE   Date of  Procedure:01/18/2020  Preoperative Diagnosis:Severe Aortic Stenosis   Postoperative Diagnosis:Same   Procedure:   Transcatheter Aortic Valve Replacement - Left Trans-SubclavianApproach Edwards Sapien 3 UltraTHV (size 8mm, model # [id number], serial # [id number])  Co-Surgeons:Clarence H. Roxy Manns, MD and Sherren Mocha, MD  Anesthesiologist:David Linna Caprice, MD  Echocardiographer:Mihai Croitoru, MD  Pre-operative Echo Findings: ? Severe aortic stenosis ? Normalleft ventricular systolic function  Post-operative Echo Findings: ? Noparavalvular leak ? Normalleft ventricular systolic function  _______________   Echo5/12/21: IMPRESSIONS  1. Left ventricular ejection fraction, by estimation, is 70 to 75%. The left ventricle has hyperdynamic function. The left ventricle has no regional wall motion abnormalities. There is mild concentric left  ventricular hypertrophy. Left ventricular diastolic function could not be evaluated. There is the interventricular septum is flattened in systole, consistent with right ventricular pressure overload.  2. Right ventricular systolic function is mildly reduced. The right  ventricular size is moderately enlarged. There is severely elevated  pulmonary artery systolic pressure. The estimated right ventricular  systolic pressure is 09.9 mmHg.  3. The mitral valve is normal in structure. Mild to moderate mitral valve regurgitation.  4. Tricuspid valve regurgitation is moderate to severe.  5. The aortic valve has been repaired/replaced. Aortic valve  regurgitation is not visualized. There is a 26 mm Edwards Sapien  prosthetic (TAVR) valve present in the aortic position. Procedure Date:  01/18/2020. Echo findings are consistent with normal  structure and function of the aortic valve prosthesis. Aortic valve mean  gradient measures 9.3  mmHg. Aortic valve Vmax measures 2.24 m/s.  6. The inferior vena cava is dilated in size with <50% respiratory  variability, suggesting right atrial pressure of 15 mmHg.   Comparison(s): There is marked worsening of pulmonary artery hypertension and severity of tricuspid regurgitation since the intra-procedural study.   Patient Profile     DANEN LAPAGLIA is a 79 y.o. male with a history of CAD s/p CABG 1997 and recent PCI of RCA (12/24/19), RBBB, chronic atrial fibrillation (pt declines anticoagulation),high grade carotid artery disease,HTN, DMT2, HLD, chronic appearing anemia/thrombocytopenia, GERD, Barrett's esophagus, h/o GIB and urinary tract bleeding and severe AS who presented to [facility] on 01/18/20 for planned TAVR.  Assessment & Plan   Severe IP:JASN TAVR with 26 mm Sapien 3 valve 5/11 mean gradient 9 mmHg no PVL  Acute on chronic anemia: he was transfused 1U PRBCS. Hb 10.7  today stable   Acute on chronic diastolic CHF:  negative 0539 from post op echo appears to have significant pulmonary HTN at risk for right heart failure    CAD:s/p complex atherectomy and PCIoncomplicated by dissection/contained perforation treated with overlapping DES and a Papyrus covered stenton 12/24/19. No recurrent angina. Continue ASA and plavix(long term). This may be an issue if he has had a stroke and needs anticoagulation given  his age PAF and anemia  Chronic afib: since TAVR, he has been in a junctional rhythm with underlying atrial fibrillation. He has a baseline bifascicular block.He is hemodynamically stable. Avoid all AV nodal blocking agents; home Toprol XL discontinued. Hemodynamically stable   DMT2: treated with SSI while admitted. Resume home regimen at discharge  HTN: BP well controlled.   Carotid artery disease: 80-99% RICA stenosis. Followed by Dr. Myra Gianotti ( possible TCAR candidate).  ? Stroke:  MRI suggested RICA occlusion but he also had embolic events to both hemispheres.  He is to have CTA to document state of ICA known to be high grade prior to procedure Discussed with Dr Amada Jupiter this am He indicated Dr Royden Purl to see. If CTA shows occluded ICA continue ASA/Plavix. If it is patent may need subacute intervention but overall neurologic prognosis seems poor Dr Lyman Speller did not think he should be started on anticoagulation/eliquis  Signed, Charlton Haws, MD

## 2020-01-23 NOTE — Progress Notes (Signed)
Carotid duplex       has been completed. Preliminary results can be found under CV proc through chart review. Kamryn Gauthier, BS, RDMS, RVT   

## 2020-01-23 NOTE — Progress Notes (Addendum)
[address]       [place] [address]             [phone]     CARDIOTHORACIC SURGERY PROGRESS NOTE  5 Days Post-Op  S/P Procedure(s) (LRB): LOWER EXTREMITY ANGIOGRAM with intravascular shockwave lithotripsy (Bilateral) Intraoperative Transthoracic Echocardiogram (N/A) Intraoperative Transesophageal Echocardiogram Transcatheter Aortic Valve Replacement (subclavian approach) (Left) Aortogram  Subjective: More alert and cooperative today.  Moving left arm a little more.  Objective: Vital signs in last 24 hours: Temp:  [97.6 F (36.4 C)-97.9 F (36.6 C)] 97.7 F (36.5 C) (05/16 1110) Pulse Rate:  [37-54] 37 (05/16 1110) Cardiac Rhythm: Bundle branch block;Junctional rhythm (05/16 0735) Resp:  [15-20] 20 (05/16 1110) BP: (124-155)/(48-88) 147/48 (05/16 1110) SpO2:  [90 %-99 %] 99 % (05/16 1110) Weight:  [58.6 kg] 58.6 kg (05/16 0556)  Physical Exam:  Rhythm:   junctional  Breath sounds: Diminished at bases  Heart sounds:  RRR w/ systolic murmur  Incisions:  Clean and dry  Abdomen:  Soft, non-distended, non-tender  Extremities:  Warm, well-perfused    Intake/Output from previous day: 05/15 0701 - 05/16 0700 In: 560 [P.O.:560] Out: 1700 [Urine:1700] Intake/Output this shift: No intake/output data recorded.  Lab Results: Recent Labs    01/22/20 0325 01/23/20 0536  WBC 12.8* 10.9*  HGB 10.0* 10.7*  HCT 31.3* 32.4*  PLT 185 224   BMET:  Recent Labs    01/22/20 0325 01/23/20 0536  NA 136 137  K 3.5 3.2*  CL 98 93*  CO2 26 31  GLUCOSE 217* 193*  BUN 23 23  CREATININE 1.26* 1.16  CALCIUM 8.4* 8.8*    CBG (last 3)  Recent Labs    01/22/20 2117 01/23/20 0644 01/23/20 1121  GLUCAP 146* 195* 120*   PT/INR:  No results for input(s): LABPROT, INR in the last 72 hours.  CXR:  N/A   MRI HEAD WITHOUT AND WITH CONTRAST  TECHNIQUE: Multiplanar, multiecho pulse sequences of the brain and surrounding structures were obtained  without and with intravenous contrast.  CONTRAST:  66mL GADAVIST GADOBUTROL 1 MMOL/ML IV SOLN  COMPARISON:  Head CT 01/20/2020  FINDINGS: Brain: There are numerous small acute infarcts involving cortex and white matter of both cerebral hemispheres, overall right greater than left and with greatest involvement of the right frontal and parietal lobes towards the vertex including in the centrum semiovale. Small acute infarcts are also present in the basal ganglia bilaterally and in the left cerebellar hemisphere. A chronic microhemorrhage is noted in the right temporal lobe. There is a chronic lacunar infarct in the right lentiform nucleus. Mild cerebral atrophy is within normal limits for age. Postcontrast sequences are severely motion degraded. No mass, midline shift, or extra-axial fluid collection is identified.  Vascular: Abnormal appearance of the right internal carotid artery.  Skull and upper cervical spine: Unremarkable bone marrow signal.  Sinuses/Orbits: Right cataract extraction. Mild mucosal thickening in the paranasal sinuses. Trace right mastoid fluid.  Other: None.  IMPRESSION: 1. Numerous small acute infarcts in the right greater than left cerebral hemispheres and left cerebellum compatible with a central embolic source. 2. 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   Electronically Signed   By: Sebastian Ache M.D.   On: 01/22/2020 19:20    CT ANGIOGRAPHY HEAD AND NECK  TECHNIQUE: Multidetector CT imaging of the head and neck was performed using the standard protocol during bolus administration of intravenous contrast. Multiplanar CT image reconstructions and  MIPs were obtained to evaluate the vascular anatomy. Carotid stenosis measurements (when applicable) are obtained utilizing NASCET criteria, using the distal internal carotid diameter as the denominator.  CONTRAST:  49mL OMNIPAQUE IOHEXOL 350 MG/ML SOLN  COMPARISON:   MRI yesterday.  FINDINGS: CT HEAD FINDINGS  Brain: Brain shows age related atrophy. Chronic small-vessel ischemic changes seen throughout the hemispheric white matter. Some of the low densities particularly on the right probably correlate with the areas of acute infarction. No large confluent infarction. No mass effect, hemorrhage, hydrocephalus or extra-axial collection.  Vascular: There is atherosclerotic calcification of the major vessels at the base of the brain.  Skull: Negative  Sinuses: Previous functional endoscopic sinus surgery.  Orbits: Negative  Review of the MIP images confirms the above findings  CTA NECK FINDINGS  Aortic arch: Aortic atherosclerosis. Previous TAVR. Coronary artery atherosclerosis and stents. Branching pattern of the aorta is normal without origin stenosis.  Right carotid system: Common carotid artery shows some atherosclerotic plaque but is widely patent to the bifurcation region. Dense calcified plaque at the carotid bifurcation and ICA bulb. 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 No reconstitution.  Left carotid system: Common carotid artery shows some scattered plaque but is widely patent to the bifurcation region. Calcified plaque at the carotid bifurcation, ICA origin and ICA bulb. Severe stenosis of the proximal ICA, difficulty to accurately measure because of the dense calcification. Patent lumen is probably 1 mm or smaller. Beyond that, cervical ICA is patent to the skull base.  Vertebral arteries: Large bilateral vertebral arteries. Dense calcification at both vertebral artery origins. 70% stenosis on the right. 50% stenosis on the left.  Skeleton: Exaggerated cervical lordosis and thoracic kyphosis. Degenerative cervical spondylosis. Partial compression fracture of T3, likely old.  Other neck: No mass or lymphadenopathy.  Upper chest: Emphysema and pulmonary scarring.  Review of the MIP images confirms the above  findings  CTA HEAD FINDINGS  Anterior circulation: Left internal carotid artery is patent through the skull base and siphon regions. Ordinary siphon atherosclerotic calcification without stenosis greater than 30%. Supraclinoid ICA widely patent. Good supply to the left anterior and middle cerebral arteries. Patent anterior communicating artery allowing supply to the right anterior circulation. No right internal carotid flow at the skull base. Probable external to internal collaterals as well. Patent right posterior communicating artery as well. No large or medium vessel occlusion.  Posterior circulation: Both vertebral arteries widely patent through the foramen magnum. Atherosclerotic calcification of the left V4 segment with stenosis estimated at 30%. No basilar stenosis. Dense calcification at the basilar tip. This was not present on a head CT 12/28/2019, therefore this is an embolic calcification. There is result in stenosis of both P1 origins, but those vessels do continue to show flow.  Venous sinuses: Patent normal.  Anatomic variants: None significant.  Review of the MIP images confirms the above findings  IMPRESSION: Right ICA occlusion at the origin, precise age indeterminate. This may well be chronic.  Aortic atherosclerosis.  Advanced atherosclerotic disease at the left carotid bifurcation and proximal ICA. Severe stenosis of the proximal ICA with luminal diameter estimated at 1 mm. This would be an 80% or greater stenosis.  Stenotic disease at both vertebral artery origins. 70% stenosis on the right. 50% stenosis on the left.  Dense calcification at the basilar tip consistent with embolized plaque. This was not present on a CT scan 12/28/2019.  No acute intracranial large vessel occlusion. The embolized plaque at the basilar tip does result in  stenosis of both P1 origins, but those vessels continue to show flow.   Electronically Signed    By: Nelson Chimes M.D.   On: 01/23/2020 11:24    Assessment/Plan: S/P Procedure(s) (LRB): LOWER EXTREMITY ANGIOGRAM with intravascular shockwave lithotripsy (Bilateral) Intraoperative Transthoracic Echocardiogram (N/A) Intraoperative Transesophageal Echocardiogram Transcatheter Aortic Valve Replacement (subclavian approach) (Left) Aortogram  Results of MRI and CTA noted Agree w/ plans outlined by Stroke Team Discussed w/ wife at bedside Will likely need SNF placement  Rexene Alberts, MD 01/23/2020 12:12 PM

## 2020-01-24 ENCOUNTER — Encounter (HOSPITAL_COMMUNITY)
Admission: RE | Disposition: A | Discharge status: Skilled Nursing Facility | Payer: Self-pay | Source: Home / Self Care | Attending: Cardiovascular Disease

## 2020-01-24 DIAGNOSIS — I442 Atrioventricular block, complete: Secondary | ICD-10-CM

## 2020-01-24 DIAGNOSIS — R001 Bradycardia, unspecified: Secondary | ICD-10-CM

## 2020-01-24 HISTORY — PX: PACEMAKER IMPLANT: EP1218

## 2020-01-24 LAB — BASIC METABOLIC PANEL
Anion gap: 15 (ref 5–15)
BUN: 36 mg/dL — ABNORMAL HIGH (ref 8–23)
CO2: 25 mmol/L (ref 22–32)
Calcium: 8.3 mg/dL — ABNORMAL LOW (ref 8.9–10.3)
Chloride: 96 mmol/L — ABNORMAL LOW (ref 98–111)
Creatinine, Ser: 1.46 mg/dL — ABNORMAL HIGH (ref 0.61–1.24)
GFR calc Af Amer: 52 mL/min — ABNORMAL LOW (ref >60)
GFR calc non Af Amer: 45 mL/min — ABNORMAL LOW (ref >60)
Glucose, Bld: 192 mg/dL — ABNORMAL HIGH (ref 70–99)
Potassium: 4.4 mmol/L (ref 3.5–5.1)
Sodium: 136 mmol/L (ref 135–145)

## 2020-01-24 LAB — GLUCOSE, CAPILLARY
Glucose-Capillary: 160 mg/dL — ABNORMAL HIGH (ref 70–99)
Glucose-Capillary: 200 mg/dL — ABNORMAL HIGH (ref 70–99)
Glucose-Capillary: 201 mg/dL — ABNORMAL HIGH (ref 70–99)
Glucose-Capillary: 117 mg/dL — ABNORMAL HIGH (ref 70–99)

## 2020-01-24 SURGERY — PACEMAKER IMPLANT

## 2020-01-24 MED ORDER — ACETAMINOPHEN 325 MG PO TABS: 325.0000 mg | ORAL_TABLET | ORAL | Status: DC | PRN

## 2020-01-24 MED ORDER — SODIUM CHLORIDE 0.9 % IV SOLN: INTRAVENOUS | Status: DC

## 2020-01-24 MED ORDER — CEFAZOLIN SODIUM-DEXTROSE 2-4 GM/100ML-% IV SOLN
2.0000 g | INTRAVENOUS | Status: AC
  Administered 2020-01-24: 2 g via INTRAVENOUS

## 2020-01-24 MED ORDER — SODIUM CHLORIDE 0.9 % IV SOLN
80.0000 mg | INTRAVENOUS | Status: AC
  Administered 2020-01-24: 80 mg
  Filled 2020-01-24: qty 2

## 2020-01-24 MED ORDER — POTASSIUM CHLORIDE 10 MEQ/100ML IV SOLN
10.0000 meq | Freq: Once | INTRAVENOUS | Status: AC
  Administered 2020-01-24: 10 meq via INTRAVENOUS
  Filled 2020-01-24: qty 100

## 2020-01-24 MED ORDER — CHLORHEXIDINE GLUCONATE 4 % EX LIQD: 60.0000 mL | Freq: Once | CUTANEOUS | Status: DC

## 2020-01-24 MED ORDER — LIDOCAINE HCL 1 % IJ SOLN
INTRAMUSCULAR | Status: AC
  Filled 2020-01-24: qty 20

## 2020-01-24 MED ORDER — SODIUM CHLORIDE 0.9 % IV SOLN
INTRAVENOUS | Status: AC
  Filled 2020-01-24: qty 2

## 2020-01-24 MED ORDER — HEPARIN (PORCINE) IN NACL 1000-0.9 UT/500ML-% IV SOLN
INTRAVENOUS | Status: AC
  Filled 2020-01-24: qty 500

## 2020-01-24 MED ORDER — ONDANSETRON HCL 4 MG/2ML IJ SOLN: 4.0000 mg | Freq: Four times a day (QID) | INTRAMUSCULAR | Status: DC | PRN

## 2020-01-24 MED ORDER — CEFAZOLIN SODIUM-DEXTROSE 1-4 GM/50ML-% IV SOLN
1.0000 g | Freq: Two times a day (BID) | INTRAVENOUS | Status: AC
  Administered 2020-01-24 – 2020-01-25 (×2): 1 g via INTRAVENOUS
  Filled 2020-01-24 (×2): qty 50

## 2020-01-24 MED ORDER — POTASSIUM CHLORIDE 10 MEQ/100ML IV SOLN: 10.0000 meq | INTRAVENOUS | Status: DC

## 2020-01-24 MED ORDER — HEPARIN (PORCINE) IN NACL 1000-0.9 UT/500ML-% IV SOLN
INTRAVENOUS | Status: DC | PRN
  Administered 2020-01-24: 500 mL

## 2020-01-24 MED ORDER — SODIUM CHLORIDE 0.9 % IV SOLN
INTRAVENOUS | Status: DC | PRN
  Administered 2020-01-24: 500 mL

## 2020-01-24 MED ORDER — LIDOCAINE HCL (PF) 1 % IJ SOLN
INTRAMUSCULAR | Status: DC | PRN
  Administered 2020-01-24: 60 mL

## 2020-01-24 MED ORDER — CEFAZOLIN SODIUM-DEXTROSE 2-4 GM/100ML-% IV SOLN
INTRAVENOUS | Status: AC
  Filled 2020-01-24: qty 100

## 2020-01-24 SURGICAL SUPPLY — 9 items
CABLE SURGICAL S-101-97-12 (CABLE) ×3 IMPLANT
ELECT DEFIB PAD ADLT CADENCE (PAD) ×2 IMPLANT
HEMOSTAT SURGICEL 2X4 FIBR (HEMOSTASIS) ×2 IMPLANT
KIT MICROPUNCTURE NIT STIFF (SHEATH) ×2 IMPLANT
LEAD TENDRIL MRI 52CM LPA1200M (Lead) ×2 IMPLANT
PACEMAKER ASSURITY SR-SF (Pacemaker) ×2 IMPLANT
PAD PRO RADIOLUCENT 2001M-C (PAD) ×3 IMPLANT
SHEATH 8FR PRELUDE SNAP 13 (SHEATH) ×2 IMPLANT
TRAY PACEMAKER INSERTION (PACKS) ×3 IMPLANT

## 2020-01-24 NOTE — TOC Initial Note (Signed)
Transition of Care [facility]) - Initial/Assessment Note    Patient Details  Name: Garrett Hughes MRN: [id number] Date of Birth: [birth year]  [facility] CM/SW Contact:    Eduard Roux, LCSWA Phone Number: 01/24/2020, 3:58 PM  Clinical Narrative:                  CSW spoke to patient's spouse,[place] via phone. CSW introduced self and explained role. CSW discussed PT recommendation of short term rehab at [facility]. Patient's wife acknowledges the need for  PT and is agreeable to short term rehab at [facility]. CSW explained the SNF process. CSW was given permission to send SNF referrals. Patient's wife states no questions or concerns at this time.   CSW will provide bed offers once available. CSW will continue to follow  Antony Blackbird, MSW, LCSWA Clinical Social Worker   Expected Discharge Plan: Skilled Nursing Facility Barriers to Discharge: English as a second language teacher, Continued Medical Work up, SNF Pending bed offer   Patient Goals and CMS Choice        Expected Discharge Plan and Services Expected Discharge Plan: Skilled Nursing Facility In-house Referral: Clinical Social Work                                            Prior Living Arrangements/Services   Lives with:: Self, Siblings, Spouse Patient language and need for interpreter reviewed:: No        Need for Family Participation in Patient Care: Yes (Comment) Care giver support system in place?: Yes (comment)   Criminal Activity/Legal Involvement Pertinent to Current Situation/Hospitalization: No - Comment as needed  Activities of Daily Living Home Assistive Devices/Equipment: CBG Meter, Eyeglasses, Dentures (specify type), Built-in shower seat, Oxygen ADL Screening (condition at time of admission) Patient's cognitive ability adequate to safely complete daily activities?: Yes Is the patient deaf or have difficulty hearing?: No Does the patient have difficulty seeing, even when wearing  glasses/contacts?: No Does the patient have difficulty concentrating, remembering, or making decisions?: No Patient able to express need for assistance with ADLs?: Yes Does the patient have difficulty dressing or bathing?: No Independently performs ADLs?: Yes (appropriate for developmental age) Does the patient have difficulty walking or climbing stairs?: Yes Weakness of Legs: None Weakness of Arms/Hands: None  Permission Sought/Granted Permission sought to share information with : Family Supports, Magazine features editor, Case Estate manager/land agent granted to share information with : Yes, Verbal Permission Granted  Share Information with NAME: [place]  Permission granted to share info w AGENCY: SNFs  Permission granted to share info w Relationship: spouse  Permission granted to share info w Contact Information: [id number]  Emotional Assessment       Orientation: : Oriented to Self Alcohol / Substance Use: Not Applicable Psych Involvement: No (comment)  Admission diagnosis:  S/P TAVR (transcatheter aortic valve replacement) [Z95.2] Patient Active Problem List   Diagnosis Date Noted  . Cerebral embolism with cerebral infarction 01/23/2020  . S/P TAVR (transcatheter aortic valve replacement) 01/18/2020  . Carotid artery disease (HCC)   . COPD (chronic obstructive pulmonary disease) (HCC)   . Acute on chronic diastolic CHF (congestive heart failure) (HCC) 12/28/2019  . Severe aortic stenosis 12/28/2019  . CAD (coronary artery disease) 12/25/2019  . Hypocalcemia 03/26/2019  . GERD (gastroesophageal reflux disease)   . Chronic atrial fibrillation (HCC)   . 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  Type 2 diabetes mellitus with kidney complication, without long-term current use of insulin ([place]) 07/03/2016  . Low back pain 05/02/2016  . Benign essential hypertension 01/05/2016  . History of Barrett's esophagus 12/20/2015  . Hyperlipemia 10/26/2015  . Cataract, nuclear, right  05/06/2014   PCP:  Emeterio Reeve, DO Pharmacy:   [id number] [place], [place] [address] Phone: [phone] Fax: [phone]  [place], [place] [facility] [address] [place] [address] [place] [address] Phone: [phone] Fax: [phone]     Social Determinants of Health (SDOH) Interventions    Readmission Risk Interventions No flowsheet data found.

## 2020-01-24 NOTE — Progress Notes (Signed)
STROKE TEAM PROGRESS NOTE   INTERVAL HISTORY RN is at the bedside. Pt is lying in bed. Noted to be in bradycardia at 30s, regular rhythm though. Pt lethargic but arousable and responding to questions appropriately. Not orientated to place or time, can not tell me his age but able to tell me his name. Still has left UE weakness and left leg weaker than yesterday, felt largely due to lethargy. Do not think CT head repeat needed at this time and he is not stable to go out of floor with HR 30s. EP consulted for pacemaker.    OBJECTIVE Vitals:   01/23/20 2359 01/24/20 0227 01/24/20 0412 01/24/20 0819  BP: 120/60 (!) 135/55 (!) 142/43 (!) 150/47  Pulse: (!) 41 (!) 34 (!) 29 (!) 34  Resp: 15 18 14 18   Temp: 98.2 F (36.8 C) 97.7 F (36.5 C) 97.6 F (36.4 C) 97.6 F (36.4 C)  TempSrc: Oral Axillary Oral Axillary  SpO2: 100% 100% 100% 96%  Weight:   59.7 kg   Height:        CBC:  Recent Labs  Lab 01/22/20 0325 01/23/20 0536  WBC 12.8* 10.9*  HGB 10.0* 10.7*  HCT 31.3* 32.4*  MCV 95.7 94.5  PLT 185 224    Basic Metabolic Panel:  Recent Labs  Lab 01/19/20 0453 01/19/20 0453 01/20/20 0407 01/21/20 0407 01/22/20 0325 01/23/20 0536  NA 134*   < > 135   < > 136 137  K 3.9   < > 4.3   < > 3.5 3.2*  CL 101   < > 103   < > 98 93*  CO2 20*   < > 19*   < > 26 31  GLUCOSE 226*   < > 153*   < > 217* 193*  BUN 17   < > 21   < > 23 23  CREATININE 1.13   < > 1.22   < > 1.26* 1.16  CALCIUM 8.2*   < > 8.3*   < > 8.4* 8.8*  MG 1.1*  --  1.9  --   --   --    < > = values in this interval not displayed.    Lipid Panel:     Component Value Date/Time   CHOL 141 01/23/2020 0536   TRIG 83 01/23/2020 0536   HDL 51 01/23/2020 0536   CHOLHDL 2.8 01/23/2020 0536   VLDL 17 01/23/2020 0536   LDLCALC 73 01/23/2020 0536   LDLCALC 67 09/21/2019 0922   HgbA1c:  Lab Results  Component Value Date   HGBA1C 6.6 (H) 01/23/2020   Urine Drug Screen: No results found for: LABOPIA, COCAINSCRNUR,  LABBENZ, AMPHETMU, THCU, LABBARB  Alcohol Level No results found for: [facility]  IMAGING  MR BRAIN W WO CONTRAST 01/22/2020 IMPRESSION:  1. Numerous small acute infarcts in the right greater than left cerebral hemispheres and left cerebellum compatible with a central embolic source.  2. 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   CTA Head and Neck  01/23/2020 Right ICA occlusion at the origin, precise age indeterminate. This may well be chronic. Aortic atherosclerosis. Advanced atherosclerotic disease at the left carotid bifurcation and proximal ICA. Severe stenosis of the proximal ICA with luminal diameter estimated at 1 mm. This would be an 80% or greater stenosis. Stenotic disease at both vertebral artery origins. 70% stenosis on the right. 50% stenosis on the left. Dense calcification at the basilar tip consistent with embolized plaque. This was not  present on a CT scan 12/28/2019. No acute intracranial large vessel occlusion. The embolized plaque at the basilar tip does result in stenosis of both P1 origins, but those vessels continue to show flow.  Transthoracic Echocardiogram (Limited) 5/12//2021 IMPRESSIONS  1. Left ventricular ejection fraction, by estimation, is 70 to 75%. The  left ventricle has hyperdynamic function. The left ventricle has no  regional wall motion abnormalities. There is mild concentric left  ventricular hypertrophy. Left ventricular  diastolic function could not be evaluated. There is the interventricular  septum is flattened in systole, consistent with right ventricular pressure  overload.  2. Right ventricular systolic function is mildly reduced. The right  ventricular size is moderately enlarged. There is severely elevated  pulmonary artery systolic pressure. The estimated right ventricular  systolic pressure is 85.2 mmHg.  3. The mitral valve is normal in structure. Mild to moderate mitral valve  regurgitation.  4. Tricuspid  valve regurgitation is moderate to severe.  5. The aortic valve has been repaired/replaced. Aortic valve  regurgitation is not visualized. There is a 26 mm Edwards Sapien  prosthetic (TAVR) valve present in the aortic position. Procedure Date:  01/18/2020. Echo findings are consistent with normal  structure and function of the aortic valve prosthesis. Aortic valve mean  gradient measures 9.3 mmHg. Aortic valve Vmax measures 2.24 m/s.  6. The inferior vena cava is dilated in size with <50% respiratory  variability, suggesting right atrial pressure of 15 mmHg.  Comparison(s): There is marked worsening of pulmonary artery hypertension  and severity of tricuspid regurgitation since the intra-procedural study.   ECG - Junctional rhythm with background of atrial fibrillation - ventricular response 50 BPM (See cardiology reading for complete details)  PHYSICAL EXAM  Temp:  [97.6 F (36.4 C)-98.5 F (36.9 C)] 97.6 F (36.4 C) (05/17 0819) Pulse Rate:  [29-51] 34 (05/17 0819) Resp:  [14-20] 18 (05/17 0819) BP: (120-159)/(43-60) 150/47 (05/17 0819) SpO2:  [96 %-100 %] 96 % (05/17 0819) Weight:  [59.7 kg] 59.7 kg (05/17 0412)  General - Well nourished, well developed, lethargic and sleepy drowsy.  Ophthalmologic - fundi not visualized due to noncooperation.  Cardiovascular - Regular rhythm, but significant bradycardia with HR low 30s.  Neuro - sleepy drowsy and eyes closed, able to open with voice stimulation, oriented to his name only, not to place, age, or time. Following simple commands, paucity of speech but no aphasia. Not cooperative with naming or repeating.  Visual field testing not cooperative, not blinking to visual threat bilaterally in a consistent pattern.  No gaze palsy, able to have bilateral gaze, PERRL, tracking bilaterally.  Slight left nasolabial fold flattening.  Tongue midline.  Right upper extremity at least 4/5, left upper extremity 2/5 proximal and distal.  Right lower  extremity 3/5 proximal and distal, left LE 2/5 proximal and distal.  DTR 1+, no Babinski.  Sensation and coordination not cooperative, gait not tested.   ASSESSMENT/PLAN [name] is a 79 y.o. male with history of DM 2, CABG (2017), HLD, chronic a fib,COPD who presented to [facility] for TAVR (5/11). Neurology was consulted 5/15 for continued post op AMS and left arm weakness.  He did not receive IV t-PA due to recent surgery and late presentation (>4.5 hours from time of onset).  Stroke: Numerous small acute infarcts in the right greater than left cerebral hemispheres and left cerebellum compatible with a central embolic source. Etiology could be multiple, large vessel occlusion/stenosis in the setting of procedure/low BP vs. TAVR  procedure related vs. Chronic afib not on AC  CT head - no acute abnormality  MRI head - Numerous small acute infarcts in the right greater than left cerebral hemispheres and left cerebellum compatible with a central embolic source.   CTA H&N - right ICA occlusion at origin, severe stenosis of proximal left ICA, >80% stenosis.  Bilateral VA origin stenosis, 70% on the right, 50% on the left.  Dense calcification plaque at the basilar tip, new from previous CT, with bilateral stenosis of P1 origins.  TEE 5/11 EF 50 to 55%, no PFO, no LA or LAA thrombus  2D Echo 5/12 - EF 70 to 75%. No cardiac source of emboli identified.   Sars Corona Virus 2 - negative  LDL - 73  HgbA1c - 6.6  VTE prophylaxis - Lovenox  aspirin 81 mg daily and clopidogrel 75 mg daily prior to admission, now on aspirin 325 mg daily and clopidogrel 75 mg daily.  Patient counseled to be compliant with his antithrombotic medications  Ongoing aggressive stroke risk factor management  Therapy recommendations:  SNF   Disposition:  Pending  Carotid occlusion/stenosis  12/08/2019 followed with vascular surgery, carotid Doppler at that time right ICA 80 to 90%, left ICA 40- 59%  stenosis.  This admission CTA head and neck showed right ICA occlusion and left ICA more than 80% stenosis  Repeat carotid Doppler Right ICA occluded and left ICA 80-99% stenosis - progressed from 11/2019  On aspirin 325 and Plavix DAPT  Avoid low BP  Long-term BP goal 130-160  Basilar tip calcified plaque  Seen on current CT and CTA head and neck, but not on CT in 12/2019  Concerning for embolic plaque, likely related to recent cardiac procedure  Resulted bilateral P1 stenosis but no flow-limiting  Continue DAPT  Avoid low BP  Bradycardia   HR 30s, but regular rhythm  EP consulted  As per Dr. Roxy Manns, pt likely need permanent pacemaker  Chronic PAF  Not on [facility] given history of GI bleeding and urinary tract bleeding  Currently on DAPT  Further regimen per cardiology  Hypertension  Home BP meds: Lisinopril ; metoprolol; cardura  Current BP meds: Lisinopril 5 and Lasix 60 twice daily  Stable . Long-term BP goal 130-160  Hyperlipidemia  Home Lipid lowering medication: Pravastatin 40 mg daily  LDL 73, goal < 70  Current lipid lowering medication: Pravastatin 40 mg daily  Continue statin at discharge  Intolerant to high dose statins  Diabetes  Home diabetic meds: Glucotrol ; metformin  Current diabetic meds: Lantus  HgbA1c 6.6, goal < 7.0  SSI  CBG monitoring  PCP follow-up  Other Stroke Risk Factors  Advanced age  Former cigarette smoker - quit  Coronary artery disease status post CABG in 2017  Severe AS s/p TAVR   Other Active Problems  Code status - Full code  Bradycardia - junctional rhythm 30's to 40's  Leukocytosis - 12.8->10.7  Hypokalemia - 3.5->3.2 supplement  AKI - creatinine - 1.26->1.16  Hospital day # 6  Patient continues to be critically ill for the last 24 hours, has developed significant bradycardia, lethargy with worsening left sided weakness likely due to lethargy and cardiac condition. He likely needs pacemaker  and EP has consulted. I spent  35 minutes in total face-to-face time with the patient, more than 50% of which was spent in counseling and coordination of care, reviewing test results, images and medication, and discussing the diagnosis of embolic shower, carotid stenosis occlusion, basilar A  and PCA stenosis, afib and BP management, treatment plan and potential prognosis. This patient's care requiresreview of multiple databases, neurological assessment, discussion with wife, other specialists and medical decision making of high complexity.  Marvel Plan, MD PhD Stroke Neurology 01/24/2020 10:27 AM   To contact Stroke Continuity provider, please refer to [email or web address]. After hours, contact General Neurology

## 2020-01-24 NOTE — TOC CAGE-AID Note (Signed)
Transition of Care [facility]) - CAGE-AID Screening   Patient Details  Name: Garrett Hughes MRN: [id number] Date of Birth: [birth year]  [facility]) CM/SW Contact:    Jimmy Picket, LCSWA Phone Number: 01/24/2020, 3:43 PM   Clinical Narrative:  Pt was unable to participate in assessment due to pt not being fully oriented.   CAGE-AID Screening: Substance Abuse Screening unable to be completed due to: : Patient unable to participate            Isabella Stalling Clinical Social Worker [phone]

## 2020-01-24 NOTE — Consult Note (Addendum)
Cardiology Consultation:   Patient ID: Garrett Hughes MRN: [id number]; DOB: 07-09-1941  Admit date: 01/18/2020 Date of Consult: 01/24/2020  Primary Care Provider: Emeterio Reeve, DO Primary Cardiologist: Kirk Ruths, MD  Structural Heart: Dr. Burt Knack Primary Electrophysiologist:  None    Patient Profile:   Garrett Hughes is a 79 y.o. male with a hx of CABG 1997  >> 12/24/2019 s/p complex atherectomy and PCI last week complicated by dissection/contained perforation treated with overlapping DES and a Papyrus covered stent, chronic atrial fibrillation (notes report pt declines anticoagulation), severe aortic stenosis,RBBB, carotid artery disease,HTN, DM, HLD, chronic appearing anemia/thrombocytopenia, GERD, Barrett's esophagus, h/o GIB, PVD (80-99% RICA stenosis. Followed by Dr. Trula Slade ( possible TCAR candidate), and severe AS   who is being seen today for the evaluation of bradycardia at the request of Dr. Roxy Manns.  History of Present Illness:   [name] Recent echocardiogram performed October 19, 2019 revealed normal left ventricular systolic function with severe aortic stenosis, moderate aortic insufficiency and moderate mitral regurgitation.  He was seen in follow-up by Dr. Stanford Breed and referred to the multidisciplinary heart valve clinic.  Diagnostic cardiac catheterization performed by Dr. Burt Knack on November 26, 2019 revealed severe multivessel coronary artery disease with chronic occlusion of the left coronary artery and severe high-grade stenosis of the right coronary artery.  There was continued patency of left internal mammary artery to the left anterior descending coronary artery and greater saphenous vein graft to the obtuse marginal branch.  Right internal mammary artery graft previously placed to the right coronary artery was atretic.  The patient's case was reviewed by multidisciplinary team of specialists and the patient subsequently was brought back to the Cath Lab on December 24, 2019  for PCI and stenting of the right coronary artery.  This procedure was complicated by coronary perforation requiring placement of a covered stent.  Following the procedure the patient had 2 days of chest pain but serial echocardiograms revealed no significant pericardial effusion.  The patient was discharged from the hospital but readmitted December 28, 2019 following a syncopal episode that was felt likely to be related to his aortic stenosis, atrial fibrillation, and dehydration.  Medications were adjusted and a ZIO patch monitor placed  He was admitted 01/18/20 underwent TAVR  Transcatheter Aortic Valve Replacement - Attempted Percutaneous Transfemoral Approach converted to Subclavian Approach             Edwards Sapien 3 THV (size 26 mm, model # 9750TFX, serial # [id number])  Shockwave Balloon Angioplasty right common femoral artery  Operative course complicated by balloon rupture during valve deployment and axillary artery damage, this was successfully repaired and there was no significant vascular injury  HR post procedure remained AFib 50's not felt to be CHB, diuresed for volume OL 5/13 developed confusion, rates remained 50's described as junctional, no high grade block or prolonged pauses No nodal blockers on board Neuro brought on board found with Numerous small acute infarcts in the right greater than left cerebral hemispheres and left cerebellum compatible with a central embolic source. Etiology could be multiple, large vessel occlusion/stenosis in the setting of procedure/low BP vs. TAVR procedure related vs. Chronic afib not on AC  EP is brought on board with persistent and further slowing of his HR now to 30's persistently and regular suggesting CHB. On no potential nodal blocking agents since here  LABS  (yesterday) K+ 3.2 (replaced) BUN/Creat 23/1.16  WBC 10.9 H/H 10.7/32/4 Plts 224    Past Medical History:  Diagnosis Date   Anemia    Arthritis    Atherosclerosis of  native coronary artery of native heart without angina pectoris 01/05/2016   Benign essential hypertension 01/05/2016   Carotid artery disease ([place])    Cataract, nuclear, right 05/06/2014   Chronic atrial fibrillation ([place]) 12/20/2015   Chronic diarrhea    COPD (chronic obstructive pulmonary disease) ([place])    on nocturnal O2   Current use of long term anticoagulation 12/20/2015   Diabetic kidney disease ([place]) 01/05/2016   GERD (gastroesophageal reflux disease)    GI bleed    History of Barrett's esophagus 12/20/2015   History of blood in urine    History of kidney stones    Hx of CABG 07/03/2016   Overview:  1997, LIMA- LAD, RIMA to RCA, SVG to OM   Hyperlipemia 10/26/2015   Low back pain 05/02/2016   S/P TAVR (transcatheter aortic valve replacement) 01/18/2020   s/p successful TAVR w/ a 26 mm Edwards S3U via the left subclavian approach (failed Shockwave TF) with Dr. Burt Knack and Dr. Roxy Manns    Severe aortic stenosis    Thrombocytopenia ([place])    Type 2 diabetes mellitus with kidney complication, without long-term current use of insulin ([place]) 07/03/2016    Past Surgical History:  Procedure Laterality Date   AORTOGRAM  01/18/2020   Procedure: Aortogram;  Surgeon: Sherren Mocha, MD;  [facility]: [facility];  Service: Open Heart Surgery;;   BACK SURGERY  2004   [place]   CORONARY ATHERECTOMY N/A 12/24/2019   Procedure: CORONARY ATHERECTOMY;  Surgeon: Sherren Mocha, MD;  Location: [place] CV LAB;  Service: Cardiovascular;  Laterality: N/A;   INTRAOPERATIVE TRANSESOPHAGEAL ECHOCARDIOGRAM  01/18/2020   Procedure: Intraoperative Transesophageal Echocardiogram;  Surgeon: Sherren Mocha, MD;  Location: [place];  Service: Open Heart Surgery;;   INTRAOPERATIVE TRANSTHORACIC ECHOCARDIOGRAM N/A 01/18/2020   Procedure: Intraoperative Transthoracic Echocardiogram;  Surgeon: Sherren Mocha, MD;  Location: [place];  Service: Open Heart  Surgery;  Laterality: N/A;   LOWER EXTREMITY ANGIOGRAM Bilateral 01/18/2020   Procedure: LOWER EXTREMITY ANGIOGRAM with intravascular shockwave lithotripsy;  Surgeon: Sherren Mocha, MD;  Location: [place];  Service: Cardiovascular;  Laterality: Bilateral;   RIGHT HEART CATH AND CORONARY/GRAFT ANGIOGRAPHY N/A 11/26/2019   Procedure: RIGHT HEART CATH AND CORONARY/GRAFT ANGIOGRAPHY;  Surgeon: Sherren Mocha, MD;  Location: [place] CV LAB;  Service: Cardiovascular;  Laterality: N/A;   SHOULDER SURGERY  2005   TRANSCATHETER AORTIC VALVE REPLACEMENT, TRANSFEMORAL Left 01/18/2020   Procedure: Transcatheter Aortic Valve Replacement (subclavian approach);  Surgeon: Sherren Mocha, MD;  Location: [place];  Service: Open Heart Surgery;  Laterality: Left;     Home Medications:  Prior to Admission medications   Medication Sig Start Date End Date Taking? Authorizing Provider  aspirin 81 MG chewable tablet Chew 81 mg by mouth daily.    Yes [provider]  clopidogrel (PLAVIX) 75 MG tablet Take 75 mg by mouth daily.   Yes [provider]  ferrous sulfate 325 (65 FE) MG EC tablet Take 1 tablet (325 mg total) by mouth daily with breakfast. 09/14/18  Yes Emeterio Reeve, DO  furosemide (LASIX) 40 MG tablet Take 0.5 tablets (20 mg total) by mouth daily. 12/26/19  Yes Dunn, Dayna N, PA-C  glipiZIDE (GLUCOTROL) 5 MG tablet TAKE 1 TABLET BY MOUTH  TWICE DAILY BEFORE MEALS Patient taking differently: Take 5 mg by mouth 2 (two) times daily before a meal. TAKE 1  TABLET BY MOUTH  TWICE DAILY BEFORE MEALS 01/10/20  Yes Emeterio Reeve, DO  glucose blood (ACCU-CHEK AVIVA PLUS) test strip Use as instructed to check blood sugar up to qid. Dx Code E11.22. Disp generic per pt preference / insurance coverage 06/15/19  Yes Emeterio Reeve, DO  metFORMIN (GLUCOPHAGE) 1000 MG tablet TAKE 1 TABLET BY MOUTH  TWICE DAILY WITH A MEAL Patient taking differently: Take 1,000 mg by mouth 2 (two) times daily with  a meal. TAKE 1 TABLET BY MOUTH  TWICE DAILY WITH A MEAL 01/10/20  Yes Emeterio Reeve, DO  metoprolol succinate (TOPROL-XL) 25 MG 24 hr tablet Take 1 tablet (25 mg total) by mouth daily. 01/10/20  Yes Emeterio Reeve, DO  Multiple Vitamin (MULTIVITAMIN WITH MINERALS) TABS tablet Take 1 tablet by mouth daily.   Yes [provider]  nitroGLYCERIN (NITROSTAT) 0.4 MG SL tablet Place 1 tablet (0.4 mg total) under the tongue every 5 (five) minutes as needed for chest pain. 09/14/18  Yes Emeterio Reeve, DO  pantoprazole (PROTONIX) 20 MG tablet TAKE 1 TABLET BY MOUTH  DAILY Patient taking differently: Take 20 mg by mouth daily.  01/10/20  Yes Emeterio Reeve, DO  pravastatin (PRAVACHOL) 40 MG tablet Take 1 tablet (40 mg total) by mouth daily. 01/10/20  Yes Emeterio Reeve, DO  ACCU-CHEK SOFTCLIX LANCETS lancets Use as instructed to check blood sugar up to qid. Dx Code E11.22. 06/02/18   Hali Marry, MD  Alcohol Swabs PADS Use as instructed to check blood sugar up to qid. Dx code E11.22 06/02/18   Emeterio Reeve, DO  AMBULATORY NON FORMULARY MEDICATION Medication Name: accu-chek aviva plus meter, accu-check aviva plus test strips & accu-chek softclix lancets to test twice a day. Dx: E11.29 06/03/18   Hali Marry, MD  Blood Glucose Calibration (ACCU-CHEK AVIVA) SOLN Use as instructed per manufacturer directions. Dx code E11.22 06/02/18   Emeterio Reeve, DO  Blood Glucose Monitoring Suppl (ACCU-CHEK AVIVA PLUS) w/Device KIT Use as instructed to check blood sugar up to qid. Dx code E11.22 06/02/18   Hali Marry, MD  doxazosin (CARDURA) 8 MG tablet TAKE 1 TABLET BY MOUTH  DAILY Patient not taking: Reported on 01/10/2020 01/10/20   Emeterio Reeve, DO  Lancets [facility] ULTRASOFT) lancets Use as instructed to check blood sugar up to qid. Dx: E11.22 11/06/17   Emeterio Reeve, DO  lisinopril (ZESTRIL) 5 MG tablet TAKE 1 TABLET BY MOUTH  DAILY Patient not taking:  Reported on 01/10/2020 01/10/20   Emeterio Reeve, DO    Inpatient Medications: Scheduled Meds:  aspirin EC  325 mg Oral Daily   Chlorhexidine Gluconate Cloth  6 each Topical Daily   clopidogrel  75 mg Oral Daily   enoxaparin (LOVENOX) injection  40 mg Subcutaneous Q24H   ferrous sulfate  325 mg Oral Q breakfast   insulin aspart  0-24 Units Subcutaneous TID AC & HS   insulin aspart  5 Units Subcutaneous TID WC   insulin glargine  5 Units Subcutaneous Daily   lisinopril  5 mg Oral Daily   pantoprazole  20 mg Oral Daily   pravastatin  40 mg Oral Daily   sodium chloride flush  3 mL Intravenous Q12H   Continuous Infusions:  sodium chloride     sodium chloride Stopped (01/20/20 0135)   PRN Meds: sodium chloride, acetaminophen **OR** acetaminophen, ondansetron (ZOFRAN) IV, sodium chloride flush  Allergies:    Allergies  Allergen Reactions   Atorvastatin     Other reaction(s): Myalgias (  intolerance)   Cholestyramine Other (See Comments)    Constipation   Pravastatin Other (See Comments)    Does not tolerate high doses    Social History:   Social History   Socioeconomic History   Marital status: Married    Spouse name: Edd Fabian   Number of children: 2   Years of education: 11   Highest education level: 11th grade  Occupational History   Occupation: truck Geophysicist/field seismologist    Comment: retired  Tobacco Use   Smoking status: Former Smoker    Quit date: 09/10/1987    Years since quitting: 32.3   Smokeless tobacco: Never Used  Substance and Sexual Activity   Alcohol use: No   Drug use: No   Sexual activity: Not Currently  Other Topics Concern   Not on file  Social History Narrative   Married, retired Administrator   1 son one daughter   2 caffeinated beverages daily   3. Keeps grandsons and granddaughter   Social Determinants of Health   Financial Resource Strain:    Difficulty of Paying Living Expenses:   Food Insecurity: No Food Insecurity    Worried About Charity fundraiser in the Last Year: Never true   Ran Out of Food in the Last Year: Never true  Transportation Needs: No Transportation Needs   Lack of Transportation (Medical): No   Lack of Transportation (Non-Medical): No  Physical Activity:    Days of Exercise per Week:    Minutes of Exercise per Session:   Stress:    Feeling of Stress :   Social Connections:    Frequency of Communication with Friends and Family:    Frequency of Social Gatherings with Friends and Family:    Attends Religious Services:    Active Member of Clubs or Organizations:    Attends Music therapist:    Marital Status:   Intimate Partner Violence:    Fear of Current or Ex-Partner:    Emotionally Abused:    Physically Abused:    Sexually Abused:     Family History:   Family History  Problem Relation Age of Onset   CAD Father    Colon cancer Neg Hx    Heart attack Neg Hx      ROS:  Please see the history of present illness.  All other ROS reviewed and negative.     Physical Exam/Data:   Vitals:   01/23/20 2359 01/24/20 0227 01/24/20 0412 01/24/20 0819  BP: 120/60 (!) 135/55 (!) 142/43 (!) 150/47  Pulse: (!) 41 (!) 34 (!) 29 (!) 34  Resp: '15 18 14 18  ' Temp: 98.2 F (36.8 C) 97.7 F (36.5 C) 97.6 F (36.4 C) 97.6 F (36.4 C)  TempSrc: Oral Axillary Oral Axillary  SpO2: 100% 100% 100% 96%  Weight:   59.7 kg   Height:        Intake/Output Summary (Last 24 hours) at 01/24/2020 0942 Last data filed at 01/24/2020 0429 Gross per 24 hour  Intake 683 ml  Output 1100 ml  Net -417 ml   Last 3 Weights 01/24/2020 01/23/2020 01/22/2020  Weight (lbs) 131 lb 9.8 oz 129 lb 3 oz 134 lb 0.6 oz  Weight (kg) 59.7 kg 58.6 kg 60.8 kg     Body mass index is 22.59 kg/m.  General:  Well nourished, sleeping, wakes, opens eyes, nothing further HEENT: normal Lymph: no adenopathy Neck: no JVD Endocrine:  No thryomegaly Vascular: No carotid bruits  Cardiac:   RRR;  bradycardic, 1/6SM, no gallops or rubs Lungs:  CTA b/l, no wheezing, rhonchi or rales  Abd: soft, nontender Ext: no edema Musculoskeletal:  No deformities, Skin: warm and dry  Neuro:  He is somnolent but arouses with difficulty with loud verbal and physical stimulus Psych:  Normal affect   EKG:  The EKG was personally reviewed and demonstrates:   April AFib 71bpm, RBBBn , LAD 01/18/20 AFib 88, RBBB, LAD 5/12/221 AFib 50bpm, regular, RBBB, LAD  Today is AFib 31bpm, RBBB , normal axis, diffuse    Telemetry:  Telemetry was personally reviewed and demonstrates:   AFib, VR is 30 and regular  Relevant CV Studies:   01/18/2020 : [facility] TEE PRE-PROCEDURAL FINDINGS:   Normal left ventricular systolic function. Estimated LVEF 55-60%.  There are no regional wall motion abnormalities.  Severe calcific aortic stenosis. Trileaflet aortic valve.  Peak aortic valve gradient 60 mm Hg, mean gradient 31 mm Hg.  Dimensionless obstructive index 0.15, calculated aortic valve area is 0.8  cm.  Mild aortic insufficiency.  Moderate mitral insufficiency.  Small posterior pericardial effusion.   POST-PROCEDURAL FINDINGS:   Hyperdynamic left ventricular systolic function. Estimated LVEF 70%.  There are no regional wall motion abnormalities.  Well-seated TAVR stent-valve.  Peak aortic valve gradient 5 mm Hg, mean gradient 2 mm Hg.  Dimensionless obstructive index 0.43, calculated aortic valve area is 1.43  cm.  There is no aortic insufficiency and no perivalvular leak.  Mild mitral insufficiency, substantially improved.  Small posterior pericardial effusion.  IMPRESSIONS    1. Left ventricular ejection fraction, by estimation, is 50 to 55%. The  left ventricle has low normal function. The left ventricle has no regional  wall motion abnormalities. There is moderate concentric left ventricular  hypertrophy.  2. Right ventricular systolic function is normal. The right ventricular   size is normal.  3. Left atrial size was severely dilated. No left atrial/left atrial  appendage thrombus was detected.  4. Right atrial size was moderately dilated.  5. The mitral valve is grossly normal. Moderate mitral valve  regurgitation.  6. The aortic valve is tricuspid. Aortic valve regurgitation is mild.  Severe aortic valve stenosis. There is a 26 mm Edwards Medtronic, stented  (TAVR) valve present in the aortic position. Procedure Date: 01/18/2020.  7. There is mild (Grade II) atheroma plaque.   Laboratory Data:  High Sensitivity Troponin:   Recent Labs  Lab 12/28/19 0222 12/28/19 0355 12/28/19 2001 12/28/19 2153  TROPONINIHS 1,590* 1,542* 1,176* 1,013*     Chemistry Recent Labs  Lab 01/21/20 0407 01/22/20 0325 01/23/20 0536  NA 134* 136 137  K 3.6 3.5 3.2*  CL 98 98 93*  CO2 '23 26 31  ' GLUCOSE 212* 217* 193*  BUN '18 23 23  ' CREATININE 1.06 1.26* 1.16  CALCIUM 8.3* 8.4* 8.8*  GFRNONAA >60 54* 60*  GFRAA >60 >60 >60  ANIONGAP '13 12 13    ' No results for input(s): PROT, ALBUMIN, AST, ALT, ALKPHOS, BILITOT in the last 168 hours. Hematology Recent Labs  Lab 01/21/20 0407 01/22/20 0325 01/23/20 0536  WBC 13.5* 12.8* 10.9*  RBC 3.08* 3.27* 3.43*  HGB 9.6* 10.0* 10.7*  HCT 29.4* 31.3* 32.4*  MCV 95.5 95.7 94.5  MCH 31.2 30.6 31.2  MCHC 32.7 31.9 33.0  RDW 16.1* 15.6* 15.5  PLT 153 185 224   BNPNo results for input(s): BNP, PROBNP in the last 168 hours.  DDimer No results for input(s): DDIMER in the last 168 hours.   Radiology/Studies:  CT ANGIO HEAD W OR WO CONTRAST Result Date: 01/23/2020 CLINICAL DATA:  Follow-up stroke. Left hemiparesis and neglect. Multiple bilateral micro embolic infarctions. EXAM: CT ANGIOGRAPHY HEAD AND NECK TECHNIQUE: Multidetector CT imaging of the head and neck was performed using the standard protocol during bolus administration of intravenous contrast. Multiplanar CT image reconstructions and MIPs were obtained to  evaluate the vascular anatomy. Carotid stenosis measurements (when applicable) are obtained utilizing NASCET criteria, using the distal internal carotid diameter as the denominator. CONTRAST:  57m OMNIPAQUE IOHEXOL 350 MG/ML SOLN COMPARISON:  MRI yesterday. FINDINGS: CT HEAD FINDINGS Brain: Brain shows age related atrophy. Chronic small-vessel ischemic changes seen throughout the hemispheric white matter. Some of the low densities particularly on the right probably correlate with the areas of acute infarction. No large confluent infarction. No mass effect, hemorrhage, hydrocephalus or extra-axial collection. Vascular: There is atherosclerotic calcification of the major vessels at the base of the brain. Skull: Negative Sinuses: Previous functional endoscopic sinus surgery. Orbits: Negative Review of the MIP images confirms the above findings CTA NECK FINDINGS Aortic arch: Aortic atherosclerosis. Previous TAVR. Coronary artery atherosclerosis and stents. Branching pattern of the aorta is normal without origin stenosis. Right carotid system: Common carotid artery shows some atherosclerotic plaque but is widely patent to the bifurcation region. Dense calcified plaque at the carotid bifurcation and ICA bulb. 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 No reconstitution. Left carotid system: Common carotid artery shows some scattered plaque but is widely patent to the bifurcation region. Calcified plaque at the carotid bifurcation, ICA origin and ICA bulb. Severe stenosis of the proximal ICA, difficulty to accurately measure because of the dense calcification. Patent lumen is probably 1 mm or smaller. Beyond that, cervical ICA is patent to the skull base. Vertebral arteries: Large bilateral vertebral arteries. Dense calcification at both vertebral artery origins. 70% stenosis on the right. 50% stenosis on the left. Skeleton: Exaggerated cervical lordosis and thoracic kyphosis. Degenerative cervical spondylosis. Partial  compression fracture of T3, likely old. Other neck: No mass or lymphadenopathy. Upper chest: Emphysema and pulmonary scarring. Review of the MIP images confirms the above findings CTA HEAD FINDINGS Anterior circulation: Left internal carotid artery is patent through the skull base and siphon regions. Ordinary siphon atherosclerotic calcification without stenosis greater than 30%. Supraclinoid ICA widely patent. Good supply to the left anterior and middle cerebral arteries. Patent anterior communicating artery allowing supply to the right anterior circulation. No right internal carotid flow at the skull base. Probable external to internal collaterals as well. Patent right posterior communicating artery as well. No large or medium vessel occlusion. Posterior circulation: Both vertebral arteries widely patent through the foramen magnum. Atherosclerotic calcification of the left V4 segment with stenosis estimated at 30%. No basilar stenosis. Dense calcification at the basilar tip. This was not present on a head CT 12/28/2019, therefore this is an embolic calcification. There is result in stenosis of both P1 origins, but those vessels do continue to show flow. Venous sinuses: Patent normal. Anatomic variants: None significant. 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 This may well be chronic. Aortic atherosclerosis. Advanced atherosclerotic disease at the left carotid bifurcation and proximal ICA. Severe stenosis of the proximal ICA with luminal diameter estimated at 1 mm. This would be an 80% or greater stenosis. Stenotic disease at both vertebral artery origins. 70% stenosis on the right. 50% stenosis on the left. Dense calcification at the basilar tip consistent with embolized  plaque. This was not present on a CT scan 12/28/2019. No acute intracranial large vessel occlusion. The embolized plaque at the basilar tip does result in  stenosis of both P1 origins, but those vessels continue to show flow. Electronically Signed   By: Nelson Chimes M.D.   On: 01/23/2020 11:24    CT HEAD WO CONTRAST Result Date: 01/20/2020 CLINICAL DATA:  Altered mental status. EXAM: CT HEAD WITHOUT CONTRAST TECHNIQUE: Contiguous axial images were obtained from the base of the skull through the vertex without intravenous contrast. COMPARISON:  Head CT scan 12/28/2019 FINDINGS: Brain: No evidence of acute infarction, hemorrhage, hydrocephalus, extra-axial collection or mass lesion/mass effect. There is some cortical atrophy and chronic microvascular ischemic disease, unchanged. Vascular: Atherosclerosis. Skull: Intact.  No focal lesion. Sinuses/Orbits: Status post maxillary antrostomy and ethmoidectomy. Mild mucosal thickening right maxillary sinus noted. Other: None. IMPRESSION: No acute abnormality. Electronically Signed   By: Inge Rise M.D.   On: 01/20/2020 13:38    MR BRAIN W WO CONTRAST Result Date: 01/22/2020 CLINICAL DATA:  Altered mental status, mild left hemiparesis, and left hemineglect. EXAM: MRI HEAD WITHOUT AND WITH CONTRAST TECHNIQUE: Multiplanar, multiecho pulse sequences of the brain and surrounding structures were obtained without and with intravenous contrast. CONTRAST:  4m GADAVIST GADOBUTROL 1 MMOL/ML IV SOLN COMPARISON:  Head CT 01/20/2020 FINDINGS: Brain: There are numerous small acute infarcts involving cortex and white matter of both cerebral hemispheres, overall right greater than left and with greatest involvement of the right frontal and parietal lobes towards the vertex including in the centrum semiovale. Small acute infarcts are also present in the basal ganglia bilaterally and in the left cerebellar hemisphere. A chronic microhemorrhage is noted in the right temporal lobe. There is a chronic lacunar infarct in the right lentiform nucleus. Mild cerebral atrophy is within normal limits for age. Postcontrast sequences are  severely motion degraded. No mass, midline shift, or extra-axial fluid collection is identified. Vascular: Abnormal appearance of the right internal carotid artery. Skull and upper cervical spine: Unremarkable bone marrow signal. Sinuses/Orbits: Right cataract extraction. Mild mucosal thickening in the paranasal sinuses. Trace right mastoid fluid. Other: None. IMPRESSION: 1. Numerous small acute infarcts in the right greater than left cerebral hemispheres and left cerebellum compatible with a central embolic source. 2. 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 Electronically Signed   By: ALogan BoresM.D.   On: 01/22/2020 19:20     VAS [place]OTID Result Date: 01/23/2020 Carotid Arterial Duplex Study Indications:       Known carotid stenosis, S/P TAVR with new right sided                    weakness and AMS, suspect stroke. Other Factors:     01/23/20 CTA results right ICA occluded and left ICA severe                    stenosis. Comparison Study:  12/08/19 right 80-99% left 40-59% Performing Technologist: JJune LeapRDMS, RVT  Examination Guidelines: A complete evaluation includes B-mode imaging, spectral Doppler, color Doppler, and power Doppler as needed of all accessible portions of each vessel. Bilateral testing is considered an integral part of a complete examination. Limited examinations for reoccurring indications may be performed as noted.  Right Carotid Findings: Summary: Right Carotid: Evidence consistent with a total occlusion of the right ICA. Left Carotid: Velocities in the left ICA are consistent with a 80-99% stenosis.  *See table(s) above for measurements  and observations.  Electronically signed by Ruta Hinds MD on 01/23/2020 at 6:43:02 PM.    Final    {  Assessment and Plan:   1. CHB     No clear reversible causes     Likely progression of his baseline conduction system disease >> post  TAVR     He will need pacing   Dr. Caryl Comes has discussed with Mr. Lilley via   Telephone recommendation for PPM and the procedure potential risks, benefits.  She has had opportunity to discuss with her children recommendation of PPM and are agreeable to proceed with PPM.  Will give 22mq IV K+, pending today's lab   Will defer management of his other chronic and  acute medical issues to the primary team  1. AS s/p TAVR 2. Acute/chronic CHF (diastolic) 3. stroke 4. CAD 5. Permanent AF   For questions or updates, please contact [place] consult [email or web address] for contact info under     Signed, RBaldwin Jamaica PA-C  01/24/2020 9:42 AM   Complete heart block  Right bundle branch block at baseline  Aortic stenosis status post TAVR via LSCA approach  Atrial fibrillation-permanent declined anticoagulation  Stroke-intercurrent-acute  Peripheral vascular disease with bilateral severe carotid disease  Decreased mental status  Intravascular depletion   Decreased mental status is likely multifactorial including strokes, intravascular depletion, bradycardia.  He has complete heart block with a junctional escape with a right bundle now right axis instead of a right bundle left axis which was his conduction.  Agree with Dr. ORoxy Mannsthat patient is indicated.  Ejection fraction was normal.  We will do single-chamber device.  Have called and reviewed this with his wife who is discussed it with their children.  They would like u[place]ato proceed.  I have informed them that it is not clear to me how much of his mental status and his other issues will be remediated by pacing; however, with progressive bradycardia pacing is indicated.  The benefits and risks were reviewed with including but not limited to death,  perforation, infection, lead dislodgement and device malfunction.  I have further suggested that given his mental status that the risks related to procedure are increased but I do not think to a degree that is prohibitive.  The patient's wife  understands agrees and is willing to proceed.

## 2020-01-24 NOTE — Progress Notes (Signed)
      [address]       [place] [address]             [phone]     CARDIOTHORACIC SURGERY PROGRESS NOTE  6 Days Post-Op  S/P Procedure(s) (LRB): LOWER EXTREMITY ANGIOGRAM with intravascular shockwave lithotripsy (Bilateral) Intraoperative Transthoracic Echocardiogram (N/A) Intraoperative Transesophageal Echocardiogram Transcatheter Aortic Valve Replacement (subclavian approach) (Left) Aortogram  Subjective: Still lethargic but easily aroused.  Follows simple commands.  Moving left arm a little more than yesterday.  Denies pain, SOB  Objective: Vital signs in last 24 hours: Temp:  [97.6 F (36.4 C)-98.5 F (36.9 C)] 97.6 F (36.4 C) (05/17 0819) Pulse Rate:  [29-51] 34 (05/17 0819) Cardiac Rhythm: Junctional rhythm (05/16 2103) Resp:  [14-20] 18 (05/17 0819) BP: (120-159)/(43-60) 150/47 (05/17 0819) SpO2:  [96 %-100 %] 96 % (05/17 0819) Weight:  [59.7 kg] 59.7 kg (05/17 0412)  Physical Exam:  Rhythm:   Junctional bradycardia w/ HR 30's all night long  Breath sounds: clear  Heart sounds:  RRR  Incisions:  Clean and dry  Abdomen:  Soft, non-distended, non-tender  Extremities:  Warm, well-perfused    Intake/Output from previous day: 05/16 0701 - 05/17 0700 In: 923 [P.O.:920; I.V.:3] Out: 1100 [Urine:1100] Intake/Output this shift: No intake/output data recorded.  Lab Results: Recent Labs    01/22/20 0325 01/23/20 0536  WBC 12.8* 10.9*  HGB 10.0* 10.7*  HCT 31.3* 32.4*  PLT 185 224   BMET:  Recent Labs    01/22/20 0325 01/23/20 0536  NA 136 137  K 3.5 3.2*  CL 98 93*  CO2 26 31  GLUCOSE 217* 193*  BUN 23 23  CREATININE 1.26* 1.16  CALCIUM 8.4* 8.8*    CBG (last 3)  Recent Labs    01/23/20 1732 01/23/20 2106 01/24/20 0627  GLUCAP 172* 219* 160*   PT/INR:  No results for input(s): LABPROT, INR in the last 72 hours.  CXR:  N/A  Assessment/Plan: S/P Procedure(s) (LRB): LOWER EXTREMITY ANGIOGRAM with intravascular  shockwave lithotripsy (Bilateral) Intraoperative Transthoracic Echocardiogram (N/A) Intraoperative Transesophageal Echocardiogram Transcatheter Aortic Valve Replacement (subclavian approach) (Left) Aortogram  I think Garrett Hughes needs a permanent pacemaker.  He remains in junctional rhythm w/ HR 30's all night long.  BP stable.  Neuro exam seems stable, so will defer to Neurology team as to whether or not repeat CT head needed to r/o bleed because of the worsening bradycardia.  Will keep him NPO and ask EP service to see ASAP.   Garrett Nails, MD 01/24/2020 8:39 AM

## 2020-01-24 NOTE — Progress Notes (Signed)
Orthopedic Tech Progress Note Patient Details:  Garrett Hughes 1941/04/18 [id number] RN called requesting a SHOULDER IMMOBILIZER to keep patient arm in place.  Ortho Devices Type of Ortho Device: Shoulder immobilizer Ortho Device/Splint Location: RUE Ortho Device/Splint Interventions: Application, Adjustment   Post Interventions Patient Tolerated: Well Instructions Provided: Care of device   Donald Pore 01/24/2020, 3:24 PM

## 2020-01-24 NOTE — Progress Notes (Signed)
PT Cancellation Note  Patient Details Name: Garrett Hughes MRN: [id number] DOB: 26-May-1941   Cancelled Treatment:    Reason Eval/Treat Not Completed: Medical issues which prohibited therapy. Pt with bradycardia in the 30's.    Angelina Ok [facility] 01/24/2020, 9:37 AM Skip Mayer PT Acute Rehabilitation Services Pager [phone] Office [phone]

## 2020-01-24 NOTE — Progress Notes (Signed)
Patient back to room 4E22 from cath lab. Vital signs obtained. Zoll pads on and hooked up to monitor in case of need. Will continue to monitor.  Ginette Otto, RN

## 2020-01-24 NOTE — Progress Notes (Signed)
Pt was bradycardic before the start of shift but he was mostly in the 40s.  During the night, his heart rate would drop down to the 30s, as low as 31, and stayed there.  BP was normal and pt is asymptomatic.  Informed the on call physician, who said to continue to monitor the pt.  Day shift will decided if they need to do anything.  Will continue to monitor.  Harriet Masson, RN

## 2020-01-25 ENCOUNTER — Inpatient Hospital Stay (HOSPITAL_COMMUNITY): Payer: Medicare Other

## 2020-01-25 DIAGNOSIS — Z959 Presence of cardiac and vascular implant and graft, unspecified: Secondary | ICD-10-CM

## 2020-01-25 DIAGNOSIS — I350 Nonrheumatic aortic (valve) stenosis: Secondary | ICD-10-CM

## 2020-01-25 DIAGNOSIS — Z954 Presence of other heart-valve replacement: Secondary | ICD-10-CM

## 2020-01-25 DIAGNOSIS — J449 Chronic obstructive pulmonary disease, unspecified: Secondary | ICD-10-CM

## 2020-01-25 DIAGNOSIS — I6521 Occlusion and stenosis of right carotid artery: Secondary | ICD-10-CM

## 2020-01-25 LAB — BASIC METABOLIC PANEL
Anion gap: 10 (ref 5–15)
BUN: 31 mg/dL — ABNORMAL HIGH (ref 8–23)
CO2: 25 mmol/L (ref 22–32)
Calcium: 7.6 mg/dL — ABNORMAL LOW (ref 8.9–10.3)
Chloride: 105 mmol/L (ref 98–111)
Creatinine, Ser: 1.17 mg/dL (ref 0.61–1.24)
GFR calc Af Amer: >60 mL/min (ref >60)
GFR calc non Af Amer: 59 mL/min — ABNORMAL LOW (ref >60)
Glucose, Bld: 140 mg/dL — ABNORMAL HIGH (ref 70–99)
Potassium: 3.8 mmol/L (ref 3.5–5.1)
Sodium: 140 mmol/L (ref 135–145)

## 2020-01-25 LAB — GLUCOSE, CAPILLARY
Glucose-Capillary: 169 mg/dL — ABNORMAL HIGH (ref 70–99)
Glucose-Capillary: 66 mg/dL — ABNORMAL LOW (ref 70–99)
Glucose-Capillary: 164 mg/dL — ABNORMAL HIGH (ref 70–99)
Glucose-Capillary: 151 mg/dL — ABNORMAL HIGH (ref 70–99)
Glucose-Capillary: 112 mg/dL — ABNORMAL HIGH (ref 70–99)

## 2020-01-25 MED ORDER — METOPROLOL SUCCINATE ER 25 MG PO TB24
25.0000 mg | ORAL_TABLET | Freq: Every day | ORAL | Status: DC
  Administered 2020-01-25 – 2020-01-28 (×4): 25 mg via ORAL
  Filled 2020-01-25 (×4): qty 1

## 2020-01-25 MED ORDER — FUROSEMIDE 20 MG PO TABS
20.0000 mg | ORAL_TABLET | Freq: Every day | ORAL | Status: DC
  Administered 2020-01-25 – 2020-01-28 (×4): 20 mg via ORAL
  Filled 2020-01-25 (×4): qty 1

## 2020-01-25 MED ORDER — DEXTROSE 50 % IV SOLN
12.5000 g | INTRAVENOUS | Status: AC
  Administered 2020-01-25: 12.5 g via INTRAVENOUS

## 2020-01-25 MED ORDER — ZOLPIDEM TARTRATE 5 MG PO TABS
5.0000 mg | ORAL_TABLET | Freq: Once | ORAL | Status: AC
  Administered 2020-01-25: 5 mg via ORAL
  Filled 2020-01-25: qty 1

## 2020-01-25 MED ORDER — DEXTROSE 50 % IV SOLN
INTRAVENOUS | Status: AC
  Filled 2020-01-25: qty 50

## 2020-01-25 MED FILL — Lidocaine HCl Local Inj 1%: INTRAMUSCULAR | Qty: 60 | Status: AC

## 2020-01-25 NOTE — Progress Notes (Addendum)
Progress Note  Patient Name: Garrett Hughes Date of Encounter: 01/25/2020  Primary Cardiologist: Olga Millers, MD   Subjective   Resting comfortably, easily woken, does not tell me his name.  When asked if he has any pain, he says "no pain, no gain" and smiles.  This is a repetative statement, is currently presently confused  Inpatient Medications    Scheduled Meds: . aspirin EC  325 mg Oral Daily  . Chlorhexidine Gluconate Cloth  6 each Topical Daily  . clopidogrel  75 mg Oral Daily  . ferrous sulfate  325 mg Oral Q breakfast  . furosemide  20 mg Oral Daily  . insulin aspart  0-24 Units Subcutaneous TID AC & HS  . insulin aspart  5 Units Subcutaneous TID WC  . insulin glargine  5 Units Subcutaneous Daily  . lisinopril  5 mg Oral Daily  . metoprolol succinate  25 mg Oral Daily  . pantoprazole  20 mg Oral Daily  . pravastatin  40 mg Oral Daily  . sodium chloride flush  3 mL Intravenous Q12H   Continuous Infusions: . sodium chloride    . sodium chloride Stopped (01/20/20 0135)  . sodium chloride Stopped (01/24/20 1533)  . sodium chloride 75 mL/hr at 01/25/20 0400   PRN Meds: sodium chloride, acetaminophen **OR** acetaminophen, ondansetron (ZOFRAN) IV, sodium chloride flush   Vital Signs    Vitals:   01/24/20 1953 01/24/20 2331 01/25/20 0531 01/25/20 0838  BP: 117/62 131/63 (!) 150/56 (!) 159/56  Pulse: (!) 59 60 60 (!) 59  Resp: (!) 22 17 14 19   Temp: 98.3 F (36.8 C) 97.8 F (36.6 C) 97.6 F (36.4 C) 97.8 F (36.6 C)  TempSrc: Oral Oral Oral Oral  SpO2: 92% 95% 96% 97%  Weight:   59.7 kg   Height:        Intake/Output Summary (Last 24 hours) at 01/25/2020 0950 Last data filed at 01/25/2020 0531 Gross per 24 hour  Intake 977.98 ml  Output 800 ml  Net 177.98 ml   Last 3 Weights 01/25/2020 01/24/2020 01/23/2020  Weight (lbs) 131 lb 9.8 oz 131 lb 9.8 oz 129 lb 3 oz  Weight (kg) 59.7 kg 59.7 kg 58.6 kg      Telemetry    V paced - Personally  Reviewed  ECG    V paced - Personally Reviewed  Physical Exam   GEN: No acute distress.   Neck: No JVD Cardiac: RRR, 2/6 SM, no rubs, or gallops.  Respiratory: Clear to auscultation bilaterally. GI: Soft, nontender, non-distended  MS: No edema; No deformity. Neuro:  confused Psych: currently pleasently confused  R chest: dressing remains in place over pacer site, is clean and dry, non tender, no hematoma   Labs    High Sensitivity Troponin:   Recent Labs  Lab 12/28/19 0222 12/28/19 0355 12/28/19 2001 12/28/19 2153  TROPONINIHS 1,590* 1,542* 1,176* 1,013*      Chemistry Recent Labs  Lab 01/23/20 0536 01/24/20 1053 01/25/20 0251  NA 137 136 140  K 3.2* 4.4 3.8  CL 93* 96* 105  CO2 31 25 25   GLUCOSE 193* 192* 140*  BUN 23 36* 31*  CREATININE 1.16 1.46* 1.17  CALCIUM 8.8* 8.3* 7.6*  GFRNONAA 60* 45* 59*  GFRAA >60 52* >60  ANIONGAP 13 15 10      Hematology Recent Labs  Lab 01/21/20 0407 01/22/20 0325 01/23/20 0536  WBC 13.5* 12.8* 10.9*  RBC 3.08* 3.27* 3.43*  HGB 9.6* 10.0* 10.7*  HCT 29.4* 31.3* 32.4*  MCV 95.5 95.7 94.5  MCH 31.2 30.6 31.2  MCHC 32.7 31.9 33.0  RDW 16.1* 15.6* 15.5  PLT 153 185 224    BNPNo results for input(s): BNP, PROBNP in the last 168 hours.   DDimer No results for input(s): DDIMER in the last 168 hours.   Radiology    01/25/2020: CXR IMPRESSION: Pacemaker lead tip attached to right ventricle. No pneumothorax. Bibasilar atelectasis. Stable cardiac prominence with postoperative changes.  Aortic Atherosclerosis (ICD10-I70.0).  Cardiac Studies   01/18/2020 : Inter-op TEE PRE-PROCEDURAL FINDINGS:   Normal left ventricular systolic function. Estimated LVEF 55-60%.  There are no regional wall motion abnormalities.  Severe calcific aortic stenosis. Trileaflet aortic valve.  Peak aortic valve gradient 60 mm Hg, mean gradient 31 mm Hg.  Dimensionless obstructive index 0.15, calculated aortic valve area is 0.8  cm.   Mild aortic insufficiency.  Moderate mitral insufficiency.  Small posterior pericardial effusion.   POST-PROCEDURAL FINDINGS:   Hyperdynamic left ventricular systolic function. Estimated LVEF 70%.  There are no regional wall motion abnormalities.  Well-seated TAVR stent-valve.  Peak aortic valve gradient 5 mm Hg, mean gradient 2 mm Hg.  Dimensionless obstructive index 0.43, calculated aortic valve area is 1.43  cm.  There is no aortic insufficiency and no perivalvular leak.  Mild mitral insufficiency, substantially improved.  Small posterior pericardial effusion.  IMPRESSIONS    1. Left ventricular ejection fraction, by estimation, is 50 to 55%. The  left ventricle has low normal function. The left ventricle has no regional  wall motion abnormalities. There is moderate concentric left ventricular  hypertrophy.  2. Right ventricular systolic function is normal. The right ventricular  size is normal.  3. Left atrial size was severely dilated. No left atrial/left atrial  appendage thrombus was detected.  4. Right atrial size was moderately dilated.  5. The mitral valve is grossly normal. Moderate mitral valve  regurgitation.  6. The aortic valve is tricuspid. Aortic valve regurgitation is mild.  Severe aortic valve stenosis. There is a 26 mm Edwards Medtr[employer], stented  (TAVR) valve present in the aortic position. Procedure Date: 01/18/2020.  7. There is mild (Grade II) atheroma plaque.   Patient Profile     79 y.o. male with a hx of CABG 1997  >> 12/24/2019 s/p complex atherectomy and PCI last week complicated by dissection/contained perforation treated with overlapping DES and a Papyrus covered stent, chronic atrial fibrillation (notes report pt declines anticoagulation),severeaortic stenosis,RBBB,carotid artery disease,HTN, DM, HLD, chronic appearing anemia/thrombocytopenia, GERD, Barrett's esophagus, h/o GIB, PVD (80-99% RICA stenosis. Followed by Dr. Trula Slade (  possible TCAR candidate), and severe AS  He was admitted 01/18/20 underwent TAVR Post procedure developed worsening AMS CVA CHB  Assessment & Plan    1. Permanent AFib     (not a/c patient preference per notes) 2. CHB     Developed post TAVR with baseline conduction system disease     S/p PPM yesterday     CXR this Am without ptx     Device check this AM with stable measurements.  RV outputs left at 5V given he is pacer dependent at this juncture and remains with AMS     Site is stable, dressing is dry, no hematoma  Wound care and activity restrictions are provided in the AVS Post pacing EP follow up is arranged  1. I have left the tegaderm dressing in place. PLEASR remove day of discharge leaving the steri strips underneath in place 2. I  have left the arm sling in place given intermittent/persistent AMS to try and reduce risk of dislodgement.  This can be removed if/when mental status improves, or when supervised Please call if any questions  No anticoagulant (should it be decided for him) for 2 days please   Will sign off though remain available.  Please recall if needed.     For questions or updates, please contact [facility] Please consult [email or web address] for contact info under        Signed, Sheilah Pigeon, PA-C  01/25/2020, 9:50 AM     Seen and agree with above   Can activate rate response as needed for functional status  Call if can help

## 2020-01-25 NOTE — Progress Notes (Signed)
Pt was noncompliant with not turning on his right side and to rest his right arm.  Asked the on call physician if pt could have a med to help him rest.  Physician ordered a one time oral Ambien 5 mg.  Will continue to monitor.    Harriet Masson, RN

## 2020-01-25 NOTE — Discharge Instructions (Signed)
ACTIVITY AND EXERCISE . Daily activity and exercise are an important part of your recovery. People recover at different rates depending on their general health and type of valve procedure. . Most people recovering from TAVR feel better relatively quickly  . No lifting, pushing, pulling more than 10 pounds (examples to avoid: groceries, vacuuming, gardening, golfing):             - For one week with a procedure through the groin.             - For six weeks for procedures through the chest wall or neck. NOTE: You will typically see one of our providers 7-14 days after your procedure to discuss [place] the above activities.      DRIVING . Do not drive until you are seen for follow up and cleared by a provider. Generally, we ask patient to not drive for 1 week after their procedure. . If you have been told by your doctor in the past that you may not drive, you must talk with him/her before you begin driving again.   DRESSING . Groin site: you may leave the clear dressing over the site for up to one week or until it falls off.   HYGIENE . If you had a femoral (leg) procedure, you may take a shower when you return home. After the shower, pat the site dry. Do NOT use powder, oils or lotions in your groin area until the site has completely healed. . If you had a chest procedure, you may shower when you return home unless specifically instructed not to by your discharging practitioner.             - DO NOT scrub incision; pat dry with a towel.             - DO NOT apply any lotions, oils, powders to the incision.             - No tub baths / swimming for at least 2 weeks. . If you notice any fevers, chills, increased pain, swelling, bleeding or pus, please contact your doctor.   ADDITIONAL INFORMATION . If you are going to have an upcoming dental procedure, please contact our office as you will require antibiotics ahead of time to prevent infection on your heart valve.    If you have any  questions or concerns you can call the structural heart phone during normal business hours 8am-4pm. If you have an urgent need after hours or weekends please call [phone] to talk to the on call provider for general cardiology. If you have an emergency that requires immediate attention, please call 911.    After TAVR Checklist  Check  Test Description   Follow up appointment in 1-2 weeks  You will see our structural heart physician assistant, Nell Range. Your incision sites will be checked and you will be cleared to drive and resume all normal activities if you are doing well.     1 month echo and follow up  You will have an echo to check on your new heart valve and be seen back in the office by Nell Range. Many times the echo is not read by your appointment time, but Joellen Jersey will call you later that day or the following day to report your results.   Follow up with your primary cardiologist You will need to be seen by your primary cardiologist in the following 3-6 months after your 1 month appointment in the valve  clinic. Often times your Plavix or Aspirin will be discontinued during this time, but this is decided on a case by case basis.    1 year echo and follow up You will have another echo to check on your heart valve after 1 year and be seen back in the office by Carlean Jews. This your last structural heart visit.   Bacterial endocarditis prophylaxis  You will have to take antibiotics for the rest of your life before all dental procedures (even teeth cleanings) to protect your heart valve. Antibiotics are also required before some surgeries. Please check with your cardiologist before scheduling any surgeries. Also, please make sure to tell us if you have a penicillin allergy as you will require an alternative antibiotic.          Supplemental Discharge Instructions for  Pacemaker/Defibrillator Patients  Activity No heavy lifting or vigorous activity with your left/right arm  for 6 to 8 weeks.  Do not raise your left/right arm above your head for one week.  Gradually raise your affected arm as drawn below.             01/28/2020                 01/29/2020                 01/30/2020              01/31/2020 __  NO DRIVING until cleared to by all your doctors.  WOUND CARE - Keep the wound area clean and dry.  Do not get this area wet - No showers until cleared to by all of your doctors - There are steri strips (paper tapes) covering your wound, DO NOT remove these - No bandage is needed on the site otherwise.  DO  NOT apply any creams, oils, or ointments to the wound area. - If you notice any drainage or discharge from the wound, any swelling or bruising at the site, or you develop a fever > 101? F after you are discharged home, call the office at once.  Special Instructions - You are still able to use cellular telephones; use the ear opposite the side where you have your pacemaker/defibrillator.  Avoid carrying your cellular phone near your device. - When traveling through airports, show security personnel your identification card to avoid being screened in the metal detectors.  Ask the security personnel to use the hand wand. - Avoid arc welding equipment, MRI testing (magnetic resonance imaging), TENS units (transcutaneous nerve stimulators).  Call the office for questions about other devices. - Avoid electrical appliances that are in poor condition or are not properly grounded. - Microwave ovens are safe to be near or to operate.

## 2020-01-25 NOTE — Progress Notes (Addendum)
[facility] VALVE TEAM  Patient Name: Garrett Hughes Date of Encounter: 01/25/2020  Primary Cardiologist: Dr. Stanford Breed / Dr. Burt Knack & Dr. Roxy Manns (TAVR)  Hospital Problem List     Principal Problem:   S/P TAVR (transcatheter aortic valve replacement) Active Problems:   Benign essential hypertension   History of Barrett's esophagus   Hx of CABG   Hyperlipemia   Low back pain   Type 2 diabetes mellitus with kidney complication, without long-term current use of insulin (HCC)   GERD (gastroesophageal reflux disease)   Chronic atrial fibrillation (HCC)   CAD (coronary artery disease)   Acute on chronic diastolic CHF (congestive heart failure) (HCC)   Severe aortic stenosis   Carotid artery disease (HCC)   COPD (chronic obstructive pulmonary disease) ([facility])   Cerebral embolism with cerebral infarction     Subjective   Oriented to self but not place or time. He thinks he is at the dumpsters in [place]. No pain.   Inpatient Medications    Scheduled Meds: . aspirin EC  325 mg Oral Daily  . Chlorhexidine Gluconate Cloth  6 each Topical Daily  . clopidogrel  75 mg Oral Daily  . ferrous sulfate  325 mg Oral Q breakfast  . insulin aspart  0-24 Units Subcutaneous TID AC & HS  . insulin aspart  5 Units Subcutaneous TID WC  . insulin glargine  5 Units Subcutaneous Daily  . lisinopril  5 mg Oral Daily  . pantoprazole  20 mg Oral Daily  . pravastatin  40 mg Oral Daily  . sodium chloride flush  3 mL Intravenous Q12H   Continuous Infusions: . sodium chloride    . sodium chloride Stopped (01/20/20 0135)  . sodium chloride Stopped (01/24/20 1533)  . sodium chloride 75 mL/hr at 01/25/20 0400  .  ceFAZolin (ANCEF) IV Stopped (01/24/20 2151)   PRN Meds: sodium chloride, acetaminophen **OR** acetaminophen, ondansetron (ZOFRAN) IV, sodium chloride flush   Vital Signs    Vitals:   01/24/20 1730 01/24/20 1953 01/24/20 2331 01/25/20 0531  BP:  (!) 101/59 117/62 131/63 (!) 150/56  Pulse: (!) 59 (!) 59 60 60  Resp: 14 (!) 22 17 14   Temp:  98.3 F (36.8 C) 97.8 F (36.6 C) 97.6 F (36.4 C)  TempSrc:  Oral Oral Oral  SpO2:  92% 95% 96%  Weight:    59.7 kg  Height:        Intake/Output Summary (Last 24 hours) at 01/25/2020 0610 Last data filed at 01/25/2020 0531 Gross per 24 hour  Intake 977.98 ml  Output 800 ml  Net 177.98 ml   Filed Weights   01/23/20 0556 01/24/20 0412 01/25/20 0531  Weight: 58.6 kg 59.7 kg 59.7 kg    Physical Exam   GEN: chronically ill appearing,  HEENT: Grossly normal.  Neck: Supple, no JVD, carotid bruits, or masses. Cardiac: no murmurs, rubs, or gallops. No clubbing, cyanosis, edema.   Respiratory:  Respirations regular and unlabored, clear to auscultation bilaterally. GI: Soft, nontender, nondistended, BS + x 4. MS: no deformity or atrophy. Skin: warm and dry, no rash. Neuro:  Oriented to self only. Left arm with significant weakness although he is able to move it when asked Psych: AAOx3.  Normal affect.  Labs    CBC Recent Labs    01/23/20 0536  WBC 10.9*  HGB 10.7*  HCT 32.4*  MCV 94.5  PLT 563   Basic Metabolic Panel Recent Labs  01/24/20 1053 01/25/20 0251  NA 136 140  K 4.4 3.8  CL 96* 105  CO2 25 25  GLUCOSE 192* 140*  BUN 36* 31*  CREATININE 1.46* 1.17  CALCIUM 8.3* 7.6*   Liver Function Tests No results for input(s): AST, ALT, ALKPHOS, BILITOT, PROT, ALBUMIN in the last 72 hours. No results for input(s): LIPASE, AMYLASE in the last 72 hours. Cardiac Enzymes No results for input(s): CKTOTAL, CKMB, CKMBINDEX, TROPONINI in the last 72 hours. BNP Invalid input(s): POCBNP D-Dimer No results for input(s): DDIMER in the last 72 hours. Hemoglobin A1C Recent Labs    01/23/20 0536  HGBA1C 6.6*   Fasting Lipid Panel Recent Labs    01/23/20 0536  CHOL 141  HDL 51  LDLCALC 73  TRIG 83  CHOLHDL 2.8   Thyroid Function Tests No results for input(s): TSH,  T4TOTAL, T3FREE, THYROIDAB in the last 72 hours.  Invalid input(s): FREET3  Telemetry    paced  - Personally Reviewed  ECG    V paced  - Personally Reviewed  Radiology    CT ANGIO HEAD W OR WO CONTRAST  Result Date: 01/23/2020 CLINICAL DATA:  Follow-up stroke. Left hemiparesis and neglect. Multiple bilateral micro embolic infarctions. EXAM: CT ANGIOGRAPHY HEAD AND NECK TECHNIQUE: Multidetector CT imaging of the head and neck was performed using the standard protocol during bolus administration of intravenous contrast. Multiplanar CT image reconstructions and MIPs were obtained to evaluate the vascular anatomy. Carotid stenosis measurements (when applicable) are obtained utilizing NASCET criteria, using the distal internal carotid diameter as the denominator. CONTRAST:  30mL OMNIPAQUE IOHEXOL 350 MG/ML SOLN COMPARISON:  MRI yesterday. FINDINGS: CT HEAD FINDINGS Brain: Brain shows age related atrophy. Chronic small-vessel ischemic changes seen throughout the hemispheric white matter. Some of the low densities particularly on the right probably correlate with the areas of acute infarction. No large confluent infarction. No mass effect, hemorrhage, hydrocephalus or extra-axial collection. Vascular: There is atherosclerotic calcification of the major vessels at the base of the brain. Skull: Negative Sinuses: Previous functional endoscopic sinus surgery. Orbits: Negative Review of the MIP images confirms the above findings CTA NECK FINDINGS Aortic arch: Aortic atherosclerosis. Previous TAVR. Coronary artery atherosclerosis and stents. Branching pattern of the aorta is normal without origin stenosis. Right carotid system: Common carotid artery shows some atherosclerotic plaque but is widely patent to the bifurcation region. Dense calcified plaque at the carotid bifurcation and ICA bulb. 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 No reconstitution. Left carotid system: Common carotid artery shows some scattered  plaque but is widely patent to the bifurcation region. Calcified plaque at the carotid bifurcation, ICA origin and ICA bulb. Severe stenosis of the proximal ICA, difficulty to accurately measure because of the dense calcification. Patent lumen is probably 1 mm or smaller. Beyond that, cervical ICA is patent to the skull base. Vertebral arteries: Large bilateral vertebral arteries. Dense calcification at both vertebral artery origins. 70% stenosis on the right. 50% stenosis on the left. Skeleton: Exaggerated cervical lordosis and thoracic kyphosis. Degenerative cervical spondylosis. Partial compression fracture of T3, likely old. Other neck: No mass or lymphadenopathy. Upper chest: Emphysema and pulmonary scarring. Review of the MIP images confirms the above findings CTA HEAD FINDINGS Anterior circulation: Left internal carotid artery is patent through the skull base and siphon regions. Ordinary siphon atherosclerotic calcification without stenosis greater than 30%. Supraclinoid ICA widely patent. Good supply to the left anterior and middle cerebral arteries. Patent anterior communicating artery allowing supply to the right anterior circulation.  No right internal carotid flow at the skull base. Probable external to internal collaterals as well. Patent right posterior communicating artery as well. No large or medium vessel occlusion. Posterior circulation: Both vertebral arteries widely patent through the foramen magnum. Atherosclerotic calcification of the left V4 segment with stenosis estimated at 30%. No basilar stenosis. Dense calcification at the basilar tip. This was not present on a head CT 12/28/2019, therefore this is an embolic calcification. There is result in stenosis of both P1 origins, but those vessels do continue to show flow. Venous sinuses: Patent normal. Anatomic variants: None significant. Review of the MIP images confirms the above findings IMPRESSION: Right ICA occlusion at the origin, precise  age indeterminate. This may well be chronic. Aortic atherosclerosis. Advanced atherosclerotic disease at the left carotid bifurcation and proximal ICA. Severe stenosis of the proximal ICA with luminal diameter estimated at 1 mm. This would be an 80% or greater stenosis. Stenotic disease at both vertebral artery origins. 70% stenosis on the right. 50% stenosis on the left. Dense calcification at the basilar tip consistent with embolized plaque. This was not present on a CT scan 12/28/2019. No acute intracranial large vessel occlusion. The embolized plaque at the basilar tip does result in stenosis of both P1 origins, but those vessels continue to show flow. Electronically Signed   By: Paulina Fusi M.D.   On: 01/23/2020 11:24   CT ANGIO NECK W OR WO CONTRAST  Result Date: 01/23/2020 CLINICAL DATA:  Follow-up stroke. Left hemiparesis and neglect. Multiple bilateral micro embolic infarctions. EXAM: CT ANGIOGRAPHY HEAD AND NECK TECHNIQUE: Multidetector CT imaging of the head and neck was performed using the standard protocol during bolus administration of intravenous contrast. Multiplanar CT image reconstructions and MIPs were obtained to evaluate the vascular anatomy. Carotid stenosis measurements (when applicable) are obtained utilizing NASCET criteria, using the distal internal carotid diameter as the denominator. CONTRAST:  75mL OMNIPAQUE IOHEXOL 350 MG/ML SOLN COMPARISON:  MRI yesterday. FINDINGS: CT HEAD FINDINGS Brain: Brain shows age related atrophy. Chronic small-vessel ischemic changes seen throughout the hemispheric white matter. Some of the low densities particularly on the right probably correlate with the areas of acute infarction. No large confluent infarction. No mass effect, hemorrhage, hydrocephalus or extra-axial collection. Vascular: There is atherosclerotic calcification of the major vessels at the base of the brain. Skull: Negative Sinuses: Previous functional endoscopic sinus surgery. Orbits:  Negative Review of the MIP images confirms the above findings CTA NECK FINDINGS Aortic arch: Aortic atherosclerosis. Previous TAVR. Coronary artery atherosclerosis and stents. Branching pattern of the aorta is normal without origin stenosis. Right carotid system: Common carotid artery shows some atherosclerotic plaque but is widely patent to the bifurcation region. Dense calcified plaque at the carotid bifurcation and ICA bulb. 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 No reconstitution. Left carotid system: Common carotid artery shows some scattered plaque but is widely patent to the bifurcation region. Calcified plaque at the carotid bifurcation, ICA origin and ICA bulb. Severe stenosis of the proximal ICA, difficulty to accurately measure because of the dense calcification. Patent lumen is probably 1 mm or smaller. Beyond that, cervical ICA is patent to the skull base. Vertebral arteries: Large bilateral vertebral arteries. Dense calcification at both vertebral artery origins. 70% stenosis on the right. 50% stenosis on the left. Skeleton: Exaggerated cervical lordosis and thoracic kyphosis. Degenerative cervical spondylosis. Partial compression fracture of T3, likely old. Other neck: No mass or lymphadenopathy. Upper chest: Emphysema and pulmonary scarring. Review of the MIP images confirms  the above findings CTA HEAD FINDINGS Anterior circulation: Left internal carotid artery is patent through the skull base and siphon regions. Ordinary siphon atherosclerotic calcification without stenosis greater than 30%. Supraclinoid ICA widely patent. Good supply to the left anterior and middle cerebral arteries. Patent anterior communicating artery allowing supply to the right anterior circulation. No right internal carotid flow at the skull base. Probable external to internal collaterals as well. Patent right posterior communicating artery as well. No large or medium vessel occlusion. Posterior circulation: Both vertebral  arteries widely patent through the foramen magnum. Atherosclerotic calcification of the left V4 segment with stenosis estimated at 30%. No basilar stenosis. Dense calcification at the basilar tip. This was not present on a head CT 12/28/2019, therefore this is an embolic calcification. There is result in stenosis of both P1 origins, but those vessels do continue to show flow. Venous sinuses: Patent normal. Anatomic variants: None significant. 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 This may well be chronic. Aortic atherosclerosis. Advanced atherosclerotic disease at the left carotid bifurcation and proximal ICA. Severe stenosis of the proximal ICA with luminal diameter estimated at 1 mm. This would be an 80% or greater stenosis. Stenotic disease at both vertebral artery origins. 70% stenosis on the right. 50% stenosis on the left. Dense calcification at the basilar tip consistent with embolized plaque. This was not present on a CT scan 12/28/2019. No acute intracranial large vessel occlusion. The embolized plaque at the basilar tip does result in stenosis of both P1 origins, but those vessels continue to show flow. Electronically Signed   By: Paulina Fusi M.D.   On: 01/23/2020 11:24   VAS US CAROTID  Result Date: 01/23/2020 Carotid Arterial Duplex Study Indications:       Known carotid stenosis, S/P TAVR with new right sided                    weakness and AMS, suspect stroke. Other Factors:     01/23/20 CTA results right ICA occluded and left ICA severe                    stenosis. Comparison Study:  12/08/19 right 80-99% left 40-59% Performing Technologist: Jeb Levering RDMS, RVT  Examination Guidelines: A complete evaluation includes B-mode imaging, spectral Doppler, color Doppler, and power Doppler as needed of all accessible portions of each vessel. Bilateral testing is considered an integral part of a complete examination.  Limited examinations for reoccurring indications may be performed as noted.  Right Carotid Findings: +----------+--------+--------+--------+------------------+-------------------+           PSV cm/sEDV cm/sStenosisPlaque DescriptionComments            +----------+--------+--------+--------+------------------+-------------------+ CCA Prox  36      3                                 High resistant flow +----------+--------+--------+--------+------------------+-------------------+ CCA Distal32      5                                 High resistant flow +----------+--------+--------+--------+------------------+-------------------+ ICA Prox                  Occluded                                      +----------+--------+--------+--------+------------------+-------------------+  ECA       629     36      >50%                                          +----------+--------+--------+--------+------------------+-------------------+ +----------+--------+-------+----------------+-------------------+           PSV cm/sEDV cmsDescribe        Arm Pressure (mmHG) +----------+--------+-------+----------------+-------------------+ [id number]            Multiphasic, WNL                    +----------+--------+-------+----------------+-------------------+ +---------+--------+--+--------+--+---------+ VertebralPSV cm/s74EDV cm/s22Antegrade +---------+--------+--+--------+--+---------+  Left Carotid Findings: +----------+--------+--------+--------+------------------------------+---------+           PSV cm/sEDV cm/sStenosisPlaque Description            Comments  +----------+--------+--------+--------+------------------------------+---------+ CCA Prox  131     25                                                      +----------+--------+--------+--------+------------------------------+---------+ CCA Distal103     22                                                       +----------+--------+--------+--------+------------------------------+---------+ ICA Prox  473     138     80-99%  calcific, heterogenous and    Shadowing                                   irregular                               +----------+--------+--------+--------+------------------------------+---------+ ICA Mid   109     36                                                      +----------+--------+--------+--------+------------------------------+---------+ ICA Distal108     34                                                      +----------+--------+--------+--------+------------------------------+---------+ ECA       400     40      >50%                                            +----------+--------+--------+--------+------------------------------+---------+ +----------+--------+--------+----------------+-------------------+           PSV cm/sEDV cm/sDescribe        Arm Pressure (mmHG) +----------+--------+--------+----------------+-------------------+ Subclavian230             Multiphasic, WNL                    +----------+--------+--------+----------------+-------------------+ +---------+--------+--+--------+--+---------+  VertebralPSV cm/s78EDV cm/s19Antegrade +---------+--------+--+--------+--+---------+   Summary: Right Carotid: Evidence consistent with a total occlusion of the right ICA. Left Carotid: Velocities in the left ICA are consistent with a 80-99% stenosis.  *See table(s) above for measurements and observations.  Electronically signed by Fabienne Brunsharles Fields MD on 01/23/2020 at 6:43:02 PM.    Final     Cardiac Studies   TAVR OPERATIVE NOTE   Date of Procedure:01/18/2020  Preoperative Diagnosis:Severe Aortic Stenosis   Postoperative Diagnosis:Same   Procedure:   Transcatheter Aortic Valve Replacement - Left Trans-SubclavianApproach Edwards Sapien 3 UltraTHV (size 26mm, model #  [id number], serial # [id number])  Co-Surgeons:Clarence H. Cornelius Moraswen, MD and Tonny BollmanMichael Aeon Kessner, MD  Anesthesiologist:David Noreene LarssonJoslin, MD  Echocardiographer:Mihai Croitoru, MD  Pre-operative Echo Findings: ? Severe aortic stenosis ? Normalleft ventricular systolic function  Post-operative Echo Findings: ? Noparavalvular leak ? Normalleft ventricular systolic function  _______________   Echo5/12/21: IMPRESSIONS  1. Left ventricular ejection fraction, by estimation, is 70 to 75%. The left ventricle has hyperdynamic function. The left ventricle has no regional wall motion abnormalities. There is mild concentric left  ventricular hypertrophy. Left ventricular diastolic function could not be evaluated. There is the interventricular septum is flattened in systole, consistent with right ventricular pressure overload.  2. Right ventricular systolic function is mildly reduced. The right  ventricular size is moderately enlarged. There is severely elevated  pulmonary artery systolic pressure. The estimated right ventricular  systolic pressure is 85.2 mmHg.  3. The mitral valve is normal in structure. Mild to moderate mitral valve regurgitation.  4. Tricuspid valve regurgitation is moderate to severe.  5. The aortic valve has been repaired/replaced. Aortic valve  regurgitation is not visualized. There is a 26 mm Edwards Sapien  prosthetic (TAVR) valve present in the aortic position. Procedure Date:  01/18/2020. Echo findings are consistent with normal  structure and function of the aortic valve prosthesis. Aortic valve mean  gradient measures 9.3 mmHg. Aortic valve Vmax measures 2.24 m/s.  6. The inferior vena cava is dilated in size with <50% respiratory  variability, suggesting right atrial pressure of 15 mmHg.   Comparison(s): There is marked worsening of pulmonary artery hypertension and severity of tricuspid regurgitation  since the intra-procedural study.   Patient Profile     Noel ChristmasBarry P Roehrs is a 79 y.o. male with a history of CAD s/p CABG 1997 and recent PCI of RCA (12/24/19), RBBB, chronic atrial fibrillation (pt declines anticoagulation),high grade carotid artery disease,HTN, DMT2, HLD, chronic appearing anemia/thrombocytopenia, GERD, Barrett's esophagus, h/o GIB and urinary tract bleeding and severe AS [facility].  Assessment & Plan   Severe AS:s/p successful TAVR with a2626mm Edwards Sapien 3 THV via the left subclavianapproach on 01/18/20. Operative course complicated by balloon rupture during valve deployment and axillary artery damage, this was successfully repaired and there was no significant vascular injury by flouroscopy.Post operative echo showed EF 70%, normally functioning TAVR with a mean gradient of 9.3 mm Hg and no PVL. There was mod-severe TR and pulm HTN. Groin site/subclavian siteare stable. He then developed profound bradycardia with a junctional rhythm and required PPM on 5/17. Continue Asprin andplavix.  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 Graciela HusbandsKlein.  Acute CVA: pt developed AMS post operatively. Stat head CT was negative. Follow up MRI showed numerous small acute infarcts in the right greater than left cerebral hemispheres and left cerebellum compatible with a central embolic source. CTA and carotid dopplers also showed  total occlusion on RICA. Etiology could be multiple, large vessel occlusion/stenosis in the setting of procedure/low BP vs. TAVR procedure related vs. chronic afib not on AC. Neurology has recommended asa 325 mg daily and plavix 75mg  daily.   HTN: BP elevated. Will resume home Torpol XL now that he has a pacemaker.   Acute on chronic anemia: he was transfused 1U PRBCS. Hg stable at 10.7. No recent labs  Acute on chronic diastolic CHF: patient presented with progressive dyspnea and New [employer] Heart Association  functional class III symptoms, elevated BNP, and increasing diuretic requirement. His postoperative day #1 echo demonstrates evidence of pulmonary hypertension and RV volume overload. There as interstitial edema noted on x-ray. He was treated with IV lasix 60mg  daily x 4 days. Now appears euvolemic. Will restart home lasix 20mg  daily.   CAD:s/p complex atherectomy and PCIoncomplicated by dissection/contained perforation treated with overlapping DES and a Papyrus covered stenton 12/24/19. No recurrent angina. Continue ASA and plavix(long term).  Chronic afib: resume home Toprol now that he has a pacemaker. He has refused OAC in the past.   DMT2: continue SSI  Carotid artery disease: 80-99% RICA stenosis. Followed by Dr. ( possible TCAR candidate).CTA head and neck completed in hospital and showed right ICA occlusion at origin, severe stenosis of proximal left ICA, >80% stenosis.  Bilateral VA origin stenosis, 70% on the right, 50% on the left.  Dense calcification plaque at the basilar tip, new from previous CT, with bilateral stenosis of P1 origins.   Left arm weakness: likely 2/2 significant vascular/nerve damage from subclavian artery approach. Also could be from post op CVA.  PT following. Will require SNF at discharge. Will discuss timing with Care Management  Signed, , PA-C  01/25/2020, 6:10 AM  Pager [phone]  Patient seen, examined. Available data reviewed. Agree with findings, assessment, and plan as outlined by Cline Crock, PA-C. The patient is clinically stable after PPM placement yesterday. He is currently paced at 60 bpm. The patient is awake, alert, and pleasant. He is not oriented to place (states that he is near 01/27/2020). Remaining exam is unchanged with CV - RRR with 2/6 systolic murmur at the left sternal border. Lungs are clear. BL groin sites are clear. There is no edema. Appreciate the care of vascular surgery and stroke teams. Notes  reviewed. Continue current Rx as above.   [id number], M.D. 01/25/2020 4:38 PM

## 2020-01-25 NOTE — Progress Notes (Addendum)
STROKE TEAM PROGRESS NOTE   INTERVAL HISTORY Pt wife and daughter and RN are at the bedside. Pt lying in bed, more awake alert than yesterday. Had pacemaker placed yesterday with Dr. Graciela Husbands. Now HR 70-80s. However, his glucose check was 66. Given orange juice. He still has left UE and LE weakness, no significant change.    OBJECTIVE Vitals:   01/24/20 1953 01/24/20 2331 01/25/20 0531 01/25/20 0838  BP: 117/62 131/63 (!) 150/56 (!) 159/56  Pulse: (!) 59 60 60 (!) 59  Resp: (!) 22 17 14 19   Temp: 98.3 F (36.8 C) 97.8 F (36.6 C) 97.6 F (36.4 C) 97.8 F (36.6 C)  TempSrc: Oral Oral Oral Oral  SpO2: 92% 95% 96% 97%  Weight:   59.7 kg   Height:        CBC:  Recent Labs  Lab 01/22/20 0325 01/23/20 0536  WBC 12.8* 10.9*  HGB 10.0* 10.7*  HCT 31.3* 32.4*  MCV 95.7 94.5  PLT 185 224    Basic Metabolic Panel:  Recent Labs  Lab 01/19/20 0453 01/19/20 0453 01/20/20 0407 01/21/20 0407 01/24/20 1053 01/25/20 0251  NA 134*   < > 135   < > 136 140  K 3.9   < > 4.3   < > 4.4 3.8  CL 101   < > 103   < > 96* 105  CO2 20*   < > 19*   < > 25 25  GLUCOSE 226*   < > 153*   < > 192* 140*  BUN 17   < > 21   < > 36* 31*  CREATININE 1.13   < > 1.22   < > 1.46* 1.17  CALCIUM 8.2*   < > 8.3*   < > 8.3* 7.6*  MG 1.1*  --  1.9  --   --   --    < > = values in this interval not displayed.    Lipid Panel:     Component Value Date/Time   CHOL 141 01/23/2020 0536   TRIG 83 01/23/2020 0536   HDL 51 01/23/2020 0536   CHOLHDL 2.8 01/23/2020 0536   VLDL 17 01/23/2020 0536   LDLCALC 73 01/23/2020 0536   LDLCALC 67 09/21/2019 0922   HgbA1c:  Lab Results  Component Value Date   HGBA1C 6.6 (H) 01/23/2020   Urine Drug Screen: No results found for: LABOPIA, COCAINSCRNUR, LABBENZ, AMPHETMU, THCU, LABBARB  Alcohol Level No results found for: [facility]  IMAGING  MR BRAIN W WO CONTRAST 01/22/2020 IMPRESSION:  1. Numerous small acute infarcts in the right greater than left cerebral hemispheres  and left cerebellum compatible with a central embolic source.  2. 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   CTA Head and Neck  01/23/2020 Right ICA occlusion at the origin, precise age indeterminate. This may well be chronic. Aortic atherosclerosis. Advanced atherosclerotic disease at the left carotid bifurcation and proximal ICA. Severe stenosis of the proximal ICA with luminal diameter estimated at 1 mm. This would be an 80% or greater stenosis. Stenotic disease at both vertebral artery origins. 70% stenosis on the right. 50% stenosis on the left. Dense calcification at the basilar tip consistent with embolized plaque. This was not present on a CT scan 12/28/2019. No acute intracranial large vessel occlusion. The embolized plaque at the basilar tip does result in stenosis of both P1 origins, but those vessels continue to show flow.  Transthoracic Echocardiogram (Limited) 5/12//2021 IMPRESSIONS  1. Left ventricular  ejection fraction, by estimation, is 70 to 75%. The left ventricle has hyperdynamic function. The left ventricle has no regional wall motion abnormalities. There is mild concentric left ventricular hypertrophy. Left ventricular diastolic function could not be evaluated. There is the interventricular septum is flattened in systole, consistent with right ventricular pressure overload.  2. Right ventricular systolic function is mildly reduced. The right ventricular size is moderately enlarged. There is severely elevated pulmonary artery systolic pressure. The estimated right ventricular systolic pressure is 28.0 mmHg.  3. The mitral valve is normal in structure. Mild to moderate mitral valve regurgitation.  4. Tricuspid valve regurgitation is moderate to severe.  5. The aortic valve has been repaired/replaced. Aortic valve regurgitation is not visualized. There is a 26 mm Edwards Sapien prosthetic (TAVR) valve present in the aortic position. Procedure Date: 01/18/2020.  Echo findings are consistent with normal structure and function of the aortic valve prosthesis. Aortic valve mean gradient measures 9.3 mmHg. Aortic valve Vmax measures 2.24 m/s.  6. The inferior vena cava is dilated in size with <50% respiratory variability, suggesting right atrial pressure of 15 mmHg.  Comparison(s): There is marked worsening of pulmonary artery hypertension and severity of tricuspid regurgitation since the intra-procedural study.   ECG - Junctional rhythm with background of atrial fibrillation - ventricular response 50 BPM (See cardiology reading for complete details)  PHYSICAL EXAM  Temp:  [97.6 F (36.4 C)-98.3 F (36.8 C)] 97.8 F (36.6 C) (05/18 0838) Pulse Rate:  [0-204] 59 (05/18 0838) Resp:  [0-38] 19 (05/18 0838) BP: (101-159)/(47-94) 159/56 (05/18 0838) SpO2:  [0 %-100 %] 97 % (05/18 0838) Weight:  [59.7 kg] 59.7 kg (05/18 0531)  General - Well nourished, well developed, lethargic but less so than yesterday.  Ophthalmologic - fundi not visualized due to noncooperation.  Cardiovascular - Regular rhythm and rate.  Neuro - awake alert, mild lethargy but better than yesterday, eyes open, oriented to his name and people, not to place or time. Following simple commands, paucity of speech but no aphasia. Moderate dysarthria. Not cooperative with naming or repeating.  Visual field testing not cooperative, but blinking to visual threat bilaterally.  No gaze palsy, able to have bilateral gaze, PERRL, tracking bilaterally.  Slight left nasolabial fold flattening.  Tongue midline.  Right upper extremity at least 4/5, left upper extremity 2/5 deltoid and 3/5 bicep and 2/5 finger grip.  Right lower extremity 3/5 proximal and distal, left LE 2/5 iliopsoas, 3/5 knee extension and toe DF/PF.  DTR 1+, no Babinski.  Sensation and coordination not cooperative, gait not tested.   ASSESSMENT/PLAN Garrett Hughes is a 79 y.o. male with history of DM 2, CABG (2017), HLD, chronic a  fib,COPD who presented to [facility]R (5/11). Neurology was consulted 5/15 for continued post op AMS and left arm weakness.  He did not receive IV t-PA due to recent surgery and late presentation (>4.5 hours from time of onset).  Stroke: Numerous small acute infarcts in the right greater than left cerebral hemispheres and left cerebellum compatible with a central embolic source. Etiology could be multiple, large vessel occlusion/stenosis in the setting of procedure with low BP vs. TAVR procedure related vs. Chronic afib not on AC  CT head - no acute abnormality  MRI head - Numerous small acute infarcts in the right greater than left cerebral hemispheres and left cerebellum compatible with a central embolic source.   CTA H&N - right ICA occlusion at origin, severe stenosis of proximal left ICA, >  80% stenosis.  Bilateral VA origin stenosis, 70% on the right, 50% on the left.  Dense calcification plaque at the basilar tip, new from previous CT, with bilateral stenosis of P1 origins.  TEE 5/11 EF 50 to 55%, no PFO, no LA or LAA thrombus  2D Echo 5/12 - EF 70 to 75%. No cardiac source of emboli identified.   Sars Corona Virus 2 - negative  LDL - 73  HgbA1c - 6.6  VTE prophylaxis - Lovenox  aspirin 81 mg daily and clopidogrel 75 mg daily prior to admission, now on aspirin 325 mg daily and clopidogrel 75 mg daily.  Patient counseled to be compliant with his antithrombotic medications  Ongoing aggressive stroke risk factor management  Therapy recommendations:  SNF   Disposition:  Pending  Carotid occlusion/stenosis  12/08/2019 followed with vascular surgery, carotid Doppler at that time right ICA 80 to 90%, left ICA 40- 59% stenosis.  This admission CTA head and neck showed right ICA occlusion and left ICA more than 80% stenosis  Repeat carotid Doppler Right ICA occluded and left ICA 80-99% stenosis - progressed from 11/2019  On aspirin 325 and Plavix DAPT  Avoid low BP  Long-term  BP goal 130-160  Continue follow up with Dr. Myra Gianotti as outpt - 02/21/20  Basilar tip calcified plaque  Seen on current CT and CTA head and neck, but not on CT in 12/2019  Concerning for embolic plaque, likely related to recent cardiac procedure  Resulted bilateral P1 stenosis but no flow-limiting  Continue DAPT  Avoid low BP  Bradycardia, CHB post TAVR  HR 30s, but regular rhythm  EP consulted  S/p pacemaker 5/17  Currently HR 70s   Chronic PAF  Not on AC given history of GI bleeding and urinary tract bleeding  Currently on DAPT  Further regimen per cardiology  Hypertension  Home BP meds: Lisinopril ; metoprolol; cardura  Current BP meds: Lisinopril 5, metoprolol 25 XL, Lasix 20 daily  Stable . Long-term BP goal 130-160  Hyperlipidemia  Home Lipid lowering medication: Pravastatin 40 mg daily  LDL 73, goal < 70  Current lipid lowering medication: Pravastatin 40 mg daily  Continue statin at discharge  Intolerant to high dose statins  Diabetes  Home diabetic meds: Glucotrol ; metformin  Current diabetic meds: Lantus  HgbA1c 6.6, goal < 7.0  SSI  CBG monitoring  Hypoglycemia 5/18 with glucose 66 - D5 ordered  PCP follow-up  Other Stroke Risk Factors  Advanced age  Former cigarette smoker - quit  Coronary artery disease status post CABG in 2017  Severe AS s/p TAVR   Other Active Problems  Code status - Full code  Leukocytosis - 12.8->10.7->10.9  Hypokalemia - resolved  AKI - creatinine - 1.26->1.16->1.46->1.17  Hospital day # 7  Neurology will sign off. Please call with questions. Pt will follow up with stroke clinic Dr. Pearlean Brownie at [facility] in about 4 weeks. Thanks for the consult.   Marvel Plan, MD PhD Stroke Neurology 01/25/2020 10:33 AM   To contact Stroke Continuity provider, please refer to [email or web address]. After hours, contact General Neurology

## 2020-01-25 NOTE — Progress Notes (Signed)
Physical Therapy Treatment Patient Details Name: Garrett Hughes MRN: [id number] DOB: 03/06/1941 Today's Date: 01/25/2020    History of Present Illness Pt s/p TAVR for severe aortic stenosis. Post op confusion and left weakness. CT of head negative. MRI on 5/15 showed small acute infarcts in the right greater than left cerebral hemisphere and left cerebellum. On 5/17 pt developed complete heart block and required emergent pacer placement. PMH - CABG, DM, afib, cad, chf, copd, cad, bil rotator cuff repair    PT Comments    Pt making slow progress with balance and mobility.  Continue to recommend SNF.   Follow Up Recommendations  SNF;Supervision/Assistance - 24 hour     Equipment Recommendations  Other (comment)(to be determined)    Recommendations for Other Services       Precautions / Restrictions Precautions Precautions: Fall    Mobility  Bed Mobility Overal bed mobility: Needs Assistance Bed Mobility: Supine to Sit;Sit to Supine     Supine to sit: Max assist;HOB elevated Sit to supine: Total assist;HOB elevated   General bed mobility comments: Pt assisting bring his legs to EOB and to elevate trunk into sitting but still requiring total assist  Transfers Overall transfer level: Needs assistance Equipment used: 1 person hand held assist Transfers: Sit to/from Stand Sit to Stand: Mod assist         General transfer comment: Assist to bring hips up and for balance.   Ambulation/Gait                 Stairs             Wheelchair Mobility    Modified Rankin (Stroke Patients Only)       Balance Overall balance assessment: Needs assistance Sitting-balance support: Feet supported;Bilateral upper extremity supported Sitting balance-Leahy Scale: Poor Sitting balance - Comments: Pt sat EOB x 10 minutes with min to mod assist  Postural control: Right lateral lean Standing balance support: Bilateral upper extremity supported Standing balance-Leahy  Scale: Poor Standing balance comment: Stood x 20 sec with mod assist. Posterior lean and left lean.                             Cognition Arousal/Alertness: Awake/alert Behavior During Therapy: Flat affect Overall Cognitive Status: Impaired/Different from baseline Area of Impairment: Orientation;Following commands;Safety/judgement;Problem solving;Attention;Memory                 Orientation Level: Disoriented to;Place;Time;Situation Current Attention Level: Sustained Memory: Decreased short-term memory Following Commands: Follows one step commands inconsistently;Follows one step commands with increased time Safety/Judgement: Decreased awareness of safety;Decreased awareness of deficits   Problem Solving: Slow processing;Decreased initiation;Difficulty sequencing;Requires verbal cues;Requires tactile cues General Comments: Lt inattention      Exercises      General Comments        Pertinent Vitals/Pain Pain Assessment: No/denies pain    Home Living                      Prior Function            PT Goals (current goals can now be found in the care plan section) Acute Rehab PT Goals Patient Stated Goal: get better Progress towards PT goals: Progressing toward goals    Frequency    Min 3X/week      PT Plan Current plan remains appropriate    Co-evaluation  AM-PAC PT "6 Clicks" Mobility   Outcome Measure  Help needed turning from your back to your side while in a flat bed without using bedrails?: Total Help needed moving from lying on your back to sitting on the side of a flat bed without using bedrails?: Total Help needed moving to and from a bed to a chair (including a wheelchair)?: Total Help needed standing up from a chair using your arms (e.g., wheelchair or bedside chair)?: Total Help needed to walk in hospital room?: Total Help needed climbing 3-5 steps with a railing? : Total 6 Click Score: 6    End of  Session Equipment Utilized During Treatment: Gait belt Activity Tolerance: Patient limited by fatigue Patient left: in bed;with call bell/phone within reach;with bed alarm set Nurse Communication: Mobility status PT Visit Diagnosis: Other abnormalities of gait and mobility (R26.89);Muscle weakness (generalized) (M62.81);Other symptoms and signs involving the nervous system (R29.898)     Time: 3785-8850 PT Time Calculation (min) (ACUTE ONLY): 22 min  Charges:  $Therapeutic Activity: 8-22 mins                     [facility] PT Acute Rehabilitation Services Pager [phone] Office [phone]    Angelina Ok [facility] 01/25/2020, 4:10 PM

## 2020-01-25 NOTE — Progress Notes (Signed)
  Speech Language Pathology Treatment: Cognitive-Linquistic  Patient Details Name: Garrett Hughes MRN: [id number] DOB: [birth year] Today's Date: 01/25/2020 Time: 6073-7106 SLP Time Calculation (min) (ACUTE ONLY): 13 min  Assessment / Plan / Recommendation Clinical Impression  Pt seen for continued ST intervention targeting goals for attention, awareness, memory, and problem solving. Wife and daughter present at bedside, and report pt had a restless night. As a result (in addition to low blood sugar), pt was lethargic today, and difficult to maintain alertness. Pt was accurate for name, DOB. Unable to verbalize orientation to place or time. Session terminated due to lethargy. Will continue per plan of care.   HPI HPI: Pt is a 79 y.o. male with a hx of CABG 1997, chronic atrial fibrillation (pt declines anticoagulation), aortic stenosis, carotid artery disease, HTN, DM, HLD (intolerant of higher dose of pravastatin or atorvastatin), chronic appearing anemia/thrombocytopenia, GERD, Barrett's esophagus, h/o GIB and urinary tract bleeding, who presented to the ED for evaluation of CHF and syncope. MRI brain 5/15: Numerous small acute infarcts in the right greater than left cerebral hemispheres and left cerebellum compatible with a central embolic source. Suspected occlusion of the right ICA.      SLP Plan  Continue with current plan of care       Recommendations   24 hour supervision/SNF                Follow up Recommendations: Skilled Nursing facility SLP Visit Diagnosis: Cognitive communication deficit (Y69.485) Plan: Continue with current plan of care       GO               Cleveland Paiz B. Murvin Natal, [facility], CCC-SLP Speech Language Pathologist Office: [phone] Pager: [phone]  Leigh Aurora 01/25/2020, 11:50 AM

## 2020-01-25 NOTE — Care Management Important Message (Signed)
Important Message  Patient Details  Name: Garrett Hughes MRN: [id number] Date of Birth: 03-Oct-1940   Medicare Important Message Given:  Yes     Renie Ora 01/25/2020, 9:37 AM

## 2020-01-25 NOTE — Consult Note (Signed)
[facility] [place]  Patient name: Garrett Hughes MRN: [id number] DOB: Jan 28, 1941 Sex: male   REQUESTING PROVIDER:    Dr. Cornelius Moras   REASON FOR CONSULT:    Carotid stenosis  HISTORY OF PRESENT ILLNESS:   Garrett Hughes is a 79 y.o. male, who I saw in April 2021 for evaluation of a high-grade right carotid stenosis.  He was asymptomatic.  He was scheduled to have his TAVR performed, I will then plan on likely TCAR.  He underwent TAVR on 01/18/2020.  His postoperative course was complicated by a stroke which is left him with left-sided deficits.  CT angiogram and MR of the brain showed bilateral embolic infarcts most prominent on the right side where the right carotid artery is occluded.  It appears that his left-sided stenosis which was originally in the 40 to 59% range has now progressed to around 80%.  The patient recently had a pacemaker placed for symptomatic bradycardia.  PAST MEDICAL HISTORY    Past Medical History:  Diagnosis Date  . Anemia   . Arthritis   . Atherosclerosis of native coronary artery of native heart without angina pectoris 01/05/2016  . Benign essential hypertension 01/05/2016  . Carotid artery disease (HCC)   . Cataract, nuclear, right 05/06/2014  . Chronic atrial fibrillation (HCC) 12/20/2015  . Chronic diarrhea   . COPD (chronic obstructive pulmonary disease) (HCC)    on nocturnal O2  . Current use of long term anticoagulation 12/20/2015  . Diabetic kidney disease (HCC) 01/05/2016  . GERD (gastroesophageal reflux disease)   . GI bleed   . History of Barrett's esophagus 12/20/2015  . History of blood in urine   . History of kidney stones   . 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 Hyperlipemia 10/26/2015  . Low back pain 05/02/2016  . S/P TAVR (transcatheter aortic valve replacement) 01/18/2020   s/p successful TAVR w/ a 26 mm Edwards S3U via the left subclavian  approach (failed Shockwave TF) with Dr. Excell Seltzer and Dr. Cornelius Moras   . Severe aortic stenosis   . Thrombocytopenia (HCC)   . Type 2 diabetes mellitus with kidney complication, without long-term current use of insulin (HCC) 07/03/2016     FAMILY HISTORY   Family History  Problem Relation Age of Onset  . CAD Father   . Colon cancer Neg Hx   . Heart attack Neg Hx     SOCIAL HISTORY:   Social History   Socioeconomic History  . Marital status: Married    Spouse name: Inocencio Homes  . Number of children: 2  . Years of education: 41  . Highest education level: 11th grade  Occupational History  . Occupation: truck Hospital doctor    Comment: retired  Tobacco Use  . Smoking status: Former Smoker    Quit date: 09/10/1987    Years since quitting: 32.3  . Smokeless tobacco: Never Used  Substance and Sexual Activity  . Alcohol use: No  . Drug use: No  . Sexual activity: Not Currently  Other Topics Concern  . Not on file  Social History Narrative   Married, retired Naval architect   1 son one daughter   2 caffeinated beverages daily   3. Keeps grandsons and granddaughter   Social Determinants of Health   Financial Resource Strain:   . Difficulty of Paying Living Expenses:   Food Insecurity: No Food Insecurity  . Worried About Radiation protection practitioner  of Food in the Last Year: Never true  . Ran Out of Food in the Last Year: Never true  Transportation Needs: No Transportation Needs  . Lack of Transportation (Medical): No  . Lack of Transportation (Non-Medical): No  Physical Activity:   . Days of Exercise per Week:   . Minutes of Exercise per Session:   Stress:   . Feeling of Stress :   Social Connections:   . Frequency of Communication with Friends and Family:   . Frequency of Social Gatherings with Friends and Family:   . Attends Religious Services:   . Active Member of Clubs or Organizations:   . Attends Banker Meetings:   Marland Kitchen Marital Status:   Intimate Partner Violence:   . Fear of Current  or Ex-Partner:   . Emotionally Abused:   Marland Kitchen Physically Abused:   . Sexually Abused:     ALLERGIES:    Allergies  Allergen Reactions  . Atorvastatin     Other reaction(s): Myalgias (intolerance)  . Cholestyramine Other (See Comments)    Constipation  . Pravastatin Other (See Comments)    Does not tolerate high doses    CURRENT MEDICATIONS:    Current Facility-Administered Medications  Medication Dose Route Frequency Provider Last Rate Last Admin  . 0.9 %  sodium chloride infusion  250 mL Intravenous PRN Janetta Hora, PA-C      . 0.9 %  sodium chloride infusion  250 mL Intravenous Continuous Janetta Hora, PA-C   Stopped at 01/20/20 0135  . 0.9 %  sodium chloride infusion   Intravenous Continuous Duke Salvia, MD   Stopped at 01/24/20 1533  . 0.9 %  sodium chloride infusion   Intravenous Continuous Duke Salvia, MD 75 mL/hr at 01/25/20 0400 Rate Verify at 01/25/20 0400  . acetaminophen (TYLENOL) tablet 650 mg  650 mg Oral Q6H PRN Janetta Hora, PA-C   650 mg at 01/24/20 2115   Or  . acetaminophen (TYLENOL) suppository 650 mg  650 mg Rectal Q6H PRN Janetta Hora, PA-C      . aspirin EC tablet 325 mg  325 mg Oral Daily Marvel Plan, MD   325 mg at 01/25/20 0848  . Chlorhexidine Gluconate Cloth 2 % PADS 6 each  6 each Topical Daily Janetta Hora, PA-C   6 each at 01/25/20 0849  . clopidogrel (PLAVIX) tablet 75 mg  75 mg Oral Daily Janetta Hora, PA-C   75 mg at 01/25/20 0847  . ferrous sulfate tablet 325 mg  325 mg Oral Q breakfast Janetta Hora, PA-C   325 mg at 01/25/20 0848  . furosemide (LASIX) tablet 20 mg  20 mg Oral Daily Janetta Hora, PA-C   20 mg at 01/25/20 1114  . insulin aspart (novoLOG) injection 0-24 Units  0-24 Units Subcutaneous TID AC & HS Janetta Hora, PA-C   4 Units at 01/25/20 8657  . insulin aspart (novoLOG) injection 5 Units  5 Units Subcutaneous TID WC Janetta Hora, PA-C   5 Units at 01/25/20  0848  . insulin glargine (LANTUS) injection 5 Units  5 Units Subcutaneous Daily Janetta Hora, PA-C   5 Units at 01/25/20 0848  . lisinopril (ZESTRIL) tablet 5 mg  5 mg Oral Daily Janetta Hora, PA-C   5 mg at 01/25/20 0848  . metoprolol succinate (TOPROL-XL) 24 hr tablet 25 mg  25 mg Oral Daily Janetta Hora, PA-C   25 mg  at 01/25/20 1114  . ondansetron (ZOFRAN) injection 4 mg  4 mg Intravenous Q6H PRN Eileen Stanford, PA-C      . pantoprazole (PROTONIX) EC tablet 20 mg  20 mg Oral Daily Eileen Stanford, PA-C   20 mg at 01/25/20 0847  . pravastatin (PRAVACHOL) tablet 40 mg  40 mg Oral Daily Eileen Stanford, PA-C   40 mg at 01/25/20 0847  . sodium chloride flush (NS) 0.9 % injection 3 mL  3 mL Intravenous Q12H Eileen Stanford, PA-C   3 mL at 01/25/20 0849  . sodium chloride flush (NS) 0.9 % injection 3 mL  3 mL Intravenous PRN Eileen Stanford, PA-C   3 mL at 01/19/20 2206    REVIEW OF SYSTEMS:   X denotes positive finding, denotes negative finding Cardiac  Comments:  Chest pain or chest pressure:    Shortness of breath upon exertion:    Short of breath when lying flat:    Irregular heart rhythm:        Vascular    Pain in calf, thigh, or hip brought on by ambulation:    Pain in feet at night that wakes you up from your sleep:     Blood clot in your veins:    Leg swelling:         Pulmonary    Oxygen at home:    Productive cough:     Wheezing:         Neurologic    Sudden weakness in arms or legs:  x   Sudden numbness in arms or legs:  x   Sudden onset of difficulty speaking or slurred speech:    Temporary loss of vision in one eye:     Problems with dizziness:         Gastrointestinal    Blood in stool:      Vomited blood:         Genitourinary    Burning when urinating:     Blood in urine:        Psychiatric    Major depression:         Hematologic    Bleeding problems:    Problems with blood clotting too easily:        Skin     Rashes or ulcers:        Constitutional    Fever or chills:     PHYSICAL EXAM:   Vitals:   01/24/20 2331 01/25/20 0531 01/25/20 0838 01/25/20 1114  BP: 131/63 (!) 150/56 (!) 159/56 116/83  Pulse: 60 60 (!) 59 60  Resp: 17 14 19    Temp: 97.8 F (36.6 C) 97.6 F (36.4 C) 97.8 F (36.6 C)   TempSrc: Oral Oral Oral   SpO2: 95% 96% 97%   Weight:  59.7 kg    Height:        GENERAL: The patient is a well-nourished male, in no acute distress. The vital signs are documented above. CARDIAC: There is a regular rate and rhythm.  PULMONARY: Nonlabored respirations ABDOMEN: Soft and non-tender with normal pitched bowel sounds.  MUSCULOSKELETAL: There are no major deformities or cyanosis. NEUROLOGIC: Left-sided numbness and weakness.  Not oriented confused SKIN: There are no ulcers or rashes noted.   STUDIES:   CT angiogram neck Right ICA occlusion at the origin, precise age indeterminate. This may well be chronic.  Aortic atherosclerosis.  Advanced atherosclerotic disease at the left carotid bifurcation and proximal ICA. Severe stenosis of  the proximal ICA with luminal diameter estimated at 1 mm. This would be an 80% or greater stenosis.  Stenotic disease at both vertebral artery origins. 70% stenosis on the right. 50% stenosis on the left.  Dense calcification at the basilar tip consistent with embolized plaque. This was not present on a CT scan 12/28/2019.  No acute intracranial large vessel occlusion. The embolized plaque at the basilar tip does result in stenosis of both P1 origins, but those vessels continue to show flow.  MRI brain 1. Numerous small acute infarcts in the right greater than left cerebral hemispheres and left cerebellum compatible with a central embolic source. 2. 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  ASSESSMENT and PLAN   I spoke with the wife and daughter who are at the bedside.  The patient's right carotid is  occluded.  We discussed that there would be no plans for revascularization of his right carotid.  He has had progression of disease on the left.  He will need left carotid intervention either endarterectomy or TCAR once his new baseline neurologic status is established.   Charlena Cross, MD, FACS [facility] [phone] Pager [phone]

## 2020-01-25 NOTE — Progress Notes (Signed)
CBG 66. D50 given. CBG after D50 164. Will continue to monitor.  Ginette Otto, RN

## 2020-01-25 NOTE — TOC Progression Note (Signed)
Transition of Care [facility]) - Progression Note    Patient Details  Name: Garrett Hughes MRN: [id number] Date of Birth: 09-29-1940  Transition [facility] Care [facility]) CM/SW Contact  Eduard Roux, [place] Phone Number: 01/25/2020, 1:34 PM  Clinical Narrative:     CSW spoke with patient's spouse,Garrett Hughes-provided bed offers. She will review and try to provide choice by the end of the day.  CSW will continue to follow.  Antony Blackbird, MSW, LCSWA Clinical Social Worker    Expected Discharge Plan: Skilled Nursing Facility Barriers to Discharge: English as a second language teacher, Continued Medical Work up, SNF Pending bed offer  Expected Discharge Plan and Services Expected Discharge Plan: Skilled Nursing Facility In-house Referral: Clinical Social Work                                             Social Determinants of Health (SDOH) Interventions    Readmission Risk Interventions No flowsheet data found.

## 2020-01-26 LAB — CBC
HCT: 33.4 % — ABNORMAL LOW (ref 39.0–52.0)
Hemoglobin: 10.8 g/dL — ABNORMAL LOW (ref 13.0–17.0)
MCH: 31.2 pg (ref 26.0–34.0)
MCHC: 32.3 g/dL (ref 30.0–36.0)
MCV: 96.5 fL (ref 80.0–100.0)
Platelets: 226 10*3/uL (ref 150–400)
RBC: 3.46 MIL/uL — ABNORMAL LOW (ref 4.22–5.81)
RDW: 14.9 % (ref 11.5–15.5)
WBC: 8.6 10*3/uL (ref 4.0–10.5)
nRBC: 0 % (ref 0.0–0.2)

## 2020-01-26 LAB — BASIC METABOLIC PANEL
Anion gap: 13 (ref 5–15)
BUN: 23 mg/dL (ref 8–23)
CO2: 25 mmol/L (ref 22–32)
Calcium: 8.8 mg/dL — ABNORMAL LOW (ref 8.9–10.3)
Chloride: 103 mmol/L (ref 98–111)
Creatinine, Ser: 1.17 mg/dL (ref 0.61–1.24)
GFR calc Af Amer: >60 mL/min (ref >60)
GFR calc non Af Amer: 59 mL/min — ABNORMAL LOW (ref >60)
Glucose, Bld: 169 mg/dL — ABNORMAL HIGH (ref 70–99)
Potassium: 4 mmol/L (ref 3.5–5.1)
Sodium: 141 mmol/L (ref 135–145)

## 2020-01-26 LAB — GLUCOSE, CAPILLARY
Glucose-Capillary: 146 mg/dL — ABNORMAL HIGH (ref 70–99)
Glucose-Capillary: 151 mg/dL — ABNORMAL HIGH (ref 70–99)
Glucose-Capillary: 209 mg/dL — ABNORMAL HIGH (ref 70–99)
Glucose-Capillary: 126 mg/dL — ABNORMAL HIGH (ref 70–99)

## 2020-01-26 LAB — SARS CORONAVIRUS 2 (TAT 6-24 HRS): SARS Coronavirus 2: NEGATIVE

## 2020-01-26 NOTE — TOC Progression Note (Signed)
Transition of Care [facility]) - Progression Note    Patient Details  Name: HAKIM MINNIEFIELD MRN: [id number] Date of Birth: 1940-10-28  Transition of [facility]) CM/SW Contact  Eduard Roux, [place] Phone Number: 01/26/2020, 8:45 AM  Clinical Narrative:     CSW received voice message from patient's daughter in law, French Ana, they family wants [facility] for short term rehab.   CSW contacted [facility]- they will review and call CSW back.  Antony Blackbird, MSW, LCSWA Clinical Social Worker    Expected Discharge Plan: Skilled Nursing Facility Barriers to Discharge: English as a second language teacher, Continued Medical Work up, SNF Pending bed offer  Expected Discharge Plan and Services Expected Discharge Plan: Skilled Nursing Facility In-house Referral: Clinical Social Work                                             Social Determinants of Health (SDOH) Interventions    Readmission Risk Interventions No flowsheet data found.

## 2020-01-26 NOTE — TOC Progression Note (Signed)
Transition of Care [facility]) - Progression Note    Patient Details  Name: Garrett Hughes MRN: [id number] Date of Birth: Feb 24, 1941  Transition of Care [facility]) CM/SW Contact  Eduard Roux, [place] Phone Number: 01/26/2020, 3:15 PM  Clinical Narrative:     CSW called patient's daughter in law,[phone]- left voice message- insurance is not in network with [facility]- left voice message of bed offers.   CSW spoke with the patient's wife,[place]- provided SNF placement update. Virgibia requested I talked with daughter in law because she was familiar with their plan and understood what was needed. CSW advised, voice message was left with daughter-in law. CSW encourage family to make decision as soon as possible- patient is medically ready for d/c.  Insurance authorization remains pending.  Antony Blackbird, MSW, LCSWA Clinical Social Worker   Expected Discharge Plan: Skilled Nursing Facility Barriers to Discharge: English as a second language teacher, Continued Medical Work up, SNF Pending bed offer  Expected Discharge Plan and Services Expected Discharge Plan: Skilled Nursing Facility In-house Referral: Clinical Social Work                                             Social Determinants of Health (SDOH) Interventions    Readmission Risk Interventions No flowsheet data found.

## 2020-01-26 NOTE — TOC Progression Note (Signed)
Transition of Care [facility]) - Progression Note    Patient Details  Name: MUAAD BOEHNING MRN: [id number] Date of Birth: 24-Nov-1940  Transition of Care [facility]) CM/SW Contact  Eduard Roux, [place] Phone Number: 01/26/2020, 4:19 PM  Clinical Narrative:     [facility] -Patient's insurance remains pending- Reference # [id number]  Antony Blackbird, MSW, LCSWA Clinical Social Worker   Expected Discharge Plan: Skilled Nursing Facility Barriers to Discharge: Insurance Authorization, Continued Medical Work up, SNF Pending bed offer  Expected Discharge Plan and Services Expected Discharge Plan: Skilled Nursing Facility In-house Referral: Clinical Social Work                                             Social Determinants of Health (SDOH) Interventions    Readmission Risk Interventions No flowsheet data found.

## 2020-01-26 NOTE — Progress Notes (Addendum)
[place] VALVE TEAM  Patient Name: Garrett Hughes Date of Encounter: 01/26/2020  Primary Cardiologist: Dr. Stanford Breed / Dr. Burt Knack & Dr. Roxy Manns (TAVR)  Hospital Problem List     Principal Problem:   S/P TAVR (transcatheter aortic valve replacement) Active Problems:   Benign essential hypertension   History of Barrett's esophagus   Hx of CABG   Hyperlipemia   Low back pain   Type 2 diabetes mellitus with kidney complication, without long-term current use of insulin (HCC)   GERD (gastroesophageal reflux disease)   Chronic atrial fibrillation (HCC)   CAD (coronary artery disease)   Acute on chronic diastolic CHF (congestive heart failure) (HCC)   Severe aortic stenosis   Carotid artery disease (HCC)   COPD (chronic obstructive pulmonary disease) ([place])   Cerebral embolism with cerebral infarction     Subjective   Appears comfortable and sleepy. [place] .  Inpatient Medications    Scheduled Meds: . aspirin EC  325 mg Oral Daily  . Chlorhexidine Gluconate Cloth  6 each Topical Daily  . clopidogrel  75 mg Oral Daily  . ferrous sulfate  325 mg Oral Q breakfast  . furosemide  20 mg Oral Daily  . insulin aspart  0-24 Units Subcutaneous TID AC & HS  . insulin aspart  5 Units Subcutaneous TID WC  . insulin glargine  5 Units Subcutaneous Daily  . lisinopril  5 mg Oral Daily  . metoprolol succinate  25 mg Oral Daily  . pantoprazole  20 mg Oral Daily  . pravastatin  40 mg Oral Daily  . sodium chloride flush  3 mL Intravenous Q12H   Continuous Infusions: . sodium chloride    . sodium chloride Stopped (01/20/20 0135)  . sodium chloride Stopped (01/24/20 1533)  . sodium chloride 75 mL/hr at 01/25/20 0400   PRN Meds: sodium chloride, acetaminophen **OR** acetaminophen, ondansetron (ZOFRAN) IV, sodium chloride flush   Vital Signs    Vitals:   01/25/20 1114 01/25/20 1955 01/26/20 0014 01/26/20 0313  BP: 116/83  (!) 156/61 (!) 152/60 (!) 151/63  Pulse: 60 60 60 65  Resp:  13 15 17   Temp:  97.6 F (36.4 C) 97.6 F (36.4 C) 97.8 F (36.6 C)  TempSrc:  Oral Oral Oral  SpO2:  100% 100% 100%  Weight:    58.3 kg  Height:        Intake/Output Summary (Last 24 hours) at 01/26/2020 0737 Last data filed at 01/25/2020 1026 Gross per 24 hour  Intake 120 ml  Output --  Net 120 ml   Filed Weights   01/24/20 0412 01/25/20 0531 01/26/20 0313  Weight: 59.7 kg 59.7 kg 58.3 kg    Physical Exam   GEN: chronically ill appearing, lethargic, confused in mittens HEENT: Grossly normal.  Neck: Supple, no JVD, carotid bruits, or masses. Cardiac: 2/6 SEM @ RUSB. No rubs, or gallops. No clubbing, cyanosis, edema.   Respiratory:  Respirations regular and unlabored, clear to auscultation bilaterally. GI: Soft, nontender, nondistended, BS + x 4. MS: no deformity or atrophy. Skin: warm and dry, no rash. Neuro:  Oriented to self only. Left arm with significant weakness although he is able to move it when asked Psych: AAOx3.  Normal affect.  Labs    CBC No results for input(s): WBC, NEUTROABS, HGB, HCT, MCV, PLT in the last 72 hours. Basic Metabolic Panel Recent Labs    01/24/20 1053 01/25/20 0251  NA 136 140  K 4.4 3.8  CL 96* 105  CO2 25 25  GLUCOSE 192* 140*  BUN 36* 31*  CREATININE 1.46* 1.17  CALCIUM 8.3* 7.6*   Liver Function Tests No results for input(s): AST, ALT, ALKPHOS, BILITOT, PROT, ALBUMIN in the last 72 hours. No results for input(s): LIPASE, AMYLASE in the last 72 hours. Cardiac Enzymes No results for input(s): CKTOTAL, CKMB, CKMBINDEX, TROPONINI in the last 72 hours. BNP Invalid input(s): POCBNP D-Dimer No results for input(s): DDIMER in the last 72 hours. Hemoglobin A1C No results for input(s): HGBA1C in the last 72 hours. Fasting Lipid Panel No results for input(s): CHOL, HDL, LDLCALC, TRIG, CHOLHDL, LDLDIRECT in the last 72 hours. Thyroid Function Tests No results for  input(s): TSH, T4TOTAL, T3FREE, THYROIDAB in the last 72 hours.  Invalid input(s): FREET3  Telemetry    paced  - Personally Reviewed  ECG    V paced  - Personally Reviewed  Radiology    DG Chest 2 View  Result Date: 01/25/2020 CLINICAL DATA:  Cardiac arrhythmia with pacemaker placement EXAM: CHEST - 2 VIEW COMPARISON:  Jan 21, 2020. FINDINGS: There is now a pacemaker on the right with lead tip attached to the right ventricle. No pneumothorax. There is bibasilar atelectasis. Lungs elsewhere are clear. Heart is slightly enlarged with pulmonary vascularity within normal limits. Patient is status post coronary artery bypass grafting and aortic valve replacement. There is aortic atherosclerosis. Bones appear osteoporotic. IMPRESSION: Pacemaker lead tip attached to right ventricle. No pneumothorax. Bibasilar atelectasis. Stable cardiac prominence with postoperative changes. Aortic Atherosclerosis (ICD10-I70.0). Electronically Signed   By: Bretta Bang III M.D.   On: 01/25/2020 08:34    Cardiac Studies   TAVR OPERATIVE NOTE   Date of Procedure:01/18/2020  Preoperative Diagnosis:Severe Aortic Stenosis   Postoperative Diagnosis:Same   Procedure:   Transcatheter Aortic Valve Replacement - Left Trans-SubclavianApproach Edwards Sapien 3 UltraTHV (size 79mm, model # [id number], serial # [id number])  Co-Surgeons:Clarence H. Cornelius Moras, MD and [name], MD  Anesthesiologist:David Noreene Larsson, MD  Echocardiographer:Mihai Croitoru, MD  Pre-operative Echo Findings: ? Severe aortic stenosis ? Normalleft ventricular systolic function  Post-operative Echo Findings: ? Noparavalvular leak ? Normalleft ventricular systolic function  _______________   Echo5/12/21: IMPRESSIONS  1. Left ventricular ejection fraction, by estimation, is 70 to 75%. The left ventricle  has hyperdynamic function. The left ventricle has no regional wall motion abnormalities. There is mild concentric left  ventricular hypertrophy. Left ventricular diastolic function could not be evaluated. There is the interventricular septum is flattened in systole, consistent with right ventricular pressure overload.  2. Right ventricular systolic function is mildly reduced. The right  ventricular size is moderately enlarged. There is severely elevated  pulmonary artery systolic pressure. The estimated right ventricular  systolic pressure is 85.2 mmHg.  3. The mitral valve is normal in structure. Mild to moderate mitral valve regurgitation.  4. Tricuspid valve regurgitation is moderate to severe.  5. The aortic valve has been repaired/replaced. Aortic valve  regurgitation is not visualized. There is a 26 mm Edwards Sapien  prosthetic (TAVR) valve present in the aortic position. Procedure Date:  01/18/2020. Echo findings are consistent with normal  structure and function of the aortic valve prosthesis. Aortic valve mean  gradient measures 9.3 mmHg. Aortic valve Vmax measures 2.24 m/s.  6. The inferior vena cava is dilated in size with <50% respiratory  variability, suggesting right atrial pressure of 15 mmHg.   Comparison(s): There is marked worsening of pulmonary artery hypertension and  severity of tricuspid regurgitation since the intra-procedural study.   Patient Profile     PHILIP KOTLYAR is a 79 y.o. male with a history of CAD s/p CABG 1997 and recent PCI of RCA (12/24/19), RBBB, chronic atrial fibrillation (pt declines anticoagulation),high grade carotid artery disease,HTN, DMT2, HLD, chronic appearing anemia/thrombocytopenia, GERD, Barrett's esophagus, h/o GIB and urinary tract bleeding and severe AS who presented to [facility] on 01/18/20 for planned TAVR.  Assessment & Plan   Severe AS:s/p successful TAVR with a75mm Edwards Sapien 3 THV via the left subclavianapproach on 01/18/20.  Operative course complicated by balloon rupture during valve deployment and axillary artery damage, this was successfully repaired and there was no significant vascular injury by flouroscopy.Post operative echo showed EF 70%, normally functioning TAVR with a mean gradient of 9.3 mm Hg and no PVL. There was mod-severe TR and pulm HTN. Groin site/subclavian siteare stable. He then developed profound bradycardia with a junctional rhythm and required PPM on 5/17. Continue Asprin 325mg  daily andplavix 75 mg daily.  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 01/26/20.  Acute CVA: pt developed AMS post operatively. Stat head CT was negative. Follow up MRI showed numerous small acute infarcts in the right greater than left cerebral hemispheres and left cerebellum compatible with a central embolic source. CTA and carotid dopplers also showed total occlusion on RICA. Etiology could be multiple, large vessel occlusion/stenosis in the setting of procedure/low BP vs. TAVR procedure related vs. chronic afib not on AC. Neurology has recommended asa 325 mg daily and plavix 75mg  daily.   HTN: BP mildly elevated. Resume home meds.  Acute on chronic anemia: he was transfused 1U PRBCS. Hg stable at 10.7. No recent labs. Will order CBC  Acute on chronic diastolic CHF: patient presented with progressive dyspnea and New York Heart Association functional class III symptoms, elevated BNP, and increasing diuretic requirement. His postoperative day #1 echo demonstrates evidence of pulmonary hypertension and RV volume overload. There as interstitial edema noted on x-ray. Restarted on home lasix 20mg  daily. Order BMET for today.   CAD:s/p complex atherectomy and PCIoncomplicated by dissection/contained perforation treated with overlapping DES and a Papyrus covered stenton 12/24/19. No recurrent angina. Continue ASA and plavix(long term).  Chronic afib: underlying afib. Has previously refused oral  anticoagulation and has a history of GI/GU bleeding.   DMT2: continue SSI  Carotid artery disease: 80-99% RICA stenosis. Followed by Dr. ( possible TCAR candidate).CTA head and neck completed in hospital and showed right ICA occlusion at origin, severe stenosis of proximal left ICA, >80% stenosis.  Bilateral VA origin stenosis, 70% on the right, 50% on the left.  Dense calcification plaque at the basilar tip, new from previous CT, with bilateral stenosis of P1 origins. Dr. saw him yesterday and recommends no revascularization for RICA but he will need left carotid intervention either endarterectomy or TCAR once his new baseline neurologic status is established.  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  Dispo: he will require SNF placement. Care management working on placement.  Signed4/18/21, PA-C  01/26/2020, 7:37 AM  Pager [phone]  Patient seen, examined. Available data reviewed. Agree with findings, assessment, and plan as outlined by Cline Crock, PA-C.  The patient is independently interviewed and examined.  He answers questions appropriately, but remains confused.  He is chewing on his mitten.  Heart is regular rate and rhythm with a 3/6 systolic murmur at  the left lower sternal border and apex.  Lungs are clear.  Bilateral groin sites are clear.  There is no peripheral edema.  The patient moves all extremities to command.  Patient is medically stable regarding his multiple problems outlined above.  Unfortunately he is making minimal progress with his confusion.  He remains pleasant.  Awaiting skilled nursing placement.  Appreciate neurology and vascular surgery consultations.  Will consider referral back to vascular surgery as an outpatient for consideration of contralateral carotid treatment pending further recovery.   [name], M.D. 01/26/2020 10:05 AM

## 2020-01-26 NOTE — Progress Notes (Signed)
  Progress Note    01/26/2020 8:26 AM 2 Days Post-Op  Subjective:  No complaints. Just states " I have had a lot going on with my health recently, I just hope I am as healthy as I feel". Denies any pain   Vitals:   01/26/20 0014 01/26/20 0313  BP: (!) 152/60 (!) 151/63  Pulse: 60 65  Resp: 15 17  Temp: 97.6 F (36.4 C) 97.8 F (36.6 C)  SpO2: 100% 100%   Physical Exam: General: elderly, well developed and well nourished, appears comfortable Cardiac:  Dressing clean, dry and intact Lungs:  Non labored Extremities:  Moving all extremities. Bilateral upper and lower extremities are well perfused Abdomen:  Soft, non tender, non distended Neurologic: somewhat confused, alert, speech is coherent. Tongue midline. Face symmetrical. Right grip strength 3/5 left 5/5. CN intact  CBC    Component Value Date/Time   WBC 8.6 01/26/2020 0813   RBC 3.46 (L) 01/26/2020 0813   HGB 10.8 (L) 01/26/2020 0813   HGB 10.9 (L) 12/21/2019 0918   HCT 33.4 (L) 01/26/2020 0813   HCT 33.0 (L) 12/21/2019 0918   PLT 226 01/26/2020 0813   PLT 144 (L) 12/21/2019 0918   MCV 96.5 01/26/2020 0813   MCV 99 (H) 12/21/2019 0918   MCH 31.2 01/26/2020 0813   MCHC 32.3 01/26/2020 0813   RDW 14.9 01/26/2020 0813   RDW 14.1 12/21/2019 0918   LYMPHSABS 0.4 (L) 12/28/2019 0222   LYMPHSABS 0.8 12/21/2019 0918   MONOABS 1.0 12/28/2019 0222   EOSABS 0.0 12/28/2019 0222   EOSABS 0.0 12/21/2019 0918   BASOSABS 0.0 12/28/2019 0222   BASOSABS 0.0 12/21/2019 0918    BMET    Component Value Date/Time   NA 140 01/25/2020 0251   NA 136 12/21/2019 0918   K 3.8 01/25/2020 0251   CL 105 01/25/2020 0251   CO2 25 01/25/2020 0251   GLUCOSE 140 (H) 01/25/2020 0251   BUN 31 (H) 01/25/2020 0251   BUN 24 12/21/2019 0918   CREATININE 1.17 01/25/2020 0251   CREATININE 1.25 (H) 11/23/2019 0811   CALCIUM 7.6 (L) 01/25/2020 0251   GFRNONAA 59 (L) 01/25/2020 0251   GFRNONAA 65 09/21/2019 0922   GFRAA >60 01/25/2020 0251    GFRAA 76 09/21/2019 0922    INR    Component Value Date/Time   INR 1.1 01/13/2020 0842     Intake/Output Summary (Last 24 hours) at 01/26/2020 0826 Last data filed at 01/25/2020 1026 Gross per 24 hour  Intake 120 ml  Output --  Net 120 ml     Assessment/Plan:  79 y.o. male is s/p TAVR on 01/18/20 complicated by post op stroke with left sided deficits. He has right carotid artery occlusion and high grade left ICA stenosis now around 80%. Had emergent pacemaker placed also for complete heart block that occurred post operatively. He will need left carotid intervention- CEA vs TCAR. He is on Aspirin, Plavix, and statin. He has had no new neurological deficits. He does still seem somewhat confused this morning. He has no family at bedside this morning. Dr. Myra Gianotti will determine timing of his intervention once patients baseline neurological status is established   Graceann Congress, [facility] [phone] 01/26/2020 8:26 AM

## 2020-01-26 NOTE — Progress Notes (Signed)
Occupational Therapy Treatment Patient Details Name: Garrett Hughes MRN: [id number] DOB: Jun 30, 1941 Today's Date: 01/26/2020    History of present illness Pt s/p TAVR for severe aortic stenosis. Post op confusion and left weakness. CT of head negative. MRI on 5/15 showed small acute infarcts in the right greater than left cerebral hemisphere and left cerebellum. On 5/17 pt developed complete heart block and required emergent pacer placement. PMH - CABG, DM, afib, cad, chf, copd, cad, bil rotator cuff repair   OT comments  VSS. Pt with L inattention/L sided weakness. Pt seated at EOB x10 mins with minA overall for sitting upright; For light ROM, grooming and sit to stand tasks. Pt requiring assist for mobility as RUE in sling. OTR attempting to initiate L visual field inattention and increasing awareness of L side, but pt following few commands. Pt would benefit from continued OT skilled services. OT following acutely.   Follow Up Recommendations  SNF;Supervision/Assistance - 24 hour    Equipment Recommendations  Other (comment)(defer)    Recommendations for Other Services      Precautions / Restrictions Precautions Precautions: Fall Restrictions Weight Bearing Restrictions: No       Mobility Bed Mobility Overal bed mobility: Needs Assistance Bed Mobility: Supine to Sit;Sit to Supine     Supine to sit: Max assist;HOB elevated Sit to supine: Total assist;HOB elevated   General bed mobility comments: Pt following ~10% of commands at this time.  Transfers Overall transfer level: Needs assistance Equipment used: 1 person hand held assist Transfers: Sit to/from Stand Sit to Stand: Mod assist         General transfer comment: assist for power up and pt unable to rock forward to assist    Balance Overall balance assessment: Needs assistance Sitting-balance support: Feet supported;Bilateral upper extremity supported Sitting balance-Leahy Scale: Poor Sitting balance -  Comments: Pt sitting EOB x10 mins with minA initially to minguardA after pt regained balance after positioning. Postural control: Right lateral lean Standing balance support: Single extremity supported Standing balance-Leahy Scale: Poor Standing balance comment: Pt with posterior lean onto bed; stood x2 times for 30 secs each; knees blocked to promote extension.                           ADL either performed or assessed with clinical judgement   ADL Overall ADL's : Needs assistance/impaired Eating/Feeding: Total assistance Eating/Feeding Details (indicate cue type and reason): OTR attempting to hand utensil with food to pt, but pt continued to open mouth in prep for food coming close. Grooming: Set up;Sitting Grooming Details (indicate cue type and reason): using RUE to wash face                 Toilet Transfer: Maximal assistance;Stand-pivot Toilet Transfer Details (indicate cue type and reason): Pt able to side step to [facility] Toileting- Clothing Manipulation and Hygiene: Total assistance;Bed level       Functional mobility during ADLs: Maximal assistance General ADL Comments: Pt seated at EOB For light ROM, grooming and sit to stand tasks. Pt requiring assist for mobility as RUE in sling.     Vision   Vision Assessment?: Yes Eye Alignment: Impaired (comment) Ocular Range of Motion: Restricted on the left Alignment/Gaze Preference: Gaze right Tracking/Visual Pursuits: Left eye does not track laterally;Right eye does not track laterally Additional Comments: Pt not following commands well so pt requires cues to turn head to L and pt unsuccessful without tactile cues  Perception     Praxis      Cognition Arousal/Alertness: Awake/alert Behavior During Therapy: Flat affect Overall Cognitive Status: Impaired/Different from baseline Area of Impairment: Orientation;Following commands;Safety/judgement;Problem solving;Attention;Memory                  Orientation Level: Disoriented to;Place;Time;Situation Current Attention Level: Sustained Memory: Decreased short-term memory Following Commands: Follows one step commands inconsistently;Follows one step commands with increased time Safety/Judgement: Decreased awareness of safety;Decreased awareness of deficits   Problem Solving: Slow processing;Decreased initiation;Difficulty sequencing;Requires verbal cues;Requires tactile cues General Comments: Lt inattention        Exercises Exercises: Other exercises Other Exercises Other Exercises: elbow, wrist and hand exercises x10 reps.   Shoulder Instructions       General Comments VSS. Pt with L inattention/L sided weakness.    Pertinent Vitals/ Pain       Pain Assessment: No/denies pain  Home Living                                          Prior Functioning/Environment              Frequency  Min 2X/week        Progress Toward Goals  OT Goals(current goals can now be found in the care plan section)  Progress towards OT goals: Progressing toward goals  Acute Rehab OT Goals Patient Stated Goal: get better OT Goal Formulation: With patient/family Time For Goal Achievement: 02/05/20 Potential to Achieve Goals: Good ADL Goals Pt Will Perform Grooming: with mod assist;sitting Pt Will Perform Upper Body Bathing: with max assist;with mod assist;sitting Pt Will Perform Upper Body Dressing: with max assist;with mod assist;sitting Pt Will Transfer to Toilet: with max assist;stand pivot transfer;bedside commode Additional ADL Goal #1: pt will complete bed mobility max A to sit EOB with max/mod A for balance support for simple grooming and UB ADL tasks  Plan Discharge plan remains appropriate    Co-evaluation                 AM-PAC OT "6 Clicks" Daily Activity     Outcome Measure   Help from another person eating meals?: A Lot Help from another person taking care of personal grooming?: A  Lot Help from another person toileting, which includes using toliet, bedpan, or urinal?: Total Help from another person bathing (including washing, rinsing, drying)?: Total Help from another person to put on and taking off regular upper body clothing?: Total Help from another person to put on and taking off regular lower body clothing?: Total 6 Click Score: 8    End of Session Equipment Utilized During Treatment: Gait belt  OT Visit Diagnosis: Other abnormalities of gait and mobility (R26.89);Other symptoms and signs involving cognitive function;Muscle weakness (generalized) (M62.81) Pain - Right/Left: Left Pain - part of body: Shoulder   Activity Tolerance Patient tolerated treatment well;Treatment limited secondary to medical complications (Comment)   Patient Left in bed;with call bell/phone within reach;with bed alarm set   Nurse Communication Mobility status        Time: 6283-1517 OT Time Calculation (min): 33 min  Charges: OT General Charges $OT Visit: 1 Visit OT Treatments $Self Care/Home Management : 8-22 mins $Neuromuscular Re-education: 8-22 mins  Jefferey Pica, OTR/L Acute Rehabilitation Services Pager: [phone] Office: [phone]    Garrett Hughes 01/26/2020, 2:51 PM

## 2020-01-27 ENCOUNTER — Ambulatory Visit: Payer: Medicare Other | Admitting: Physician Assistant

## 2020-01-27 LAB — GLUCOSE, CAPILLARY
Glucose-Capillary: 195 mg/dL — ABNORMAL HIGH (ref 70–99)
Glucose-Capillary: 181 mg/dL — ABNORMAL HIGH (ref 70–99)
Glucose-Capillary: 127 mg/dL — ABNORMAL HIGH (ref 70–99)
Glucose-Capillary: 115 mg/dL — ABNORMAL HIGH (ref 70–99)

## 2020-01-27 NOTE — Social Work (Signed)
Spoke to pt's wife [place] and dtr-in-law [place] [phone] re SNF choice. Dtr-in-law was on a tour of [facility] at time of call and reports she has another SNF tour this afternoon. Dtr-in-law is advising pt's wife on SNF choice and hopes to have a decision by the end of day today. SW will provide updates as available.   Dellie Burns, MSW, LCSW [phone] (coverage)

## 2020-01-27 NOTE — Plan of Care (Signed)
Continue to monitor

## 2020-01-27 NOTE — Progress Notes (Addendum)
[facility]   MULTIDISCIPLINARY HEART VALVE TEAM  Patient Name: Garrett Hughes Date of Encounter: 01/27/2020  Primary Cardiologist: Dr. Jens Som / Dr. Excell Seltzer & Dr. Cornelius Moras (TAVR)  Hospital Problem List     Principal Problem:   S/P TAVR (transcatheter aortic valve replacement) Active Problems:   Benign essential hypertension   History of Barrett's esophagus   Hx of CABG   Hyperlipemia   Low back pain   Type 2 diabetes mellitus with kidney complication, without long-term current use of insulin (HCC)   GERD (gastroesophageal reflux disease)   Chronic atrial fibrillation (HCC)   CAD (coronary artery disease)   Acute on chronic diastolic CHF (congestive heart failure) (HCC)   Severe aortic stenosis   Carotid artery disease (HCC)   COPD (chronic obstructive pulmonary disease) (HCC)   Cerebral embolism with cerebral infarction     Subjective   Sitting up in chair. Wife present. Pt oriented to self but thinks he is in [place]  Inpatient Medications    Scheduled Meds: . aspirin EC  325 mg Oral Daily  . Chlorhexidine Gluconate Cloth  6 each Topical Daily  . clopidogrel  75 mg Oral Daily  . ferrous sulfate  325 mg Oral Q breakfast  . furosemide  20 mg Oral Daily  . insulin aspart  0-24 Units Subcutaneous TID AC & HS  . insulin aspart  5 Units Subcutaneous TID WC  . insulin glargine  5 Units Subcutaneous Daily  . lisinopril  5 mg Oral Daily  . metoprolol succinate  25 mg Oral Daily  . pantoprazole  20 mg Oral Daily  . pravastatin  40 mg Oral Daily  . sodium chloride flush  3 mL Intravenous Q12H   Continuous Infusions: . sodium chloride    . sodium chloride Stopped (01/20/20 0135)  . sodium chloride Stopped (01/24/20 1533)  . sodium chloride Stopped (01/25/20 0706)   PRN Meds: sodium chloride, acetaminophen **OR** acetaminophen, ondansetron (ZOFRAN) IV, sodium chloride flush   Vital Signs    Vitals:   01/27/20 0500 01/27/20 0646 01/27/20 0800 01/27/20  0956  BP:  (!) 163/70 (!) 151/79 (!) 157/111  Pulse:  (!) 59 (!) 59 60  Resp:  20 18 15   Temp:  (!) 97.3 F (36.3 C) 98.3 F (36.8 C)   TempSrc:  Axillary Axillary   SpO2:  100% 98%   Weight: 57.6 kg     Height:        Intake/Output Summary (Last 24 hours) at 01/27/2020 1217 Last data filed at 01/27/2020 1003 Gross per 24 hour  Intake 838.85 ml  Output 480 ml  Net 358.85 ml   Filed Weights   01/25/20 0531 01/26/20 0313 01/27/20 0500  Weight: 59.7 kg 58.3 kg 57.6 kg    Physical Exam   GEN: chronically ill appearing, sitting up in chair, seems more alert today.  HEENT: Grossly normal.  Neck: Supple, no JVD, carotid bruits, or masses. Cardiac: 2/6 SEM @ RUSB. No rubs, or gallops. No clubbing, cyanosis, edema.   Respiratory:  Respirations regular and unlabored, clear to auscultation bilaterally. GI: Soft, nontender, nondistended, BS + x 4. MS: no deformity or atrophy. Skin: warm and dry, no rash. Neuro:  Oriented to self only. Left arm with weakness, although slightly improved Psych: AAOx3.  Normal affect.  Labs    CBC Recent Labs    01/26/20 0813  WBC 8.6  HGB 10.8*  HCT 33.4*  MCV 96.5  PLT 226   Basic Metabolic  Panel Recent Labs    01/25/20 0251 01/26/20 0813  NA 140 141  K 3.8 4.0  CL 105 103  CO2 25 25  GLUCOSE 140* 169*  BUN 31* 23  CREATININE 1.17 1.17  CALCIUM 7.6* 8.8*   Liver Function Tests No results for input(s): AST, ALT, ALKPHOS, BILITOT, PROT, ALBUMIN in the last 72 hours. No results for input(s): LIPASE, AMYLASE in the last 72 hours. Cardiac Enzymes No results for input(s): CKTOTAL, CKMB, CKMBINDEX, TROPONINI in the last 72 hours. BNP Invalid input(s): POCBNP D-Dimer No results for input(s): DDIMER in the last 72 hours. Hemoglobin A1C No results for input(s): HGBA1C in the last 72 hours. Fasting Lipid Panel No results for input(s): CHOL, HDL, LDLCALC, TRIG, CHOLHDL, LDLDIRECT in the last 72 hours. Thyroid Function Tests No  results for input(s): TSH, T4TOTAL, T3FREE, THYROIDAB in the last 72 hours.  Invalid input(s): FREET3  Telemetry    paced  - Personally Reviewed  ECG    V paced  - Personally Reviewed  Radiology    No results found.  Cardiac Studies   TAVR OPERATIVE NOTE   Date of Procedure:01/18/2020  Preoperative Diagnosis:Severe Aortic Stenosis   Postoperative Diagnosis:Same   Procedure:   Transcatheter Aortic Valve Replacement - Left Trans-SubclavianApproach Edwards Sapien 3 UltraTHV (size 66mm, model # [id number], serial # [id number])  Co-Surgeons:Clarence H. Cornelius Moras, MD and [name], MD  Anesthesiologist:David Noreene Larsson, MD  Echocardiographer:Mihai Croitoru, MD  Pre-operative Echo Findings: ? Severe aortic stenosis ? Normalleft ventricular systolic function  Post-operative Echo Findings: ? Noparavalvular leak ? Normalleft ventricular systolic function  _______________   Echo5/12/21: IMPRESSIONS  1. Left ventricular ejection fraction, by estimation, is 70 to 75%. The left ventricle has hyperdynamic function. The left ventricle has no regional wall motion abnormalities. There is mild concentric left  ventricular hypertrophy. Left ventricular diastolic function could not be evaluated. There is the interventricular septum is flattened in systole, consistent with right ventricular pressure overload.  2. Right ventricular systolic function is mildly reduced. The right  ventricular size is moderately enlarged. There is severely elevated  pulmonary artery systolic pressure. The estimated right ventricular  systolic pressure is 85.2 mmHg.  3. The mitral valve is normal in structure. Mild to moderate mitral valve regurgitation.  4. Tricuspid valve regurgitation is moderate to severe.  5. The aortic valve has been repaired/replaced. Aortic valve   regurgitation is not visualized. There is a 26 mm Edwards Sapien  prosthetic (TAVR) valve present in the aortic position. Procedure Date:  01/18/2020. Echo findings are consistent with normal  structure and function of the aortic valve prosthesis. Aortic valve mean  gradient measures 9.3 mmHg. Aortic valve Vmax measures 2.24 m/s.  6. The inferior vena cava is dilated in size with <50% respiratory  variability, suggesting right atrial pressure of 15 mmHg.   Comparison(s): There is marked worsening of pulmonary artery hypertension and severity of tricuspid regurgitation since the intra-procedural study.   Patient Profile     Garrett Hughes is a 79 y.o. male with a history of CAD s/p CABG 1997 and recent PCI of RCA (12/24/19), RBBB, chronic atrial fibrillation (pt declines anticoagulation),high grade carotid artery disease,HTN, DMT2, HLD, chronic appearing anemia/thrombocytopenia, GERD, Barrett's esophagus, h/o GIB and urinary tract bleeding and severe AS who presented to [facility] on 01/18/20 for planned TAVR.  Assessment & Plan   Severe AS:s/p successful TAVR with a108mm Edwards Sapien 3 THV via the left subclavianapproach on 01/18/20. Operative course complicated by balloon rupture during valve deployment  and axillary artery damage, this was successfully repaired and there was no significant vascular injury by flouroscopy.Post operative echo showed EF 70%, normally functioning TAVR with a mean gradient of 9.3 mm Hg and no PVL. There was mod-severe TR and pulm HTN. Groin site/subclavian siteare stable. He then developed profound bradycardia with a junctional rhythm and required PPM on 5/17. Continue Asprin 325mg  daily andplavix 75 mg daily.  Emmet with a junctional escape: s/p St Jude PPM on 01/24/20 by Dr. Caryl Comes.  Acute CVA: pt developed AMS post operatively. Stat head CT was negative. Follow up MRI showed numerous small acute infarcts in the right greater than left cerebral hemispheres and left  cerebellum compatible with a central embolic source. CTA and carotid dopplers also showed total occlusion on RICA. Etiology could be multiple, large vessel occlusion/stenosis in the setting of procedure/low BP vs. TAVR procedure related vs. chronic afib not on AC. Neurology has recommended asa 325 mg daily and plavix 75mg  daily.   HTN: BP remains elevated. Resumed on home meds. Continue to follow  Acute on chronic anemia: he was transfused 1U PRBCS. Hg stable at 10.8  Acute on chronic diastolic CHF: patient presented with progressive dyspnea and New York Heart Association functional class III symptoms, elevated BNP, and increasing diuretic requirement. His postoperative day #1 echo demonstrates evidence of pulmonary hypertension and RV volume overload. There as interstitial edema noted on x-ray. Restarted on home lasix 20mg  daily. Creat has remained stable.   CAD:s/p complex atherectomy and PCIoncomplicated by dissection/contained perforation treated with overlapping DES and a Papyrus covered stenton 12/24/19. No recurrent angina. Continue ASA and plavix(long term).  Chronic afib: underlying afib. Has previously refused oral anticoagulation and has a history of GI/GU bleeding.   DMT2: continue SSI  Carotid artery disease: 80-99% RICA stenosis. Followed by Dr. Trula Slade ( possible TCAR candidate).CTA head and neck completed in hospital and showed right ICA occlusion at origin, severe stenosis of proximal left ICA, >80% stenosis.  Bilateral VA origin stenosis, 70% on the right, 50% on the left.  Dense calcification plaque at the basilar tip, new from previous CT, with bilateral stenosis of P1 origins. Dr. Trula Slade saw him yesterday and recommends no revascularization for RICA but he will need left carotid intervention either endarterectomy or TCAR once his new baseline neurologic status is established.  Left arm weakness: likely multifactorial 2/2 brachial artery plexus injury and acute  CVA.  Dispo: waiting on SNF placement.   Mable Fill, PA-C  01/27/2020, 12:17 PM  Pager [phone]  Patient seen, examined 5/20 at 6:30pm. Available data reviewed. Agree with findings, assessment, and plan as outlined by Nell Range, PA-C. Exam unchanged. Remains pleasantly confused. Moving all extremities to command but requires more prompting to move the left side. Awaiting SNF placement. Medically stable as outlined above.  Sherren Mocha, M.D. 01/28/2020 7:18 AM

## 2020-01-27 NOTE — Progress Notes (Signed)
Plan of care reviewed. Appeared comfortable and calm. Confused, oriented to self, able to follow simple commands. Neurological status, no significant changed from previous shift.   Pt's hemodynamically stable. Remained afebrile. V- paced on monitor, HR 60, BP with in normal limits. SPO2 97-100% on room air. Surgical wound intact, dry and clean, no drainage, no hematoma. Pain tolerated well. No immediate distress. Will continue to monitor.   Filiberto Pinks, RN

## 2020-01-27 NOTE — Telephone Encounter (Signed)
-----   Message from Purcell Nails, MD sent at 01/24/2020  5:11 PM EDT ----- Regarding: RE: Update on his Father I spoke with him.  He's fine ----- Message ----- From: [name], MD Sent: 01/24/2020   4:13 PM EDT To: Purcell Nails, MD, [name], MD, # Subject: RE: Update on his Father                       I just called and left a message on his voicemail. Will try again tomorrow ----- Message ----- From: Allena Katz Sent: 01/24/2020  10:53 AM EDT To: Purcell Nails, MD, [name], MD, # Subject: RE: Update on his Father                       I called the wife and got the number for the son and added to the chart [phone] Loreli Dollar)  KT ----- Message ----- From: Purcell Nails, MD Sent: 01/24/2020  10:38 AM EDT To: [name], MD, Janetta Hora, PA-C, # Subject: RE: Update on his Father                       I have spoken with his wife daily over the weekend.  She was not at the bedside this morning. ----- Message ----- From: Steve Rattler, RN Sent: 01/24/2020   9:50 AM EDT To: Purcell Nails, MD Subject: Update on his Father                           Hey,  Patient's son is requesting you call him when you can.  He is wanting an update on his father.  Nursing staff is unable to answer the questions, per family.  States that his mom has been "left in the dark" on his care and a plan.  Thanks,  Morrie Sheldon

## 2020-01-27 NOTE — Progress Notes (Signed)
Physical Therapy Treatment Patient Details Name: Garrett Hughes MRN: [id number] DOB: Jan 15, 1941 Today's Date: 01/27/2020    History of Present Illness Pt s/p TAVR for severe aortic stenosis. Post op confusion and left weakness. CT of head negative. MRI on 5/15 showed small acute infarcts in the right greater than left cerebral hemisphere and left cerebellum. On 5/17 pt developed complete heart block and required emergent pacer placement. PMH - CABG, DM, afib, cad, chf, copd, cad, bil rotator cuff repair    PT Comments    Pt making steady progress with balance and mobility. Able to tolerate OOB to bsc and then chair. Continue to recommend SNF for further rehab.    Follow Up Recommendations  SNF;Supervision/Assistance - 24 hour     Equipment Recommendations  Other (comment)(to be determined)    Recommendations for Other Services       Precautions / Restrictions Precautions Precautions: Fall;ICD/Pacemaker Restrictions Weight Bearing Restrictions: No    Mobility  Bed Mobility Overal bed mobility: Needs Assistance Bed Mobility: Supine to Sit     Supine to sit: Max assist;HOB elevated     General bed mobility comments: Pt assisting bring his legs to EOB and to elevate trunk into sitting but still requiring total assist  Transfers Overall transfer level: Needs assistance Equipment used: Ambulation equipment used Transfers: Sit to/from Stand Sit to Stand: Mod assist;+2 safety/equipment         General transfer comment: Assist to bring hips up and for balance. Stedy for bed to bsc to chair.   Ambulation/Gait                 Stairs             Wheelchair Mobility    Modified Rankin (Stroke Patients Only)       Balance Overall balance assessment: Needs assistance Sitting-balance support: Feet supported;Bilateral upper extremity supported Sitting balance-Leahy Scale: Poor Sitting balance - Comments: Pt sat EOB x x 5 minutes with min guard   Standing  balance support: Bilateral upper extremity supported Standing balance-Leahy Scale: Poor Standing balance comment: Stood x 4 with Stedy for 10-30 sec with mod assist. Assist to extend hips and trunk. Verbal/tactile cues for pacer precautions.                             Cognition Arousal/Alertness: Awake/alert Behavior During Therapy: Flat affect Overall Cognitive Status: Impaired/Different from baseline Area of Impairment: Orientation;Following commands;Safety/judgement;Problem solving;Attention;Memory                 Orientation Level: Disoriented to;Place;Time;Situation Current Attention Level: Sustained Memory: Decreased short-term memory Following Commands: Follows one step commands inconsistently;Follows one step commands with increased time Safety/Judgement: Decreased awareness of safety;Decreased awareness of deficits   Problem Solving: Slow processing;Decreased initiation;Difficulty sequencing;Requires verbal cues;Requires tactile cues General Comments: Lt inattention      Exercises      General Comments General comments (skin integrity, edema, etc.): VSS. Pt on RA with SpO2 >96%      Pertinent Vitals/Pain Pain Assessment: No/denies pain    Home Living                      Prior Function            PT Goals (current goals can now be found in the care plan section) Acute Rehab PT Goals Patient Stated Goal: get better Progress towards PT goals: Progressing toward goals  Frequency    Min 2X/week      PT Plan Current plan remains appropriate;Frequency needs to be updated    Co-evaluation              AM-PAC PT "6 Clicks" Mobility   Outcome Measure  Help needed turning from your back to your side while in a flat bed without using bedrails?: Total Help needed moving from lying on your back to sitting on the side of a flat bed without using bedrails?: Total Help needed moving to and from a bed to a chair (including a  wheelchair)?: Total Help needed standing up from a chair using your arms (e.g., wheelchair or bedside chair)?: A Lot Help needed to walk in hospital room?: Total Help needed climbing 3-5 steps with a railing? : Total 6 Click Score: 7    End of Session Equipment Utilized During Treatment: Gait belt Activity Tolerance: Patient tolerated treatment well Patient left: with call bell/phone within reach;in chair;with chair alarm set;with family/visitor present Nurse Communication: Mobility status PT Visit Diagnosis: Other abnormalities of gait and mobility (R26.89);Muscle weakness (generalized) (M62.81);Other symptoms and signs involving the nervous system (R29.898)     Time: 1055-1135(extended time for pt on bsc for BM) PT Time Calculation (min) (ACUTE ONLY): 40 min  Charges:  $Therapeutic Activity: 23-37 mins                     [place] Pager [phone] Office [place] 01/27/2020, 12:28 PM

## 2020-01-27 NOTE — Progress Notes (Signed)
TCTS BRIEF PROGRESS NOTE:  Seems more alert today and moving left arm a little more, although still confused.  I do not feel that there is clear evidence of brachial plexus injury and feel that symptoms are more likely related to his stroke.  There does not appear to be significant pain, sensory loss, or focal weakness but rather generalized weakness of the entire left arm and left-side neglect.   Purcell Nails, MD 01/27/2020 3:12 PM

## 2020-01-27 NOTE — Telephone Encounter (Signed)
This encounter was created in error - please disregard.

## 2020-01-27 NOTE — Progress Notes (Signed)
  Speech Language Pathology Treatment: Cognitive-Linquistic  Patient Details Name: Garrett Hughes MRN: [id number] DOB: 1941/07/31 Today's Date: 01/27/2020 Time: 2831-5176 SLP Time Calculation (min) (ACUTE ONLY): 20 min  Assessment / Plan / Recommendation Clinical Impression  Pt was seen for cognitive-linguistic treatment. He was alert throughout the session and more independently communicative than when he was last seen. However, he required increased cueing for memory and reasoning during this session. Prompts were needed throughout the session for focused and sustained attention. He was disoriented to time, place, and situation. Pt was re-oriented multiple times during the session, but exhibited difficulty with recall of this information despite cues. He achieved 16% accuracy with simple reasoning increasing to 50% accuracy with verbal prompts. He demonstrated 50% accuracy with two-item immediate picture recall increasing to 100% with semantic cues. SLP will continue to follow pt.    HPI HPI: Pt is a 79 y.o. male with a hx of CABG 1997, chronic atrial fibrillation (pt declines anticoagulation), aortic stenosis, carotid artery disease, HTN, DM, HLD (intolerant of higher dose of pravastatin or atorvastatin), chronic appearing anemia/thrombocytopenia, GERD, Barrett's esophagus, h/o GIB and urinary tract bleeding, who presented to the ED for evaluation of CHF and syncope. MRI brain 5/15: Numerous small acute infarcts in the right greater than left cerebral hemispheres and left cerebellum compatible with a central embolic source. Suspected occlusion of the right ICA.      SLP Plan  Continue with current plan of care       Recommendations                   Follow up Recommendations: Skilled Nursing facility SLP Visit Diagnosis: Cognitive communication deficit (H60.737) Plan: Continue with current plan of care       Garrett Bobby I. Vear Clock, MS, CCC-SLP Acute Rehabilitation Services Office  number [phone] Pager [phone]                 [name] 01/27/2020, 5:46 PM

## 2020-01-28 DIAGNOSIS — M4854XA Collapsed vertebra, not elsewhere classified, thoracic region, initial encounter for fracture: Secondary | ICD-10-CM | POA: Diagnosis not present

## 2020-01-28 DIAGNOSIS — S0101XA Laceration without foreign body of scalp, initial encounter: Secondary | ICD-10-CM | POA: Diagnosis not present

## 2020-01-28 DIAGNOSIS — Z8673 Personal history of transient ischemic attack (TIA), and cerebral infarction without residual deficits: Secondary | ICD-10-CM | POA: Diagnosis not present

## 2020-01-28 DIAGNOSIS — S0003XA Contusion of scalp, initial encounter: Secondary | ICD-10-CM | POA: Diagnosis not present

## 2020-01-28 DIAGNOSIS — M47812 Spondylosis without myelopathy or radiculopathy, cervical region: Secondary | ICD-10-CM | POA: Diagnosis not present

## 2020-01-28 DIAGNOSIS — W1839XA Other fall on same level, initial encounter: Secondary | ICD-10-CM | POA: Diagnosis not present

## 2020-01-28 DIAGNOSIS — Z952 Presence of prosthetic heart valve: Secondary | ICD-10-CM | POA: Diagnosis not present

## 2020-01-28 DIAGNOSIS — I6523 Occlusion and stenosis of bilateral carotid arteries: Secondary | ICD-10-CM | POA: Diagnosis not present

## 2020-01-28 DIAGNOSIS — S0181XA Laceration without foreign body of other part of head, initial encounter: Secondary | ICD-10-CM | POA: Diagnosis not present

## 2020-01-28 DIAGNOSIS — Z950 Presence of cardiac pacemaker: Secondary | ICD-10-CM | POA: Diagnosis not present

## 2020-01-28 DIAGNOSIS — R410 Disorientation, unspecified: Secondary | ICD-10-CM | POA: Diagnosis not present

## 2020-01-28 DIAGNOSIS — I700 Atherosclerosis of aorta: Secondary | ICD-10-CM | POA: Diagnosis not present

## 2020-01-28 DIAGNOSIS — M4802 Spinal stenosis, cervical region: Secondary | ICD-10-CM | POA: Diagnosis not present

## 2020-01-28 LAB — GLUCOSE, CAPILLARY
Glucose-Capillary: 150 mg/dL — ABNORMAL HIGH (ref 70–99)
Glucose-Capillary: 165 mg/dL — ABNORMAL HIGH (ref 70–99)

## 2020-01-28 MED ORDER — ASPIRIN 325 MG PO TBEC: 325.0000 mg | DELAYED_RELEASE_TABLET | Freq: Every day | ORAL | 0 refills | Status: DC

## 2020-01-28 NOTE — Progress Notes (Signed)
Plan of care reviewed. Pt appeared alert, confused, disoriented to time, place and situations. He appeared agitated, restless, compulsively pulled wires and monitor and tried to get out of bed. Pt used cuss words toward staff when we tried to help him back to bed, reoriented and reassured him. Floor mat and bed alarm activated, bed located next to nurse station.   Pt's stated he wants to take an airplane flight to meet with his wife who's waiting for him at home. He was more cooperated after we decided to go along and take him on a wheel chair tour and ride him around our unit hallway and at nurse station with Delilah Shan NT as a Air cabin crew.  Eventually, he got more tired from his imaginary traveling and felt sleepy and reqested to go back to his bed after 3 hours riding on wheelchair met and greeted with all staff. Then he slept very well after that.   He's hemodynamically stable. Remained afebrile,  Temp 97.7-98, EKG:V- paced on monitor,  HR 60,  BP 152/97 mmHg, RR 16-18,  SPO2 98-100% on room air.  CBG at bed time 115 mg/dl.   No acute distress noted. We continue to monitor.  Kennyth Lose, RN

## 2020-01-28 NOTE — Progress Notes (Signed)
Patient's family has questions about pacemaker prior to discharge. EP PA notified via Amion page at this time.

## 2020-01-28 NOTE — Progress Notes (Addendum)
IV and telemetry discontinued at this. CCMD notified. Awaiting PTAR transportation to [facility]. Patient's wife and daughter at bedside

## 2020-01-28 NOTE — TOC Progression Note (Addendum)
Transition of Care [facility]) - Progression Note    Patient Details  Name: Garrett Hughes MRN: [id number] Date of Birth: 04-24-1941  Transition of Care [facility]) CM/SW Contact  Nonda Lou, [place] Phone Number: 01/28/2020, 8:50 AM  Clinical Narrative:    CSW spoke with patient's wife [place] and confirmed SNF choice of [address]. CSW verified insurance authorization 5/19-5/21.    CSW spoke with [facility] and confirmed bed available. Wife and son will sign patient in at 11:30a.   CSW contacted MD office and left message with receptionist who agreed to provide to nurse, of SNF placement readiness. CSW will continue to follow.  Expected Discharge Plan: Skilled Nursing Facility Barriers to Discharge: English as a second language teacher, Continued Medical Work up, SNF Pending bed offer  Expected Discharge Plan and Services Expected Discharge Plan: Skilled Nursing Facility In-house Referral: Clinical Social Work                                             Social Determinants of Health (SDOH) Interventions    Readmission Risk Interventions No flowsheet data found.

## 2020-01-28 NOTE — Plan of Care (Signed)
Continue to monitor

## 2020-01-28 NOTE — Progress Notes (Addendum)
[place] VALVE TEAM  Patient Name: Garrett Hughes Date of Encounter: 01/28/2020  Primary Cardiologist: Dr. Stanford Breed / Dr. Burt Knack & Dr. Roxy Manns (TAVR)  Hospital Problem List     Principal Problem:   S/P TAVR (transcatheter aortic valve replacement) Active Problems:   Benign essential hypertension   History of Barrett's esophagus   Hx of CABG   Hyperlipemia   Low back pain   Type 2 diabetes mellitus with kidney complication, without long-term current use of insulin (HCC)   GERD (gastroesophageal reflux disease)   Chronic atrial fibrillation (HCC)   CAD (coronary artery disease)   Acute on chronic diastolic CHF (congestive heart failure) (HCC)   Severe aortic stenosis   Carotid artery disease (HCC)   COPD (chronic obstructive pulmonary disease) ([place])   Cerebral embolism with cerebral infarction     Subjective   No complaints. Oriented to place today. Seems more alert. Left arm moving better. Wearing heated mittens.   Inpatient Medications    Scheduled Meds: . aspirin EC  325 mg Oral Daily  . Chlorhexidine Gluconate Cloth  6 each Topical Daily  . clopidogrel  75 mg Oral Daily  . ferrous sulfate  325 mg Oral Q breakfast  . furosemide  20 mg Oral Daily  . insulin aspart  0-24 Units Subcutaneous TID AC & HS  . insulin aspart  5 Units Subcutaneous TID WC  . insulin glargine  5 Units Subcutaneous Daily  . lisinopril  5 mg Oral Daily  . metoprolol succinate  25 mg Oral Daily  . pantoprazole  20 mg Oral Daily  . pravastatin  40 mg Oral Daily  . sodium chloride flush  3 mL Intravenous Q12H   Continuous Infusions: . sodium chloride    . sodium chloride Stopped (01/20/20 0135)  . sodium chloride Stopped (01/24/20 1533)  . sodium chloride Stopped (01/25/20 0706)   PRN Meds: sodium chloride, acetaminophen **OR** acetaminophen, ondansetron (ZOFRAN) IV, sodium chloride flush   Vital Signs    Vitals:   01/28/20 0354 01/28/20 0401  01/28/20 0800 01/28/20 1004  BP: 133/75  (!) 108/50 (!) 142/67  Pulse: (!) 59  60 60  Resp: 17  13 18   Temp: 98.1 F (36.7 C)  (!) 97.4 F (36.3 C)   TempSrc: Oral  Oral   SpO2: 100%  100%   Weight:  55.3 kg    Height:        Intake/Output Summary (Last 24 hours) at 01/28/2020 1048 Last data filed at 01/28/2020 0300 Gross per 24 hour  Intake 730 ml  Output 200 ml  Net 530 ml   Filed Weights   01/26/20 0313 01/27/20 0500 01/28/20 0401  Weight: 58.3 kg 57.6 kg 55.3 kg    Physical Exam   GEN: chronically ill appearing, sitting up in chair, seems more alert today.  HEENT: Grossly normal.  Neck: Supple, no JVD, carotid bruits, or masses. Cardiac: 2/6 SEM @ RUSB. No rubs, or gallops. No clubbing, cyanosis, edema.   Respiratory:  Respirations regular and unlabored, clear to auscultation bilaterally. GI: Soft, nontender, nondistended, BS + x 4. MS: no deformity or atrophy. Skin: warm and dry, no rash. Neuro:  Oriented to self and place today. Left arm moving better Psych: AAOx3.  Normal affect.  Labs    CBC Recent Labs    01/26/20 0813  WBC 8.6  HGB 10.8*  HCT 33.4*  MCV 96.5  PLT 161   Basic Metabolic Panel Recent  Labs    01/26/20 0813  NA 141  K 4.0  CL 103  CO2 25  GLUCOSE 169*  BUN 23  CREATININE 1.17  CALCIUM 8.8*   Liver Function Tests No results for input(s): AST, ALT, ALKPHOS, BILITOT, PROT, ALBUMIN in the last 72 hours. No results for input(s): LIPASE, AMYLASE in the last 72 hours. Cardiac Enzymes No results for input(s): CKTOTAL, CKMB, CKMBINDEX, TROPONINI in the last 72 hours. BNP Invalid input(s): POCBNP D-Dimer No results for input(s): DDIMER in the last 72 hours. Hemoglobin A1C No results for input(s): HGBA1C in the last 72 hours. Fasting Lipid Panel No results for input(s): CHOL, HDL, LDLCALC, TRIG, CHOLHDL, LDLDIRECT in the last 72 hours. Thyroid Function Tests No results for input(s): TSH, T4TOTAL, T3FREE, THYROIDAB in the last 72  hours.  Invalid input(s): FREET3  Telemetry    paced  - Personally Reviewed  ECG    V paced  - Personally Reviewed  Radiology    No results found.  Cardiac Studies   TAVR OPERATIVE NOTE   Date of Procedure:01/18/2020  Preoperative Diagnosis:Severe Aortic Stenosis   Postoperative Diagnosis:Same   Procedure:   Transcatheter Aortic Valve Replacement - Left Trans-SubclavianApproach Edwards Sapien 3 UltraTHV (size 79mm, model # [id number], serial # [id number])  Co-Surgeons:Clarence H. Cornelius Moras, MD and [name], MD  Anesthesiologist:David Noreene Larsson, MD  Echocardiographer:Mihai Croitoru, MD  Pre-operative Echo Findings: ? Severe aortic stenosis ? Normalleft ventricular systolic function  Post-operative Echo Findings: ? Noparavalvular leak ? Normalleft ventricular systolic function  _______________   Echo5/12/21: IMPRESSIONS  1. Left ventricular ejection fraction, by estimation, is 70 to 75%. The left ventricle has hyperdynamic function. The left ventricle has no regional wall motion abnormalities. There is mild concentric left  ventricular hypertrophy. Left ventricular diastolic function could not be evaluated. There is the interventricular septum is flattened in systole, consistent with right ventricular pressure overload.  2. Right ventricular systolic function is mildly reduced. The right  ventricular size is moderately enlarged. There is severely elevated  pulmonary artery systolic pressure. The estimated right ventricular  systolic pressure is 85.2 mmHg.  3. The mitral valve is normal in structure. Mild to moderate mitral valve regurgitation.  4. Tricuspid valve regurgitation is moderate to severe.  5. The aortic valve has been repaired/replaced. Aortic valve  regurgitation is not visualized. There is a 26 mm Edwards Sapien   prosthetic (TAVR) valve present in the aortic position. Procedure Date:  01/18/2020. Echo findings are consistent with normal  structure and function of the aortic valve prosthesis. Aortic valve mean  gradient measures 9.3 mmHg. Aortic valve Vmax measures 2.24 m/s.  6. The inferior vena cava is dilated in size with <50% respiratory  variability, suggesting right atrial pressure of 15 mmHg.   Comparison(s): There is marked worsening of pulmonary artery hypertension and severity of tricuspid regurgitation since the intra-procedural study.   Patient Profile     Garrett Hughes is a 79 y.o. male with a history of CAD s/p CABG 1997 and recent PCI of RCA (12/24/19), RBBB, chronic atrial fibrillation (pt declines anticoagulation),high grade carotid artery disease,HTN, DMT2, HLD, chronic appearing anemia/thrombocytopenia, GERD, Barrett's esophagus, h/o GIB and urinary tract bleeding and severe AS who presented to [facility] on 01/18/20 for planned TAVR.  Assessment & Plan   Severe AS:s/p successful TAVR with a35mm Edwards Sapien 3 THV via the left subclavianapproach on 01/18/20. Operative course complicated by balloon rupture during valve deployment and axillary artery damage, this was successfully repaired and there was no  significant vascular injury by flouroscopy.Post operative echo showed EF 70%, normally functioning TAVR with a mean gradient of 9.3 mm Hg and no PVL. There was mod-severe TR and pulm HTN. Groin site/subclavian siteare stable. He then developed profound bradycardia with a junctional rhythm and required PPM on 5/17. Continue Asprin 325mg  daily andplavix 75 mg daily.  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 01/26/20.  Acute CVA: pt developed AMS post operatively. Stat head CT was negative. Follow up MRI showed numerous small acute infarcts in the right greater than left cerebral hemispheres and left cerebellum compatible with a central embolic source. CTA and carotid  dopplers also showed total occlusion on RICA. Etiology could be multiple, large vessel occlusion/stenosis in the setting of procedure/low BP vs. TAVR procedure related vs. chronic afib not on AC. Neurology has recommended asa 325 mg daily and plavix 75mg  daily.   HTN: BP better controlled today  Acute on chronic anemia: he was transfused 1U PRBCS. Hg has remained stable  Acute on chronic diastolic CHF: patient presented with progressive dyspnea and New York Heart Association functional class III symptoms, elevated BNP, and increasing diuretic requirement. His postoperative day #1 echo demonstrates evidence of pulmonary hypertension and RV volume overload. There as interstitial edema noted on x-ray. Restarted on home lasix 20mg  daily. Creat has remained stable.   CAD:s/p complex atherectomy and PCIoncomplicated by dissection/contained perforation treated with overlapping DES and a Papyrus covered stenton 12/24/19. No recurrent angina. Continue ASA and plavix(long term).  Chronic afib: underlying afib. Has previously refused oral anticoagulation and has a history of GI/GU bleeding.   DMT2: continue SSI  Carotid artery disease: 80-99% RICA stenosis. Followed by Dr. ( possible TCAR candidate).CTA head and neck completed in hospital and showed right ICA occlusion at origin, severe stenosis of proximal left ICA, >80% stenosis.  Bilateral VA origin stenosis, 70% on the right, 50% on the left.  Dense calcification plaque at the basilar tip, new from previous CT, with bilateral stenosis of P1 origins. Dr. saw him yesterday and recommends no revascularization for RICA but he will need left carotid intervention either endarterectomy or TCAR once his new baseline neurologic status is established.  Left arm weakness: most likely related to CVA vs brachial plexus injury  Dispo: waiting on SNF placement.   Signed4/18/21, PA-C  01/28/2020, 10:48 AM  Pager  [phone]  Patient seen, examined. Available data reviewed. Agree with findings, assessment, and plan as outlined by Cline Crock, PA-C. Exam unchanged. He is alert to self, answers questions appropriately. Heart is RRR with 2/6 systolic murmur throughout, lungs CTA, extremities without edema, abd soft and nontender. I spoke with his son, 01/30/2020, who is with his mother, currently signing papers at a Skilled Nursing Facility. Pt medically stable for discharge when bed is available. Plan outpatient vascular surgery follow-up pending his functional recovery.  [phone], M.D. 01/28/2020 11:33 AM

## 2020-01-28 NOTE — Progress Notes (Addendum)
Patient left via [facility] at this time. Report called to Clydie Braun, nurse at receiving facility. All questions answered.

## 2020-01-28 NOTE — Consult Note (Signed)
   [facility] Inpatient Consult   01/28/2020  [name] [birth year] [id number]  [facility] ACO Patient:  [name]  Patient was assessed for [facility] [employer] [facility] Care Management for [employer]. Patient was previously outreached by a  [facility] Care Management.  Chart reviewed for disposition and needs.    Plan:  Patient is currently transitioning to a skilled nursing facility level of care to [facility] for rehab, noted as reviewed in electronic medical record.  No [facility] Care Management needs noted.  Of note, [facility] Care Management services does not replace or interfere with any services that are arranged by inpatient [facility] care management team.   For additional questions or referrals please contact:  Charlesetta Shanks, RN BSN CCM [facility]  [phone] business mobile phone Toll free office [phone]  Fax number: [phone] [email or web address]

## 2020-01-28 NOTE — TOC Transition Note (Signed)
Transition of Care [facility]) - CM/SW Discharge Note   Patient Details  Name: Garrett Hughes MRN: [id number] Date of Birth: 08/17/1941  [facility] of [facility]) CM/SW Contact:  Luiz Blare Phone Number: 01/28/2020, 12:12 PM   Clinical Narrative:    RN to call report prior to discharge [phone] [facility] nurse).  Patient will DC to: [facility] Anticipated DC date: 5/21 Family notified: [place], spouse Transport by: Sharin Mons   Per MD patient ready for DC to [facility]. RN, patient, patient's family, and facility notified of DC. Discharge Summary and FL2 sent to facility. DC packet on chart. Ambulance transport requested for patient.   CSW will sign off for now as social work intervention is no longer needed. Please consult [place] again if new needs arise.     Final next level of care: Skilled Nursing Facility Barriers to Discharge: No Barriers Identified   Patient Goals and CMS Choice   CMS Medicare.gov Compare Post Acute Care list provided to:: Patient Represenative (must comment)(Spouse) Choice offered to / list presented to : Spouse  Discharge Placement              Patient chooses bed at: [facility] Patient to be transferred to facility by: [facility] Name of family member notified: [place], spouse Patient and family notified of of transfer: 01/28/20  Discharge Plan and Services In-house Referral: Clinical Social Work                                   Social Determinants of Health (SDOH) Interventions     Readmission Risk Interventions No flowsheet data found.

## 2020-01-29 DIAGNOSIS — I63342 Cerebral infarction due to thrombosis of left cerebellar artery: Secondary | ICD-10-CM | POA: Diagnosis not present

## 2020-01-29 DIAGNOSIS — W19XXXA Unspecified fall, initial encounter: Secondary | ICD-10-CM | POA: Diagnosis not present

## 2020-01-29 DIAGNOSIS — Z7401 Bed confinement status: Secondary | ICD-10-CM | POA: Diagnosis not present

## 2020-01-29 DIAGNOSIS — R9431 Abnormal electrocardiogram [ECG] [EKG]: Secondary | ICD-10-CM | POA: Diagnosis not present

## 2020-01-29 DIAGNOSIS — R5381 Other malaise: Secondary | ICD-10-CM | POA: Diagnosis not present

## 2020-01-29 DIAGNOSIS — I6521 Occlusion and stenosis of right carotid artery: Secondary | ICD-10-CM | POA: Diagnosis not present

## 2020-01-29 DIAGNOSIS — I480 Paroxysmal atrial fibrillation: Secondary | ICD-10-CM | POA: Diagnosis not present

## 2020-01-29 DIAGNOSIS — J984 Other disorders of lung: Secondary | ICD-10-CM | POA: Diagnosis not present

## 2020-01-29 DIAGNOSIS — I5032 Chronic diastolic (congestive) heart failure: Secondary | ICD-10-CM | POA: Diagnosis not present

## 2020-01-29 DIAGNOSIS — Z950 Presence of cardiac pacemaker: Secondary | ICD-10-CM | POA: Diagnosis not present

## 2020-01-29 DIAGNOSIS — I5033 Acute on chronic diastolic (congestive) heart failure: Secondary | ICD-10-CM | POA: Diagnosis not present

## 2020-01-29 DIAGNOSIS — Z952 Presence of prosthetic heart valve: Secondary | ICD-10-CM | POA: Diagnosis not present

## 2020-01-29 DIAGNOSIS — I10 Essential (primary) hypertension: Secondary | ICD-10-CM | POA: Diagnosis not present

## 2020-01-29 DIAGNOSIS — D508 Other iron deficiency anemias: Secondary | ICD-10-CM | POA: Diagnosis not present

## 2020-01-29 DIAGNOSIS — G4709 Other insomnia: Secondary | ICD-10-CM | POA: Diagnosis not present

## 2020-01-29 DIAGNOSIS — I350 Nonrheumatic aortic (valve) stenosis: Secondary | ICD-10-CM | POA: Diagnosis not present

## 2020-01-29 DIAGNOSIS — R0902 Hypoxemia: Secondary | ICD-10-CM | POA: Diagnosis not present

## 2020-01-29 DIAGNOSIS — I5189 Other ill-defined heart diseases: Secondary | ICD-10-CM | POA: Diagnosis not present

## 2020-01-29 DIAGNOSIS — E1129 Type 2 diabetes mellitus with other diabetic kidney complication: Secondary | ICD-10-CM | POA: Diagnosis not present

## 2020-01-29 DIAGNOSIS — I482 Chronic atrial fibrillation, unspecified: Secondary | ICD-10-CM | POA: Diagnosis not present

## 2020-01-29 DIAGNOSIS — I6389 Other cerebral infarction: Secondary | ICD-10-CM | POA: Diagnosis not present

## 2020-01-29 DIAGNOSIS — M545 Low back pain: Secondary | ICD-10-CM | POA: Diagnosis not present

## 2020-01-29 DIAGNOSIS — Z48812 Encounter for surgical aftercare following surgery on the circulatory system: Secondary | ICD-10-CM | POA: Diagnosis not present

## 2020-01-29 DIAGNOSIS — I69354 Hemiplegia and hemiparesis following cerebral infarction affecting left non-dominant side: Secondary | ICD-10-CM | POA: Diagnosis not present

## 2020-01-29 DIAGNOSIS — M2550 Pain in unspecified joint: Secondary | ICD-10-CM | POA: Diagnosis not present

## 2020-01-29 DIAGNOSIS — Z743 Need for continuous supervision: Secondary | ICD-10-CM | POA: Diagnosis not present

## 2020-01-29 DIAGNOSIS — I779 Disorder of arteries and arterioles, unspecified: Secondary | ICD-10-CM | POA: Diagnosis not present

## 2020-01-29 DIAGNOSIS — I2510 Atherosclerotic heart disease of native coronary artery without angina pectoris: Secondary | ICD-10-CM | POA: Diagnosis not present

## 2020-01-31 ENCOUNTER — Telehealth: Payer: Self-pay | Admitting: Cardiology

## 2020-01-31 DIAGNOSIS — R5381 Other malaise: Secondary | ICD-10-CM | POA: Diagnosis not present

## 2020-01-31 DIAGNOSIS — E1129 Type 2 diabetes mellitus with other diabetic kidney complication: Secondary | ICD-10-CM | POA: Diagnosis not present

## 2020-01-31 DIAGNOSIS — I779 Disorder of arteries and arterioles, unspecified: Secondary | ICD-10-CM | POA: Diagnosis not present

## 2020-01-31 DIAGNOSIS — I5189 Other ill-defined heart diseases: Secondary | ICD-10-CM | POA: Diagnosis not present

## 2020-01-31 DIAGNOSIS — J984 Other disorders of lung: Secondary | ICD-10-CM | POA: Diagnosis not present

## 2020-01-31 NOTE — Telephone Encounter (Signed)
Spoke with pt wife, the patient is currently in a rehab facility and she needs the oxygen picked up from the home as it is not being used. The oxygen was supplied by [employer], their number is [phone]. They told the wife the order came from [employer]. Will forward this information to the TAVR team.

## 2020-01-31 NOTE — Telephone Encounter (Signed)
I spoke with [employer] and they require a faxed order to DC oxygen.  The order can be faxed to [phone].  I will fax this when Garrett Hughes is back in the office on 5/25.

## 2020-01-31 NOTE — Telephone Encounter (Signed)
Patient's wife calling in because she needs to have her husbands oxygen tank picked up and have the account cancelled. She states that her husband is not living there anymore and does not want to keep paying for it.

## 2020-02-01 DIAGNOSIS — R9431 Abnormal electrocardiogram [ECG] [EKG]: Secondary | ICD-10-CM | POA: Diagnosis not present

## 2020-02-01 NOTE — Telephone Encounter (Signed)
DC oxygen order faxed.

## 2020-02-02 ENCOUNTER — Telehealth: Payer: Self-pay | Admitting: Emergency Medicine

## 2020-02-02 NOTE — Telephone Encounter (Signed)
Opened in error

## 2020-02-03 ENCOUNTER — Ambulatory Visit: Payer: Medicare Other

## 2020-02-04 DIAGNOSIS — M545 Low back pain: Secondary | ICD-10-CM | POA: Diagnosis not present

## 2020-02-04 DIAGNOSIS — R0902 Hypoxemia: Secondary | ICD-10-CM | POA: Diagnosis not present

## 2020-02-04 DIAGNOSIS — G4709 Other insomnia: Secondary | ICD-10-CM | POA: Diagnosis not present

## 2020-02-05 NOTE — Progress Notes (Deleted)
HE[facility] AND [place]                                       Cardiology Office Note    Date:  02/05/2020   ID:  Garrett Hughes, DOB [birth year], MRN [id number]  PCP:  Garrett Reeve, DO  Cardiologist: Garrett Ruths, MD / Garrett Hughes & Garrett Hughes (TAVR)  CC: 1 month s/p TAVR   History of Present Illness:  Garrett Hughes is a 79 y.o. male with a history of CAD s/p CABG 1997 and recent PCI of RCA (12/24/19), RBBB, chronic atrial fibrillation (pt declines anticoagulation),high grade carotid artery disease,HTN, DMT2, HLD, chronic appearing anemia/thrombocytopenia, GERD, Barrett's esophagus, h/o GIB and urinary tract bleeding and severe ASwho presented to [facility] on [facility]/18/20 for planned TAVR.  Patient's cardiac history dates back to 1997 when he first presented with coronary artery disease. He underwent coronary artery bypass grafting x3 at [facility] in [place]. He has been followed for the last several years by Dr. Stanford Hughes with history of aortic stenosis, coronary artery disease, and persistent atrial fibrillation. He was anticoagulated using warfarin for a period of time but this was stopped because of problems with GI bleeding and hematuria and difficulty maintaining therapeutic levels.  Patient has known history of aortic stenosis that has progressed on serial follow-up echocardiograms. He also has a long history of exertional shortness of breath that has slowly progressed and become quite problematic over the last few months. Recent echocardiogram performed October 19, 2019 revealed normal left ventricular systolic function with severe aortic stenosis, moderate aortic insufficiency and moderate mitral regurgitation. He was seen in follow-up by Dr. Stanford Hughes and referred to the multidisciplinary heart valve clinic. Diagnostic cardiac catheterization performed by Garrett Hughes on November 26, 2019 revealed severe multivessel coronary  artery disease with chronic occlusion of the left coronary artery and severe high-grade stenosis of the right coronary artery. There was continued patency of left internal mammary artery to the left anterior descending coronary artery and greater saphenous vein graft to the obtuse marginal branch. Right internal mammary artery graft previously placed to the right coronary artery was atretic. The patient's case was reviewed by multidisciplinary team of specialists and the patient subsequently was brought back to the Cath Lab on December 24, 2019 for PCI and stenting of the right coronary artery. This procedure was complicated by coronary perforation requiring placement of a covered stent.Following the procedure the patient had 2 days of chest pain but serial echocardiograms revealed no significant pericardial effusion. The patient was discharged from the hospital but readmitted December 28, 2019 following a syncopal episode that was felt likely to be related to his aortic stenosis, atrial fibrillation, and dehydration. Medications were adjusted and a ZIOpatch monitor placed.   He was evaluated by the multidisciplinary valve team and underwent successful TAVR with a68m Edwards Sapien 3 THV via the left subclavianapproach on 01/18/20. Operative course complicated by balloon rupture during valve deployment and axillary artery damage, this was successfully repaired and there was no significant vascular injury by flouroscopy.Post operative echoshowed EF 70%, normally functioning TAVR with a mean gradient of 9.3 mm Hg and no PVL. There was mod-severe TR and pulm HTN. He then developed profound bradycardia with a junctional rhythm and required PPM on 5/17. He developed AMS post operatively. Stat head CT was negative. Follow up MRI showed  numerous small acute infarcts in the right greater than left cerebral hemispheres and left cerebellum compatible with a central embolic source. CTA and carotid dopplers also showed  total occlusion on RICA. Etiology could be multiple, large vessel occlusion/stenosis in the setting of procedure/low BP vs. TAVR procedure related vs. chronic afib not on AC. Neurology recommended asa 325 mg daily and plavix 40m daily. He also had acute volume overload requiring IV diuresis as well as acute on chronic anemia requiring a transfusion of 1 U PRBCs. He was discharged to a SNF.  Today he presents to clinic for follow up.   Past Medical History:  Diagnosis Date  . Anemia   . Arthritis   . Atherosclerosis of native coronary artery of native heart without angina pectoris 01/05/2016  . Benign essential hypertension 01/05/2016  . Carotid artery disease ([place]   . Cataract, nuclear, right 05/06/2014  . Chronic atrial fibrillation ([place] 12/20/2015  . Chronic diarrhea   . COPD (chronic obstructive pulmonary disease) (HCC)    on nocturnal O2  . Current use of long term anticoagulation 12/20/2015  . Diabetic kidney disease ([facility] 01/05/2016  . GERD (gastroesophageal reflux disease)   . GI bleed   . History of Barrett's esophagus 12/20/2015  . History of blood in urine   . History of kidney stones   . 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 Hyperlipemia 10/26/2015  . Low back pain 05/02/2016  . S/P TAVR (transcatheter aortic valve replacement) 01/18/2020   s/p successful TAVR w/ a 26 mm Edwards S3U via the left subclavian approach (failed Shockwave TF) with Dr. CBurt Knackand Dr. ORoxy Hughes  . Severe aortic stenosis   . Thrombocytopenia ([facility]   . Type 2 diabetes mellitus with kidney complication, without long-term current use of insulin ([place] 07/03/2016    Past Surgical History:  Procedure Laterality Date  . AORTOGRAM  01/18/2020   Procedure: Aortogram;  Surgeon: CSherren Mocha MD;  [place] [facility] [place]  Service: Open Heart Surgery;;  . BACK SURGERY  2004  . [place] . CORONARY ARTERY BYPASS GRAFT  1997  . CORONARY ATHERECTOMY N/A 12/24/2019     Procedure: CORONARY ATHERECTOMY;  Surgeon: CSherren Mocha MD;  [place];  Service: Cardiovascular;  Laterality: N/A;  . INTRAOPERATIVE TRANSESOPHAGEAL ECHOCARDIOGRAM  01/18/2020   Procedure: Intraoperative Transesophageal Echocardiogram;  Surgeon: CSherren Mocha MD;  [place] [facility]  Service: Open Heart Surgery;;  . INTRAOPERATIVE TRANSTHORACIC ECHOCARDIOGRAM N/A 01/18/2020   Procedure: Intraoperative Transthoracic Echocardiogram;  Surgeon: CSherren Mocha MD;  [place] [facility]  Service: Open Heart Surgery;  Laterality: N/A;  . LOWER EXTREMITY ANGIOGRAM  01/18/2020   Procedure: Lower Extremity Angiogram;  Surgeon: CSherren Mocha MD;  [place]  Service: Open Heart Surgery;;  Shockwave  . PACEMAKER IMPLANT N/A 01/24/2020   Procedure: PACEMAKER IMPLANT;  Surgeon: KDeboraha Sprang MD;  [place] [facility];  Service: Cardiovascular;  Laterality: N/A;  . RIGHT HEART CATH AND CORONARY/GRAFT ANGIOGRAPHY N/A 11/26/2019   Procedure: RIGHT HEART CATH AND CORONARY/GRAFT ANGIOGRAPHY;  Surgeon: CSherren Mocha MD;  [place];  Service: Cardiovascular;  Laterality: N/A;  . SHOULDER SURGERY  2005  . TRANSCATHETER AORTIC VALVE REPLACEMENT, TRANSFEMORAL Left 01/18/2020   Procedure: Transcatheter Aortic Valve Replacement (subclavian approach);  Surgeon: CSherren Mocha MD;  [place]  Service: Open Heart Surgery;  Laterality: Left;    Current Medications:  Outpatient Medications Prior to Visit  Medication Sig Dispense Refill  . ACCU-CHEK SOFTCLIX LANCETS lancets Use as instructed to check blood sugar up to qid. Dx Code E11.22. 100 each 12  . Alcohol Swabs PADS Use as instructed to check blood sugar up to qid. Dx code E11.22 100 each 1  . AMBULATORY NON FORMULARY MEDICATION Medication Name: accu-chek aviva plus meter, accu-check aviva plus test strips & accu-chek softclix lancets to test twice a day. Dx: E11.29 1 vial PRN  . aspirin EC 325 MG EC  tablet Take 1 tablet (325 mg total) by mouth daily. 30 tablet 0  . Blood Glucose Calibration (ACCU-CHEK AVIVA) SOLN Use as instructed per manufacturer directions. Dx code E11.22 1 each 0  . Blood Glucose Monitoring Suppl (ACCU-CHEK AVIVA PLUS) w/Device KIT Use as instructed to check blood sugar up to qid. Dx code E11.22 1 kit 0  . clopidogrel (PLAVIX) 75 MG tablet Take 75 mg by mouth daily.    Marland Kitchen doxazosin (CARDURA) 8 MG tablet TAKE 1 TABLET BY MOUTH  DAILY (Patient not taking: Reported on 01/10/2020) 90 tablet 0  . ferrous sulfate 325 (65 FE) MG EC tablet Take 1 tablet (325 mg total) by mouth daily with breakfast. 90 tablet 3  . furosemide (LASIX) 40 MG tablet Take 0.5 tablets (20 mg total) by mouth daily.    Marland Kitchen glipiZIDE (GLUCOTROL) 5 MG tablet TAKE 1 TABLET BY MOUTH  TWICE DAILY BEFORE MEALS (Patient taking differently: Take 5 mg by mouth 2 (two) times daily before a meal. TAKE 1 TABLET BY MOUTH  TWICE DAILY BEFORE MEALS) 180 tablet 0  . glucose blood (ACCU-CHEK AVIVA PLUS) test strip Use as instructed to check blood sugar up to qid. Dx Code E11.22. Disp generic per pt preference / insurance coverage 100 each 99  . Lancets (ONETOUCH ULTRASOFT) lancets Use as instructed to check blood sugar up to qid. Dx: E11.22 100 each 99  . lisinopril (ZESTRIL) 5 MG tablet TAKE 1 TABLET BY MOUTH  DAILY (Patient not taking: Reported on 01/10/2020) 90 tablet 0  . metFORMIN (GLUCOPHAGE) 1000 MG tablet TAKE 1 TABLET BY MOUTH  TWICE DAILY WITH A MEAL (Patient taking differently: Take 1,000 mg by mouth 2 (two) times daily with a meal. TAKE 1 TABLET BY MOUTH  TWICE DAILY WITH A MEAL) 180 tablet 0  . metoprolol succinate (TOPROL-XL) 25 MG 24 hr tablet Take 1 tablet (25 mg total) by mouth daily. 90 tablet 0  . Multiple Vitamin (MULTIVITAMIN WITH MINERALS) TABS tablet Take 1 tablet by mouth daily.    . nitroGLYCERIN (NITROSTAT) 0.4 MG SL tablet Place 1 tablet (0.4 mg total) under the tongue every 5 (five) minutes as needed for  chest pain. 30 tablet 1  . pantoprazole (PROTONIX) 20 MG tablet TAKE 1 TABLET BY MOUTH  DAILY (Patient taking differently: Take 20 mg by mouth daily. ) 90 tablet 0  . pravastatin (PRAVACHOL) 40 MG tablet Take 1 tablet (40 mg total) by mouth daily. 90 tablet 0   No facility-administered medications prior to visit.     Allergies:   Atorvastatin, Cholestyramine, and Pravastatin   Social History   Socioeconomic History  . Marital status: Married    Spouse name: Edd Fabian  . Number of children: 2  . Years of education: 72  . Highest education level: 11th grade  Occupational History  . Occupation: truck Geophysicist/field seismologist    Comment: retired  Tobacco Use  . Smoking status: Former Smoker    Quit date:  09/10/1987    Years since quitting: 32.4  . Smokeless tobacco: Never Used  Substance and Sexual Activity  . Alcohol use: No  . Drug use: No  . Sexual activity: Not Currently  Other Topics Concern  . 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 Keeps grandsons and granddaughter   Social Determinants of Health   Financial Resource Strain:   . Difficulty of Paying Living Expenses:   Food Insecurity: No Food Insecurity  . Worried About Charity fundraiser in the Last Year: Never true  . Ran Out of Food in the Last Year: Never true  Transportation Needs: No Transportation Needs  . Lack of Transportation (Medical): No  . Lack of Transportation (Non-Medical): No  Physical Activity:   . Days of Exercise per Week:   . Minutes of Exercise per Session:   Stress:   . Feeling of Stress :   Social Connections:   . Frequency of Communication with Friends and Family:   . Frequency of Social Gatherings with Friends and Family:   . Attends Religious Services:   . Active Member of Clubs or Organizations:   . Attends Archivist Meetings:   Marland Kitchen Marital Status:      Family History:  The patient's family history  includes CAD in his father.     ROS:   Please see the history of present illness.    ROS All other systems reviewed and are negative.   PHYSICAL EXAM:   VS:  There were no vitals taken for this visit.   GEN: Well nourished, well developed, in no acute distress HEENT: normal Neck: no JVD or masses Cardiac: ***RRR; no murmurs, rubs, or gallops,no edema  Respiratory:  clear to auscultation bilaterally, normal work of breathing GI: soft, nontender, nondistended, + BS MS: no deformity or atrophy Skin: warm and dry, no rash Neuro:  Alert and Oriented x 3, Strength and sensation are intact Psych: euthymic mood, full affect   Wt Readings from Last 3 Encounters:  01/28/20 121 lb 14.6 oz (55.3 kg)  01/13/20 147 lb 3.2 oz (66.8 kg)  01/10/20 147 lb (66.7 kg)      Studies/Labs Reviewed:   EKG:  EKG is*** ordered today.  The ekg ordered today demonstrates ***  Recent Labs: 12/28/2019: TSH 3.429 01/13/2020: ALT 16; B Natriuretic Peptide 416.5 01/20/2020: Magnesium 1.9 01/26/2020: BUN 23; Creatinine, Ser 1.17; Hemoglobin 10.8; Platelets 226; Potassium 4.0; Sodium 141   Lipid Panel    Component Value Date/Time   CHOL 141 01/23/2020 0536   TRIG 83 01/23/2020 0536   HDL 51 01/23/2020 0536   CHOLHDL 2.8 01/23/2020 0536   VLDL 17 01/23/2020 0536   LDLCALC 73 01/23/2020 0536   LDLCALC 67 09/21/2019 0922    Additional studies/ records that were reviewed today include:  TAVR OPERATIVE NOTE   Date of Procedure:01/18/2020  Preoperative Diagnosis:Severe Aortic Stenosis   Postoperative Diagnosis:Same   Procedure:   Transcatheter Aortic Valve Replacement - Left Trans-SubclavianApproach Edwards Sapien 3 UltraTHV (size 55m, model # [id number] serial # [id number]  Co-Surgeons:Clarence H. ORoxy Manns MD and MSherren Mocha MD  Anesthesiologist:David JLinna Caprice  MD  Echocardiographer:Mihai Croitoru, MD  Pre-operative Echo Findings: ? Severe aortic stenosis ? Normalleft ventricular systolic function  Post-operative Echo Findings: ? Noparavalvular leak ? Normalleft ventricular systolic function  _______________   Echo5/12/21: IMPRESSIONS  1. Left ventricular ejection fraction, by  estimation, is 70 to 75%. The left ventricle has hyperdynamic function. The left ventricle has no regional wall motion abnormalities. There is mild concentric left  ventricular hypertrophy. Left ventricular diastolic function could not be evaluated. There is the interventricular septum is flattened in systole, consistent with right ventricular pressure overload.  2. Right ventricular systolic function is mildly reduced. The right  ventricular size is moderately enlarged. There is severely elevated  pulmonary artery systolic pressure. The estimated right ventricular  systolic pressure is 16.1 mmHg.  3. The mitral valve is normal in structure. Mild to moderate mitral valve regurgitation.  4. Tricuspid valve regurgitation is moderate to severe.  5. The aortic valve has been repaired/replaced. Aortic valve  regurgitation is not visualized. There is a 26 mm Edwards Sapien  prosthetic (TAVR) valve present in the aortic position. Procedure Date:  01/18/2020. Echo findings are consistent with normal  structure and function of the aortic valve prosthesis. Aortic valve mean  gradient measures 9.3 mmHg. Aortic valve Vmax measures 2.24 m/s.  6. The inferior vena cava is dilated in size with <50% respiratory  variability, suggesting right atrial pressure of 15 mmHg.   Comparison(s): There is marked worsening of pulmonary artery hypertension and severity of tricuspid regurgitation since the intra-procedural study.   ______________________   MRI 01/22/20 IMPRESSION: 1. Numerous small acute infarcts in the right greater than left cerebral  hemispheres and left cerebellum compatible with a central embolic source. 2. 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   ________________   CT angio neck 01/23/20 IMPRESSION: Right ICA occlusion at the origin, precise age indeterminate. This may well be chronic.  Aortic atherosclerosis.  Advanced atherosclerotic disease at the left carotid bifurcation and proximal ICA. Severe stenosis of the proximal ICA with luminal diameter estimated at 1 mm. This would be an 80% or greater stenosis.  Stenotic disease at both vertebral artery origins. 70% stenosis on the right. 50% stenosis on the left.  Dense calcification at the basilar tip consistent with embolized plaque. This was not present on a CT scan 12/28/2019.  No acute intracranial large vessel occlusion. The embolized plaque at the basilar tip does result in stenosis of both P1 origins, but those vessels continue to show flow.     ASSESSMENT & PLAN:   Severe AS s/p TAVR:  CBH with a junctional escape: s/p St Jude PPM on 01/24/20 by Dr. Caryl Comes.  Acute CVA:  asa 325 mg daily and plavix 43m daily.   HTN:  Chronic anemia:  Chronic diastolic CHF:   CAD:s/p complex atherectomy and PCIoncomplicated by dissection/contained perforation treated with overlapping DES and a Papyrus covered stenton 12/24/19. No recurrent angina. Continue ASA and plavix(long term).  Chronic afib:underlying afib. Has previously refused oral anticoagulation and has a history of GI/GU bleeding.   Carotid artery disease: 80-99% RICA stenosis. Followed by Dr. BTrula Slade( possible TCAR candidate).CTA head and neck completed in hospital and showed right ICA occlusion at origin, severe stenosis of proximal left ICA, >80% stenosis. Bilateral VA origin stenosis, 70% on the right, 50% on the left. Dense calcification plaque at the basilar tip, new from previous CT, with bilateral stenosis of P1 origins.Dr. BTrula Sladesaw him  yesterday and recommends no revascularization for RICA but he will need left carotid intervention either endarterectomy or TCAR once his new baseline neurologic status is established.  Left arm weakness:     Medication Adjustments/Labs and Tests Ordered: Current medicines are reviewed at length with the patient today.  Concerns regarding  medicines are outlined above.  Medication changes, Labs and Tests ordered today are listed in the Patient Instructions below. There are no Patient Instructions on file for this visit.   Signed, Angelena Form, PA-C  02/05/2020 6:46 AM    [facility] [address], [place], [place]  [address] Phone: [phone]; Fax: [phone]

## 2020-02-08 ENCOUNTER — Telehealth: Payer: Self-pay | Admitting: Cardiology

## 2020-02-08 NOTE — Telephone Encounter (Signed)
Pt is still in a nursing facility and has not made significant cognitive improvement. He is being followed by the nursing home doctor. Patient's wife would like to be seen closer to the nursing facility in [facility]. She asked me to cancel all their upcoming appointments including pacemaker wound check and VVS appt with Dr. Myra Gianotti. I will cancel all these appointment and get him set up for follow up in [place] with his primary cardiologist, Dr. Jens Som. Will see if they can do echos out there and if not, we will just defer echo at this point.

## 2020-02-08 NOTE — Telephone Encounter (Signed)
Sorry to hear he's not doing well. Agree with plans as documented. thanks

## 2020-02-08 NOTE — Telephone Encounter (Signed)
Patient's wife, [place], is requesting to speak with Cline Crock in  [place] to a referral for a cardiologist closer to [facility]. She states the patient is no longer able to be transported far due to transportation at the facility. Please call.

## 2020-02-09 DIAGNOSIS — D508 Other iron deficiency anemias: Secondary | ICD-10-CM | POA: Diagnosis not present

## 2020-02-09 DIAGNOSIS — I5032 Chronic diastolic (congestive) heart failure: Secondary | ICD-10-CM | POA: Diagnosis not present

## 2020-02-09 DIAGNOSIS — I6389 Other cerebral infarction: Secondary | ICD-10-CM | POA: Diagnosis not present

## 2020-02-09 DIAGNOSIS — I480 Paroxysmal atrial fibrillation: Secondary | ICD-10-CM | POA: Diagnosis not present

## 2020-02-09 DIAGNOSIS — G4709 Other insomnia: Secondary | ICD-10-CM | POA: Diagnosis not present

## 2020-02-10 ENCOUNTER — Other Ambulatory Visit (HOSPITAL_COMMUNITY): Payer: Medicare Other

## 2020-02-10 ENCOUNTER — Ambulatory Visit: Payer: Medicare Other | Admitting: Physician Assistant

## 2020-02-10 ENCOUNTER — Ambulatory Visit: Payer: Medicare Other

## 2020-02-14 ENCOUNTER — Telehealth: Payer: Self-pay | Admitting: Cardiovascular Disease

## 2020-02-14 NOTE — Telephone Encounter (Signed)
Garrett Hughes with [employer] states she is calling to follow up in regards to status of request for CMN. A call may be returned to [name] at [phone] (ext: [phone]).

## 2020-02-16 NOTE — Telephone Encounter (Signed)
Attempted to call but received automated message that extension is invalid. No further paperwork has been received from [employer]. Will await another call from them for clarification.

## 2020-02-18 DIAGNOSIS — I10 Essential (primary) hypertension: Secondary | ICD-10-CM | POA: Diagnosis not present

## 2020-02-18 DIAGNOSIS — D508 Other iron deficiency anemias: Secondary | ICD-10-CM | POA: Diagnosis not present

## 2020-02-18 DIAGNOSIS — E1129 Type 2 diabetes mellitus with other diabetic kidney complication: Secondary | ICD-10-CM | POA: Diagnosis not present

## 2020-02-21 ENCOUNTER — Other Ambulatory Visit: Payer: Medicare Other

## 2020-02-21 ENCOUNTER — Ambulatory Visit: Payer: Medicare Other | Admitting: Surgery

## 2020-02-21 NOTE — Telephone Encounter (Signed)
Follow up  Garrett Hughes from [facility] called back. She said they sent the CMN form last June 1st. Per notes we did not receive any paperwork yet. She said she will re fax paper work today. She gave correct extension number [phone] ext. [phone]

## 2020-02-23 DIAGNOSIS — D6489 Other specified anemias: Secondary | ICD-10-CM | POA: Diagnosis not present

## 2020-02-23 DIAGNOSIS — B370 Candidal stomatitis: Secondary | ICD-10-CM | POA: Diagnosis not present

## 2020-02-23 DIAGNOSIS — L98419 Non-pressure chronic ulcer of buttock with unspecified severity: Secondary | ICD-10-CM | POA: Diagnosis not present

## 2020-02-24 ENCOUNTER — Ambulatory Visit: Payer: Medicare Other | Admitting: Physician Assistant

## 2020-02-25 DIAGNOSIS — S32039A Unspecified fracture of third lumbar vertebra, initial encounter for closed fracture: Secondary | ICD-10-CM | POA: Diagnosis not present

## 2020-02-25 DIAGNOSIS — I272 Pulmonary hypertension, unspecified: Secondary | ICD-10-CM | POA: Diagnosis not present

## 2020-02-25 DIAGNOSIS — S0003XA Contusion of scalp, initial encounter: Secondary | ICD-10-CM | POA: Diagnosis not present

## 2020-02-25 DIAGNOSIS — S79912A Unspecified injury of left hip, initial encounter: Secondary | ICD-10-CM | POA: Diagnosis not present

## 2020-02-25 DIAGNOSIS — I2510 Atherosclerotic heart disease of native coronary artery without angina pectoris: Secondary | ICD-10-CM | POA: Diagnosis not present

## 2020-02-25 DIAGNOSIS — R52 Pain, unspecified: Secondary | ICD-10-CM | POA: Diagnosis not present

## 2020-02-25 DIAGNOSIS — Z8673 Personal history of transient ischemic attack (TIA), and cerebral infarction without residual deficits: Secondary | ICD-10-CM | POA: Diagnosis not present

## 2020-02-25 DIAGNOSIS — I517 Cardiomegaly: Secondary | ICD-10-CM | POA: Diagnosis not present

## 2020-02-25 DIAGNOSIS — S0990XA Unspecified injury of head, initial encounter: Secondary | ICD-10-CM | POA: Diagnosis not present

## 2020-02-25 DIAGNOSIS — I6623 Occlusion and stenosis of bilateral posterior cerebral arteries: Secondary | ICD-10-CM | POA: Diagnosis not present

## 2020-02-25 DIAGNOSIS — I340 Nonrheumatic mitral (valve) insufficiency: Secondary | ICD-10-CM | POA: Diagnosis not present

## 2020-02-25 DIAGNOSIS — S199XXA Unspecified injury of neck, initial encounter: Secondary | ICD-10-CM | POA: Diagnosis not present

## 2020-02-25 DIAGNOSIS — S22080A Wedge compression fracture of T11-T12 vertebra, initial encounter for closed fracture: Secondary | ICD-10-CM | POA: Diagnosis not present

## 2020-02-25 DIAGNOSIS — M542 Cervicalgia: Secondary | ICD-10-CM | POA: Diagnosis not present

## 2020-02-25 DIAGNOSIS — Z7902 Long term (current) use of antithrombotics/antiplatelets: Secondary | ICD-10-CM | POA: Diagnosis not present

## 2020-02-25 DIAGNOSIS — I639 Cerebral infarction, unspecified: Secondary | ICD-10-CM | POA: Diagnosis not present

## 2020-02-25 DIAGNOSIS — J449 Chronic obstructive pulmonary disease, unspecified: Secondary | ICD-10-CM | POA: Diagnosis not present

## 2020-02-25 DIAGNOSIS — I482 Chronic atrial fibrillation, unspecified: Secondary | ICD-10-CM | POA: Diagnosis not present

## 2020-02-25 DIAGNOSIS — R4020 Unspecified coma: Secondary | ICD-10-CM | POA: Diagnosis not present

## 2020-02-25 DIAGNOSIS — E785 Hyperlipidemia, unspecified: Secondary | ICD-10-CM | POA: Diagnosis not present

## 2020-02-25 DIAGNOSIS — R269 Unspecified abnormalities of gait and mobility: Secondary | ICD-10-CM | POA: Diagnosis not present

## 2020-02-25 DIAGNOSIS — R58 Hemorrhage, not elsewhere classified: Secondary | ICD-10-CM | POA: Diagnosis not present

## 2020-02-25 DIAGNOSIS — S22089A Unspecified fracture of T11-T12 vertebra, initial encounter for closed fracture: Secondary | ICD-10-CM | POA: Diagnosis not present

## 2020-02-25 DIAGNOSIS — M4854XA Collapsed vertebra, not elsewhere classified, thoracic region, initial encounter for fracture: Secondary | ICD-10-CM | POA: Diagnosis not present

## 2020-02-25 DIAGNOSIS — R296 Repeated falls: Secondary | ICD-10-CM | POA: Diagnosis not present

## 2020-02-25 DIAGNOSIS — I110 Hypertensive heart disease with heart failure: Secondary | ICD-10-CM | POA: Diagnosis not present

## 2020-02-25 DIAGNOSIS — I480 Paroxysmal atrial fibrillation: Secondary | ICD-10-CM | POA: Diagnosis not present

## 2020-02-25 DIAGNOSIS — Z79899 Other long term (current) drug therapy: Secondary | ICD-10-CM | POA: Diagnosis not present

## 2020-02-25 DIAGNOSIS — I63531 Cerebral infarction due to unspecified occlusion or stenosis of right posterior cerebral artery: Secondary | ICD-10-CM | POA: Diagnosis not present

## 2020-02-25 DIAGNOSIS — Z7982 Long term (current) use of aspirin: Secondary | ICD-10-CM | POA: Diagnosis not present

## 2020-02-25 DIAGNOSIS — I6523 Occlusion and stenosis of bilateral carotid arteries: Secondary | ICD-10-CM | POA: Diagnosis not present

## 2020-02-25 DIAGNOSIS — S32030A Wedge compression fracture of third lumbar vertebra, initial encounter for closed fracture: Secondary | ICD-10-CM | POA: Diagnosis not present

## 2020-02-25 DIAGNOSIS — Z87891 Personal history of nicotine dependence: Secondary | ICD-10-CM | POA: Diagnosis not present

## 2020-02-25 DIAGNOSIS — I5030 Unspecified diastolic (congestive) heart failure: Secondary | ICD-10-CM | POA: Diagnosis not present

## 2020-02-25 DIAGNOSIS — L8995 Pressure ulcer of unspecified site, unstageable: Secondary | ICD-10-CM | POA: Diagnosis not present

## 2020-02-25 DIAGNOSIS — Z952 Presence of prosthetic heart valve: Secondary | ICD-10-CM | POA: Diagnosis not present

## 2020-02-25 DIAGNOSIS — S4992XA Unspecified injury of left shoulder and upper arm, initial encounter: Secondary | ICD-10-CM | POA: Diagnosis not present

## 2020-02-25 DIAGNOSIS — R519 Headache, unspecified: Secondary | ICD-10-CM | POA: Diagnosis not present

## 2020-02-25 DIAGNOSIS — Z794 Long term (current) use of insulin: Secondary | ICD-10-CM | POA: Diagnosis not present

## 2020-02-25 DIAGNOSIS — E119 Type 2 diabetes mellitus without complications: Secondary | ICD-10-CM | POA: Diagnosis not present

## 2020-02-25 DIAGNOSIS — M4856XA Collapsed vertebra, not elsewhere classified, lumbar region, initial encounter for fracture: Secondary | ICD-10-CM | POA: Diagnosis not present

## 2020-02-25 DIAGNOSIS — Z743 Need for continuous supervision: Secondary | ICD-10-CM | POA: Diagnosis not present

## 2020-02-25 DIAGNOSIS — R5381 Other malaise: Secondary | ICD-10-CM | POA: Diagnosis not present

## 2020-02-25 DIAGNOSIS — M25552 Pain in left hip: Secondary | ICD-10-CM | POA: Diagnosis not present

## 2020-02-25 DIAGNOSIS — Z950 Presence of cardiac pacemaker: Secondary | ICD-10-CM | POA: Diagnosis not present

## 2020-02-25 DIAGNOSIS — I4510 Unspecified right bundle-branch block: Secondary | ICD-10-CM | POA: Diagnosis not present

## 2020-02-26 DIAGNOSIS — I639 Cerebral infarction, unspecified: Secondary | ICD-10-CM | POA: Diagnosis not present

## 2020-02-26 DIAGNOSIS — I63531 Cerebral infarction due to unspecified occlusion or stenosis of right posterior cerebral artery: Secondary | ICD-10-CM | POA: Diagnosis not present

## 2020-02-26 DIAGNOSIS — I6523 Occlusion and stenosis of bilateral carotid arteries: Secondary | ICD-10-CM | POA: Diagnosis not present

## 2020-02-26 DIAGNOSIS — I6623 Occlusion and stenosis of bilateral posterior cerebral arteries: Secondary | ICD-10-CM | POA: Diagnosis not present

## 2020-02-26 DIAGNOSIS — I480 Paroxysmal atrial fibrillation: Secondary | ICD-10-CM | POA: Diagnosis not present

## 2020-02-26 DIAGNOSIS — S32039A Unspecified fracture of third lumbar vertebra, initial encounter for closed fracture: Secondary | ICD-10-CM | POA: Diagnosis not present

## 2020-02-26 DIAGNOSIS — S22089A Unspecified fracture of T11-T12 vertebra, initial encounter for closed fracture: Secondary | ICD-10-CM | POA: Diagnosis not present

## 2020-02-27 DIAGNOSIS — I5030 Unspecified diastolic (congestive) heart failure: Secondary | ICD-10-CM | POA: Diagnosis not present

## 2020-02-27 DIAGNOSIS — Z7982 Long term (current) use of aspirin: Secondary | ICD-10-CM | POA: Diagnosis not present

## 2020-02-27 DIAGNOSIS — Z7901 Long term (current) use of anticoagulants: Secondary | ICD-10-CM | POA: Diagnosis not present

## 2020-02-27 DIAGNOSIS — R279 Unspecified lack of coordination: Secondary | ICD-10-CM | POA: Diagnosis not present

## 2020-02-27 DIAGNOSIS — S22089A Unspecified fracture of T11-T12 vertebra, initial encounter for closed fracture: Secondary | ICD-10-CM | POA: Diagnosis not present

## 2020-02-27 DIAGNOSIS — I499 Cardiac arrhythmia, unspecified: Secondary | ICD-10-CM | POA: Diagnosis not present

## 2020-02-27 DIAGNOSIS — R6889 Other general symptoms and signs: Secondary | ICD-10-CM | POA: Diagnosis not present

## 2020-02-27 DIAGNOSIS — M47812 Spondylosis without myelopathy or radiculopathy, cervical region: Secondary | ICD-10-CM | POA: Diagnosis not present

## 2020-02-27 DIAGNOSIS — Z7902 Long term (current) use of antithrombotics/antiplatelets: Secondary | ICD-10-CM | POA: Diagnosis not present

## 2020-02-27 DIAGNOSIS — Z8673 Personal history of transient ischemic attack (TIA), and cerebral infarction without residual deficits: Secondary | ICD-10-CM | POA: Diagnosis not present

## 2020-02-27 DIAGNOSIS — I350 Nonrheumatic aortic (valve) stenosis: Secondary | ICD-10-CM | POA: Diagnosis not present

## 2020-02-27 DIAGNOSIS — I6389 Other cerebral infarction: Secondary | ICD-10-CM | POA: Diagnosis not present

## 2020-02-27 DIAGNOSIS — I6523 Occlusion and stenosis of bilateral carotid arteries: Secondary | ICD-10-CM | POA: Diagnosis not present

## 2020-02-27 DIAGNOSIS — M4856XA Collapsed vertebra, not elsewhere classified, lumbar region, initial encounter for fracture: Secondary | ICD-10-CM | POA: Diagnosis not present

## 2020-02-27 DIAGNOSIS — I69354 Hemiplegia and hemiparesis following cerebral infarction affecting left non-dominant side: Secondary | ICD-10-CM | POA: Diagnosis not present

## 2020-02-27 DIAGNOSIS — R52 Pain, unspecified: Secondary | ICD-10-CM | POA: Diagnosis not present

## 2020-02-27 DIAGNOSIS — R296 Repeated falls: Secondary | ICD-10-CM | POA: Diagnosis not present

## 2020-02-27 DIAGNOSIS — D696 Thrombocytopenia, unspecified: Secondary | ICD-10-CM | POA: Diagnosis not present

## 2020-02-27 DIAGNOSIS — E1129 Type 2 diabetes mellitus with other diabetic kidney complication: Secondary | ICD-10-CM | POA: Diagnosis not present

## 2020-02-27 DIAGNOSIS — R0902 Hypoxemia: Secondary | ICD-10-CM | POA: Diagnosis not present

## 2020-02-27 DIAGNOSIS — Y998 Other external cause status: Secondary | ICD-10-CM | POA: Diagnosis not present

## 2020-02-27 DIAGNOSIS — I639 Cerebral infarction, unspecified: Secondary | ICD-10-CM | POA: Diagnosis not present

## 2020-02-27 DIAGNOSIS — Z743 Need for continuous supervision: Secondary | ICD-10-CM | POA: Diagnosis not present

## 2020-02-27 DIAGNOSIS — S0003XA Contusion of scalp, initial encounter: Secondary | ICD-10-CM | POA: Diagnosis not present

## 2020-02-27 DIAGNOSIS — Z950 Presence of cardiac pacemaker: Secondary | ICD-10-CM | POA: Diagnosis not present

## 2020-02-27 DIAGNOSIS — I5032 Chronic diastolic (congestive) heart failure: Secondary | ICD-10-CM | POA: Diagnosis not present

## 2020-02-27 DIAGNOSIS — R404 Transient alteration of awareness: Secondary | ICD-10-CM | POA: Diagnosis not present

## 2020-02-27 DIAGNOSIS — I2510 Atherosclerotic heart disease of native coronary artery without angina pectoris: Secondary | ICD-10-CM | POA: Diagnosis not present

## 2020-02-27 DIAGNOSIS — I6521 Occlusion and stenosis of right carotid artery: Secondary | ICD-10-CM | POA: Diagnosis not present

## 2020-02-27 DIAGNOSIS — Z79899 Other long term (current) drug therapy: Secondary | ICD-10-CM | POA: Diagnosis not present

## 2020-02-27 DIAGNOSIS — Z043 Encounter for examination and observation following other accident: Secondary | ICD-10-CM | POA: Diagnosis not present

## 2020-02-27 DIAGNOSIS — S0990XA Unspecified injury of head, initial encounter: Secondary | ICD-10-CM | POA: Diagnosis not present

## 2020-02-27 DIAGNOSIS — Y92129 Unspecified place in nursing home as the place of occurrence of the external cause: Secondary | ICD-10-CM | POA: Diagnosis not present

## 2020-02-27 DIAGNOSIS — W19XXXA Unspecified fall, initial encounter: Secondary | ICD-10-CM | POA: Diagnosis not present

## 2020-02-27 DIAGNOSIS — I5033 Acute on chronic diastolic (congestive) heart failure: Secondary | ICD-10-CM | POA: Diagnosis not present

## 2020-02-27 DIAGNOSIS — I5189 Other ill-defined heart diseases: Secondary | ICD-10-CM | POA: Diagnosis not present

## 2020-02-27 DIAGNOSIS — L98419 Non-pressure chronic ulcer of buttock with unspecified severity: Secondary | ICD-10-CM | POA: Diagnosis not present

## 2020-02-27 DIAGNOSIS — S32039A Unspecified fracture of third lumbar vertebra, initial encounter for closed fracture: Secondary | ICD-10-CM | POA: Diagnosis not present

## 2020-02-27 DIAGNOSIS — R5381 Other malaise: Secondary | ICD-10-CM | POA: Diagnosis not present

## 2020-02-27 DIAGNOSIS — M2550 Pain in unspecified joint: Secondary | ICD-10-CM | POA: Diagnosis not present

## 2020-02-27 DIAGNOSIS — J984 Other disorders of lung: Secondary | ICD-10-CM | POA: Diagnosis not present

## 2020-02-27 DIAGNOSIS — I110 Hypertensive heart disease with heart failure: Secondary | ICD-10-CM | POA: Diagnosis not present

## 2020-02-27 DIAGNOSIS — E785 Hyperlipidemia, unspecified: Secondary | ICD-10-CM | POA: Diagnosis not present

## 2020-02-27 DIAGNOSIS — I6522 Occlusion and stenosis of left carotid artery: Secondary | ICD-10-CM | POA: Diagnosis not present

## 2020-02-27 DIAGNOSIS — I482 Chronic atrial fibrillation, unspecified: Secondary | ICD-10-CM | POA: Diagnosis not present

## 2020-02-27 DIAGNOSIS — I10 Essential (primary) hypertension: Secondary | ICD-10-CM | POA: Diagnosis not present

## 2020-02-27 DIAGNOSIS — I4510 Unspecified right bundle-branch block: Secondary | ICD-10-CM | POA: Diagnosis not present

## 2020-02-27 DIAGNOSIS — Z48812 Encounter for surgical aftercare following surgery on the circulatory system: Secondary | ICD-10-CM | POA: Diagnosis not present

## 2020-02-27 DIAGNOSIS — E119 Type 2 diabetes mellitus without complications: Secondary | ICD-10-CM | POA: Diagnosis not present

## 2020-02-27 DIAGNOSIS — I63342 Cerebral infarction due to thrombosis of left cerebellar artery: Secondary | ICD-10-CM | POA: Diagnosis not present

## 2020-02-27 DIAGNOSIS — Z952 Presence of prosthetic heart valve: Secondary | ICD-10-CM | POA: Diagnosis not present

## 2020-02-27 DIAGNOSIS — M4854XA Collapsed vertebra, not elsewhere classified, thoracic region, initial encounter for fracture: Secondary | ICD-10-CM | POA: Diagnosis not present

## 2020-02-27 DIAGNOSIS — Z7401 Bed confinement status: Secondary | ICD-10-CM | POA: Diagnosis not present

## 2020-02-27 DIAGNOSIS — I442 Atrioventricular block, complete: Secondary | ICD-10-CM | POA: Diagnosis not present

## 2020-02-27 DIAGNOSIS — J449 Chronic obstructive pulmonary disease, unspecified: Secondary | ICD-10-CM | POA: Diagnosis not present

## 2020-02-27 DIAGNOSIS — Z87891 Personal history of nicotine dependence: Secondary | ICD-10-CM | POA: Diagnosis not present

## 2020-02-27 DIAGNOSIS — M25552 Pain in left hip: Secondary | ICD-10-CM | POA: Diagnosis not present

## 2020-02-27 DIAGNOSIS — R269 Unspecified abnormalities of gait and mobility: Secondary | ICD-10-CM | POA: Diagnosis not present

## 2020-02-27 DIAGNOSIS — W01198A Fall on same level from slipping, tripping and stumbling with subsequent striking against other object, initial encounter: Secondary | ICD-10-CM | POA: Diagnosis not present

## 2020-02-27 DIAGNOSIS — S199XXA Unspecified injury of neck, initial encounter: Secondary | ICD-10-CM | POA: Diagnosis not present

## 2020-02-27 DIAGNOSIS — Z794 Long term (current) use of insulin: Secondary | ICD-10-CM | POA: Diagnosis not present

## 2020-02-28 DIAGNOSIS — E1129 Type 2 diabetes mellitus with other diabetic kidney complication: Secondary | ICD-10-CM | POA: Diagnosis not present

## 2020-02-28 DIAGNOSIS — R5381 Other malaise: Secondary | ICD-10-CM | POA: Diagnosis not present

## 2020-02-28 DIAGNOSIS — I5189 Other ill-defined heart diseases: Secondary | ICD-10-CM | POA: Diagnosis not present

## 2020-02-28 DIAGNOSIS — J984 Other disorders of lung: Secondary | ICD-10-CM | POA: Diagnosis not present

## 2020-02-28 DIAGNOSIS — Z950 Presence of cardiac pacemaker: Secondary | ICD-10-CM | POA: Diagnosis not present

## 2020-02-28 DIAGNOSIS — I6389 Other cerebral infarction: Secondary | ICD-10-CM | POA: Diagnosis not present

## 2020-02-28 NOTE — Telephone Encounter (Signed)
Follow up   Garrett Hughes calling back to follow up CMN form.

## 2020-02-28 NOTE — Telephone Encounter (Signed)
Spoke with Burna Mortimer at [facility]. form was faxed to [phone] on 02/21/20. Needs signed and returned for the time period pt wore O2 so that Medicare can pay for it.   Please fax back to [phone].   Burna Mortimer is aware Dr. Earmon Phoenix nurse is off today and Dr. Excell Seltzer is not in the office to sign.

## 2020-03-01 DIAGNOSIS — Z7901 Long term (current) use of anticoagulants: Secondary | ICD-10-CM | POA: Diagnosis not present

## 2020-03-01 DIAGNOSIS — M47812 Spondylosis without myelopathy or radiculopathy, cervical region: Secondary | ICD-10-CM | POA: Diagnosis not present

## 2020-03-01 DIAGNOSIS — S0990XA Unspecified injury of head, initial encounter: Secondary | ICD-10-CM | POA: Diagnosis not present

## 2020-03-01 DIAGNOSIS — R279 Unspecified lack of coordination: Secondary | ICD-10-CM | POA: Diagnosis not present

## 2020-03-01 DIAGNOSIS — S199XXA Unspecified injury of neck, initial encounter: Secondary | ICD-10-CM | POA: Diagnosis not present

## 2020-03-01 DIAGNOSIS — W19XXXA Unspecified fall, initial encounter: Secondary | ICD-10-CM | POA: Diagnosis not present

## 2020-03-01 DIAGNOSIS — Z043 Encounter for examination and observation following other accident: Secondary | ICD-10-CM | POA: Diagnosis not present

## 2020-03-01 DIAGNOSIS — Y998 Other external cause status: Secondary | ICD-10-CM | POA: Diagnosis not present

## 2020-03-01 DIAGNOSIS — L98419 Non-pressure chronic ulcer of buttock with unspecified severity: Secondary | ICD-10-CM | POA: Diagnosis not present

## 2020-03-01 DIAGNOSIS — Y92129 Unspecified place in nursing home as the place of occurrence of the external cause: Secondary | ICD-10-CM | POA: Diagnosis not present

## 2020-03-01 DIAGNOSIS — W01198A Fall on same level from slipping, tripping and stumbling with subsequent striking against other object, initial encounter: Secondary | ICD-10-CM | POA: Diagnosis not present

## 2020-03-01 NOTE — Telephone Encounter (Signed)
Papers signed by Carlean Jews and faxed.

## 2020-03-08 DIAGNOSIS — L98419 Non-pressure chronic ulcer of buttock with unspecified severity: Secondary | ICD-10-CM | POA: Diagnosis not present

## 2020-03-09 ENCOUNTER — Ambulatory Visit: Payer: Medicare Other

## 2020-03-15 DIAGNOSIS — L98419 Non-pressure chronic ulcer of buttock with unspecified severity: Secondary | ICD-10-CM | POA: Diagnosis not present

## 2020-03-15 NOTE — Progress Notes (Signed)
[name]                                       Cardiology Office Note    Date:  03/16/2020   ID:  Garrett Hughes, DOB [birth year], MRN [id number]  PCP:  Emeterio Reeve, DO  Cardiologist: Kirk Ruths, MD / Dr. Burt Knack & Dr. Roxy Manns (TAVR)  CC: 1 month s/p TAVR   History of Present Illness:  Garrett Hughes is a 79 y.o. male with a history of CAD s/p CABG 1997 and recent PCI of RCA (12/24/19), RBBB, chronic atrial fibrillation (pt declines anticoagulation),high grade carotid artery disease,HTN, DMT2, HLD, chronic anemia/thrombocytopenia, GERD, Barrett's esophagus, h/o GIB and urinary tract bleeding and severe ASs/p TAVR (01/18/20) who presents to clinic for follow up.   Patient's cardiac history dates back to 1997 when he first presented with coronary artery disease. He underwent coronary artery bypass grafting x3 at [facility] in [place]. He has been followed for the last several years by Dr. Stanford Breed with history of aortic stenosis, coronary artery disease, and persistent atrial fibrillation. He was anticoagulated using warfarin for a period of time but this was stopped because of problems with GI bleeding and hematuria and difficulty maintaining therapeutic levels.  Patient has known history of aortic stenosis that has progressed on serial follow-up echocardiograms. He also has a long history of exertional shortness of breath that has slowly progressed and become quite problematic over the last few months. Recent echocardiogram performed October 19, 2019 revealed normal left ventricular systolic function with severe aortic stenosis, moderate aortic insufficiency and moderate mitral regurgitation. He was seen in follow-up by Dr. Stanford Breed and referred to the multidisciplinary heart valve clinic. Diagnostic cardiac catheterization performed by Dr. Burt Knack on November 26, 2019 revealed severe multivessel coronary artery  disease with chronic occlusion of the left coronary artery and severe high-grade stenosis of the right coronary artery. There was continued patency of left internal mammary artery to the left anterior descending coronary artery and greater saphenous vein graft to the obtuse marginal branch. Right internal mammary artery graft previously placed to the right coronary artery was atretic. The patient's case was reviewed by multidisciplinary team of specialists and the patient subsequently was brought back to the Cath Lab on December 24, 2019 for PCI and stenting of the right coronary artery. This procedure was complicated by coronary perforation requiring placement of a covered stent.Following the procedure the patient had 2 days of chest pain but serial echocardiograms revealed no significant pericardial effusion. The patient was discharged from the hospital but readmitted December 28, 2019 following a syncopal episode that was felt likely to be related to his aortic stenosis, atrial fibrillation, and dehydration. Medications were adjusted and a ZIOpatch monitor placed.   He underwent successful TAVR with a70m Edwards Sapien 3 THV via the left subclavianapproach on 01/18/20. Operative course complicated by balloon rupture during valve deployment and axillary artery damage, this was successfully repaired and there was no significant vascular injury by flouroscopy.Post operative echoshowed EF 70%, normally functioning TAVR with a mean gradient of 9.3 mm Hg and no PVL. There was mod-severe TR and pulm HTN. He then developed profound bradycardia with a junctional rhythm and required PPM on 01/24/20. Additionally, he developed left arm weakness and AMS post operatively. Stat head CT was negative. Follow up MRI showed numerous small  acute infarcts in the right greater than left cerebral hemispheres and left cerebellum compatible with a central embolic source. CTA and carotid dopplers also showed total occlusion on  RICA. Etiology was felt to be related to multiple, large vessel occlusion/stenosis in the setting of procedure/low BP vs TAVR procedure related vs. chronic afib not on AC. Neurology recommended asa 325 mg daily and plavix 88m daily. VVS recommended no revascularization for RICA but that he would need left carotid intervention either endarterectomy or TCAR once his new baseline neurologic status is established. .Additionally, he required transfusion due to worsening anemia. Given the patient's inability to care for himself he was discharged to a SNF  Today he presents to clinic for follow up.  Here with his wife today.  [facility]  He cannot take care of his own ADLs and is basically wheelchair bound.  He is now able to feed himself and swallow pured foods.  He still has some left-sided weakness.  His cognition is not back to baseline.  No lower extremity edema, orthopnea or PND.  No chest pain.  No dizziness or syncope.  Past Medical History:  Diagnosis Date  . Anemia   . Arthritis   . Atherosclerosis of native coronary artery of native heart without angina pectoris 01/05/2016  . Benign essential hypertension 01/05/2016  . Carotid artery disease ([place]   . Cataract, nuclear, right 05/06/2014  . Chronic atrial fibrillation ([facility] 12/20/2015  . Chronic diarrhea   . COPD (chronic obstructive pulmonary disease) (HCC)    on nocturnal O2  . Current use of long term anticoagulation 12/20/2015  . Diabetic kidney disease ([facility] 01/05/2016  . GERD (gastroesophageal reflux disease)   . GI bleed   . History of Barrett's esophagus 12/20/2015  . History of blood in urine   . History of kidney stones   . 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 Hyperlipemia 10/26/2015  . Low back pain 05/02/2016  . S/P TAVR (transcatheter aortic valve replacement) 01/18/2020   s/p successful TAVR w/ a 26 mm Edwards S3U via the left subclavian approach  (failed Shockwave TF) with Dr. CBurt Knackand Dr. ORoxy Manns  . Severe aortic stenosis   . Thrombocytopenia ([place]   . Type 2 diabetes mellitus with kidney complication, without long-term current use of insulin ([place] 07/03/2016    Past Surgical History:  Procedure Laterality Date  . AORTOGRAM  01/18/2020   Procedure: Aortogram;  Surgeon: CSherren Mocha MD;  [place] [facility]  Service: Open Heart Surgery;;  . BACK SURGERY  2004  . [place] . CORONARY ARTERY BYPASS GRAFT  1997  . CORONARY ATHERECTOMY N/A 12/24/2019   Procedure: CORONARY ATHERECTOMY;  Surgeon: CSherren Mocha MD;  [facility];  Service: Cardiovascular;  Laterality: N/A;  . INTRAOPERATIVE TRANSESOPHAGEAL ECHOCARDIOGRAM  01/18/2020   Procedure: Intraoperative Transesophageal Echocardiogram;  Surgeon: CSherren Mocha MD;  [place] [facility]  Service: Open Heart Surgery;;  . INTRAOPERATIVE TRANSTHORACIC ECHOCARDIOGRAM N/A 01/18/2020   Procedure: Intraoperative Transthoracic Echocardiogram;  Surgeon: CSherren Mocha MD;  [place]  Service: Open Heart Surgery;  Laterality: N/A;  . LOWER EXTREMITY ANGIOGRAM  01/18/2020   Procedure: Lower Extremity Angiogram;  Surgeon: CSherren Mocha MD;  [facility]  Service: Open Heart Surgery;;  Shockwave  . PACEMAKER IMPLANT N/A 01/24/2020   Procedure: PACEMAKER IMPLANT;  Surgeon: KDeboraha Sprang MD;  [facility];  Service: Cardiovascular;  Laterality: N/A;  . RIGHT HEART CATH AND CORONARY/GRAFT ANGIOGRAPHY N/A 11/26/2019   Procedure: RIGHT HEART CATH AND CORONARY/GRAFT ANGIOGRAPHY;  Surgeon: Sherren Mocha, MD;  Location: [place] CV LAB;  Service: Cardiovascular;  Laterality: N/A;  . SHOULDER SURGERY  2005  . TRANSCATHETER AORTIC VALVE REPLACEMENT, TRANSFEMORAL Left 01/18/2020   Procedure: Transcatheter Aortic Valve Replacement (subclavian approach);  Surgeon: Sherren Mocha, MD;  Location: [place];  Service: Open Heart Surgery;  Laterality:  Left;    Current Medications: Outpatient Medications Prior to Visit  Medication Sig Dispense Refill  . ACCU-CHEK SOFTCLIX LANCETS lancets Use as instructed to check blood sugar up to qid. Dx Code E11.22. 100 each 12  . Alcohol Swabs PADS Use as instructed to check blood sugar up to qid. Dx code E11.22 100 each 1  . AMBULATORY NON FORMULARY MEDICATION Medication Name: accu-chek aviva plus meter, accu-check aviva plus test strips & accu-chek softclix lancets to test twice a day. Dx: E11.29 1 vial PRN  . aspirin EC 325 MG EC tablet Take 1 tablet (325 mg total) by mouth daily. 30 tablet 0  . Blood Glucose Calibration (ACCU-CHEK AVIVA) SOLN Use as instructed per manufacturer directions. Dx code E11.22 1 each 0  . Blood Glucose Monitoring Suppl (ACCU-CHEK AVIVA PLUS) w/Device KIT Use as instructed to check blood sugar up to qid. Dx code E11.22 1 kit 0  . clopidogrel (PLAVIX) 75 MG tablet Take 75 mg by mouth daily.    . ferrous sulfate 325 (65 FE) MG EC tablet Take 1 tablet (325 mg total) by mouth daily with breakfast. 90 tablet 3  . furosemide (LASIX) 40 MG tablet Take 0.5 tablets (20 mg total) by mouth daily.    Marland Kitchen glipiZIDE (GLUCOTROL) 5 MG tablet TAKE 1 TABLET BY MOUTH  TWICE DAILY BEFORE MEALS 180 tablet 0  . glucose blood (ACCU-CHEK AVIVA PLUS) test strip Use as instructed to check blood sugar up to qid. Dx Code E11.22. Disp generic per pt preference / insurance coverage 100 each 99  . Lancets (ONETOUCH ULTRASOFT) lancets Use as instructed to check blood sugar up to qid. Dx: E11.22 100 each 99  . melatonin 5 MG TABS Take 5 mg by mouth at bedtime.    . metFORMIN (GLUCOPHAGE) 1000 MG tablet TAKE 1 TABLET BY MOUTH  TWICE DAILY WITH A MEAL 180 tablet 0  . metoprolol succinate (TOPROL-XL) 25 MG 24 hr tablet Take 1 tablet (25 mg total) by mouth daily. 90 tablet 0  . nitroGLYCERIN (NITROSTAT) 0.4 MG SL tablet Place 1 tablet (0.4 mg total) under the tongue every 5 (five) minutes as needed for chest pain.  30 tablet 1  . pantoprazole (PROTONIX) 20 MG tablet TAKE 1 TABLET BY MOUTH  DAILY 90 tablet 0  . pravastatin (PRAVACHOL) 40 MG tablet Take 1 tablet (40 mg total) by mouth daily. 90 tablet 0  . doxazosin (CARDURA) 8 MG tablet TAKE 1 TABLET BY MOUTH  DAILY 90 tablet 0  . lisinopril (ZESTRIL) 5 MG tablet TAKE 1 TABLET BY MOUTH  DAILY (Patient not taking: Reported on 01/10/2020) 90 tablet 0  . Multiple Vitamin (MULTIVITAMIN WITH MINERALS) TABS tablet Take 1 tablet by mouth daily.     No facility-administered medications prior to visit.     Allergies:   Atorvastatin, Cholestyramine, and Pravastatin   Social History   Socioeconomic History  . Marital status: Married    Spouse name: Edd Fabian  . Number of children: 2  .  Years of education: 22  . Highest education level: 11th grade  Occupational History  . Occupation: truck Geophysicist/field seismologist    Comment: retired  Tobacco Use  . Smoking status: Former Smoker    Quit date: 09/10/1987    Years since quitting: 32.5  . Smokeless tobacco: Never Used  Vaping Use  . Vaping Use: Never used  Substance and Sexual Activity  . Alcohol use: No  . Drug use: No  . Sexual activity: Not Currently  Other Topics Concern  . 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 Keeps grandsons and granddaughter   Social Determinants of Health   Financial Resource Strain:   . Difficulty of Paying Living Expenses:   Food Insecurity: No Food Insecurity  . Worried About Charity fundraiser in the Last Year: Never true  . Ran Out of Food in the Last Year: Never true  Transportation Needs: No Transportation Needs  . Lack of Transportation (Medical): No  . Lack of Transportation (Non-Medical): No  Physical Activity:   . Days of Exercise per Week:   . Minutes of Exercise per Session:   Stress:   . Feeling of Stress :   Social Connections:   . Frequency of Communication with Friends and Family:    . Frequency of Social Gatherings with Friends and Family:   . Attends Religious Services:   . Active Member of Clubs or Organizations:   . Attends Archivist Meetings:   Marland Kitchen Marital Status:      Family History:  The patient's family history includes CAD in his father.     ROS:   Please see the history of present illness.    ROS All other systems reviewed and are negative.   PHYSICAL EXAM:   VS:  BP 112/60 (BP Location: Right Arm, Patient Position: Sitting, Cuff Size: Normal)   Pulse (!) 59   Ht _0  (1.626 m)   Wt 122 lb (55.3 kg)   SpO2 95%   BMI 20.94 kg/m    GEN: Well nourished, well developed, in no acute distress, sitting in a wheelchair.  HEENT: normal Neck: no JVD or masses Cardiac: irreg irreg. Soft flow murmur. No rubs, or gallops,no edema  Respiratory:  clear to auscultation bilaterally, normal work of breathing GI: soft, nontender, nondistended, + BS MS: no deformity or atrophy Skin: warm and dry, no rash. Extensive ecchymosis and lacerations on arms  Neuro:  Alert and Oriented x 3, Strength and sensation are intact Psych: euthymic mood, full affect   Wt Readings from Last 3 Encounters:  03/16/20 122 lb (55.3 kg)  01/28/20 121 lb 14.6 oz (55.3 kg)  01/13/20 147 lb 3.2 oz (66.8 kg)      Studies/Labs Reviewed:   EKG:  EKG is NOT ordered today.    Recent Labs: 12/28/2019: TSH 3.429 01/13/2020: ALT 16; B Natriuretic Peptide 416.5 01/20/2020: Magnesium 1.9 01/26/2020: BUN 23; Creatinine, Ser 1.17; Hemoglobin 10.8; Platelets 226; Potassium 4.0; Sodium 141   Lipid Panel    Component Value Date/Time   CHOL 141 01/23/2020 0536   TRIG 83 01/23/2020 0536   HDL 51 01/23/2020 0536   CHOLHDL 2.8 01/23/2020 0536   VLDL 17 01/23/2020 0536   LDLCALC 73 01/23/2020 0536   LDLCALC 67 09/21/2019 0922    Additional studies/ records that were reviewed today include:  TAVR OPERATIVE NOTE   Date  of Procedure:01/18/2020  Preoperative  Diagnosis:Severe Aortic Stenosis   Postoperative Diagnosis:Same   Procedure:   Transcatheter Aortic Valve Replacement - Left Trans-SubclavianApproach Edwards Sapien 3 UltraTHV (size 18m, model # [id number] serial # [id number]  Co-Surgeons:Clarence H. ORoxy Manns MD and MSherren Mocha MD  Anesthesiologist:David JLinna Caprice MD  Echocardiographer:Mihai Croitoru, MD  Pre-operative Echo Findings: ? Severe aortic stenosis ? Normalleft ventricular systolic function  Post-operative Echo Findings: ? Noparavalvular leak ? Normalleft ventricular systolic function  _______________   Echo5/12/21: IMPRESSIONS  1. Left ventricular ejection fraction, by estimation, is 70 to 75%. The left ventricle has hyperdynamic function. The left ventricle has no regional wall motion abnormalities. There is mild concentric left  ventricular hypertrophy. Left ventricular diastolic function could not be evaluated. There is the interventricular septum is flattened in systole, consistent with right ventricular pressure overload.  2. Right ventricular systolic function is mildly reduced. The right  ventricular size is moderately enlarged. There is severely elevated  pulmonary artery systolic pressure. The estimated right ventricular  systolic pressure is 843.1mmHg.  3. The mitral valve is normal in structure. Mild to moderate mitral valve regurgitation.  4. Tricuspid valve regurgitation is moderate to severe.  5. The aortic valve has been repaired/replaced. Aortic valve  regurgitation is not visualized. There is a 26 mm Edwards Sapien  prosthetic (TAVR) valve present in the aortic position. Procedure Date:  01/18/2020. Echo findings are consistent with normal  structure and function of the aortic valve prosthesis. Aortic valve mean  gradient measures 9.3 mmHg. Aortic valve Vmax measures 2.24 m/s.  6. The  inferior vena cava is dilated in size with <50% respiratory  variability, suggesting right atrial pressure of 15 mmHg.   Comparison(s): There is marked worsening of pulmonary artery hypertension and severity of tricuspid regurgitation since the intra-procedural study.   ______________________   MRI 01/22/20 IMPRESSION: 1. Numerous small acute infarcts in the right greater than left cerebral hemispheres and left cerebellum compatible with a central embolic source. 2. 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   ________________   CT angio neck 01/23/20 IMPRESSION: Right ICA occlusion at the origin, precise age indeterminate. This may well be chronic.  Aortic atherosclerosis.  Advanced atherosclerotic disease at the left carotid bifurcation and proximal ICA. Severe stenosis of the proximal ICA with luminal diameter estimated at 1 mm. This would be an 80% or greater stenosis.  Stenotic disease at both vertebral artery origins. 70% stenosis on the right. 50% stenosis on the left.  Dense calcification at the basilar tip consistent with embolized plaque. This was not present on a CT scan 12/28/2019.  No acute intracranial large vessel occlusion. The embolized plaque at the basilar tip does result in stenosis of both P1 origins, but those vessels continue to show flow.  __________________  Echo 03/16/2020 IMPRESSIONS  1. S/P TAVR with mean gradient 10 mmHg, AVA 1.6 cm2 and no AI.  2. Left ventricular ejection fraction, by estimation, is 55 to 60%. The  left ventricle has normal function. The left ventricle has no regional  wall motion abnormalities. Left ventricular diastolic parameters are  indeterminate.  3. Right ventricular systolic function is normal. The right ventricular  size is mildly enlarged.  4. Left atrial size was moderately dilated.  5. Right atrial size was mildly dilated.  6. The mitral valve is normal in structure. Mild  mitral valve  regurgitation. No evidence of mitral stenosis.  7. Tricuspid valve regurgitation is mild to moderate.  8. The aortic valve has been  repaired/replaced. Aortic valve  regurgitation is not visualized. No aortic stenosis is present.  9. Aortic dilatation noted. There is mild dilatation of the ascending  aorta measuring 39 mm.    ASSESSMENT & PLAN:   Severe AS s/p TAVR: Echo today shows EF 55%, normally functioning TAVR with a mean gradient of 10 mmHg and no PVL. He has NYHA class I symptoms but he is basically wheelchair bound. He has been on Aspirin 340m daily and plavix 75 mg daily as directed by neurology. DAPT will need to be continued long term, but will decrease Asa 3270mdaily to 81 mg daily due to excessive bruising and bleeding.   [place] a junctional escape: s/p St Jude PPM on 01/24/20 by Dr. KlCaryl ComesWound check today.   Hx of CVA: still with considerable cognitive deficits and left sided weakness. He has follow up with Dr. SeLeonie Man/15. Continue DAPT as above but decrease aspirin to 8156maily.   HTN: Bp well controlled today -no changes made.   Chronic diastolic CHF: appears euvolemic. Continue lasix 52m21mily.   CAD:s/p complex atherectomy and PCIoncomplicated by dissection/contained perforation treated with overlapping DES and a Papyrus covered stenton 12/24/19. No recurrent angina. Continue ASA and plavix(long term).  Chronic afib:underlying afib. Has previously refused oral anticoagulation and has a history of GI/GU bleeding.   Carotid artery disease: 80-99% RICA stenosis. Followed by Dr. BrabTrula Sladeossible TCAR candidate).CTA head and neck completed in hospital and showed right ICA occlusion at origin, severe stenosis of proximal left ICA, >80% stenosis. Bilateral VA origin stenosis, 70% on the right, 50% on the left. Dense calcification plaque at the basilar tip, new from previous CT, with bilateral stenosis of P1 origins.Dr. BrabTrula Sladeommended  no revascularization for RICA but he will need left carotid intervention either endarterectomy or TCAR once his new baseline neurologic status is established. Patient and wife are not interested in pursuing any more invasive procedures or surgeries at this point for fear of making him worse.    Medication Adjustments/Labs and Tests Ordered: Current medicines are reviewed at length with the patient today.  Concerns regarding medicines are outlined above.  Medication changes, Labs and Tests ordered today are listed in the Patient Instructions below. There are no Patient Instructions on file for this visit.   Signed, KathAngelena Form-C  03/16/2020 1:45 PM    [facility] [address] [place]  [id number]: [phone]: [phone]

## 2020-03-16 ENCOUNTER — Ambulatory Visit (INDEPENDENT_AMBULATORY_CARE_PROVIDER_SITE_OTHER): Payer: Medicare Other | Admitting: Physician Assistant

## 2020-03-16 ENCOUNTER — Other Ambulatory Visit: Payer: Self-pay

## 2020-03-16 ENCOUNTER — Encounter: Payer: Self-pay | Admitting: Physician Assistant

## 2020-03-16 ENCOUNTER — Ambulatory Visit (INDEPENDENT_AMBULATORY_CARE_PROVIDER_SITE_OTHER): Payer: Medicare Other | Admitting: Emergency Medicine

## 2020-03-16 ENCOUNTER — Ambulatory Visit (HOSPITAL_COMMUNITY): Payer: No Typology Code available for payment source | Attending: Cardiology

## 2020-03-16 VITALS — BP 112/60 | HR 59 | Ht 64.0 in | Wt 122.0 lb

## 2020-03-16 DIAGNOSIS — I10 Essential (primary) hypertension: Secondary | ICD-10-CM | POA: Diagnosis not present

## 2020-03-16 DIAGNOSIS — Z952 Presence of prosthetic heart valve: Secondary | ICD-10-CM

## 2020-03-16 DIAGNOSIS — Z950 Presence of cardiac pacemaker: Secondary | ICD-10-CM

## 2020-03-16 DIAGNOSIS — Z8673 Personal history of transient ischemic attack (TIA), and cerebral infarction without residual deficits: Secondary | ICD-10-CM

## 2020-03-16 DIAGNOSIS — I482 Chronic atrial fibrillation, unspecified: Secondary | ICD-10-CM

## 2020-03-16 DIAGNOSIS — I5032 Chronic diastolic (congestive) heart failure: Secondary | ICD-10-CM

## 2020-03-16 DIAGNOSIS — I6521 Occlusion and stenosis of right carotid artery: Secondary | ICD-10-CM

## 2020-03-16 DIAGNOSIS — I2510 Atherosclerotic heart disease of native coronary artery without angina pectoris: Secondary | ICD-10-CM

## 2020-03-16 DIAGNOSIS — I442 Atrioventricular block, complete: Secondary | ICD-10-CM | POA: Diagnosis not present

## 2020-03-16 LAB — ECHOCARDIOGRAM COMPLETE
Height: 64 in
Weight: 1952 oz

## 2020-03-16 MED ORDER — AMOXICILLIN 500 MG PO CAPS
ORAL_CAPSULE | ORAL | 3 refills | Status: DC
Start: 2020-03-16 — End: 2020-11-02

## 2020-03-16 MED ORDER — ASPIRIN EC 81 MG PO TBEC
81.0000 mg | DELAYED_RELEASE_TABLET | Freq: Every day | ORAL | 3 refills | Status: DC
Start: 2020-03-16 — End: 2022-05-30

## 2020-03-16 NOTE — Patient Instructions (Signed)
Medication Instructions:  1) DECREASE Aspirin to 81mg  once daily 2) If you are having dental work, please take Amoxicillin (4 tablets to equal 2 grams) one hour prior to your dental work.  *If you need a refill on your cardiac medications before your next appointment, please call your pharmacy*   Lab Work: None If you have labs (blood work) drawn today and your tests are completely normal, you will receive your results only by: MyChart Message (if you have MyChart) OR . A paper copy in the mail If you have any lab test that is abnormal or we need to change your treatment, we will call you to review the results.   Testing/Procedures: None   Follow-Up: At [facility], you and your health needs are our priority.  As part of our continuing mission to provide you with exceptional heart care, we have created designated Provider Care Teams.  These Care Teams include your primary Cardiologist (physician) and Advanced Practice Providers (APPs -  Physician Assistants and Nurse Practitioners) who all work together to provide you with the care you need, when you need it.  We recommend signing up for the patient portal called "MyChart".  Sign up information is provided on this After Visit Summary.  MyChart is used to connect with patients for Virtual Visits (Telemedicine).  Patients are able to view lab/test results, encounter notes, upcoming appointments, etc.  Non-urgent messages can be sent to your provider as well.   To learn more about what you can do with MyChart, go to [facility].    Your next appointment:   4 month(s)  The format for your next appointment:   In Person  Provider:   You may see [email or web address], MD or the following Advanced Practice Provider on your designated Care Team:    Olga Millers, FNP    Other Instructions

## 2020-03-16 NOTE — Progress Notes (Signed)
Wound check appointment. Steri-strips removed prior to appointment. Wound without redness or edema. Incision edges approximated, wound well healed, there is a <1cm scab present on distal end of scar.  Pacemaker check in clinic completed with industry. Normal device function. Thresholds, and impedances consistent with previous measurements. Output adjusted to 3.5v @ 0.58ms per Dr Graciela Husbands.   No high ventricular rates noted. Device programmed at appropriate safety margins. Histogram distribution appropriate for patient activity level. Device programmed to optimize intrinsic conduction. Estimated longevity 4.5 years. Patient enrolled in remote follow-up, education provided to spouse for nursing facility to send remote transmission upon return, next scheduled transmission 04/27/20.  ROV with Dr. Graciela Husbands on 05/11/2020.

## 2020-03-17 LAB — CUP PACEART INCLINIC DEVICE CHECK
Battery Remaining Longevity: 80 mo
Battery Voltage: 2.99 V
Brady Statistic RV Percent Paced: 99.85 %
Date Time Interrogation Session: 20210708133459
Implantable Lead Implant Date: 20210517
Implantable Lead Location: 753860
Implantable Pulse Generator Implant Date: 20210517
Lead Channel Impedance Value: 512.5 Ohm
Lead Channel Pacing Threshold Amplitude: 0.75 V
Lead Channel Pacing Threshold Amplitude: 0.75 V
Lead Channel Pacing Threshold Pulse Width: 0.5 ms
Lead Channel Pacing Threshold Pulse Width: 0.5 ms
Lead Channel Sensing Intrinsic Amplitude: 12 mV
Lead Channel Setting Pacing Amplitude: 3.5 V
Lead Channel Setting Pacing Pulse Width: 0.5 ms
Lead Channel Setting Sensing Sensitivity: 4 mV
Pulse Gen Model: 1272
Pulse Gen Serial Number: 3805346

## 2020-03-20 ENCOUNTER — Telehealth: Payer: Self-pay | Admitting: Internal Medicine

## 2020-03-20 DIAGNOSIS — Z950 Presence of cardiac pacemaker: Secondary | ICD-10-CM | POA: Insufficient documentation

## 2020-03-20 DIAGNOSIS — R601 Generalized edema: Secondary | ICD-10-CM | POA: Insufficient documentation

## 2020-03-20 DIAGNOSIS — G8929 Other chronic pain: Secondary | ICD-10-CM | POA: Insufficient documentation

## 2020-03-20 DIAGNOSIS — Z23 Encounter for immunization: Secondary | ICD-10-CM | POA: Insufficient documentation

## 2020-03-20 DIAGNOSIS — I69319 Unspecified symptoms and signs involving cognitive functions following cerebral infarction: Secondary | ICD-10-CM | POA: Insufficient documentation

## 2020-03-20 DIAGNOSIS — M1990 Unspecified osteoarthritis, unspecified site: Secondary | ICD-10-CM | POA: Insufficient documentation

## 2020-03-20 DIAGNOSIS — Z9181 History of falling: Secondary | ICD-10-CM | POA: Insufficient documentation

## 2020-03-20 DIAGNOSIS — I6523 Occlusion and stenosis of bilateral carotid arteries: Secondary | ICD-10-CM | POA: Insufficient documentation

## 2020-03-20 DIAGNOSIS — I272 Pulmonary hypertension, unspecified: Secondary | ICD-10-CM | POA: Insufficient documentation

## 2020-03-20 DIAGNOSIS — M6281 Muscle weakness (generalized): Secondary | ICD-10-CM | POA: Insufficient documentation

## 2020-03-20 DIAGNOSIS — Z951 Presence of aortocoronary bypass graft: Secondary | ICD-10-CM | POA: Insufficient documentation

## 2020-03-20 DIAGNOSIS — K5900 Constipation, unspecified: Secondary | ICD-10-CM | POA: Insufficient documentation

## 2020-03-20 DIAGNOSIS — N390 Urinary tract infection, site not specified: Secondary | ICD-10-CM | POA: Insufficient documentation

## 2020-03-20 DIAGNOSIS — R0989 Other specified symptoms and signs involving the circulatory and respiratory systems: Secondary | ICD-10-CM | POA: Insufficient documentation

## 2020-03-20 DIAGNOSIS — F064 Anxiety disorder due to known physiological condition: Secondary | ICD-10-CM | POA: Insufficient documentation

## 2020-03-20 DIAGNOSIS — R1312 Dysphagia, oropharyngeal phase: Secondary | ICD-10-CM | POA: Insufficient documentation

## 2020-03-20 DIAGNOSIS — F05 Delirium due to known physiological condition: Secondary | ICD-10-CM | POA: Insufficient documentation

## 2020-03-20 DIAGNOSIS — R509 Fever, unspecified: Secondary | ICD-10-CM | POA: Insufficient documentation

## 2020-03-20 DIAGNOSIS — Z515 Encounter for palliative care: Secondary | ICD-10-CM | POA: Insufficient documentation

## 2020-03-20 DIAGNOSIS — E639 Nutritional deficiency, unspecified: Secondary | ICD-10-CM | POA: Insufficient documentation

## 2020-03-20 DIAGNOSIS — I69354 Hemiplegia and hemiparesis following cerebral infarction affecting left non-dominant side: Secondary | ICD-10-CM | POA: Insufficient documentation

## 2020-03-20 DIAGNOSIS — R2689 Other abnormalities of gait and mobility: Secondary | ICD-10-CM | POA: Insufficient documentation

## 2020-03-20 NOTE — Telephone Encounter (Signed)
Spoke with pt daughter and spouse.  Advised that the monitor the patient has did transmit on Thursday 7/8, it seems she thought the patient has the wrong box next to his bed.  Described the monitor for her, she will send a test transmission when she next visits him.

## 2020-03-20 NOTE — Telephone Encounter (Signed)
New Message:     Pt 's Pacemaker Box was lost at the facility he lives. She wants to know how can he get a new box please?

## 2020-03-21 DIAGNOSIS — E538 Deficiency of other specified B group vitamins: Secondary | ICD-10-CM | POA: Diagnosis not present

## 2020-03-21 DIAGNOSIS — I10 Essential (primary) hypertension: Secondary | ICD-10-CM | POA: Diagnosis not present

## 2020-03-21 DIAGNOSIS — E119 Type 2 diabetes mellitus without complications: Secondary | ICD-10-CM | POA: Diagnosis not present

## 2020-03-21 DIAGNOSIS — E559 Vitamin D deficiency, unspecified: Secondary | ICD-10-CM | POA: Diagnosis not present

## 2020-03-21 DIAGNOSIS — I480 Paroxysmal atrial fibrillation: Secondary | ICD-10-CM | POA: Diagnosis not present

## 2020-03-21 DIAGNOSIS — S22000D Wedge compression fracture of unspecified thoracic vertebra, subsequent encounter for fracture with routine healing: Secondary | ICD-10-CM | POA: Diagnosis not present

## 2020-03-21 DIAGNOSIS — E785 Hyperlipidemia, unspecified: Secondary | ICD-10-CM | POA: Diagnosis not present

## 2020-03-21 DIAGNOSIS — I639 Cerebral infarction, unspecified: Secondary | ICD-10-CM | POA: Diagnosis not present

## 2020-03-21 DIAGNOSIS — I5032 Chronic diastolic (congestive) heart failure: Secondary | ICD-10-CM | POA: Diagnosis not present

## 2020-03-21 DIAGNOSIS — I739 Peripheral vascular disease, unspecified: Secondary | ICD-10-CM | POA: Diagnosis not present

## 2020-03-23 ENCOUNTER — Inpatient Hospital Stay: Payer: Dental | Admitting: Neurology

## 2020-04-04 DIAGNOSIS — R2689 Other abnormalities of gait and mobility: Secondary | ICD-10-CM | POA: Diagnosis not present

## 2020-04-04 DIAGNOSIS — M6281 Muscle weakness (generalized): Secondary | ICD-10-CM | POA: Diagnosis not present

## 2020-04-04 DIAGNOSIS — Z9181 History of falling: Secondary | ICD-10-CM | POA: Diagnosis not present

## 2020-04-04 DIAGNOSIS — I5032 Chronic diastolic (congestive) heart failure: Secondary | ICD-10-CM | POA: Diagnosis not present

## 2020-04-06 DIAGNOSIS — I5032 Chronic diastolic (congestive) heart failure: Secondary | ICD-10-CM | POA: Diagnosis not present

## 2020-04-06 DIAGNOSIS — M6281 Muscle weakness (generalized): Secondary | ICD-10-CM | POA: Diagnosis not present

## 2020-04-06 DIAGNOSIS — Z9181 History of falling: Secondary | ICD-10-CM | POA: Diagnosis not present

## 2020-04-06 DIAGNOSIS — R2689 Other abnormalities of gait and mobility: Secondary | ICD-10-CM | POA: Diagnosis not present

## 2020-04-07 DIAGNOSIS — M6281 Muscle weakness (generalized): Secondary | ICD-10-CM | POA: Diagnosis not present

## 2020-04-07 DIAGNOSIS — R2689 Other abnormalities of gait and mobility: Secondary | ICD-10-CM | POA: Diagnosis not present

## 2020-04-07 DIAGNOSIS — M542 Cervicalgia: Secondary | ICD-10-CM | POA: Diagnosis not present

## 2020-04-07 DIAGNOSIS — M546 Pain in thoracic spine: Secondary | ICD-10-CM | POA: Diagnosis not present

## 2020-04-07 DIAGNOSIS — Z9181 History of falling: Secondary | ICD-10-CM | POA: Diagnosis not present

## 2020-04-07 DIAGNOSIS — M545 Low back pain: Secondary | ICD-10-CM | POA: Diagnosis not present

## 2020-04-07 DIAGNOSIS — I5032 Chronic diastolic (congestive) heart failure: Secondary | ICD-10-CM | POA: Diagnosis not present

## 2020-04-11 DIAGNOSIS — R2689 Other abnormalities of gait and mobility: Secondary | ICD-10-CM | POA: Diagnosis not present

## 2020-04-11 DIAGNOSIS — I5032 Chronic diastolic (congestive) heart failure: Secondary | ICD-10-CM | POA: Diagnosis not present

## 2020-04-11 DIAGNOSIS — M6281 Muscle weakness (generalized): Secondary | ICD-10-CM | POA: Diagnosis not present

## 2020-04-11 DIAGNOSIS — Z9181 History of falling: Secondary | ICD-10-CM | POA: Diagnosis not present

## 2020-04-12 ENCOUNTER — Ambulatory Visit: Payer: Medicare Other | Admitting: Cardiology

## 2020-04-14 DIAGNOSIS — R2689 Other abnormalities of gait and mobility: Secondary | ICD-10-CM | POA: Diagnosis not present

## 2020-04-14 DIAGNOSIS — M6281 Muscle weakness (generalized): Secondary | ICD-10-CM | POA: Diagnosis not present

## 2020-04-14 DIAGNOSIS — Z9181 History of falling: Secondary | ICD-10-CM | POA: Diagnosis not present

## 2020-04-14 DIAGNOSIS — I5032 Chronic diastolic (congestive) heart failure: Secondary | ICD-10-CM | POA: Diagnosis not present

## 2020-04-17 DIAGNOSIS — M6281 Muscle weakness (generalized): Secondary | ICD-10-CM | POA: Diagnosis not present

## 2020-04-17 DIAGNOSIS — Z9181 History of falling: Secondary | ICD-10-CM | POA: Diagnosis not present

## 2020-04-17 DIAGNOSIS — R2689 Other abnormalities of gait and mobility: Secondary | ICD-10-CM | POA: Diagnosis not present

## 2020-04-17 DIAGNOSIS — I5032 Chronic diastolic (congestive) heart failure: Secondary | ICD-10-CM | POA: Diagnosis not present

## 2020-04-19 DIAGNOSIS — R2689 Other abnormalities of gait and mobility: Secondary | ICD-10-CM | POA: Diagnosis not present

## 2020-04-19 DIAGNOSIS — I5032 Chronic diastolic (congestive) heart failure: Secondary | ICD-10-CM | POA: Diagnosis not present

## 2020-04-19 DIAGNOSIS — M6281 Muscle weakness (generalized): Secondary | ICD-10-CM | POA: Diagnosis not present

## 2020-04-19 DIAGNOSIS — Z9181 History of falling: Secondary | ICD-10-CM | POA: Diagnosis not present

## 2020-04-20 DIAGNOSIS — I5032 Chronic diastolic (congestive) heart failure: Secondary | ICD-10-CM | POA: Diagnosis not present

## 2020-04-20 DIAGNOSIS — R2689 Other abnormalities of gait and mobility: Secondary | ICD-10-CM | POA: Diagnosis not present

## 2020-04-20 DIAGNOSIS — Z9181 History of falling: Secondary | ICD-10-CM | POA: Diagnosis not present

## 2020-04-20 DIAGNOSIS — M6281 Muscle weakness (generalized): Secondary | ICD-10-CM | POA: Diagnosis not present

## 2020-04-24 DIAGNOSIS — M545 Low back pain: Secondary | ICD-10-CM | POA: Diagnosis not present

## 2020-04-24 DIAGNOSIS — R2689 Other abnormalities of gait and mobility: Secondary | ICD-10-CM | POA: Diagnosis not present

## 2020-04-24 DIAGNOSIS — I5032 Chronic diastolic (congestive) heart failure: Secondary | ICD-10-CM | POA: Diagnosis not present

## 2020-04-24 DIAGNOSIS — R300 Dysuria: Secondary | ICD-10-CM | POA: Diagnosis not present

## 2020-04-24 DIAGNOSIS — M6281 Muscle weakness (generalized): Secondary | ICD-10-CM | POA: Diagnosis not present

## 2020-04-24 DIAGNOSIS — Z9181 History of falling: Secondary | ICD-10-CM | POA: Diagnosis not present

## 2020-04-24 DIAGNOSIS — E785 Hyperlipidemia, unspecified: Secondary | ICD-10-CM | POA: Diagnosis not present

## 2020-04-24 DIAGNOSIS — G8929 Other chronic pain: Secondary | ICD-10-CM | POA: Diagnosis not present

## 2020-04-24 DIAGNOSIS — E119 Type 2 diabetes mellitus without complications: Secondary | ICD-10-CM | POA: Diagnosis not present

## 2020-04-24 DIAGNOSIS — I110 Hypertensive heart disease with heart failure: Secondary | ICD-10-CM | POA: Diagnosis not present

## 2020-04-25 DIAGNOSIS — R2689 Other abnormalities of gait and mobility: Secondary | ICD-10-CM | POA: Diagnosis not present

## 2020-04-25 DIAGNOSIS — Z9181 History of falling: Secondary | ICD-10-CM | POA: Diagnosis not present

## 2020-04-25 DIAGNOSIS — I5032 Chronic diastolic (congestive) heart failure: Secondary | ICD-10-CM | POA: Diagnosis not present

## 2020-04-25 DIAGNOSIS — M6281 Muscle weakness (generalized): Secondary | ICD-10-CM | POA: Diagnosis not present

## 2020-04-27 DIAGNOSIS — I5032 Chronic diastolic (congestive) heart failure: Secondary | ICD-10-CM | POA: Diagnosis not present

## 2020-04-27 DIAGNOSIS — Z9181 History of falling: Secondary | ICD-10-CM | POA: Diagnosis not present

## 2020-04-27 DIAGNOSIS — M6281 Muscle weakness (generalized): Secondary | ICD-10-CM | POA: Diagnosis not present

## 2020-04-27 DIAGNOSIS — R2689 Other abnormalities of gait and mobility: Secondary | ICD-10-CM | POA: Diagnosis not present

## 2020-05-01 DIAGNOSIS — R2689 Other abnormalities of gait and mobility: Secondary | ICD-10-CM | POA: Diagnosis not present

## 2020-05-01 DIAGNOSIS — I5032 Chronic diastolic (congestive) heart failure: Secondary | ICD-10-CM | POA: Diagnosis not present

## 2020-05-01 DIAGNOSIS — M6281 Muscle weakness (generalized): Secondary | ICD-10-CM | POA: Diagnosis not present

## 2020-05-01 DIAGNOSIS — Z9181 History of falling: Secondary | ICD-10-CM | POA: Diagnosis not present

## 2020-05-02 ENCOUNTER — Encounter: Payer: Medicare Other | Admitting: Internal Medicine

## 2020-05-02 DIAGNOSIS — Z9181 History of falling: Secondary | ICD-10-CM | POA: Diagnosis not present

## 2020-05-02 DIAGNOSIS — M6281 Muscle weakness (generalized): Secondary | ICD-10-CM | POA: Diagnosis not present

## 2020-05-02 DIAGNOSIS — R2689 Other abnormalities of gait and mobility: Secondary | ICD-10-CM | POA: Diagnosis not present

## 2020-05-02 DIAGNOSIS — I5032 Chronic diastolic (congestive) heart failure: Secondary | ICD-10-CM | POA: Diagnosis not present

## 2020-05-03 DIAGNOSIS — I5032 Chronic diastolic (congestive) heart failure: Secondary | ICD-10-CM | POA: Diagnosis not present

## 2020-05-03 DIAGNOSIS — M6281 Muscle weakness (generalized): Secondary | ICD-10-CM | POA: Diagnosis not present

## 2020-05-03 DIAGNOSIS — Z9181 History of falling: Secondary | ICD-10-CM | POA: Diagnosis not present

## 2020-05-03 DIAGNOSIS — R2689 Other abnormalities of gait and mobility: Secondary | ICD-10-CM | POA: Diagnosis not present

## 2020-05-08 DIAGNOSIS — M6281 Muscle weakness (generalized): Secondary | ICD-10-CM | POA: Diagnosis not present

## 2020-05-08 DIAGNOSIS — Z9181 History of falling: Secondary | ICD-10-CM | POA: Diagnosis not present

## 2020-05-08 DIAGNOSIS — I5032 Chronic diastolic (congestive) heart failure: Secondary | ICD-10-CM | POA: Diagnosis not present

## 2020-05-08 DIAGNOSIS — R2689 Other abnormalities of gait and mobility: Secondary | ICD-10-CM | POA: Diagnosis not present

## 2020-05-10 ENCOUNTER — Ambulatory Visit (INDEPENDENT_AMBULATORY_CARE_PROVIDER_SITE_OTHER): Payer: Medicare Other | Admitting: *Deleted

## 2020-05-10 DIAGNOSIS — Z9181 History of falling: Secondary | ICD-10-CM | POA: Diagnosis not present

## 2020-05-10 DIAGNOSIS — I442 Atrioventricular block, complete: Secondary | ICD-10-CM | POA: Diagnosis not present

## 2020-05-10 DIAGNOSIS — I5032 Chronic diastolic (congestive) heart failure: Secondary | ICD-10-CM | POA: Diagnosis not present

## 2020-05-10 DIAGNOSIS — M6281 Muscle weakness (generalized): Secondary | ICD-10-CM | POA: Diagnosis not present

## 2020-05-10 DIAGNOSIS — R2689 Other abnormalities of gait and mobility: Secondary | ICD-10-CM | POA: Diagnosis not present

## 2020-05-11 ENCOUNTER — Other Ambulatory Visit: Payer: Self-pay

## 2020-05-11 ENCOUNTER — Telehealth (INDEPENDENT_AMBULATORY_CARE_PROVIDER_SITE_OTHER): Payer: Medicare Other | Admitting: Internal Medicine

## 2020-05-11 VITALS — BP 122/71 | HR 70 | Temp 97.4°F | Ht 60.0 in | Wt 110.0 lb

## 2020-05-11 DIAGNOSIS — I482 Chronic atrial fibrillation, unspecified: Secondary | ICD-10-CM

## 2020-05-11 DIAGNOSIS — M6281 Muscle weakness (generalized): Secondary | ICD-10-CM | POA: Diagnosis not present

## 2020-05-11 DIAGNOSIS — I2510 Atherosclerotic heart disease of native coronary artery without angina pectoris: Secondary | ICD-10-CM

## 2020-05-11 DIAGNOSIS — I5033 Acute on chronic diastolic (congestive) heart failure: Secondary | ICD-10-CM | POA: Diagnosis not present

## 2020-05-11 DIAGNOSIS — I5032 Chronic diastolic (congestive) heart failure: Secondary | ICD-10-CM | POA: Diagnosis not present

## 2020-05-11 DIAGNOSIS — Z9181 History of falling: Secondary | ICD-10-CM | POA: Diagnosis not present

## 2020-05-11 DIAGNOSIS — R2689 Other abnormalities of gait and mobility: Secondary | ICD-10-CM | POA: Diagnosis not present

## 2020-05-11 NOTE — Patient Instructions (Addendum)
Medication Instructions:  Your physician recommends that you continue on your current medications as directed. Please refer to the Current Medication list given to you today.  Labwork: None ordered.  Testing/Procedures: None ordered.  Follow-Up: Your physician recommends that you schedule a follow-up appointment in:   9 months with Dr. Graciela Husbands  Remote pacer check Aug 09, 2020  Any Other Special Instructions Will Be Listed Below (If Applicable).     If you need a refill on your cardiac medications before your next appointment, please call your pharmacy.

## 2020-05-11 NOTE — Progress Notes (Signed)
Electrophysiology TeleHealth Note   Due to national recommendations of social distancing due to COVID 19, an audio/video telehealth visit is felt to be most appropriate for this patient at this time.  See MyChart message from today for the patient's consent to telehealth for [facility].   Date:  05/11/2020   ID:  Garrett Hughes, DOB 05-16-1941, MRN [id number]  Location: patient's home [place] facility  Provider location: [address] [place]  Evaluation Performed: Follow-up visit  PCP:  Emeterio Reeve, DO  Cardiologist:   [facility] Electrophysiologist:  Garrett Hughes   Chief Complaint:  Pacemaker   History of Present Illness:    Garrett Hughes is a 79 y.o. male who presents via audio/video conferencing for a telehealth visit today.  Since last being seen in hospital now  in follow-up for a pacemaker-[employer] Jude implanted 5/21 for complete heart block developing in the context of TAVR 5/21 and antecedent right bundle branch block, the patient reports no dyspnea, but no chest pain but back pain worse with walking.   Has atrial fibrillation-permanent and has declined anticoagulation.  Ischemic heart disease with prior bypass surgery.  4/21 atherectomy and PCI  Some edema; high fall risk   Ended up nursing center at [facility]   Significant interval weight loss  Trying to feed whatever he will eat.    The patient denies symptoms of fevers, chills, cough, or new SOB worrisome for COVID 19.    Past Medical History:  Diagnosis Date  . Anemia   . Arthritis   . Atherosclerosis of native coronary artery of native heart without angina pectoris 01/05/2016  . Benign essential hypertension 01/05/2016  . Carotid artery disease ([place])   . Cataract, nuclear, right 05/06/2014  . Chronic atrial fibrillation ([place]) 12/20/2015  . Chronic diarrhea   . COPD (chronic obstructive pulmonary disease) (HCC)    on nocturnal O2  . Current use of long term anticoagulation 12/20/2015    . Diabetic kidney disease ([place]) 01/05/2016  . GERD (gastroesophageal reflux disease)   . GI bleed   . History of Barrett's esophagus 12/20/2015  . History of blood in urine   . History of kidney stones   . 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 Hyperlipemia 10/26/2015  . Low back pain 05/02/2016  . S/P TAVR (transcatheter aortic valve replacement) 01/18/2020   s/p successful TAVR w/ a 26 mm Edwards S3U via the left subclavian approach (failed Shockwave TF) with Dr. Burt Knack and Dr. Roxy Manns   . Severe aortic stenosis   . Thrombocytopenia ([place])   . Type 2 diabetes mellitus with kidney complication, without long-term current use of insulin ([place]) 07/03/2016    Past Surgical History:  Procedure Laterality Date  . AORTOGRAM  01/18/2020   Procedure: Aortogram;  Surgeon: Sherren Mocha, MD;  Location: [place];  Service: Open Heart Surgery;;  . BACK SURGERY  2004  . [place]  . CORONARY ARTERY BYPASS GRAFT  1997  . CORONARY ATHERECTOMY N/A 12/24/2019   Procedure: CORONARY ATHERECTOMY;  Surgeon: Sherren Mocha, MD;  Location: [place] [facility];  Service: Cardiovascular;  Laterality: N/A;  . INTRAOPERATIVE TRANSESOPHAGEAL ECHOCARDIOGRAM  01/18/2020   Procedure: Intraoperative Transesophageal Echocardiogram;  Surgeon: Sherren Mocha, MD;  Location: [place];  Service: Open Heart Surgery;;  . INTRAOPERATIVE TRANSTHORACIC ECHOCARDIOGRAM N/A 01/18/2020   Procedure: Intraoperative Transthoracic Echocardiogram;  Surgeon: Sherren Mocha, MD;  [facility];  Service: Open Heart Surgery;  Laterality: N/A;  . LOWER EXTREMITY ANGIOGRAM  01/18/2020   Procedure: Lower Extremity Angiogram;  Surgeon: Sherren Mocha, MD;  Location: [place];  Service: Open Heart Surgery;;  Shockwave  . PACEMAKER IMPLANT N/A 01/24/2020   Procedure: PACEMAKER IMPLANT;  Surgeon: Deboraha Sprang, MD;  Location: [place] [facility];  Service: Cardiovascular;  Laterality: N/A;  . RIGHT HEART  CATH AND CORONARY/GRAFT ANGIOGRAPHY N/A 11/26/2019   Procedure: RIGHT HEART CATH AND CORONARY/GRAFT ANGIOGRAPHY;  Surgeon: Sherren Mocha, MD;  Location: [place] [facility];  Service: Cardiovascular;  Laterality: N/A;  . SHOULDER SURGERY  2005  . TRANSCATHETER AORTIC VALVE REPLACEMENT, TRANSFEMORAL Left 01/18/2020   Procedure: Transcatheter Aortic Valve Replacement (subclavian approach);  Surgeon: Sherren Mocha, MD;  Location: [place];  Service: Open Heart Surgery;  Laterality: Left;    Current Outpatient Medications  Medication Sig Dispense Refill  . ACCU-CHEK SOFTCLIX LANCETS lancets Use as instructed to check blood sugar up to qid. Dx Code E11.22. 100 each 12  . acetaminophen (TYLENOL) 325 MG tablet Take 650 mg by mouth every 6 (six) hours as needed.    . Alcohol Swabs PADS Use as instructed to check blood sugar up to qid. Dx code E11.22 100 each 1  . AMBULATORY NON FORMULARY MEDICATION Medication Name: accu-chek aviva plus meter, accu-check aviva plus test strips & accu-chek softclix lancets to test twice a day. Dx: E11.29 1 vial PRN  . amoxicillin (AMOXIL) 500 MG capsule Take 4 tablets by mouth 1 hour prior to any dental work 4 capsule 3  . Blood Glucose Calibration (ACCU-CHEK AVIVA) SOLN Use as instructed per manufacturer directions. Dx code E11.22 1 each 0  . Blood Glucose Monitoring Suppl (ACCU-CHEK AVIVA PLUS) w/Device KIT Use as instructed to check blood sugar up to qid. Dx code E11.22 1 kit 0  . busPIRone (BUSPAR) 5 MG tablet Take 5 mg by mouth daily.    . calcium carbonate (TUMS - DOSED IN MG ELEMENTAL CALCIUM) 500 MG chewable tablet Chew 1 tablet by mouth daily.    . clopidogrel (PLAVIX) 75 MG tablet Take 75 mg by mouth daily.    . divalproex (DEPAKOTE) 125 MG DR tablet Take 125 mg by mouth in the morning and at bedtime.    . docusate sodium (COLACE) 100 MG capsule Take 100 mg by mouth 2 (two) times daily.    Marland Kitchen doxazosin (CARDURA) 4 MG tablet Take 4 mg by mouth daily.    . ferrous  sulfate 325 (65 FE) MG EC tablet Take 1 tablet (325 mg total) by mouth daily with breakfast. 90 tablet 3  . furosemide (LASIX) 40 MG tablet Take 0.5 tablets (20 mg total) by mouth daily.    Marland Kitchen glipiZIDE (GLUCOTROL) 5 MG tablet TAKE 1 TABLET BY MOUTH  TWICE DAILY BEFORE MEALS 180 tablet 0  . glucose blood (ACCU-CHEK AVIVA PLUS) test strip Use as instructed to check blood sugar up to qid. Dx Code E11.22. Disp generic per pt preference / insurance coverage 100 each 99  . insulin aspart (NOVOLOG) 100 UNIT/ML injection Inject into the skin 2 (two) times daily before a meal.    . Lancets (ONETOUCH ULTRASOFT) lancets Use as instructed to check blood sugar up to qid. Dx: E11.22 100 each 99  . loratadine (LORADAMED) 10 MG tablet Take 10 mg by mouth daily.    . metFORMIN (GLUCOPHAGE) 1000 MG tablet TAKE 1 TABLET BY MOUTH  TWICE DAILY WITH A MEAL  180 tablet 0  . metoprolol tartrate (LOPRESSOR) 25 MG tablet Take 25 mg by mouth 2 (two) times daily.    . mirtazapine (REMERON) 15 MG tablet Take 15 mg by mouth at bedtime.    . pantoprazole (PROTONIX) 20 MG tablet TAKE 1 TABLET BY MOUTH  DAILY 90 tablet 0  . polyethylene glycol (MIRALAX / GLYCOLAX) 17 g packet Take 17 g by mouth daily.    . pravastatin (PRAVACHOL) 40 MG tablet Take 1 tablet (40 mg total) by mouth daily. 90 tablet 0  . traMADol (ULTRAM) 50 MG tablet Take by mouth 2 (two) times daily.    Marland Kitchen aspirin EC 81 MG tablet Take 1 tablet (81 mg total) by mouth daily. Swallow whole. (Patient not taking: Reported on 05/11/2020) 90 tablet 3  . melatonin 5 MG TABS Take 5 mg by mouth at bedtime. (Patient not taking: Reported on 05/11/2020)    . metoprolol succinate (TOPROL-XL) 25 MG 24 hr tablet Take 1 tablet (25 mg total) by mouth daily. (Patient not taking: Reported on 05/11/2020) 90 tablet 0  . nitroGLYCERIN (NITROSTAT) 0.4 MG SL tablet Place 1 tablet (0.4 mg total) under the tongue every 5 (five) minutes as needed for chest pain. (Patient not taking: Reported on  05/11/2020) 30 tablet 1   No current facility-administered medications for this visit.    Allergies:   Atorvastatin, Cholestyramine, and Pravastatin   Social History:  The patient  reports that he quit smoking about 32 years ago. He has never used smokeless tobacco. He reports that he does not drink alcohol and does not use drugs.   Family History:  The patient's   family history includes CAD in his father.   ROS:  Please see the history of present illness.   All other systems are personally reviewed and negative.    Exam:    Vital Signs:  BP 122/71   Pulse 70   Temp (!) 97.4 F (36.3 C) (Temporal)   Ht 5' (1.524 m)   Wt 110 lb (49.9 kg)   SpO2 96%   BMI 21.48 kg/m       Labs/Other Tests and Data Reviewed:    Recent Labs: 12/28/2019: TSH 3.429 01/13/2020: ALT 16; B Natriuretic Peptide 416.5 01/20/2020: Magnesium 1.9 01/26/2020: BUN 23; Creatinine, Ser 1.17; Hemoglobin 10.8; Platelets 226; Potassium 4.0; Sodium 141   Wt Readings from Last 3 Encounters:  05/11/20 110 lb (49.9 kg)  03/16/20 122 lb (55.3 kg)  01/28/20 121 lb 14.6 oz (55.3 kg)     Other studies personally reviewed:      Last device remote is reviewed from [place] PDF dated 7/21 which reveals normal device function,   arrhythmias - VP 99%   ASSESSMENT & PLAN:    Complete heart block  Right bundle branch block at baseline  Aortic stenosis status post TAVR via LSCA approach  Atrial fibrillation-permanent declined anticoagulation  Stroke-intercurrent-acute  Peripheral vascular disease with bilateral severe carotid disease  Ischemic heart disease  S/p CABG   Some edema by report continue current meds; nurses are following  Without symptoms of ischemia  No LH  Device function normal  Unfortunately after his strokes he was not able to return home but had to go to skilled care  Losing weight   COVID 19 screen The patient denies symptoms of COVID 19 at this time.  The importance of social  distancing was discussed today.  Follow-up:  66M, Next remote: As Scheduled   Current medicines are reviewed at  length with the patient today.   The patient does not have concerns regarding his medicines.  The following changes were made today:  none  Labs/ tests ordered today include:   No orders of the defined types were placed in this encounter.   Future tests ( post COVID )     Patient Risk:  after full review of this patients clinical status, I feel that they are at moderate risk at this time.  Today, I have spent 12* minutes with the patient with telehealth technology discussing the above.  Signed, Virl Axe, MD  05/11/2020 2:19 PM     [place] [address] [phone] (office) [phone] (fax)

## 2020-05-12 DIAGNOSIS — R2689 Other abnormalities of gait and mobility: Secondary | ICD-10-CM | POA: Diagnosis not present

## 2020-05-12 DIAGNOSIS — M6281 Muscle weakness (generalized): Secondary | ICD-10-CM | POA: Diagnosis not present

## 2020-05-12 DIAGNOSIS — Z9181 History of falling: Secondary | ICD-10-CM | POA: Diagnosis not present

## 2020-05-12 DIAGNOSIS — I5032 Chronic diastolic (congestive) heart failure: Secondary | ICD-10-CM | POA: Diagnosis not present

## 2020-05-12 LAB — CUP PACEART REMOTE DEVICE CHECK
Battery Remaining Longevity: 83 mo
Battery Remaining Percentage: 95.5 %
Battery Voltage: 3.01 V
Brady Statistic RV Percent Paced: 99 %
Date Time Interrogation Session: 20210825200219
Implantable Lead Implant Date: 20210517
Implantable Lead Location: 753860
Implantable Pulse Generator Implant Date: 20210517
Lead Channel Impedance Value: 490 Ohm
Lead Channel Pacing Threshold Amplitude: 0.75 V
Lead Channel Pacing Threshold Pulse Width: 0.5 ms
Lead Channel Sensing Intrinsic Amplitude: 12 mV
Lead Channel Setting Pacing Amplitude: 3.5 V
Lead Channel Setting Pacing Pulse Width: 0.5 ms
Lead Channel Setting Sensing Sensitivity: 4 mV
Pulse Gen Model: 1272
Pulse Gen Serial Number: 3805346

## 2020-05-12 NOTE — Progress Notes (Signed)
Remote pacemaker transmission.   

## 2020-05-16 DIAGNOSIS — Z9181 History of falling: Secondary | ICD-10-CM | POA: Diagnosis not present

## 2020-05-16 DIAGNOSIS — M6281 Muscle weakness (generalized): Secondary | ICD-10-CM | POA: Diagnosis not present

## 2020-05-16 DIAGNOSIS — R2689 Other abnormalities of gait and mobility: Secondary | ICD-10-CM | POA: Diagnosis not present

## 2020-05-16 DIAGNOSIS — I5032 Chronic diastolic (congestive) heart failure: Secondary | ICD-10-CM | POA: Diagnosis not present

## 2020-05-17 DIAGNOSIS — M6281 Muscle weakness (generalized): Secondary | ICD-10-CM | POA: Diagnosis not present

## 2020-05-17 DIAGNOSIS — R2689 Other abnormalities of gait and mobility: Secondary | ICD-10-CM | POA: Diagnosis not present

## 2020-05-17 DIAGNOSIS — Z9181 History of falling: Secondary | ICD-10-CM | POA: Diagnosis not present

## 2020-05-17 DIAGNOSIS — I5032 Chronic diastolic (congestive) heart failure: Secondary | ICD-10-CM | POA: Diagnosis not present

## 2020-05-18 DIAGNOSIS — Z9181 History of falling: Secondary | ICD-10-CM | POA: Diagnosis not present

## 2020-05-18 DIAGNOSIS — I5032 Chronic diastolic (congestive) heart failure: Secondary | ICD-10-CM | POA: Diagnosis not present

## 2020-05-18 DIAGNOSIS — M6281 Muscle weakness (generalized): Secondary | ICD-10-CM | POA: Diagnosis not present

## 2020-05-18 DIAGNOSIS — R2689 Other abnormalities of gait and mobility: Secondary | ICD-10-CM | POA: Diagnosis not present

## 2020-05-23 DIAGNOSIS — M6281 Muscle weakness (generalized): Secondary | ICD-10-CM | POA: Diagnosis not present

## 2020-05-23 DIAGNOSIS — R2689 Other abnormalities of gait and mobility: Secondary | ICD-10-CM | POA: Diagnosis not present

## 2020-05-23 DIAGNOSIS — Z9181 History of falling: Secondary | ICD-10-CM | POA: Diagnosis not present

## 2020-05-23 DIAGNOSIS — I5032 Chronic diastolic (congestive) heart failure: Secondary | ICD-10-CM | POA: Diagnosis not present

## 2020-05-24 DIAGNOSIS — I5032 Chronic diastolic (congestive) heart failure: Secondary | ICD-10-CM | POA: Diagnosis not present

## 2020-05-24 DIAGNOSIS — Z9181 History of falling: Secondary | ICD-10-CM | POA: Diagnosis not present

## 2020-05-24 DIAGNOSIS — M6281 Muscle weakness (generalized): Secondary | ICD-10-CM | POA: Diagnosis not present

## 2020-05-24 DIAGNOSIS — R2689 Other abnormalities of gait and mobility: Secondary | ICD-10-CM | POA: Diagnosis not present

## 2020-05-25 DIAGNOSIS — R2689 Other abnormalities of gait and mobility: Secondary | ICD-10-CM | POA: Diagnosis not present

## 2020-05-25 DIAGNOSIS — Z9181 History of falling: Secondary | ICD-10-CM | POA: Diagnosis not present

## 2020-05-25 DIAGNOSIS — I5032 Chronic diastolic (congestive) heart failure: Secondary | ICD-10-CM | POA: Diagnosis not present

## 2020-05-25 DIAGNOSIS — M6281 Muscle weakness (generalized): Secondary | ICD-10-CM | POA: Diagnosis not present

## 2020-05-26 DIAGNOSIS — R339 Retention of urine, unspecified: Secondary | ICD-10-CM | POA: Diagnosis not present

## 2020-05-26 DIAGNOSIS — N401 Enlarged prostate with lower urinary tract symptoms: Secondary | ICD-10-CM | POA: Insufficient documentation

## 2020-05-26 DIAGNOSIS — N390 Urinary tract infection, site not specified: Secondary | ICD-10-CM | POA: Diagnosis not present

## 2020-05-26 DIAGNOSIS — N138 Other obstructive and reflux uropathy: Secondary | ICD-10-CM | POA: Diagnosis not present

## 2020-05-30 DIAGNOSIS — Z9181 History of falling: Secondary | ICD-10-CM | POA: Diagnosis not present

## 2020-05-30 DIAGNOSIS — R2689 Other abnormalities of gait and mobility: Secondary | ICD-10-CM | POA: Diagnosis not present

## 2020-05-30 DIAGNOSIS — I5032 Chronic diastolic (congestive) heart failure: Secondary | ICD-10-CM | POA: Diagnosis not present

## 2020-05-30 DIAGNOSIS — M6281 Muscle weakness (generalized): Secondary | ICD-10-CM | POA: Diagnosis not present

## 2020-05-31 DIAGNOSIS — Z9181 History of falling: Secondary | ICD-10-CM | POA: Diagnosis not present

## 2020-05-31 DIAGNOSIS — I739 Peripheral vascular disease, unspecified: Secondary | ICD-10-CM | POA: Diagnosis not present

## 2020-05-31 DIAGNOSIS — G8929 Other chronic pain: Secondary | ICD-10-CM | POA: Diagnosis not present

## 2020-05-31 DIAGNOSIS — M6281 Muscle weakness (generalized): Secondary | ICD-10-CM | POA: Diagnosis not present

## 2020-05-31 DIAGNOSIS — I4891 Unspecified atrial fibrillation: Secondary | ICD-10-CM | POA: Diagnosis not present

## 2020-05-31 DIAGNOSIS — I5032 Chronic diastolic (congestive) heart failure: Secondary | ICD-10-CM | POA: Diagnosis not present

## 2020-05-31 DIAGNOSIS — R2689 Other abnormalities of gait and mobility: Secondary | ICD-10-CM | POA: Diagnosis not present

## 2020-05-31 DIAGNOSIS — N138 Other obstructive and reflux uropathy: Secondary | ICD-10-CM | POA: Diagnosis not present

## 2020-06-02 DIAGNOSIS — R2689 Other abnormalities of gait and mobility: Secondary | ICD-10-CM | POA: Diagnosis not present

## 2020-06-02 DIAGNOSIS — M6281 Muscle weakness (generalized): Secondary | ICD-10-CM | POA: Diagnosis not present

## 2020-06-02 DIAGNOSIS — Z9181 History of falling: Secondary | ICD-10-CM | POA: Diagnosis not present

## 2020-06-02 DIAGNOSIS — I5032 Chronic diastolic (congestive) heart failure: Secondary | ICD-10-CM | POA: Diagnosis not present

## 2020-06-06 DIAGNOSIS — I5032 Chronic diastolic (congestive) heart failure: Secondary | ICD-10-CM | POA: Diagnosis not present

## 2020-06-06 DIAGNOSIS — Z9181 History of falling: Secondary | ICD-10-CM | POA: Diagnosis not present

## 2020-06-06 DIAGNOSIS — M6281 Muscle weakness (generalized): Secondary | ICD-10-CM | POA: Diagnosis not present

## 2020-06-06 DIAGNOSIS — R2689 Other abnormalities of gait and mobility: Secondary | ICD-10-CM | POA: Diagnosis not present

## 2020-06-07 DIAGNOSIS — M6281 Muscle weakness (generalized): Secondary | ICD-10-CM | POA: Diagnosis not present

## 2020-06-07 DIAGNOSIS — R2689 Other abnormalities of gait and mobility: Secondary | ICD-10-CM | POA: Diagnosis not present

## 2020-06-07 DIAGNOSIS — I5032 Chronic diastolic (congestive) heart failure: Secondary | ICD-10-CM | POA: Diagnosis not present

## 2020-06-07 DIAGNOSIS — Z9181 History of falling: Secondary | ICD-10-CM | POA: Diagnosis not present

## 2020-06-09 DIAGNOSIS — M6281 Muscle weakness (generalized): Secondary | ICD-10-CM | POA: Diagnosis not present

## 2020-06-09 DIAGNOSIS — Z9181 History of falling: Secondary | ICD-10-CM | POA: Diagnosis not present

## 2020-06-09 DIAGNOSIS — R1312 Dysphagia, oropharyngeal phase: Secondary | ICD-10-CM | POA: Diagnosis not present

## 2020-06-09 DIAGNOSIS — R2689 Other abnormalities of gait and mobility: Secondary | ICD-10-CM | POA: Diagnosis not present

## 2020-06-09 DIAGNOSIS — R059 Cough, unspecified: Secondary | ICD-10-CM | POA: Insufficient documentation

## 2020-06-09 DIAGNOSIS — I5032 Chronic diastolic (congestive) heart failure: Secondary | ICD-10-CM | POA: Diagnosis not present

## 2020-06-13 DIAGNOSIS — M6281 Muscle weakness (generalized): Secondary | ICD-10-CM | POA: Diagnosis not present

## 2020-06-13 DIAGNOSIS — R1312 Dysphagia, oropharyngeal phase: Secondary | ICD-10-CM | POA: Diagnosis not present

## 2020-06-13 DIAGNOSIS — Z9181 History of falling: Secondary | ICD-10-CM | POA: Diagnosis not present

## 2020-06-13 DIAGNOSIS — I5032 Chronic diastolic (congestive) heart failure: Secondary | ICD-10-CM | POA: Diagnosis not present

## 2020-06-13 DIAGNOSIS — R2689 Other abnormalities of gait and mobility: Secondary | ICD-10-CM | POA: Diagnosis not present

## 2020-06-14 DIAGNOSIS — I5032 Chronic diastolic (congestive) heart failure: Secondary | ICD-10-CM | POA: Diagnosis not present

## 2020-06-14 DIAGNOSIS — R1312 Dysphagia, oropharyngeal phase: Secondary | ICD-10-CM | POA: Diagnosis not present

## 2020-06-14 DIAGNOSIS — M6281 Muscle weakness (generalized): Secondary | ICD-10-CM | POA: Diagnosis not present

## 2020-06-14 DIAGNOSIS — R2689 Other abnormalities of gait and mobility: Secondary | ICD-10-CM | POA: Diagnosis not present

## 2020-06-14 DIAGNOSIS — Z9181 History of falling: Secondary | ICD-10-CM | POA: Diagnosis not present

## 2020-06-15 ENCOUNTER — Encounter (HOSPITAL_COMMUNITY): Payer: Self-pay | Admitting: Cardiovascular Disease

## 2020-06-16 DIAGNOSIS — N3281 Overactive bladder: Secondary | ICD-10-CM | POA: Insufficient documentation

## 2020-06-16 DIAGNOSIS — N138 Other obstructive and reflux uropathy: Secondary | ICD-10-CM | POA: Diagnosis not present

## 2020-06-16 DIAGNOSIS — R3589 Other polyuria: Secondary | ICD-10-CM | POA: Diagnosis not present

## 2020-06-16 DIAGNOSIS — R300 Dysuria: Secondary | ICD-10-CM | POA: Diagnosis not present

## 2020-06-16 DIAGNOSIS — N3289 Other specified disorders of bladder: Secondary | ICD-10-CM | POA: Insufficient documentation

## 2020-06-19 DIAGNOSIS — R1312 Dysphagia, oropharyngeal phase: Secondary | ICD-10-CM | POA: Diagnosis not present

## 2020-06-19 DIAGNOSIS — M6281 Muscle weakness (generalized): Secondary | ICD-10-CM | POA: Diagnosis not present

## 2020-06-19 DIAGNOSIS — I5032 Chronic diastolic (congestive) heart failure: Secondary | ICD-10-CM | POA: Diagnosis not present

## 2020-06-19 DIAGNOSIS — Z9181 History of falling: Secondary | ICD-10-CM | POA: Diagnosis not present

## 2020-06-19 DIAGNOSIS — R2689 Other abnormalities of gait and mobility: Secondary | ICD-10-CM | POA: Diagnosis not present

## 2020-06-26 DIAGNOSIS — I5032 Chronic diastolic (congestive) heart failure: Secondary | ICD-10-CM | POA: Diagnosis not present

## 2020-06-26 DIAGNOSIS — M6281 Muscle weakness (generalized): Secondary | ICD-10-CM | POA: Diagnosis not present

## 2020-06-26 DIAGNOSIS — R2689 Other abnormalities of gait and mobility: Secondary | ICD-10-CM | POA: Diagnosis not present

## 2020-06-26 DIAGNOSIS — Z9181 History of falling: Secondary | ICD-10-CM | POA: Diagnosis not present

## 2020-06-26 DIAGNOSIS — R1312 Dysphagia, oropharyngeal phase: Secondary | ICD-10-CM | POA: Diagnosis not present

## 2020-06-27 DIAGNOSIS — M6281 Muscle weakness (generalized): Secondary | ICD-10-CM | POA: Diagnosis not present

## 2020-06-27 DIAGNOSIS — R1312 Dysphagia, oropharyngeal phase: Secondary | ICD-10-CM | POA: Diagnosis not present

## 2020-06-27 DIAGNOSIS — I5032 Chronic diastolic (congestive) heart failure: Secondary | ICD-10-CM | POA: Diagnosis not present

## 2020-06-27 DIAGNOSIS — R2689 Other abnormalities of gait and mobility: Secondary | ICD-10-CM | POA: Diagnosis not present

## 2020-06-27 DIAGNOSIS — Z9181 History of falling: Secondary | ICD-10-CM | POA: Diagnosis not present

## 2020-06-29 DIAGNOSIS — R1312 Dysphagia, oropharyngeal phase: Secondary | ICD-10-CM | POA: Diagnosis not present

## 2020-06-29 DIAGNOSIS — R2689 Other abnormalities of gait and mobility: Secondary | ICD-10-CM | POA: Diagnosis not present

## 2020-06-29 DIAGNOSIS — I5032 Chronic diastolic (congestive) heart failure: Secondary | ICD-10-CM | POA: Diagnosis not present

## 2020-06-29 DIAGNOSIS — M6281 Muscle weakness (generalized): Secondary | ICD-10-CM | POA: Diagnosis not present

## 2020-06-29 DIAGNOSIS — Z9181 History of falling: Secondary | ICD-10-CM | POA: Diagnosis not present

## 2020-06-30 DIAGNOSIS — M6281 Muscle weakness (generalized): Secondary | ICD-10-CM | POA: Diagnosis not present

## 2020-06-30 DIAGNOSIS — R1312 Dysphagia, oropharyngeal phase: Secondary | ICD-10-CM | POA: Diagnosis not present

## 2020-06-30 DIAGNOSIS — I5032 Chronic diastolic (congestive) heart failure: Secondary | ICD-10-CM | POA: Diagnosis not present

## 2020-06-30 DIAGNOSIS — R2689 Other abnormalities of gait and mobility: Secondary | ICD-10-CM | POA: Diagnosis not present

## 2020-06-30 DIAGNOSIS — Z9181 History of falling: Secondary | ICD-10-CM | POA: Diagnosis not present

## 2020-07-03 DIAGNOSIS — I5032 Chronic diastolic (congestive) heart failure: Secondary | ICD-10-CM | POA: Diagnosis not present

## 2020-07-03 DIAGNOSIS — R2689 Other abnormalities of gait and mobility: Secondary | ICD-10-CM | POA: Diagnosis not present

## 2020-07-03 DIAGNOSIS — E1159 Type 2 diabetes mellitus with other circulatory complications: Secondary | ICD-10-CM | POA: Diagnosis not present

## 2020-07-03 DIAGNOSIS — M79672 Pain in left foot: Secondary | ICD-10-CM | POA: Diagnosis not present

## 2020-07-03 DIAGNOSIS — R1312 Dysphagia, oropharyngeal phase: Secondary | ICD-10-CM | POA: Diagnosis not present

## 2020-07-03 DIAGNOSIS — M6281 Muscle weakness (generalized): Secondary | ICD-10-CM | POA: Diagnosis not present

## 2020-07-03 DIAGNOSIS — Z9181 History of falling: Secondary | ICD-10-CM | POA: Diagnosis not present

## 2020-07-03 DIAGNOSIS — L84 Corns and callosities: Secondary | ICD-10-CM | POA: Diagnosis not present

## 2020-07-03 DIAGNOSIS — M79671 Pain in right foot: Secondary | ICD-10-CM | POA: Diagnosis not present

## 2020-07-03 DIAGNOSIS — L602 Onychogryphosis: Secondary | ICD-10-CM | POA: Diagnosis not present

## 2020-07-05 DIAGNOSIS — M6281 Muscle weakness (generalized): Secondary | ICD-10-CM | POA: Diagnosis not present

## 2020-07-05 DIAGNOSIS — R1312 Dysphagia, oropharyngeal phase: Secondary | ICD-10-CM | POA: Diagnosis not present

## 2020-07-05 DIAGNOSIS — R2689 Other abnormalities of gait and mobility: Secondary | ICD-10-CM | POA: Diagnosis not present

## 2020-07-05 DIAGNOSIS — I5032 Chronic diastolic (congestive) heart failure: Secondary | ICD-10-CM | POA: Diagnosis not present

## 2020-07-05 DIAGNOSIS — Z9181 History of falling: Secondary | ICD-10-CM | POA: Diagnosis not present

## 2020-07-06 DIAGNOSIS — R1312 Dysphagia, oropharyngeal phase: Secondary | ICD-10-CM | POA: Diagnosis not present

## 2020-07-06 DIAGNOSIS — I5032 Chronic diastolic (congestive) heart failure: Secondary | ICD-10-CM | POA: Diagnosis not present

## 2020-07-06 DIAGNOSIS — M6281 Muscle weakness (generalized): Secondary | ICD-10-CM | POA: Diagnosis not present

## 2020-07-06 DIAGNOSIS — N138 Other obstructive and reflux uropathy: Secondary | ICD-10-CM | POA: Diagnosis not present

## 2020-07-06 DIAGNOSIS — Z9181 History of falling: Secondary | ICD-10-CM | POA: Diagnosis not present

## 2020-07-06 DIAGNOSIS — R2689 Other abnormalities of gait and mobility: Secondary | ICD-10-CM | POA: Diagnosis not present

## 2020-07-07 DIAGNOSIS — I5032 Chronic diastolic (congestive) heart failure: Secondary | ICD-10-CM | POA: Diagnosis not present

## 2020-07-07 DIAGNOSIS — Z9181 History of falling: Secondary | ICD-10-CM | POA: Diagnosis not present

## 2020-07-07 DIAGNOSIS — M6281 Muscle weakness (generalized): Secondary | ICD-10-CM | POA: Diagnosis not present

## 2020-07-07 DIAGNOSIS — R1312 Dysphagia, oropharyngeal phase: Secondary | ICD-10-CM | POA: Diagnosis not present

## 2020-07-07 DIAGNOSIS — R2689 Other abnormalities of gait and mobility: Secondary | ICD-10-CM | POA: Diagnosis not present

## 2020-07-10 DIAGNOSIS — K219 Gastro-esophageal reflux disease without esophagitis: Secondary | ICD-10-CM | POA: Diagnosis not present

## 2020-07-10 DIAGNOSIS — R1312 Dysphagia, oropharyngeal phase: Secondary | ICD-10-CM | POA: Diagnosis not present

## 2020-07-11 DIAGNOSIS — K219 Gastro-esophageal reflux disease without esophagitis: Secondary | ICD-10-CM | POA: Diagnosis not present

## 2020-07-11 DIAGNOSIS — R1312 Dysphagia, oropharyngeal phase: Secondary | ICD-10-CM | POA: Diagnosis not present

## 2020-07-12 DIAGNOSIS — K219 Gastro-esophageal reflux disease without esophagitis: Secondary | ICD-10-CM | POA: Diagnosis not present

## 2020-07-12 DIAGNOSIS — R1312 Dysphagia, oropharyngeal phase: Secondary | ICD-10-CM | POA: Diagnosis not present

## 2020-07-13 DIAGNOSIS — R1312 Dysphagia, oropharyngeal phase: Secondary | ICD-10-CM | POA: Diagnosis not present

## 2020-07-13 DIAGNOSIS — K219 Gastro-esophageal reflux disease without esophagitis: Secondary | ICD-10-CM | POA: Diagnosis not present

## 2020-07-17 DIAGNOSIS — R1312 Dysphagia, oropharyngeal phase: Secondary | ICD-10-CM | POA: Diagnosis not present

## 2020-07-17 DIAGNOSIS — K219 Gastro-esophageal reflux disease without esophagitis: Secondary | ICD-10-CM | POA: Diagnosis not present

## 2020-07-18 DIAGNOSIS — R1312 Dysphagia, oropharyngeal phase: Secondary | ICD-10-CM | POA: Diagnosis not present

## 2020-07-18 DIAGNOSIS — K219 Gastro-esophageal reflux disease without esophagitis: Secondary | ICD-10-CM | POA: Diagnosis not present

## 2020-07-19 DIAGNOSIS — K219 Gastro-esophageal reflux disease without esophagitis: Secondary | ICD-10-CM | POA: Diagnosis not present

## 2020-07-19 DIAGNOSIS — R1312 Dysphagia, oropharyngeal phase: Secondary | ICD-10-CM | POA: Diagnosis not present

## 2020-07-20 DIAGNOSIS — R1312 Dysphagia, oropharyngeal phase: Secondary | ICD-10-CM | POA: Diagnosis not present

## 2020-07-20 DIAGNOSIS — K219 Gastro-esophageal reflux disease without esophagitis: Secondary | ICD-10-CM | POA: Diagnosis not present

## 2020-07-21 NOTE — Progress Notes (Deleted)
HPI: HPI: Garrett Hughes (s/p [id number]), pervious TAVR and atrial fibrillation. Not on anticoagulation as pt refused in past and had h/o GI bleed and urinary tract bleeding.Abdominal ultrasound February 2019 showed no aneurysm.  Cardiac catheterization March 2021 showed severe coronary disease with patent LIMA to the LAD and saphenous vein graft to the OM and atretic RIMA to RCA.   Had PCI of the RCA April 2021.  Procedure complicated by perforation requiring covered stent.  Carotid Dopplers May 2021 showed total occlusion of the right internal carotid artery and 80 to 99% left stenosis.  01/15/2020 showed occluded right and severe stenosis of the left internal carotid artery.  There was 70% stenosis of the right vertebral and 50% left.  Had TAVR May 2021.  Procedure complicated by CVA.  Significant residual deficits.  Had pacemaker placed May 2021.  Echocardiogram July 2021 showed normal LV function, previous TAVR with mean gradient 10 mmHg and no aortic insufficiency, biatrial enlargement, mild mitral regurgitation and mild to moderate tricuspid regurgitation; mildly dilated aortic root. since last seen   Current Outpatient Medications  Medication Sig Dispense Refill  . ACCU-CHEK SOFTCLIX LANCETS lancets Use as instructed to check blood sugar up to qid. Dx Code E11.22. 100 each 12  . acetaminophen (TYLENOL) 325 MG tablet Take 650 mg by mouth every 6 (six) hours as needed.    . Alcohol Swabs PADS Use as instructed to check blood sugar up to qid. Dx code E11.22 100 each 1  . AMBULATORY NON FORMULARY MEDICATION Medication Name: accu-chek aviva plus meter, accu-check aviva plus test strips & accu-chek softclix lancets to test twice a day. Dx: E11.29 1 vial PRN  . amoxicillin (AMOXIL) 500 MG capsule Take 4 tablets by mouth 1 hour prior to any dental work 4 capsule 3  . aspirin EC 81 MG tablet Take 1 tablet (81 mg total) by mouth daily. Swallow whole. (Patient not taking: Reported on 05/11/2020) 90 tablet 3  .  Blood Glucose Calibration (ACCU-CHEK AVIVA) SOLN Use as instructed per manufacturer directions. Dx code E11.22 1 each 0  . Blood Glucose Monitoring Suppl (ACCU-CHEK AVIVA PLUS) w/Device KIT Use as instructed to check blood sugar up to qid. Dx code E11.22 1 kit 0  . busPIRone (BUSPAR) 5 MG tablet Take 5 mg by mouth daily.    . calcium carbonate (TUMS - DOSED IN MG ELEMENTAL CALCIUM) 500 MG chewable tablet Chew 1 tablet by mouth daily.    . clopidogrel (PLAVIX) 75 MG tablet Take 75 mg by mouth daily.    . divalproex (DEPAKOTE) 125 MG DR tablet Take 125 mg by mouth in the morning and at bedtime.    . docusate sodium (COLACE) 100 MG capsule Take 100 mg by mouth 2 (two) times daily.    Marland Kitchen doxazosin (CARDURA) 4 MG tablet Take 4 mg by mouth daily.    . ferrous sulfate 325 (65 FE) MG EC tablet Take 1 tablet (325 mg total) by mouth daily with breakfast. 90 tablet 3  . furosemide (LASIX) 40 MG tablet Take 0.5 tablets (20 mg total) by mouth daily.    Marland Kitchen glipiZIDE (GLUCOTROL) 5 MG tablet TAKE 1 TABLET BY MOUTH  TWICE DAILY BEFORE MEALS 180 tablet 0  . glucose blood (ACCU-CHEK AVIVA PLUS) test strip Use as instructed to check blood sugar up to qid. Dx Code E11.22. Disp generic per pt preference / insurance coverage 100 each 99  . insulin aspart (NOVOLOG) 100 UNIT/ML injection Inject into the skin  2 (two) times daily before a meal.    . Lancets (ONETOUCH ULTRASOFT) lancets Use as instructed to check blood sugar up to qid. Dx: E11.22 100 each 99  . loratadine (LORADAMED) 10 MG tablet Take 10 mg by mouth daily.    . melatonin 5 MG TABS Take 5 mg by mouth at bedtime. (Patient not taking: Reported on 05/11/2020)    . metFORMIN (GLUCOPHAGE) 1000 MG tablet TAKE 1 TABLET BY MOUTH  TWICE DAILY WITH A MEAL 180 tablet 0  . metoprolol succinate (TOPROL-XL) 25 MG 24 hr tablet Take 1 tablet (25 mg total) by mouth daily. (Patient not taking: Reported on 05/11/2020) 90 tablet 0  . metoprolol tartrate (LOPRESSOR) 25 MG tablet Take 25  mg by mouth 2 (two) times daily.    . mirtazapine (REMERON) 15 MG tablet Take 15 mg by mouth at bedtime.    . nitroGLYCERIN (NITROSTAT) 0.4 MG SL tablet Place 1 tablet (0.4 mg total) under the tongue every 5 (five) minutes as needed for chest pain. (Patient not taking: Reported on 05/11/2020) 30 tablet 1  . pantoprazole (PROTONIX) 20 MG tablet TAKE 1 TABLET BY MOUTH  DAILY 90 tablet 0  . polyethylene glycol (MIRALAX / GLYCOLAX) 17 g packet Take 17 g by mouth daily.    . pravastatin (PRAVACHOL) 40 MG tablet Take 1 tablet (40 mg total) by mouth daily. 90 tablet 0  . traMADol (ULTRAM) 50 MG tablet Take by mouth 2 (two) times daily.     No current facility-administered medications for this visit.     Past Medical History:  Diagnosis Date  . Anemia   . Arthritis   . Atherosclerosis of native coronary artery of native heart without angina pectoris 01/05/2016  . Benign essential hypertension 01/05/2016  . Carotid artery disease ([place])   . Cataract, nuclear, right 05/06/2014  . Chronic atrial fibrillation ([place]) 12/20/2015  . Chronic diarrhea   . COPD (chronic obstructive pulmonary disease) (HCC)    on nocturnal O2  . Current use of long term anticoagulation 12/20/2015  . Diabetic kidney disease ([place]) 01/05/2016  . GERD (gastroesophageal reflux disease)   . GI bleed   . History of Barrett's esophagus 12/20/2015  . History of blood in urine   . History of kidney stones   . 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 Hyperlipemia 10/26/2015  . Low back pain 05/02/2016  . S/P TAVR (transcatheter aortic valve replacement) 01/18/2020   s/p successful TAVR w/ a 26 mm Edwards S3U via the left subclavian approach (failed Shockwave TF) with Dr. Burt Knack and Dr. Roxy Manns   . Severe aortic stenosis   . Thrombocytopenia ([place])   . Type 2 diabetes mellitus with kidney complication, without long-term current use of insulin ([place]) 07/03/2016    Past Surgical History:  Procedure  Laterality Date  . AORTOGRAM  01/18/2020   Procedure: Aortogram;  Surgeon: Sherren Mocha, MD;  Location: [place];  Service: Open Heart Surgery;;  . BACK SURGERY  2004  . [place]  . CORONARY ARTERY BYPASS GRAFT  1997  . CORONARY ATHERECTOMY N/A 12/24/2019   Procedure: CORONARY ATHERECTOMY;  Surgeon: Sherren Mocha, MD;  Location: [place] [facility];  Service: Cardiovascular;  Laterality: N/A;  . INTRAOPERATIVE TRANSESOPHAGEAL ECHOCARDIOGRAM  01/18/2020   Procedure: Intraoperative Transesophageal Echocardiogram;  Surgeon: Sherren Mocha, MD;  Location: [place];  Service: Open Heart Surgery;;  . INTRAOPERATIVE TRANSTHORACIC ECHOCARDIOGRAM N/A 01/18/2020  Procedure: Intraoperative Transthoracic Echocardiogram;  Surgeon: Sherren Mocha, MD;  Location: [place];  Service: Open Heart Surgery;  Laterality: N/A;  . LOWER EXTREMITY ANGIOGRAM  01/18/2020   Procedure: Lower Extremity Angiogram;  Surgeon: Sherren Mocha, MD;  Location: [place];  Service: Open Heart Surgery;;  Shockwave  . PACEMAKER IMPLANT N/A 01/24/2020   Procedure: PACEMAKER IMPLANT;  Surgeon: Deboraha Sprang, MD;  Location: [place] [facility];  Service: Cardiovascular;  Laterality: N/A;  . RIGHT HEART CATH AND CORONARY/GRAFT ANGIOGRAPHY N/A 11/26/2019   Procedure: RIGHT HEART CATH AND CORONARY/GRAFT ANGIOGRAPHY;  Surgeon: Sherren Mocha, MD;  Location: [place] [facility];  Service: Cardiovascular;  Laterality: N/A;  . SHOULDER SURGERY  2005  . TRANSCATHETER AORTIC VALVE REPLACEMENT, TRANSFEMORAL Left 01/18/2020   Procedure: Transcatheter Aortic Valve Replacement (subclavian approach);  Surgeon: Sherren Mocha, MD;  Location: [place];  Service: Open Heart Surgery;  Laterality: Left;    Social History   Socioeconomic History  . Marital status: Married    Spouse name: Edd Fabian  . Number of children: 2  . Years of education: 64  . Highest education level: 11th grade  Occupational History  . Occupation: truck Geophysicist/field seismologist    Comment:  retired  Tobacco Use  . Smoking status: Former Smoker    Quit date: 09/10/1987    Years since quitting: 32.8  . Smokeless tobacco: Never Used  Vaping Use  . Vaping Use: Never used  Substance and Sexual Activity  . Alcohol use: No  . Drug use: No  . Sexual activity: Not Currently  Other Topics Concern  . 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 Keeps grandsons and granddaughter   Social Determinants of Health   Financial Resource Strain:   . Difficulty of Paying Living Expenses: Not on file  Food Insecurity: No Food Insecurity  . Worried About Charity fundraiser in the Last Year: Never true  . Ran Out of Food in the Last Year: Never true  Transportation Needs: No Transportation Needs  . Lack of Transportation (Medical): No  . Lack of Transportation (Non-Medical): No  Physical Activity:   . Days of Exercise per Week: Not on file  . Minutes of Exercise per Session: Not on file  Stress:   . Feeling of Stress : Not on file  Social Connections:   . Frequency of Communication with Friends and Family: Not on file  . Frequency of Social Gatherings with Friends and Family: Not on file  . Attends Religious Services: Not on file  . Active Member of Clubs or Organizations: Not on file  . Attends Archivist Meetings: Not on file  . Marital Status: Not on file  Intimate Partner Violence:   . Fear of Current or Ex-Partner: Not on file  . Emotionally Abused: Not on file  . Physically Abused: Not on file  . Sexually Abused: Not on file    Family History  Problem Relation Age of Onset  . CAD Father   . Colon cancer Neg Hx   . Heart attack Neg Hx     ROS: no fevers or chills, productive cough, hemoptysis, dysphasia, odynophagia, melena, hematochezia, dysuria, hematuria, rash, seizure activity, orthopnea, PND, pedal edema, claudication. Remaining systems are negative.  Physical  Exam: Well-developed well-nourished in no acute distress.  Skin is warm and dry.  HEENT is normal.  Neck is supple.  Chest is clear to auscultation with normal expansion.  Cardiovascular exam is regular rate and rhythm.  Abdominal exam nontender or distended. No masses palpated. Extremities show no edema. neuro grossly intact  ECG- personally reviewed  A/P  1 status post TAVR-plan to continue SBE prophylaxis.  Most recent echocardiogram showed normal mean gradient and no aortic insufficiency.  2 coronary artery disease status post coronary artery bypass graft and recent PCI of RCA-continue aspirin, Plavix and statin.  3 permanent atrial fibrillation-continue beta-blocker.  Patient declines anticoagulation and understands the higher risk of CVA.  4 hypertension-patient's blood pressure is controlled today.  Continue present medications and follow.  5 hyperlipidemia-continue statin.  He did not tolerate higher doses of pravastatin or Lipitor previously.  6 status post CVA  7 status post pacemaker-follow-up electrophysiology.  8 carotid artery disease-continue aspirin, Plavix and statin.  Patient is followed by vascular surgery.  Patient and wife do not want any further procedures.  Kirk Ruths, MD

## 2020-07-26 ENCOUNTER — Ambulatory Visit: Payer: Medicare Other | Admitting: Cardiology

## 2020-08-04 DIAGNOSIS — I739 Peripheral vascular disease, unspecified: Secondary | ICD-10-CM | POA: Diagnosis not present

## 2020-08-04 DIAGNOSIS — I679 Cerebrovascular disease, unspecified: Secondary | ICD-10-CM | POA: Diagnosis not present

## 2020-08-04 DIAGNOSIS — I2510 Atherosclerotic heart disease of native coronary artery without angina pectoris: Secondary | ICD-10-CM | POA: Diagnosis not present

## 2020-08-09 ENCOUNTER — Ambulatory Visit (INDEPENDENT_AMBULATORY_CARE_PROVIDER_SITE_OTHER): Payer: Medicare Other

## 2020-08-09 DIAGNOSIS — I442 Atrioventricular block, complete: Secondary | ICD-10-CM | POA: Diagnosis not present

## 2020-08-11 LAB — CUP PACEART REMOTE DEVICE CHECK
Battery Remaining Longevity: 79 mo
Battery Remaining Percentage: 95.5 %
Battery Voltage: 3.01 V
Brady Statistic RV Percent Paced: 99 %
Date Time Interrogation Session: 20211201234658
Implantable Lead Implant Date: 20210517
Implantable Lead Location: 753860
Implantable Pulse Generator Implant Date: 20210517
Lead Channel Impedance Value: 450 Ohm
Lead Channel Pacing Threshold Amplitude: 0.75 V
Lead Channel Pacing Threshold Pulse Width: 0.5 ms
Lead Channel Sensing Intrinsic Amplitude: 12 mV
Lead Channel Setting Pacing Amplitude: 3.5 V
Lead Channel Setting Pacing Pulse Width: 0.5 ms
Lead Channel Setting Sensing Sensitivity: 4 mV
Pulse Gen Model: 1272
Pulse Gen Serial Number: 3805346

## 2020-08-16 NOTE — Progress Notes (Signed)
Remote pacemaker transmission.   

## 2020-08-30 ENCOUNTER — Telehealth: Payer: Self-pay

## 2020-08-30 DIAGNOSIS — Z952 Presence of prosthetic heart valve: Secondary | ICD-10-CM

## 2020-08-30 DIAGNOSIS — I5033 Acute on chronic diastolic (congestive) heart failure: Secondary | ICD-10-CM

## 2020-08-30 DIAGNOSIS — G8929 Other chronic pain: Secondary | ICD-10-CM

## 2020-08-30 DIAGNOSIS — J439 Emphysema, unspecified: Secondary | ICD-10-CM

## 2020-08-30 DIAGNOSIS — I6340 Cerebral infarction due to embolism of unspecified cerebral artery: Secondary | ICD-10-CM

## 2020-08-30 DIAGNOSIS — M545 Low back pain, unspecified: Secondary | ICD-10-CM

## 2020-08-30 NOTE — Telephone Encounter (Signed)
Rx sent 

## 2020-08-30 NOTE — Telephone Encounter (Signed)
Pt's wife called requesting a rx for a wheelchair for pt. She stated that the patient has limited mobility. Rx pended.

## 2020-08-31 NOTE — Telephone Encounter (Signed)
Task completed. Pt's wife ([place]) has been updated that order for wheelchair sent to [facility]. Pt's wife is aware supply company will contact her with inquiries if needed.

## 2020-09-05 ENCOUNTER — Telehealth: Payer: Self-pay | Admitting: General Practice

## 2020-09-13 NOTE — Telephone Encounter (Signed)
Documentation

## 2020-09-18 DIAGNOSIS — E119 Type 2 diabetes mellitus without complications: Secondary | ICD-10-CM | POA: Diagnosis not present

## 2020-09-18 DIAGNOSIS — I5032 Chronic diastolic (congestive) heart failure: Secondary | ICD-10-CM | POA: Diagnosis not present

## 2020-09-18 DIAGNOSIS — I110 Hypertensive heart disease with heart failure: Secondary | ICD-10-CM | POA: Diagnosis not present

## 2020-09-18 DIAGNOSIS — I482 Chronic atrial fibrillation, unspecified: Secondary | ICD-10-CM | POA: Diagnosis not present

## 2020-09-18 DIAGNOSIS — R600 Localized edema: Secondary | ICD-10-CM | POA: Diagnosis not present

## 2020-09-21 DIAGNOSIS — E119 Type 2 diabetes mellitus without complications: Secondary | ICD-10-CM | POA: Diagnosis not present

## 2020-09-21 DIAGNOSIS — I110 Hypertensive heart disease with heart failure: Secondary | ICD-10-CM | POA: Diagnosis not present

## 2020-09-21 DIAGNOSIS — I272 Pulmonary hypertension, unspecified: Secondary | ICD-10-CM | POA: Diagnosis not present

## 2020-09-21 DIAGNOSIS — D509 Iron deficiency anemia, unspecified: Secondary | ICD-10-CM | POA: Diagnosis not present

## 2020-09-21 DIAGNOSIS — E785 Hyperlipidemia, unspecified: Secondary | ICD-10-CM | POA: Diagnosis not present

## 2020-09-21 DIAGNOSIS — E639 Nutritional deficiency, unspecified: Secondary | ICD-10-CM | POA: Diagnosis not present

## 2020-09-28 DIAGNOSIS — N138 Other obstructive and reflux uropathy: Secondary | ICD-10-CM | POA: Diagnosis not present

## 2020-09-28 DIAGNOSIS — N390 Urinary tract infection, site not specified: Secondary | ICD-10-CM | POA: Diagnosis not present

## 2020-10-03 DIAGNOSIS — L600 Ingrowing nail: Secondary | ICD-10-CM | POA: Diagnosis not present

## 2020-10-03 DIAGNOSIS — L603 Nail dystrophy: Secondary | ICD-10-CM | POA: Diagnosis not present

## 2020-10-03 DIAGNOSIS — B351 Tinea unguium: Secondary | ICD-10-CM | POA: Diagnosis not present

## 2020-10-04 DIAGNOSIS — R5381 Other malaise: Secondary | ICD-10-CM | POA: Diagnosis not present

## 2020-10-04 DIAGNOSIS — R5383 Other fatigue: Secondary | ICD-10-CM | POA: Diagnosis not present

## 2020-10-04 DIAGNOSIS — R6883 Chills (without fever): Secondary | ICD-10-CM | POA: Diagnosis not present

## 2020-10-04 DIAGNOSIS — R059 Cough, unspecified: Secondary | ICD-10-CM | POA: Diagnosis not present

## 2020-10-04 DIAGNOSIS — J449 Chronic obstructive pulmonary disease, unspecified: Secondary | ICD-10-CM | POA: Diagnosis not present

## 2020-10-25 ENCOUNTER — Other Ambulatory Visit: Payer: Self-pay

## 2020-10-25 NOTE — [facility] Notes (Unsigned)
Cardiology Clinic Note   Patient Name: Garrett Hughes Date of Encounter: 10/26/2020  Primary Care Provider:  Emeterio Reeve, DO Primary Cardiologist:  Garrett Ruths, MD  Patient Profile    Garrett Hughes 80 year old male presents to the clinic today for follow-up evaluation of his coronary artery disease status post CABG 1997 and aortic stenosis status post TAVR.  Past Medical History    Past Medical History:  Diagnosis Date  . Anemia   . Arthritis   . Atherosclerosis of native coronary artery of native heart without angina pectoris 01/05/2016  . Benign essential hypertension 01/05/2016  . Carotid artery disease ([place])   . Cataract, nuclear, right 05/06/2014  . Chronic atrial fibrillation ([place]) 12/20/2015  . Chronic diarrhea   . COPD (chronic obstructive pulmonary disease) (HCC)    on nocturnal O2  . Current use of long term anticoagulation 12/20/2015  . Diabetic kidney disease ([place]) 01/05/2016  . GERD (gastroesophageal reflux disease)   . GI bleed   . History of Barrett's esophagus 12/20/2015  . History of blood in urine   . History of kidney stones   . 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 Hyperlipemia 10/26/2015  . Low back pain 05/02/2016  . S/P TAVR (transcatheter aortic valve replacement) 01/18/2020   s/p successful TAVR w/ a 26 mm Edwards S3U via the left subclavian approach (failed Shockwave TF) with Dr. Burt Knack and Dr. Roxy Manns   . Severe aortic stenosis   . Thrombocytopenia ([place])   . Type 2 diabetes mellitus with kidney complication, without long-term current use of insulin ([place]) 07/03/2016   Past Surgical History:  Procedure Laterality Date  . AORTOGRAM  01/18/2020   Procedure: Aortogram;  Surgeon: Sherren Mocha, MD;  Location: [place];  Service: Open Heart Surgery;;  . BACK SURGERY  2004  . [place]  . CORONARY ARTERY BYPASS GRAFT  1997  . CORONARY ATHERECTOMY N/A 12/24/2019   Procedure: CORONARY ATHERECTOMY;   Surgeon: Sherren Mocha, MD;  Location: [place] [facility];  Service: Cardiovascular;  Laterality: N/A;  . INTRAOPERATIVE TRANSESOPHAGEAL ECHOCARDIOGRAM  01/18/2020   Procedure: Intraoperative Transesophageal Echocardiogram;  Surgeon: Sherren Mocha, MD;  Location: [place];  Service: Open Heart Surgery;;  . INTRAOPERATIVE TRANSTHORACIC ECHOCARDIOGRAM N/A 01/18/2020   Procedure: Intraoperative Transthoracic Echocardiogram;  Surgeon: Sherren Mocha, MD;  Location: [place];  Service: Open Heart Surgery;  Laterality: N/A;  . LOWER EXTREMITY ANGIOGRAM  01/18/2020   Procedure: Lower Extremity Angiogram;  Surgeon: Sherren Mocha, MD;  Location: [facility];  Service: Open Heart Surgery;;  Shockwave  . PACEMAKER IMPLANT N/A 01/24/2020   Procedure: PACEMAKER IMPLANT;  Surgeon: Deboraha Sprang, MD;  Location: [place] [facility];  Service: Cardiovascular;  Laterality: N/A;  . RIGHT HEART CATH AND CORONARY/GRAFT ANGIOGRAPHY N/A 11/26/2019   Procedure: RIGHT HEART CATH AND CORONARY/GRAFT ANGIOGRAPHY;  Surgeon: Sherren Mocha, MD;  Location: [place] [facility];  Service: Cardiovascular;  Laterality: N/A;  . SHOULDER SURGERY  2005  . TRANSCATHETER AORTIC VALVE REPLACEMENT, TRANSFEMORAL Left 01/18/2020   Procedure: Transcatheter Aortic Valve Replacement (subclavian approach);  Surgeon: Sherren Mocha, MD;  Location: [place];  Service: Open Heart Surgery;  Laterality: Left;    Allergies  Allergies  Allergen Reactions  . Atorvastatin     Other reaction(s): Myalgias (intolerance)  . Cholestyramine Other (See Comments)    Constipation  . Pravastatin Other (See Comments)    Does not tolerate  high doses    History of Present Illness    [name] has a PMH of coronary artery disease status post CABG 1997, PCI of his RCA 12/24/2019, RBBB, chronic atrial fibrillation patient declines anticoagulation, high-grade carotid artery disease, hypertension, diabetes mellitus type 2, hyperlipidemia, chronic anemia/thrombocytopenia,  GERD, Barrett's esophagus with history of GI bleed and urinary tract bleeding.  PMH also includes severe AS status post TAVR 01/18/2020.  He was seen virtually 11/25/2019 for follow-up and stated he felt well.  He stated he continued to have shortness of breath with minimal exertion.  He had not been able to walk any distance for the last 6 months without developing dyspnea.  He stated that when he would get  up and move he would use a very slow pace.  He did not have any shortness of breath without activity.  He did not take his glipizide and had been taking his Lasix as prescribed.  He was given the opportunity to ask questions and all questions were answered.  He and his wife agree to proceed with the cardiac catheterization and TAVR procedure.  He underwent TAVR 01/18/2020.  He presents the clinic today for follow-up evaluation states he has noticed some increased work of breathing with increased swelling in his lower extremities.  He continues to stay at [facility] and reports he is receiving good care there.  He is increasing his physical activity.  He reports that he does enjoy salt on his food.  He hopes to return home this spring.  I will prescribe extra furosemide and extra potassium for the next 3 days.  I will have him return for follow-up BMP and follow-up evaluation in 1 week.  I will give the salty 6 diet sheet and have him increase his physical activity as tolerated.  Today he denies chest pain, increased shortness of breath, lower extremity edema, fatigue, palpitations, melena, hematuria, hemoptysis, diaphoresis, weakness, presyncope, syncope, orthopnea, and PND.  Home Medications    Prior to Admission medications   Medication Sig Start Date End Date Taking? Authorizing Provider  ACCU-CHEK SOFTCLIX LANCETS lancets Use as instructed to check blood sugar up to qid. Dx Code E11.22. 06/02/18   Hali Marry, MD  acetaminophen (TYLENOL) 325 MG tablet Take 650 mg by mouth every 6  (six) hours as needed.    [provider]  Alcohol Swabs PADS Use as instructed to check blood sugar up to qid. Dx code E11.22 06/02/18   Garrett Reeve, DO  AMBULATORY NON FORMULARY MEDICATION Medication Name: accu-chek aviva plus meter, accu-check aviva plus test strips & accu-chek softclix lancets to test twice a day. Dx: E11.29 06/03/18   Hali Marry, MD  amoxicillin (AMOXIL) 500 MG capsule Take 4 tablets by mouth 1 hour prior to any dental work 03/16/20   Eileen [place], PA-C  aspirin EC 81 MG tablet Take 1 tablet (81 mg total) by mouth daily. Swallow whole. Patient not taking: Reported on 05/11/2020 03/16/20   Eileen [place], PA-C  Blood Glucose Calibration (ACCU-CHEK AVIVA) SOLN Use as instructed per manufacturer directions. Dx code E11.22 06/02/18   Garrett Reeve, DO  Blood Glucose Monitoring Suppl (ACCU-CHEK AVIVA PLUS) w/Device KIT Use as instructed to check blood sugar up to qid. Dx code E11.22 06/02/18   Hali Marry, MD  busPIRone (BUSPAR) 5 MG tablet Take 5 mg by mouth daily.    [provider]  calcium carbonate (TUMS - DOSED IN MG ELEMENTAL CALCIUM) 500 MG chewable tablet  Chew 1 tablet by mouth daily.    [provider]  clopidogrel (PLAVIX) 75 MG tablet Take 75 mg by mouth daily.    [provider]  divalproex (DEPAKOTE) 125 MG DR tablet Take 125 mg by mouth in the morning and at bedtime.    [provider]  docusate sodium (COLACE) 100 MG capsule Take 100 mg by mouth 2 (two) times daily.    [provider]  doxazosin (CARDURA) 4 MG tablet Take 4 mg by mouth daily.    [provider]  ferrous sulfate 325 (65 FE) MG EC tablet Take 1 tablet (325 mg total) by mouth daily with breakfast. 09/14/18   Garrett Reeve, DO  furosemide (LASIX) 40 MG tablet Take 0.5 tablets (20 mg total) by mouth daily. 12/26/19   [place], Nedra Hai, PA-C  glipiZIDE (GLUCOTROL) 5 MG tablet TAKE 1 TABLET BY MOUTH  TWICE DAILY  BEFORE MEALS 01/10/20   Garrett Reeve, DO  glucose blood (ACCU-CHEK AVIVA PLUS) test strip Use as instructed to check blood sugar up to qid. Dx Code E11.22. Disp generic per pt preference / insurance coverage 06/15/19   Garrett Reeve, DO  guaiFENesin (MUCINEX) 600 MG 12 hr tablet Take 600 mg by mouth daily as needed. 10/18/20 10/25/20  [provider]  insulin aspart (NOVOLOG) 100 UNIT/ML injection Inject into the skin 2 (two) times daily before a meal.    [provider]  Lancets (ONETOUCH ULTRASOFT) lancets Use as instructed to check blood sugar up to qid. Dx: E11.22 11/06/17   Garrett Reeve, DO  loratadine (LORADAMED) 10 MG tablet Take 10 mg by mouth daily.    [provider]  melatonin 5 MG TABS Take 5 mg by mouth at bedtime. Patient not taking: Reported on 05/11/2020    [provider]  metFORMIN (GLUCOPHAGE) 1000 MG tablet TAKE 1 TABLET BY MOUTH  TWICE DAILY WITH A MEAL 01/10/20   Garrett Reeve, DO  metoprolol succinate (TOPROL-XL) 25 MG 24 hr tablet Take 1 tablet (25 mg total) by mouth daily. Patient not taking: Reported on 05/11/2020 01/10/20   Garrett Reeve, DO  metoprolol tartrate (LOPRESSOR) 25 MG tablet Take 25 mg by mouth 2 (two) times daily.    [provider]  mirtazapine (REMERON) 15 MG tablet Take 15 mg by mouth at bedtime.    [provider]  nitroGLYCERIN (NITROSTAT) 0.4 MG SL tablet Place 1 tablet (0.4 mg total) under the tongue every 5 (five) minutes as needed for chest pain. Patient not taking: Reported on 05/11/2020 09/14/18   Garrett Reeve, DO  pantoprazole (PROTONIX) 20 MG tablet TAKE 1 TABLET BY MOUTH  DAILY 01/10/20   Garrett Reeve, DO  polyethylene glycol (MIRALAX / GLYCOLAX) 17 g packet Take 17 g by mouth daily.    [provider]  potassium chloride (MICRO-K) 10 MEQ CR capsule Take 10 mEq by mouth daily. 10/23/20   [provider]  pravastatin (PRAVACHOL) 40 MG tablet Take 1 tablet (40  mg total) by mouth daily. 01/10/20   Garrett Reeve, DO  traMADol (ULTRAM) 50 MG tablet Take by mouth 2 (two) times daily.    [provider]    Family History    Family History  Problem Relation Age of Onset  . CAD Father   . Colon cancer Neg Hx   . Heart attack Neg Hx    He indicated that his mother is deceased. He indicated that his father is deceased. He indicated that his maternal grandmother  is deceased. He indicated that his maternal grandfather is deceased. He indicated that his paternal grandmother is deceased. He indicated that his paternal grandfather is deceased. He indicated that the status of his neg hx is unknown.  Social History    Social History   Socioeconomic History  . Marital status: Married    Spouse name: Edd Fabian  . Number of children: 2  . Years of education: 59  . Highest education level: 11th grade  Occupational History  . Occupation: truck Geophysicist/field seismologist    Comment: retired  Tobacco Use  . Smoking status: Former Smoker    Quit date: 09/10/1987    Years since quitting: 33.1  . Smokeless tobacco: Never Used  Vaping Use  . Vaping Use: Never used  Substance and Sexual Activity  . Alcohol use: No  . Drug use: No  . Sexual activity: Not Currently  Other Topics Concern  . 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 Keeps grandsons and granddaughter   Social Determinants of Health   Financial Resource Strain: Not on file  Food Insecurity: No Food Insecurity  . Worried About Charity fundraiser in the Last Year: Never true  . Ran Out of Food in the Last Year: Never true  Transportation Needs: No Transportation Needs  . Lack of Transportation (Medical): No  . Lack of Transportation (Non-Medical): No  Physical Activity: Not on file  Stress: Not on file  Social Connections: Not on file  Intimate Partner Violence: Not on file     Review of Systems    General:   No chills, fever, night sweats or weight changes.  Cardiovascular:  No chest pain, dyspnea on exertion, edema, orthopnea, palpitations, paroxysmal nocturnal dyspnea. Dermatological: No rash, lesions/masses Respiratory: No cough, dyspnea Urologic: No hematuria, dysuria Abdominal:   No nausea, vomiting, diarrhea, bright red blood per rectum, melena, or hematemesis Neurologic:  No visual changes, wkns, changes in mental status. All other systems reviewed and are otherwise negative except as noted above.  Physical Exam    VS:  BP 127/66   Pulse 60   Ht '5\' 6"'  (1.676 Garrett)   Wt 125 lb (56.7 kg)   SpO2 96%   BMI 20.18 kg/Garrett  , BMI Body mass index is 20.18 kg/Garrett. GEN: Well nourished, well developed, in no acute distress. HEENT: normal. Neck: Supple, no JVD, carotid bruits, or masses. Cardiac: RRR, no murmurs, rubs, or gallops. No clubbing, cyanosis, edema.  Radials/DP/PT 2+ and equal bilaterally.  Respiratory:  Respirations regular and unlabored, clear to auscultation bilaterally. GI: Soft, nontender, nondistended, BS + x 4. MS: no deformity or atrophy. Skin: warm and dry, no rash. Neuro:  Strength and sensation are intact. Psych: Normal affect.  Accessory Clinical Findings    Recent Labs: 12/28/2019: TSH 3.429 01/13/2020: ALT 16; B Natriuretic Peptide 416.5 01/20/2020: Magnesium 1.9 01/26/2020: BUN 23; Creatinine, Ser 1.17; Hemoglobin 10.8; Platelets 226; Potassium 4.0; Sodium 141   Recent Lipid Panel    Component Value Date/Time   CHOL 141 01/23/2020 0536   TRIG 83 01/23/2020 0536   HDL 51 01/23/2020 0536   CHOLHDL 2.8 01/23/2020 0536   VLDL 17 01/23/2020 0536   LDLCALC 73 01/23/2020 0536   LDLCALC 67 09/21/2019 0922    ECG personally reviewed by me today-V paced rhythm 60 bpm- No acute changes  Echocardiogram 03/16/2020 IMPRESSIONS    1. S/P TAVR  with mean gradient 10 mmHg, AVA 1.6 cm2 and no AI.  2. Left ventricular ejection fraction, by estimation, is 55 to 60%. The   left ventricle has normal function. The left ventricle has no regional  wall motion abnormalities. Left ventricular diastolic parameters are  indeterminate.  3. Right ventricular systolic function is normal. The right ventricular  size is mildly enlarged.  4. Left atrial size was moderately dilated.  5. Right atrial size was mildly dilated.  6. The mitral valve is normal in structure. Mild mitral valve  regurgitation. No evidence of mitral stenosis.  7. Tricuspid valve regurgitation is mild to moderate.  8. The aortic valve has been repaired/replaced. Aortic valve  regurgitation is not visualized. No aortic stenosis is present.  9. Aortic dilatation noted. There is mild dilatation of the ascending  aorta measuring 39 mm.    Assessment & Plan   1.  Bilateral lower extremity edema-bilateral lower extremity +1 pitting edema.  Compliant with lower extremity support stockings.  Reports compliance with furosemide Increase furosemide to 40 mg x 3 days then return to 20 Increase potassium to 20 mEq x 3 days then return to 10 Heart healthy low-sodium diet as salty 6 given Order BMP in 1 week   Aortic stenosis status post TAVR -continues to live at [facility].  Continues to be wheelchair-bound and unable to perform his own ADLs. TAVR procedure discussed on 10/20/2019 visit.    He underwent TAVR 7/82/9562 which was complicated by balloon rupture during valve deployment and axillary artery damage which was successfully repaired.  There was no significant valvular injury by fluoroscopy.  His postoperative echocardiogram showed an EF of 70%, normally functioning TAVR.  Moderate-severe TR and pulmonary hypertension were noted.  He developed profound bradycardia with a junctional rhythm requiring PPM 01/24/2020.  He also developed left arm weakness and AMS postoperatively.  His head CT was negative.  MRI head showed numerous small acute infarcts in the right greater and left cerebral hemispheres in  the left cerebellum compatible with central embolic source.  Carotid Doppler showed total occlusion of his right ICA.  Neurology recommended aspirin 325 daily and Plavix. VVS recommended no revascularization of R ICA but would need left carotid intervention via carotid endarterectomy or TCAR.  He also required transfusion due to worsening anemia.  He was transferred to SNF at discharge due to his inability to provide his own ADLs. Continue aspirin, Plavix, metoprolol, amoxicillin as needed prior to dental work. Heart healthy low-sodium diet Increase physical activity as tolerated Repeat echocardiogram 7/22  CAD-no chest pain today.  History of CABG 1997.  He underwent cardiac catheterization 11/26/2019 which showed severe multivessel coronary artery disease with chronic occlusion of the left coronary artery and high-grade stenosis of the RCA.  His LIMA-LAD was patent and his SVG-obtuse marginal was patent.  His RIMA-RCA was patent.  He went back to the cardiac Cath Lab 12/24/19 and received PCI to the RCA which was complicated by coronary perforation requiring stenting.  Follow-up echocardiogram showed no pericardial effusion.  He was discharged from the hospital readmitted 12/28/2019 with complaints of syncopal episode.  This was felt to be related to his aortic stenosis, atrial fibrillation and dehydration. Continue clopidogrel, metoprolol succinate, nitroglycerin, pravastatin  Heart healthy low-sodium diet Increase physical activity as tolerated  PPM-V paced 60 bpm.   Continue metoprolol , aspirin, clopidogrel Avoid triggers caffeine, chocolate, EtOH, etc.  Essential hypertension-BP today  127/66. Continue metoprolol, furosemide , doxazosin  Heart healthy low-sodium diet-salty 6  given Increase physical activity as tolerated  Hyperlipidemia- 01/23/2020: Cholesterol 141; HDL 51; LDL Cholesterol 73; Triglycerides 83; VLDL 17 Continue pravastatin 40 mg tablet daily Heart healthy low-sodium  high-fiber diet Increase physical activity as tolerated  Dilated ascending aorta-no recent episodes of chest or back pain.  Echocardiogram 03/16/2020 showed ascending aorta measuring 39 mm. Continue metoprolol, furosemide, doxazosin Repeat echocardiogram 7/22  Disposition: Follow-up with Dr. Stanford Breed or me in 1 week.   Jossie Ng. Blade Scheff NP-C    10/26/2020, 11:23 AM [place] [address] Office [phone] Fax [phone]  Notice: This dictation was prepared with Dragon dictation along with smaller phrase technology. Any transcriptional errors that result from this process are unintentional and may not be corrected upon review.  I spent 15 minutes examining this patient, reviewing medications, and using patient centered shared decision making involving her cardiac care.  Prior to her visit I spent greater than 20 minutes reviewing her past medical history,  medications, and prior cardiac tests.

## 2020-10-26 ENCOUNTER — Encounter: Payer: Self-pay | Admitting: General Practice

## 2020-10-26 ENCOUNTER — Ambulatory Visit (INDEPENDENT_AMBULATORY_CARE_PROVIDER_SITE_OTHER): Payer: Medicare Other | Admitting: General Practice

## 2020-10-26 ENCOUNTER — Other Ambulatory Visit: Payer: Self-pay

## 2020-10-26 VITALS — BP 127/66 | HR 60 | Ht 66.0 in | Wt 125.0 lb

## 2020-10-26 DIAGNOSIS — I10 Essential (primary) hypertension: Secondary | ICD-10-CM

## 2020-10-26 DIAGNOSIS — I2510 Atherosclerotic heart disease of native coronary artery without angina pectoris: Secondary | ICD-10-CM

## 2020-10-26 DIAGNOSIS — I77810 Thoracic aortic ectasia: Secondary | ICD-10-CM

## 2020-10-26 DIAGNOSIS — I482 Chronic atrial fibrillation, unspecified: Secondary | ICD-10-CM

## 2020-10-26 DIAGNOSIS — E7800 Pure hypercholesterolemia, unspecified: Secondary | ICD-10-CM

## 2020-10-26 DIAGNOSIS — R600 Localized edema: Secondary | ICD-10-CM

## 2020-10-26 DIAGNOSIS — Z952 Presence of prosthetic heart valve: Secondary | ICD-10-CM

## 2020-10-26 DIAGNOSIS — Z79899 Other long term (current) drug therapy: Secondary | ICD-10-CM

## 2020-10-26 MED ORDER — POTASSIUM CHLORIDE ER 10 MEQ PO CPCR: 10.0000 meq | ORAL_CAPSULE | Freq: Every day | ORAL | 6 refills | Status: DC

## 2020-10-26 MED ORDER — FUROSEMIDE 40 MG PO TABS: 20.0000 mg | ORAL_TABLET | Freq: Every day | ORAL | Status: DC

## 2020-10-26 NOTE — Patient Instructions (Addendum)
Medication Instructions:  FUROSEMIDE Take 40mg  X3 DAYS THEN BACK TO 20MG   POTASSIUM TAKE 20MEQx3 DAYS THEN BACK TO *If you need a refill on your cardiac medications before your next appointment, please call your pharmacy*  Lab Work: BMET IN 1 WEEK If you have labs (blood work) drawn today and your tests are completely normal, you will receive your results only by:  MyChart Message (if you have MyChart) OR A paper copy in the mail.  If you have any lab test that is abnormal or we need to change your treatment, we will call you to review the results. You may go to any [facility] that is convenient for you however, we do have a lab in our office that is able to assist you. You DO NOT need an appointment for our lab. The lab is open 8:00am and closes at 4:00pm. Lunch 12:45 - 1:45pm.  Special Instructions PLEASE READ AND FOLLOW SALTY 6-ATTACHED-1,800 mg daily  Follow-Up: Your next appointment:  1 week(s) In Person with , MD OR IF UNAVAILABLE JESSE CLEAVER, FNP-C  At [facility], you and your health needs are our priority.  As part of our continuing mission to provide you with exceptional heart care, we have created designated Provider Care Teams.  These Care Teams include your primary Cardiologist (physician) and Advanced Practice Providers (APPs -  Physician Assistants and Nurse Practitioners) who all work together to provide you with the care you need, when you need it.

## 2020-10-31 NOTE — Progress Notes (Signed)
Cardiology Clinic Note   Patient Name: Garrett Hughes Date of Encounter: 11/02/2020  Primary Care Provider:  Emeterio Reeve, DO Primary Cardiologist:  Kirk Ruths, MD  Patient Profile    Garrett Hughes 80 year old male presents to the clinic today for follow-up evaluation of his lower extremity edema.   Past Medical History    Past Medical History:  Diagnosis Date  . Anemia   . Arthritis   . Atherosclerosis of native coronary artery of native heart without angina pectoris 01/05/2016  . Benign essential hypertension 01/05/2016  . Carotid artery disease ([place])   . Cataract, nuclear, right 05/06/2014  . Chronic atrial fibrillation ([place]) 12/20/2015  . Chronic diarrhea   . COPD (chronic obstructive pulmonary disease) (HCC)    on nocturnal O2  . Current use of long term anticoagulation 12/20/2015  . Diabetic kidney disease ([place]) 01/05/2016  . GERD (gastroesophageal reflux disease)   . GI bleed   . History of Barrett's esophagus 12/20/2015  . History of blood in urine   . History of kidney stones   . 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 Hyperlipemia 10/26/2015  . Low back pain 05/02/2016  . S/P TAVR (transcatheter aortic valve replacement) 01/18/2020   s/p successful TAVR w/ a 26 mm Edwards S3U via the left subclavian approach (failed Shockwave TF) with Dr. Burt Knack and Dr. Roxy Manns   . Severe aortic stenosis   . Thrombocytopenia ([place])   . Type 2 diabetes mellitus with kidney complication, without long-term current use of insulin ([place]) 07/03/2016   Past Surgical History:  Procedure Laterality Date  . AORTOGRAM  01/18/2020   Procedure: Aortogram;  Surgeon: Sherren Mocha, MD;  Location: [facility];  Service: Open Heart Surgery;;  . BACK SURGERY  2004  . [place]  . CORONARY ARTERY BYPASS GRAFT  1997  . CORONARY ATHERECTOMY N/A 12/24/2019   Procedure: CORONARY ATHERECTOMY;  Surgeon: Sherren Mocha, MD;  Location: [facility];  Service: Cardiovascular;  Laterality: N/A;  . INTRAOPERATIVE TRANSESOPHAGEAL ECHOCARDIOGRAM  01/18/2020   Procedure: Intraoperative Transesophageal Echocardiogram;  Surgeon: Sherren Mocha, MD;  Location: [place];  Service: Open Heart Surgery;;  . INTRAOPERATIVE TRANSTHORACIC ECHOCARDIOGRAM N/A 01/18/2020   Procedure: Intraoperative Transthoracic Echocardiogram;  Surgeon: Sherren Mocha, MD;  Location: [place];  Service: Open Heart Surgery;  Laterality: N/A;  . LOWER EXTREMITY ANGIOGRAM  01/18/2020   Procedure: Lower Extremity Angiogram;  Surgeon: Sherren Mocha, MD;  Location: [place];  Service: Open Heart Surgery;;  Shockwave  . PACEMAKER IMPLANT N/A 01/24/2020   Procedure: PACEMAKER IMPLANT;  Surgeon: Deboraha Sprang, MD;  Location: [place] [facility];  Service: Cardiovascular;  Laterality: N/A;  . RIGHT HEART CATH AND CORONARY/GRAFT ANGIOGRAPHY N/A 11/26/2019   Procedure: RIGHT HEART CATH AND CORONARY/GRAFT ANGIOGRAPHY;  Surgeon: Sherren Mocha, MD;  Location: [place] [facility];  Service: Cardiovascular;  Laterality: N/A;  . SHOULDER SURGERY  2005  . TRANSCATHETER AORTIC VALVE REPLACEMENT, TRANSFEMORAL Left 01/18/2020   Procedure: Transcatheter Aortic Valve Replacement (subclavian approach);  Surgeon: Sherren Mocha, MD;  Location: [facility];  Service: Open Heart Surgery;  Laterality: Left;    Allergies  Allergies  Allergen Reactions  . Atorvastatin     Other reaction(s): Myalgias (intolerance)  . Cholestyramine Other (See Comments)    Constipation  . Pravastatin Other (See Comments)    Does not tolerate high doses    History of Present Illness  [name] has a PMH of coronary artery disease status post CABG 1997, PCI of his RCA 12/24/2019, RBBB, chronic atrial fibrillation patient declines anticoagulation, high-grade carotid artery disease, hypertension, diabetes mellitus type 2, hyperlipidemia, chronic anemia/thrombocytopenia, GERD, Barrett's esophagus with history of GI bleed and  urinary tract bleeding.  PMH also includes severe AS status post TAVR 01/18/2020.  He was seen virtually 11/25/2019 for follow-up and statedhe felt well. He stated he continued to have shortness of breath with minimal exertion. He had not been able to walk any distance for the last 6 months without developing dyspnea. He stated that when he would get  up and move he would use a very slow pace. He did not have any shortness of breath without activity. He did not take his glipizide and had been taking his Lasix as prescribed. He was given the opportunity to ask questions and all questions were answered. He and his wife agree to proceed with the cardiac catheterization and TAVR procedure.  He underwent TAVR 01/18/2020.  He presented the clinic 10/26/20 for follow-up evaluation stated he had noticed some increased work of breathing with increased swelling in his lower extremities.  He continued to stay at [facility] and reported he was receiving good care there.  He was increasing his physical activity.  He reported that he did enjoy salt on his food.  He hoped to return home in the spring.  I prescribed extra furosemide and extra potassium for the next 3 days.  I asked him to return for follow-up BMP and follow-up evaluation in 1 week.  I  gave the salty 6 diet sheet and had him increase his physical activity as tolerated.  He presents to the clinic today for follow-up evaluation and states he feels improved with his breathing.  His wife accompanies him and also reports she feels like his lower extremity swelling has improved.  She again reports that he enjoys salty type foods.  I educated on the importance of using seasonings other than salt to flavor his food.  I also wrote on his [place] instructions " low-sodium diet".  I will order a BMP today, give the salty 6 diet sheet, and have him follow-up with Dr. Stanford Hughes in 3 months.  Today hedenies chest pain, increasedshortness of breath, lower  extremity edema, fatigue, palpitations, melena, hematuria, hemoptysis, diaphoresis, weakness, presyncope, syncope, orthopnea, and PND.  Home Medications    Prior to Admission medications   Medication Sig Start Date End Date Taking? Authorizing Provider  ACCU-CHEK SOFTCLIX LANCETS lancets Use as instructed to check blood sugar up to qid. Dx Code E11.22. 06/02/18   Hali Marry, MD  acetaminophen (TYLENOL) 325 MG tablet Take 650 mg by mouth every 6 (six) hours as needed.    [provider]  Alcohol Swabs PADS Use as instructed to check blood sugar up to qid. Dx code E11.22 06/02/18   Emeterio Reeve, DO  AMBULATORY NON FORMULARY MEDICATION Medication Name: accu-chek aviva plus meter, accu-check aviva plus test strips & accu-chek softclix lancets to test twice a day. Dx: E11.29 06/03/18   Hali Marry, MD  amoxicillin (AMOXIL) 500 MG capsule Take 4 tablets by mouth 1 hour prior to any dental work 03/16/20   Eileen Stanford, PA-C  aspirin EC 81 MG tablet Take 1 tablet (81 mg total) by mouth daily. Swallow whole. 03/16/20   Eileen Stanford, PA-C  Blood Glucose Calibration (ACCU-CHEK AVIVA) SOLN Use as instructed per manufacturer directions. Dx code E11.22  06/02/18   Emeterio Reeve, DO  Blood Glucose Monitoring Suppl (ACCU-CHEK AVIVA PLUS) w/Device KIT Use as instructed to check blood sugar up to qid. Dx code E11.22 06/02/18   Hali Marry, MD  busPIRone (BUSPAR) 5 MG tablet Take 5 mg by mouth daily.    [provider]  calcium carbonate (TUMS - DOSED IN MG ELEMENTAL CALCIUM) 500 MG chewable tablet Chew 1 tablet by mouth daily.    [provider]  clopidogrel (PLAVIX) 75 MG tablet Take 75 mg by mouth daily.    [provider]  divalproex (DEPAKOTE) 125 MG DR tablet Take 125 mg by mouth in the morning and at bedtime.    [provider]  docusate sodium (COLACE) 100 MG capsule Take 100 mg by mouth 2 (two) times daily.     [provider]  doxazosin (CARDURA) 4 MG tablet Take 4 mg by mouth daily.    [provider]  ferrous sulfate 325 (65 FE) MG EC tablet Take 1 tablet (325 mg total) by mouth daily with breakfast. 09/14/18   Emeterio Reeve, DO  furosemide (LASIX) 40 MG tablet Take 0.5 tablets (20 mg total) by mouth daily. Take 44m X3 DAYS THEN BACK TO 20MG 10/26/20   CDeberah Pelton NP  glipiZIDE (GLUCOTROL) 5 MG tablet TAKE 1 TABLET BY MOUTH  TWICE DAILY BEFORE MEALS 01/10/20   AEmeterio Reeve DO  glucose blood (ACCU-CHEK AVIVA PLUS) test strip Use as instructed to check blood sugar up to qid. Dx Code E11.22. Disp generic per pt preference / insurance coverage 06/15/19   AEmeterio Reeve DO  insulin aspart (NOVOLOG) 100 UNIT/ML injection Inject into the skin 2 (two) times daily before a meal.    [provider]  Lancets (ONETOUCH ULTRASOFT) lancets Use as instructed to check blood sugar up to qid. Dx: E11.22 11/06/17   AEmeterio Reeve DO  loratadine (CLARITIN) 10 MG tablet Take 10 mg by mouth daily.    [provider]  melatonin 5 MG TABS Take 5 mg by mouth at bedtime.    [provider]  metFORMIN (GLUCOPHAGE) 1000 MG tablet TAKE 1 TABLET BY MOUTH  TWICE DAILY WITH A MEAL 01/10/20   AEmeterio Reeve DO  metoprolol succinate (TOPROL-XL) 25 MG 24 hr tablet Take 1 tablet (25 mg total) by mouth daily. 01/10/20   AEmeterio Reeve DO  metoprolol tartrate (LOPRESSOR) 25 MG tablet Take 25 mg by mouth 2 (two) times daily.    [provider]  mirtazapine (REMERON) 15 MG tablet Take 15 mg by mouth at bedtime.    [provider]  nitroGLYCERIN (NITROSTAT) 0.4 MG SL tablet Place 1 tablet (0.4 mg total) under the tongue every 5 (five) minutes as needed for chest pain. 09/14/18   AEmeterio Reeve DO  pantoprazole (PROTONIX) 20 MG tablet TAKE 1 TABLET BY MOUTH  DAILY 01/10/20   AEmeterio Reeve DO  polyethylene glycol (MIRALAX / GLYCOLAX) 17 g packet Take  17 g by mouth daily.    [provider]  potassium chloride (MICRO-K) 10 MEQ CR capsule Take 1 capsule (10 mEq total) by mouth daily. TAKE 20MEQx3 DAYS THEN BACK TO 10MEQ 10/26/20   CDeberah Pelton NP  pravastatin (PRAVACHOL) 40 MG tablet Take 1 tablet (40 mg total) by mouth daily. 01/10/20   AEmeterio Reeve DO  traMADol (ULTRAM) 50 MG tablet Take by mouth 2 (two) times daily.    [provider]    Family History    Family History  Problem Relation Age of Onset  . CAD Father   . Colon cancer Neg Hx   . Heart attack Neg Hx    He indicated that his mother is deceased. He indicated that his father is deceased. He indicated that his maternal grandmother is deceased. He indicated that his maternal grandfather is deceased. He indicated that his paternal grandmother is deceased. He indicated that his paternal grandfather is deceased. He indicated that the status of his neg hx is unknown.  Social History    Social History   Socioeconomic History  . Marital status: Married    Spouse name: Edd Fabian  . Number of children: 2  . Years of education: 69  . Highest education level: 11th grade  Occupational History  . Occupation: truck Geophysicist/field seismologist    Comment: retired  Tobacco Use  . Smoking status: Former Smoker    Quit date: 09/10/1987    Years since quitting: 33.1  . Smokeless tobacco: Never Used  Vaping Use  . Vaping Use: Never used  Substance and Sexual Activity  . Alcohol use: No  . Drug use: No  . Sexual activity: Not Currently  Other Topics Concern  . 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 Keeps grandsons and granddaughter   Social Determinants of Health   Financial Resource Strain: Not on file  Food Insecurity: No Food Insecurity  . Worried About Charity fundraiser in the Last Year: Never true  . Ran Out of Food in the Last Year: Never true  Transportation Needs: No  Transportation Needs  . Lack of Transportation (Medical): No  . Lack of Transportation (Non-Medical): No  Physical Activity: Not on file  Stress: Not on file  Social Connections: Not on file  Intimate Partner Violence: Not on file     Review of Systems    General:  No chills, fever, night sweats or weight changes.  Cardiovascular:  No chest pain, dyspnea on exertion, edema, orthopnea, palpitations, paroxysmal nocturnal dyspnea. Dermatological: No rash, lesions/masses Respiratory: No cough, dyspnea Urologic: No hematuria, dysuria Abdominal:   No nausea, vomiting, diarrhea, bright red blood per rectum, melena, or hematemesis Neurologic:  No visual changes, wkns, changes in mental status. All other systems reviewed and are otherwise negative except as noted above.  Physical Exam    VS:  BP 132/84   Pulse 72  , BMI There is no height or weight on file to calculate BMI. GEN: Well nourished, well developed, in no acute distress. HEENT: normal. Neck: Supple, no JVD, carotid bruits, or masses. Cardiac: RRR, no murmurs, rubs, or gallops. No clubbing, cyanosis, edema.  Radials/DP/PT 2+ and equal bilaterally.  Respiratory:  Respirations regular and unlabored, clear to auscultation bilaterally. GI: Soft, nontender, nondistended, BS + x 4. MS: no deformity or atrophy. Skin: warm and dry, no rash. Neuro:  Strength and sensation are intact. Psych: Normal affect.  Accessory Clinical Findings    Recent Labs: 12/28/2019: TSH 3.429 01/13/2020: ALT 16; B Natriuretic Peptide 416.5 01/20/2020: Magnesium 1.9 01/26/2020: BUN 23; Creatinine, Ser 1.17; Hemoglobin 10.8; Platelets 226; Potassium 4.0; Sodium 141   Recent Lipid Panel    Component Value Date/Time   CHOL 141 01/23/2020 0536   TRIG 83 01/23/2020 0536   HDL 51 01/23/2020 0536   CHOLHDL 2.8 01/23/2020 0536   VLDL 17 01/23/2020 0536   LDLCALC 73 01/23/2020 0536   LDLCALC 67  09/21/2019 0922    ECG personally reviewed by me today-none  today.  Echocardiogram 03/16/2020 IMPRESSIONS    1. S/P TAVR with mean gradient 10 mmHg, AVA 1.6 cm2 and no AI.  2. Left ventricular ejection fraction, by estimation, is 55 to 60%. The  left ventricle has normal function. The left ventricle has no regional  wall motion abnormalities. Left ventricular diastolic parameters are  indeterminate.  3. Right ventricular systolic function is normal. The right ventricular  size is mildly enlarged.  4. Left atrial size was moderately dilated.  5. Right atrial size was mildly dilated.  6. The mitral valve is normal in structure. Mild mitral valve  regurgitation. No evidence of mitral stenosis.  7. Tricuspid valve regurgitation is mild to moderate.  8. The aortic valve has been repaired/replaced. Aortic valve  regurgitation is not visualized. No aortic stenosis is present.  9. Aortic dilatation noted. There is mild dilatation of the ascending  aorta measuring 39 mm.   Assessment & Plan   1.  Bilateral lower extremity edema-bilateral  +1 pitting ankle edema.  Compliant with lower extremity support stockings.  Reports compliance with furosemide Continue furosemide, potassium Heart healthy low-sodium diet as salty 6 given Order BMP    Aortic stenosis status post TAVR -no increased DOE or shortness of breath.  Continues to live at [facility].  Continues to be wheelchair-bound and unable to perform his own ADLs.TAVR procedure discussed on 10/20/2019 visit.   He underwent TAVR 9/37/9024 which was complicated by balloon rupture during valve deployment and axillary artery damage which was successfully repaired.  There was no significant valvular injury by fluoroscopy.  His postoperative echocardiogram showed an EF of 70%, normally functioning TAVR.  Moderate-severe TR and pulmonary hypertension were noted.  He developed profound bradycardia with a junctional rhythm requiring PPM 01/24/2020.  He also developed left arm weakness and AMS  postoperatively.  His head CT was negative.  MRI head showed numerous small acute infarcts in the right greater and left cerebral hemispheres in the left cerebellum compatible with central embolic source.  Carotid Doppler showed total occlusion of his right ICA.  Neurology recommended aspirin 325 daily and Plavix. VVS recommended no revascularization of R ICA but would need left carotid intervention via carotid endarterectomy or TCAR.  He also required transfusion due to worsening anemia.  He was transferred to SNF at discharge due to his inability to provide his own ADLs. Continue aspirin, Plavix, metoprolol, amoxicillin as needed prior to dental work. Heart healthy low-sodium diet Increase physical activity as tolerated Repeat echocardiogram 7/22  CAD-denies chest pain today and recent episodes of chest pain.  History of CABG 1997.  He underwent cardiac catheterization 11/26/2019 which showed severe multivessel coronary artery disease with chronic occlusion of the left coronary artery and high-grade stenosis of the RCA.  His LIMA-LAD was patent and his SVG-obtuse marginal was patent.  His RIMA-RCA was patent.  He went back to the cardiac Cath Lab 12/24/19 and received PCI to the RCA which was complicated by coronary perforation requiring stenting.  Follow-up echocardiogram showed no pericardial effusion.  He was discharged from the hospital readmitted 12/28/2019 with complaints of syncopal episode.  This was felt to be related to his aortic stenosis, atrial fibrillation and dehydration. Continue clopidogrel, metoprolol succinate, nitroglycerin, pravastatin  Heart healthy low-sodium diet Increase physical activity as tolerated  PPM-V paced 60 bpm.  Continue metoprolol , aspirin, clopidogrel Avoid triggers caffeine, chocolate, EtOH, etc.  Essential hypertension-BP [id number]. Continue metoprolol, furosemide ,  doxazosin  Heart healthy low-sodium diet-salty 6 given Increase physical activity as  tolerated  Hyperlipidemia- 01/23/2020: Cholesterol 141; HDL 51; LDL Cholesterol 73; Triglycerides 83; VLDL 17 Continue pravastatin 40 mg tablet daily Heart healthy low-sodium high-fiber diet Increase physical activity as tolerated  Dilated ascending aorta-no recent episodes of chest or back pain.  Echocardiogram 03/16/2020 showed ascending aorta measuring 39 mm. Continue metoprolol, furosemide, doxazosin Repeat echocardiogram 7/22  Disposition: Follow-up with Dr. Stanford Hughes  in 3 months.Jossie Ng. Tyrie Porzio NP-C    11/02/2020, 9:24 AM [place] Office [phone] Fax [phone]  Notice: This dictation was prepared with Dragon dictation along with smaller phrase technology. Any transcriptional errors that result from this process are unintentional and may not be corrected upon review.  I spent 10 minutes examining this patient, reviewing medications, and using patient centered shared decision making involving her cardiac care.  Prior to her visit I spent greater than 20 minutes reviewing her past medical history,  medications, and prior cardiac tests.

## 2020-11-02 ENCOUNTER — Ambulatory Visit: Payer: Medicare Other | Admitting: General Practice

## 2020-11-02 ENCOUNTER — Other Ambulatory Visit: Payer: Self-pay

## 2020-11-02 ENCOUNTER — Encounter: Payer: Self-pay | Admitting: General Practice

## 2020-11-02 VITALS — BP 132/84 | HR 72

## 2020-11-02 DIAGNOSIS — I10 Essential (primary) hypertension: Secondary | ICD-10-CM

## 2020-11-02 DIAGNOSIS — Z950 Presence of cardiac pacemaker: Secondary | ICD-10-CM | POA: Diagnosis not present

## 2020-11-02 DIAGNOSIS — R600 Localized edema: Secondary | ICD-10-CM | POA: Diagnosis not present

## 2020-11-02 DIAGNOSIS — Z952 Presence of prosthetic heart valve: Secondary | ICD-10-CM

## 2020-11-02 DIAGNOSIS — I2510 Atherosclerotic heart disease of native coronary artery without angina pectoris: Secondary | ICD-10-CM | POA: Diagnosis not present

## 2020-11-02 DIAGNOSIS — Z79899 Other long term (current) drug therapy: Secondary | ICD-10-CM

## 2020-11-02 DIAGNOSIS — E7800 Pure hypercholesterolemia, unspecified: Secondary | ICD-10-CM

## 2020-11-02 LAB — BASIC METABOLIC PANEL
BUN/Creatinine Ratio: 20 (ref 10–24)
BUN: 16 mg/dL (ref 8–27)
CO2: 26 mmol/L (ref 20–29)
Calcium: 8.8 mg/dL (ref 8.6–10.2)
Chloride: 97 mmol/L (ref 96–106)
Creatinine, Ser: 0.82 mg/dL (ref 0.76–1.27)
GFR calc Af Amer: 97 mL/min/{1.73_m2} (ref >59)
GFR calc non Af Amer: 84 mL/min/{1.73_m2} (ref >59)
Glucose: 164 mg/dL — ABNORMAL HIGH (ref 65–99)
Potassium: 4.9 mmol/L (ref 3.5–5.2)
Sodium: 140 mmol/L (ref 134–144)

## 2020-11-02 NOTE — Patient Instructions (Signed)
Medication Instructions:  The current medical regimen is effective;  continue present plan and medications as directed. Please refer to the Current Medication list given to you today.  *If you need a refill on your cardiac medications before your next appointment, please call your pharmacy*  Lab Work: BMET TODAY If you have labs (blood work) drawn today and your tests are completely normal, you will receive your results only by:  MyChart Message (if you have MyChart) OR A paper copy in the mail.  If you have any lab test that is abnormal or we need to change your treatment, we will call you to review the results. You may go to any [facility] that is convenient for you however, we do have a lab in our office that is able to assist you. You DO NOT need an appointment for our lab. The lab is open 8:00am and closes at 4:00pm. Lunch 12:45 - 1:45pm.  Testing/Procedures: NONE  Special Instructions PLEASE READ AND FOLLOW SALTY 6-ATTACHED-1,800 mg daily  Follow-Up: Your next appointment:  3 month(s) In Person with Olga Millers, MD OR IF UNAVAILABLE JESSE CLEAVER, FNP-C   At [facility], you and your health needs are our priority.  As part of our continuing mission to provide you with exceptional heart care, we have created designated Provider Care Teams.  These Care Teams include your primary Cardiologist (physician) and Advanced Practice Providers (APPs -  Physician Assistants and Nurse Practitioners) who all work together to provide you with the care you need, when you need it.  We recommend signing up for the patient portal called "MyChart".  Sign up information is provided on this After Visit Summary.  MyChart is used to connect with patients for Virtual Visits (Telemedicine).  Patients are able to view lab/test results, encounter notes, upcoming appointments, etc.  Non-urgent messages can be sent to your provider as well.   To learn more about what you can do with MyChart, go to  [email or web address].

## 2020-11-08 ENCOUNTER — Ambulatory Visit (INDEPENDENT_AMBULATORY_CARE_PROVIDER_SITE_OTHER): Payer: Medicare Other

## 2020-11-08 DIAGNOSIS — I442 Atrioventricular block, complete: Secondary | ICD-10-CM | POA: Diagnosis not present

## 2020-11-09 ENCOUNTER — Telehealth: Payer: Self-pay

## 2020-11-09 NOTE — Telephone Encounter (Signed)
Spoke with patient to remind of missed remote transmission 

## 2020-11-10 LAB — CUP PACEART REMOTE DEVICE CHECK
Battery Remaining Longevity: 86 mo
Battery Remaining Percentage: 95.5 %
Battery Voltage: 3.01 V
Brady Statistic RV Percent Paced: 99 %
Date Time Interrogation Session: 20220303142846
Implantable Lead Implant Date: 20210517
Implantable Lead Location: 753860
Implantable Pulse Generator Implant Date: 20210517
Lead Channel Impedance Value: 480 Ohm
Lead Channel Pacing Threshold Amplitude: 0.75 V
Lead Channel Pacing Threshold Pulse Width: 0.5 ms
Lead Channel Sensing Intrinsic Amplitude: 8.6 mV
Lead Channel Setting Pacing Amplitude: 3.5 V
Lead Channel Setting Pacing Pulse Width: 0.5 ms
Lead Channel Setting Sensing Sensitivity: 4 mV
Pulse Gen Model: 1272
Pulse Gen Serial Number: 3805346

## 2020-11-13 ENCOUNTER — Other Ambulatory Visit: Payer: Self-pay | Admitting: Physician Assistant

## 2020-11-13 DIAGNOSIS — Z952 Presence of prosthetic heart valve: Secondary | ICD-10-CM

## 2020-11-17 NOTE — Progress Notes (Signed)
Remote pacemaker transmission.   

## 2021-01-04 NOTE — Progress Notes (Deleted)
[facility] [place]                                       Cardiology Office Note    Date:  01/04/2021   ID:  Garrett Hughes, DOB 1940/12/03, MRN [id number]  PCP:  Emeterio Reeve, DO  Cardiologist: Kirk Ruths, MD / Dr. Burt Knack & Dr. Roxy Manns (TAVR)  CC: 1 year s/p TAVR   History of Present Illness:  Garrett Hughes is a 80 y.o. male with a history of CAD s/p CABG 1997 and PCI of RCA (12/24/19), RBBB, chronic atrial fibrillation (pt declines anticoagulation),high grade carotid artery disease,HTN, DMT2, HLD, chronic anemia/thrombocytopenia, GERD, Barrett's esophagus, h/o GIB and urinary tract bleeding and severe ASs/p TAVR (01/18/20) s/b axillary artery damage and CVA who presents to clinic for follow up.   Patient's cardiac history dates back to 1997 when he first presented with coronary artery disease. He underwent coronary artery bypass grafting x3 at [facility] in [place]. He has been followed for the last several years by Dr. Stanford Breed with history of aortic stenosis, coronary artery disease, and persistent atrial fibrillation. He was anticoagulated using warfarin for a period of time but this was stopped because of problems with GI bleeding and hematuria and difficulty maintaining therapeutic levels.  Patient had known history of aortic stenosis that has progressed on serial follow-up echocardiograms. He became symptomatic last year and echo October 19, 2019 revealed normal left ventricular systolic function with severe aortic stenosis, moderate aortic insufficiency and moderate mitral regurgitation. He was seen in follow-up by Dr. Stanford Breed and referred to the multidisciplinary heart valve clinic. Diagnostic cardiac catheterization performed by Dr. Burt Knack on November 26, 2019 revealed severe multivessel coronary artery disease with chronic occlusion of the left coronary artery and severe high-grade stenosis of the right  coronary artery. There was continued patency of left internal mammary artery to the left anterior descending coronary artery and greater saphenous vein graft to the obtuse marginal branch. Right internal mammary artery graft previously placed to the right coronary artery was atretic. The patient's case was reviewed by multidisciplinary team of specialists and the patient subsequently was brought back to the Cath Lab on December 24, 2019 for PCI and stenting of the right coronary artery. This procedure was complicated by coronary perforation requiring placement of a covered stent.Following the procedure the patient had 2 days of chest pain but serial echocardiograms revealed no significant pericardial effusion. The patient was discharged from the hospital but readmitted December 28, 2019 following a syncopal episode that was felt likely to be related to his aortic stenosis, atrial fibrillation, and dehydration. Medications were adjusted and a ZIOpatch monitor placed.   He underwent successful TAVR with a78m Edwards Sapien 3 THV via the left subclavianapproach on 01/18/20. Operative course complicated by balloon rupture during valve deployment and axillary artery damage, this was successfully repaired and there was no significant vascular injury by flouroscopy.Post operative echoshowed EF 70%, normally functioning TAVR with a mean gradient of 9.3 mm Hg and no PVL. There was mod-severe TR and pulm HTN. He then developed profound bradycardia with a junctional rhythm and required PPM on 01/24/20. Additionally, he developed left arm weakness and AMS post operatively. Stat head CT was negative. Follow up MRI showed numerous small acute infarcts in the right greater than left cerebral hemispheres and left cerebellum compatible with  a central embolic source. CTA and carotid dopplers also showed total occlusion on RICA. Etiology was felt to be related to multiple, large vessel occlusion/stenosis in the setting of  procedure/low BP vs TAVR procedure related vs. chronic afib not on AC. Neurology recommended asa 325 mg daily and plavix 11m daily. VVS recommended no revascularization for RICA but that he would need left carotid intervention either endarterectomy or TCAR once his new baseline neurologic status is established. .Additionally, he required transfusion due to worsening anemia. Given the patient's inability to care for himself he was discharged to a SNF at [facility]  1 month echo showed EF 55%, normally functioning TAVR with a mean gradient of 10 mmHg and no PVL. Aspirin was decreased to 81 mg daily given excessive bruising.   He was seen in 10/2020 by JColetta Memosfor LE edema and dyspnea that improved with increase lasix and low sodium diet.   Today he presents to clinic for follow up.  Past Medical History:  Diagnosis Date  . Anemia   . Arthritis   . Atherosclerosis of native coronary artery of native heart without angina pectoris 01/05/2016  . Benign essential hypertension 01/05/2016  . Carotid artery disease ([place]   . Cataract, nuclear, right 05/06/2014  . Chronic atrial fibrillation ([facility] 12/20/2015  . Chronic diarrhea   . COPD (chronic obstructive pulmonary disease) (HCC)    on nocturnal O2  . Current use of long term anticoagulation 12/20/2015  . Diabetic kidney disease ([facility] 01/05/2016  . GERD (gastroesophageal reflux disease)   . GI bleed   . History of Barrett's esophagus 12/20/2015  . History of blood in urine   . History of kidney stones   . 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 Hyperlipemia 10/26/2015  . Low back pain 05/02/2016  . S/P TAVR (transcatheter aortic valve replacement) 01/18/2020   s/p successful TAVR w/ a 26 mm Edwards S3U via the left subclavian approach (failed Shockwave TF) with Dr. CBurt Knackand Dr. ORoxy Manns  . Severe aortic stenosis   . Thrombocytopenia ([facility]   . Type 2 diabetes mellitus with kidney complication, without  long-term current use of insulin ([place] 07/03/2016    Past Surgical History:  Procedure Laterality Date  . AORTOGRAM  01/18/2020   Procedure: Aortogram;  Surgeon: CSherren Mocha MD;  [place] [facility]  Service: Open Heart Surgery;;  . BACK SURGERY  2004  . [place] . CORONARY ARTERY BYPASS GRAFT  1997  . CORONARY ATHERECTOMY N/A 12/24/2019   Procedure: CORONARY ATHERECTOMY;  Surgeon: CSherren Mocha MD;  [facility];  Service: Cardiovascular;  Laterality: N/A;  . INTRAOPERATIVE TRANSESOPHAGEAL ECHOCARDIOGRAM  01/18/2020   Procedure: Intraoperative Transesophageal Echocardiogram;  Surgeon: CSherren Mocha MD;  [place] [facility]  Service: Open Heart Surgery;;  . INTRAOPERATIVE TRANSTHORACIC ECHOCARDIOGRAM N/A 01/18/2020   Procedure: Intraoperative Transthoracic Echocardiogram;  Surgeon: CSherren Mocha MD;  [place] [facility]  Service: Open Heart Surgery;  Laterality: N/A;  . LOWER EXTREMITY ANGIOGRAM  01/18/2020   Procedure: Lower Extremity Angiogram;  Surgeon: CSherren Mocha MD;  [place] [facility]  Service: Open Heart Surgery;;  Shockwave  . PACEMAKER IMPLANT N/A 01/24/2020   Procedure: PACEMAKER IMPLANT;  Surgeon: KDeboraha Sprang MD;  [facility];  Service: Cardiovascular;  Laterality: N/A;  . RIGHT HEART CATH AND CORONARY/GRAFT ANGIOGRAPHY N/A 11/26/2019   Procedure: RIGHT HEART CATH AND CORONARY/GRAFT ANGIOGRAPHY;  Surgeon: Sherren Mocha, MD;  [place]: [facility];  Service: Cardiovascular;  Laterality: N/A;  . SHOULDER SURGERY  2005  . TRANSCATHETER AORTIC VALVE REPLACEMENT, TRANSFEMORAL Left 01/18/2020   Procedure: Transcatheter Aortic Valve Replacement (subclavian approach);  Surgeon: Sherren Mocha, MD;  Location: [facility];  Service: Open Heart Surgery;  Laterality: Left;    Current Medications: Outpatient Medications Prior to Visit  Medication Sig Dispense Refill  . ACCU-CHEK SOFTCLIX LANCETS lancets Use as instructed to check  blood sugar up to qid. Dx Code E11.22. 100 each 12  . acetaminophen (TYLENOL) 325 MG tablet Take 650 mg by mouth every 6 (six) hours as needed.    . Alcohol Swabs PADS Use as instructed to check blood sugar up to qid. Dx code E11.22 100 each 1  . AMBULATORY NON FORMULARY MEDICATION Medication Name: accu-chek aviva plus meter, accu-check aviva plus test strips & accu-chek softclix lancets to test twice a day. Dx: E11.29 1 vial PRN  . aspirin EC 81 MG tablet Take 1 tablet (81 mg total) by mouth daily. Swallow whole. 90 tablet 3  . Blood Glucose Calibration (ACCU-CHEK AVIVA) SOLN Use as instructed per manufacturer directions. Dx code E11.22 1 each 0  . Blood Glucose Monitoring Suppl (ACCU-CHEK AVIVA PLUS) w/Device KIT Use as instructed to check blood sugar up to qid. Dx code E11.22 1 kit 0  . busPIRone (BUSPAR) 5 MG tablet Take 5 mg by mouth daily.    . Calcium Carbonate (CALCIUM 500 PO) Take 500 mg by mouth.    . calcium carbonate (TUMS - DOSED IN MG ELEMENTAL CALCIUM) 500 MG chewable tablet Chew 1 tablet by mouth daily.    . clopidogrel (PLAVIX) 75 MG tablet Take 75 mg by mouth daily.    . divalproex (DEPAKOTE) 125 MG DR tablet Take 125 mg by mouth 3 (three) times daily.    Marland Kitchen docusate sodium (COLACE) 100 MG capsule Take 100 mg by mouth 2 (two) times daily.    Marland Kitchen doxazosin (CARDURA) 4 MG tablet Take 4 mg by mouth daily.    . ferrous sulfate 325 (65 FE) MG EC tablet Take 1 tablet (325 mg total) by mouth daily with breakfast. 90 tablet 3  . furosemide (LASIX) 40 MG tablet Take 0.5 tablets (20 mg total) by mouth daily. Take 63m X3 DAYS THEN BACK TO 20MG 30 tablet   . glipiZIDE (GLUCOTROL) 5 MG tablet TAKE 1 TABLET BY MOUTH  TWICE DAILY BEFORE MEALS 180 tablet 0  . glucose blood (ACCU-CHEK AVIVA PLUS) test strip Use as instructed to check blood sugar up to qid. Dx Code E11.22. Disp generic per pt preference / insurance coverage 100 each 99  . insulin aspart (NOVOLOG) 100 UNIT/ML injection Inject into the  skin 2 (two) times daily before a meal.    . Lancets (ONETOUCH ULTRASOFT) lancets Use as instructed to check blood sugar up to qid. Dx: E11.22 100 each 99  . loratadine (CLARITIN) 10 MG tablet Take 10 mg by mouth daily.    . melatonin 5 MG TABS Take 5 mg by mouth at bedtime.    . metFORMIN (GLUCOPHAGE) 1000 MG tablet TAKE 1 TABLET BY MOUTH  TWICE DAILY WITH A MEAL 180 tablet 0  . metoprolol tartrate (LOPRESSOR) 25 MG tablet Take 25 mg by mouth 2 (two) times daily.    . mirtazapine (REMERON) 15 MG tablet Take 15 mg by mouth at bedtime.    . nitroGLYCERIN (NITROSTAT) 0.4 MG SL tablet Place 1 tablet (0.4  mg total) under the tongue every 5 (five) minutes as needed for chest pain. 30 tablet 1  . ondansetron (ZOFRAN) 4 MG tablet Take by mouth every 8 (eight) hours as needed for nausea or vomiting.    . pantoprazole (PROTONIX) 20 MG tablet TAKE 1 TABLET BY MOUTH  DAILY 90 tablet 0  . polyethylene glycol (MIRALAX / GLYCOLAX) 17 g packet Take 17 g by mouth daily.    . potassium chloride (MICRO-K) 10 MEQ CR capsule Take 1 capsule (10 mEq total) by mouth daily. TAKE 20MEQx3 DAYS THEN BACK TO 10MEQ 65 capsule 6  . pravastatin (PRAVACHOL) 40 MG tablet Take 1 tablet (40 mg total) by mouth daily. 90 tablet 0  . traMADol (ULTRAM) 50 MG tablet Take by mouth 2 (two) times daily.     No facility-administered medications prior to visit.     Allergies:   Atorvastatin, Cholestyramine, and Pravastatin   Social History   Socioeconomic History  . Marital status: Married    Spouse name: Edd Fabian  . Number of children: 2  . Years of education: 24  . Highest education level: 11th grade  Occupational History  . Occupation: truck Geophysicist/field seismologist    Comment: retired  Tobacco Use  . Smoking status: Former Smoker    Quit date: 09/10/1987    Years since quitting: 33.3  . Smokeless tobacco: Never Used  Vaping Use  . Vaping Use: Never used  Substance and Sexual Activity  . Alcohol use: No  . Drug use: No  . Sexual activity:  Not Currently  Other Topics Concern  . 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 Keeps grandsons and granddaughter   Social Determinants of Health   Financial Resource Strain: Not on file  Food Insecurity: No Food Insecurity  . Worried About Charity fundraiser in the Last Year: Never true  . Ran Out of Food in the Last Year: Never true  Transportation Needs: No Transportation Needs  . Lack of Transportation (Medical): No  . Lack of Transportation (Non-Medical): No  Physical Activity: Not on file  Stress: Not on file  Social Connections: Not on file     Family History:  The patient's family history includes CAD in his father.     ROS:   Please see the history of present illness.    ROS All other systems reviewed and are negative.   PHYSICAL EXAM:   VS:  There were no vitals taken for this visit.   GEN: Well nourished, well developed, in no acute distress, sitting in a wheelchair.  HEENT: normal Neck: no JVD or masses Cardiac: irreg irreg. Soft flow murmur. No rubs, or gallops,no edema  Respiratory:  clear to auscultation bilaterally, normal work of breathing GI: soft, nontender, nondistended, + BS MS: no deformity or atrophy Skin: warm and dry, no rash. Extensive ecchymosis and lacerations on arms  Neuro:  Alert and Oriented x 3, Strength and sensation are intact Psych: euthymic mood, full affect   Wt Readings from Last 3 Encounters:  10/26/20 125 lb (56.7 kg)  05/11/20 110 lb (49.9 kg)  03/16/20 122 lb (55.3 kg)      Studies/Labs Reviewed:   EKG:  EKG is NOT ordered today.    Recent Labs: 01/13/2020: ALT 16; B Natriuretic Peptide 416.5 01/20/2020: Magnesium 1.9 01/26/2020: Hemoglobin 10.8; Platelets 226 11/02/2020: BUN 16; Creatinine, Ser 0.82; Potassium 4.9;  Sodium 140   Lipid Panel    Component Value Date/Time   CHOL 141 01/23/2020 0536   TRIG 83 01/23/2020 0536   HDL  51 01/23/2020 0536   CHOLHDL 2.8 01/23/2020 0536   VLDL 17 01/23/2020 0536   LDLCALC 73 01/23/2020 0536   LDLCALC 67 09/21/2019 0922    Additional studies/ records that were reviewed today include:  TAVR OPERATIVE NOTE   Date of Procedure:01/18/2020  Preoperative Diagnosis:Severe Aortic Stenosis   Postoperative Diagnosis:Same   Procedure:   Transcatheter Aortic Valve Replacement - Left Trans-SubclavianApproach Edwards Sapien 3 UltraTHV (size 50m, model # [id number] serial # [id number]  Co-Surgeons:Clarence H. ORoxy Manns MD and MSherren Mocha MD  Anesthesiologist:David JLinna Caprice MD  Echocardiographer:Mihai Croitoru, MD  Pre-operative Echo Findings: ? Severe aortic stenosis ? Normalleft ventricular systolic function  Post-operative Echo Findings: ? Noparavalvular leak ? Normalleft ventricular systolic function  _______________   Echo5/12/21: IMPRESSIONS  1. Left ventricular ejection fraction, by estimation, is 70 to 75%. The left ventricle has hyperdynamic function. The left ventricle has no regional wall motion abnormalities. There is mild concentric left  ventricular hypertrophy. Left ventricular diastolic function could not be evaluated. There is the interventricular septum is flattened in systole, consistent with right ventricular pressure overload.  2. Right ventricular systolic function is mildly reduced. The right  ventricular size is moderately enlarged. There is severely elevated  pulmonary artery systolic pressure. The estimated right ventricular  systolic pressure is 829.4mmHg.  3. The mitral valve is normal in structure. Mild to moderate mitral valve regurgitation.  4. Tricuspid valve regurgitation is moderate to severe.  5. The aortic valve has been repaired/replaced. Aortic valve  regurgitation is not visualized. There is a 26 mm  Edwards Sapien  prosthetic (TAVR) valve present in the aortic position. Procedure Date:  01/18/2020. Echo findings are consistent with normal  structure and function of the aortic valve prosthesis. Aortic valve mean  gradient measures 9.3 mmHg. Aortic valve Vmax measures 2.24 m/s.  6. The inferior vena cava is dilated in size with <50% respiratory  variability, suggesting right atrial pressure of 15 mmHg.   Comparison(s): There is marked worsening of pulmonary artery hypertension and severity of tricuspid regurgitation since the intra-procedural study.   ______________________   MRI 01/22/20 IMPRESSION: 1. Numerous small acute infarcts in the right greater than left cerebral hemispheres and left cerebellum compatible with a central embolic source. 2. 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   ________________   CT angio neck 01/23/20 IMPRESSION: Right ICA occlusion at the origin, precise age indeterminate. This may well be chronic.  Aortic atherosclerosis.  Advanced atherosclerotic disease at the left carotid bifurcation and proximal ICA. Severe stenosis of the proximal ICA with luminal diameter estimated at 1 mm. This would be an 80% or greater stenosis.  Stenotic disease at both vertebral artery origins. 70% stenosis on the right. 50% stenosis on the left.  Dense calcification at the basilar tip consistent with embolized plaque. This was not present on a CT scan 12/28/2019.  No acute intracranial large vessel occlusion. The embolized plaque at the basilar tip does result in stenosis of both P1 origins, but those vessels continue to show flow.  __________________  Echo 03/16/2020 IMPRESSIONS  1. S/P TAVR with mean gradient 10 mmHg, AVA 1.6 cm2 and no AI.  2. Left ventricular ejection fraction, by estimation, is 55 to 60%. The  left ventricle has normal function. The left ventricle has no regional  wall motion abnormalities. Left  ventricular  diastolic parameters are  indeterminate.  3. Right ventricular systolic function is normal. The right ventricular  size is mildly enlarged.  4. Left atrial size was moderately dilated.  5. Right atrial size was mildly dilated.  6. The mitral valve is normal in structure. Mild mitral valve  regurgitation. No evidence of mitral stenosis.  7. Tricuspid valve regurgitation is mild to moderate.  8. The aortic valve has been repaired/replaced. Aortic valve  regurgitation is not visualized. No aortic stenosis is present.  9. Aortic dilatation noted. There is mild dilatation of the ascending  aorta measuring 39 mm.    _____________________  Echo 01/04/2021 ***  ASSESSMENT & PLAN:   Severe AS s/p TAVR: long term DAPT  CBH with a junctional escape: followed by Dr. Caryl Comes  Hx of CVA: still with considerable cognitive deficits and left sided weakness. . Continue DAPT   HTN:  Chronic diastolic CHF: appears euvolemic. Continue lasix 70m daily.   CAD:s/p complex atherectomy and PCIoncomplicated by dissection/contained perforation treated with overlapping DES and a Papyrus covered stenton 12/24/19. No recurrent angina. Continue ASA and plavix(long term).  Chronic afib:underlying afib. Has previously refused oral anticoagulation and has a history of GI/GU bleeding.   Carotid artery disease: 80-99% RICA stenosis. Followed by Dr. BTrula Slade( possible TCAR candidate).CTA head and neck completed in hospital and showed right ICA occlusion at origin, severe stenosis of proximal left ICA, >80% stenosis. Bilateral VA origin stenosis, 70% on the right, 50% on the left. Dense calcification plaque at the basilar tip, new from previous CT, with bilateral stenosis of P1 origins.Dr. BTrula Sladerecommended no revascularization for RICA but he will need left carotid intervention either endarterectomy or TCAR once his new baseline neurologic status is established. Patient and wife are  not interested in pursuing any more invasive procedures or surgeries at this point for fear of making him worse.    Medication Adjustments/Labs and Tests Ordered: Current medicines are reviewed at length with the patient today.  Concerns regarding medicines are outlined above.  Medication changes, Labs and Tests ordered today are listed in the Patient Instructions below. There are no Patient Instructions on file for this visit.   Signed, KAngelena Form PA-C  01/04/2021 10:40 AM    [facility] [address] [place]   [id number]: [phone] Fax: [phone]

## 2021-01-09 ENCOUNTER — Telehealth (HOSPITAL_COMMUNITY): Payer: Self-pay | Admitting: Physician Assistant

## 2021-01-09 NOTE — Telephone Encounter (Signed)
Thank you for letting me know

## 2021-01-09 NOTE — Telephone Encounter (Signed)
Patients wife gail called and cancelled Echo and 1 year follow up with PA due to patient is now under the care of Hospice and she did not wish to reschedule. Order will be removed from the ECHO WQ.

## 2021-01-11 ENCOUNTER — Ambulatory Visit: Payer: Medicare Other | Admitting: Physician Assistant

## 2021-01-11 ENCOUNTER — Other Ambulatory Visit (HOSPITAL_COMMUNITY): Payer: Medicare Other

## 2021-01-11 DIAGNOSIS — I6529 Occlusion and stenosis of unspecified carotid artery: Secondary | ICD-10-CM

## 2021-01-11 DIAGNOSIS — I482 Chronic atrial fibrillation, unspecified: Secondary | ICD-10-CM

## 2021-01-11 DIAGNOSIS — Z952 Presence of prosthetic heart valve: Secondary | ICD-10-CM

## 2021-01-11 DIAGNOSIS — Z950 Presence of cardiac pacemaker: Secondary | ICD-10-CM

## 2021-01-11 DIAGNOSIS — I2510 Atherosclerotic heart disease of native coronary artery without angina pectoris: Secondary | ICD-10-CM

## 2021-01-11 DIAGNOSIS — Z8673 Personal history of transient ischemic attack (TIA), and cerebral infarction without residual deficits: Secondary | ICD-10-CM

## 2021-01-11 DIAGNOSIS — I10 Essential (primary) hypertension: Secondary | ICD-10-CM

## 2021-01-30 ENCOUNTER — Ambulatory Visit: Payer: Medicare Other | Admitting: Cardiology

## 2021-02-07 ENCOUNTER — Ambulatory Visit (INDEPENDENT_AMBULATORY_CARE_PROVIDER_SITE_OTHER)

## 2021-02-07 DIAGNOSIS — I442 Atrioventricular block, complete: Secondary | ICD-10-CM

## 2021-02-08 LAB — CUP PACEART REMOTE DEVICE CHECK
Battery Remaining Longevity: 86 mo
Battery Remaining Percentage: 95.5 %
Battery Voltage: 3.01 V
Brady Statistic RV Percent Paced: 99 %
Date Time Interrogation Session: 20220601020014
Implantable Lead Implant Date: 20210517
Implantable Lead Location: 753860
Implantable Pulse Generator Implant Date: 20210517
Lead Channel Impedance Value: 480 Ohm
Lead Channel Pacing Threshold Amplitude: 0.75 V
Lead Channel Pacing Threshold Pulse Width: 0.5 ms
Lead Channel Sensing Intrinsic Amplitude: 8.4 mV
Lead Channel Setting Pacing Amplitude: 3.5 V
Lead Channel Setting Pacing Pulse Width: 0.5 ms
Lead Channel Setting Sensing Sensitivity: 4 mV
Pulse Gen Model: 1272
Pulse Gen Serial Number: 3805346

## 2021-02-14 DIAGNOSIS — U071 COVID-19: Secondary | ICD-10-CM | POA: Insufficient documentation

## 2021-03-02 NOTE — Progress Notes (Signed)
Remote pacemaker transmission.   

## 2021-04-10 ENCOUNTER — Encounter: Payer: Medicare Other | Admitting: Medical-Surgical

## 2021-05-09 ENCOUNTER — Ambulatory Visit (INDEPENDENT_AMBULATORY_CARE_PROVIDER_SITE_OTHER)

## 2021-05-09 DIAGNOSIS — I442 Atrioventricular block, complete: Secondary | ICD-10-CM | POA: Diagnosis not present

## 2021-05-15 LAB — CUP PACEART REMOTE DEVICE CHECK
Battery Remaining Longevity: 72 mo
Battery Remaining Percentage: 86 %
Battery Voltage: 3.01 V
Brady Statistic RV Percent Paced: 99 %
Date Time Interrogation Session: 20220831105028
Implantable Lead Implant Date: 20210517
Implantable Lead Location: 753860
Implantable Pulse Generator Implant Date: 20210517
Lead Channel Impedance Value: 480 Ohm
Lead Channel Pacing Threshold Amplitude: 0.75 V
Lead Channel Pacing Threshold Pulse Width: 0.5 ms
Lead Channel Sensing Intrinsic Amplitude: 7 mV
Lead Channel Setting Pacing Amplitude: 3.5 V
Lead Channel Setting Pacing Pulse Width: 0.5 ms
Lead Channel Setting Sensing Sensitivity: 4 mV
Pulse Gen Model: 1272
Pulse Gen Serial Number: 3805346

## 2021-05-22 NOTE — Progress Notes (Signed)
Remote pacemaker transmission.   

## 2021-08-08 ENCOUNTER — Ambulatory Visit (INDEPENDENT_AMBULATORY_CARE_PROVIDER_SITE_OTHER)

## 2021-08-08 DIAGNOSIS — I442 Atrioventricular block, complete: Secondary | ICD-10-CM

## 2021-08-08 LAB — CUP PACEART REMOTE DEVICE CHECK
Battery Remaining Longevity: 68 mo
Battery Remaining Percentage: 83 %
Battery Voltage: 3.01 V
Brady Statistic RV Percent Paced: 99 %
Date Time Interrogation Session: 20221130020017
Implantable Lead Implant Date: 20210517
Implantable Lead Location: 753860
Implantable Pulse Generator Implant Date: 20210517
Lead Channel Impedance Value: 450 Ohm
Lead Channel Pacing Threshold Amplitude: 0.75 V
Lead Channel Pacing Threshold Pulse Width: 0.5 ms
Lead Channel Sensing Intrinsic Amplitude: 7.5 mV
Lead Channel Setting Pacing Amplitude: 3.5 V
Lead Channel Setting Pacing Pulse Width: 0.5 ms
Lead Channel Setting Sensing Sensitivity: 4 mV
Pulse Gen Model: 1272
Pulse Gen Serial Number: 3805346

## 2021-08-17 NOTE — Progress Notes (Signed)
Remote pacemaker transmission.   

## 2021-11-07 ENCOUNTER — Ambulatory Visit (INDEPENDENT_AMBULATORY_CARE_PROVIDER_SITE_OTHER)

## 2021-11-07 DIAGNOSIS — I442 Atrioventricular block, complete: Secondary | ICD-10-CM

## 2021-11-07 LAB — CUP PACEART REMOTE DEVICE CHECK
Battery Remaining Longevity: 65 mo
Battery Remaining Percentage: 80 %
Battery Voltage: 3.01 V
Brady Statistic RV Percent Paced: 99 %
Date Time Interrogation Session: 20230301040756
Implantable Lead Implant Date: 20210517
Implantable Lead Location: 753860
Implantable Pulse Generator Implant Date: 20210517
Lead Channel Impedance Value: 450 Ohm
Lead Channel Pacing Threshold Amplitude: 0.75 V
Lead Channel Pacing Threshold Pulse Width: 0.5 ms
Lead Channel Sensing Intrinsic Amplitude: 4.1 mV
Lead Channel Setting Pacing Amplitude: 3.5 V
Lead Channel Setting Pacing Pulse Width: 0.5 ms
Lead Channel Setting Sensing Sensitivity: 4 mV
Pulse Gen Model: 1272
Pulse Gen Serial Number: 3805346

## 2021-11-08 ENCOUNTER — Other Ambulatory Visit: Payer: Self-pay | Admitting: Physician Assistant

## 2021-11-08 DIAGNOSIS — Z952 Presence of prosthetic heart valve: Secondary | ICD-10-CM

## 2021-11-14 NOTE — Progress Notes (Signed)
Remote pacemaker transmission.   

## 2022-02-06 ENCOUNTER — Ambulatory Visit (INDEPENDENT_AMBULATORY_CARE_PROVIDER_SITE_OTHER)

## 2022-02-06 DIAGNOSIS — I442 Atrioventricular block, complete: Secondary | ICD-10-CM

## 2022-02-07 LAB — CUP PACEART REMOTE DEVICE CHECK
Battery Remaining Longevity: 63 mo
Battery Remaining Percentage: 77 %
Battery Voltage: 3.01 V
Brady Statistic RV Percent Paced: 99 %
Date Time Interrogation Session: 20230531034632
Implantable Lead Implant Date: 20210517
Implantable Lead Location: 753860
Implantable Pulse Generator Implant Date: 20210517
Lead Channel Impedance Value: 450 Ohm
Lead Channel Pacing Threshold Amplitude: 0.75 V
Lead Channel Pacing Threshold Pulse Width: 0.5 ms
Lead Channel Sensing Intrinsic Amplitude: 6.4 mV
Lead Channel Setting Pacing Amplitude: 3.5 V
Lead Channel Setting Pacing Pulse Width: 0.5 ms
Lead Channel Setting Sensing Sensitivity: 4 mV
Pulse Gen Model: 1272
Pulse Gen Serial Number: 3805346

## 2022-02-08 IMAGING — CT CT ANGIO NECK
2 of 11 series · 7 of 35 positions shown · IV contrast (OMNI 350)
Comparison: MRI yesterday.

CLINICAL DATA: Follow-up stroke. Left hemiparesis and neglect.
Multiple bilateral micro embolic infarctions.

EXAM:
CT ANGIOGRAPHY HEAD AND NECK
TECHNIQUE: Multidetector CT imaging of the head and neck was performed using
the standard protocol during bolus administration of intravenous
contrast. Multiplanar CT image reconstructions and MIPs were
obtained to evaluate the vascular anatomy. Carotid stenosis
measurements (when applicable) are obtained utilizing NASCET
criteria, using the distal internal carotid diameter as the
denominator.
CONTRAST:  75mL OMNIPAQUE IOHEXOL 350 MG/ML SOLN

[Series 9: cta neck · axial · 0.45mm/px · z∈[-236,-112]mm · 2 of 188 slices shown]
[im 63/188  soft-tissue]
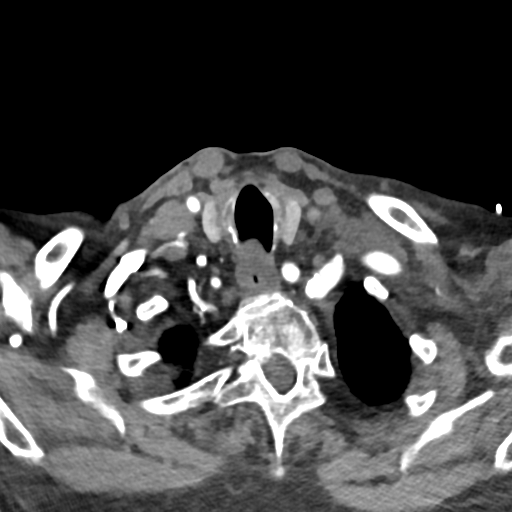
[im 125/188  soft-tissue]
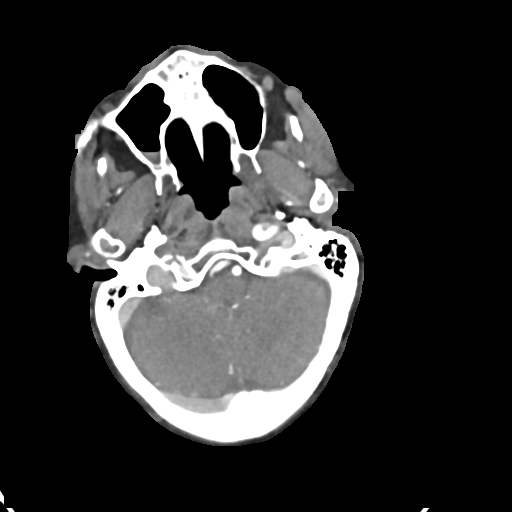

[Series 11: cta neck axial · axial · 0.39mm/px · z∈[-287,-40]mm · 5 of 375 slices shown]
[im 63/375  soft-tissue]
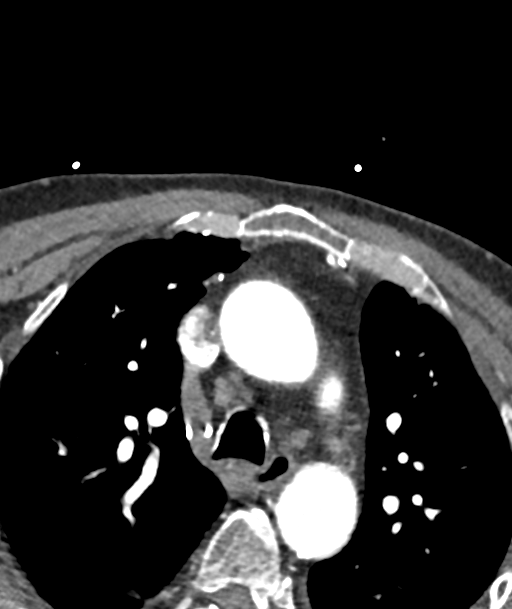
[im 125/375  bone]
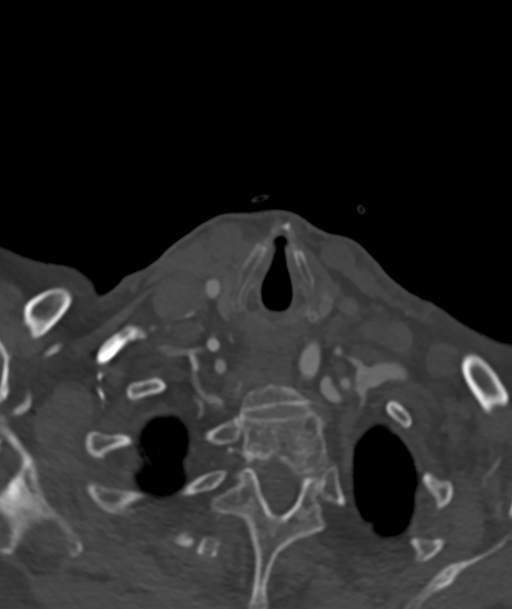
[im 188/375  soft-tissue]
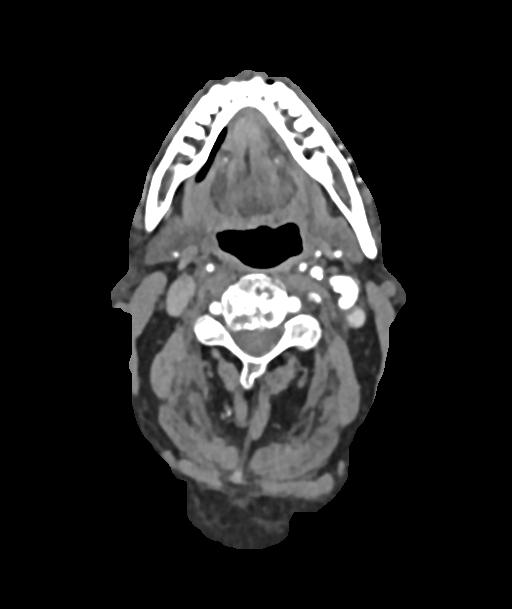
[im 250/375  bone]
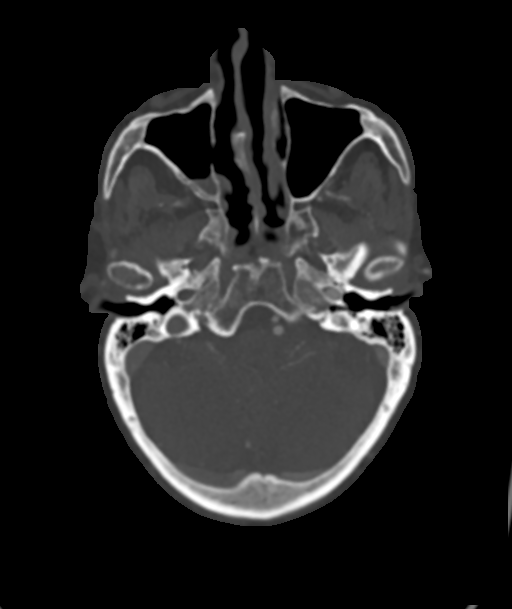
[im 312/375  soft-tissue]
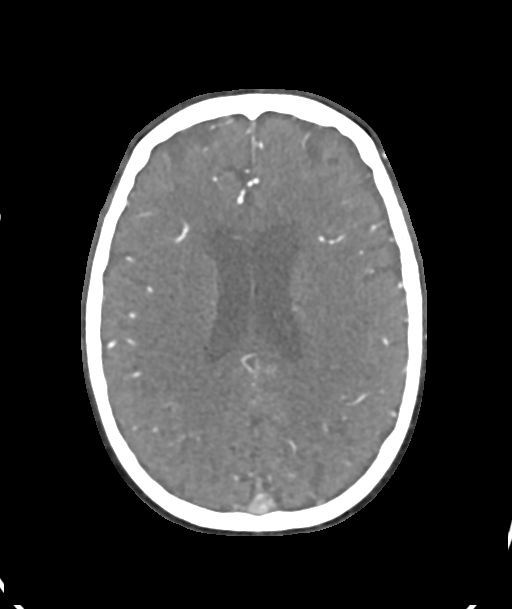

[7 of 35 positions shown; findings below may reference images not displayed]

FINDINGS: CT HEAD FINDINGS

Brain: Brain shows age related atrophy. Chronic small-vessel
ischemic changes seen throughout the hemispheric white matter. Some
of the low densities particularly on the right probably correlate
with the areas of acute infarction. No large confluent infarction.
No mass effect, hemorrhage, hydrocephalus or extra-axial collection.

Vascular: There is atherosclerotic calcification of the major
vessels at the base of the brain.

Skull: Negative

Sinuses: Previous functional endoscopic sinus surgery.

Orbits: Negative

Review of the MIP images confirms the above findings

CTA NECK FINDINGS

Aortic arch: Aortic atherosclerosis. Previous TAVR. Coronary artery
atherosclerosis and stents. Branching pattern of the aorta is normal
without origin stenosis.

Right carotid system: Common carotid artery shows some
atherosclerotic plaque but is widely patent to the bifurcation
region. Dense calcified plaque at the carotid bifurcation and ICA
bulb. Right ICA occlusion at the origin. No reconstitution.

Left carotid system: Common carotid artery shows some scattered
plaque but is widely patent to the bifurcation region. Calcified
plaque at the carotid bifurcation, ICA origin and ICA bulb. Severe
stenosis of the proximal ICA, difficulty to accurately measure
because of the dense calcification. Patent lumen is probably 1 mm or
smaller. Beyond that, cervical ICA is patent to the skull base.

Vertebral arteries: Large bilateral vertebral arteries. Dense
calcification at both vertebral artery origins. 70% stenosis on the
right. 50% stenosis on the left.

Skeleton: Exaggerated cervical lordosis and thoracic kyphosis.
Degenerative cervical spondylosis. Partial compression fracture of
T3, likely old.

Other neck: No mass or lymphadenopathy.

Upper chest: Emphysema and pulmonary scarring.

Review of the MIP images confirms the above findings

CTA HEAD FINDINGS

Anterior circulation: Left internal carotid artery is patent through
the skull base and siphon regions. Ordinary siphon atherosclerotic
calcification without stenosis greater than 30%. Supraclinoid ICA
widely patent. Good supply to the left anterior and middle cerebral
arteries. Patent anterior communicating artery allowing supply to
the right anterior circulation. No right internal carotid flow at
the skull base. Probable external to internal collaterals as well.
Patent right posterior communicating artery as well. No large or
medium vessel occlusion.

Posterior circulation: Both vertebral arteries widely patent through
the foramen magnum. Atherosclerotic calcification of the left V4
segment with stenosis estimated at 30%. No basilar stenosis. Dense
calcification at the basilar tip. This was not present on a head CT
12/28/2019, therefore this is an embolic calcification. There is
result in stenosis of both P1 origins, but those vessels do continue
to show flow.

Venous sinuses: Patent normal.

Anatomic variants: None significant.

Review of the MIP images confirms the above findings
IMPRESSION: Right ICA occlusion at the origin, precise age indeterminate. This
may well be chronic.

Aortic atherosclerosis.

Advanced atherosclerotic disease at the left carotid bifurcation and
proximal ICA. Severe stenosis of the proximal ICA with luminal
diameter estimated at 1 mm. This would be an 80% or greater
stenosis.

Stenotic disease at both vertebral artery origins. 70% stenosis on
the right. 50% stenosis on the left.

Dense calcification at the basilar tip consistent with embolized
plaque. This was not present on a CT scan 12/28/2019.

No acute intracranial large vessel occlusion. The embolized plaque
at the basilar tip does result in stenosis of both P1 origins, but
those vessels continue to show flow.

## 2022-02-10 IMAGING — DX DG CHEST 2V
2 series · 2 of 2 positions shown · non-contrast
Comparison: January 21, 2020.

CLINICAL DATA: Cardiac arrhythmia with pacemaker placement

EXAM:
CHEST - 2 VIEW

[chest lat]
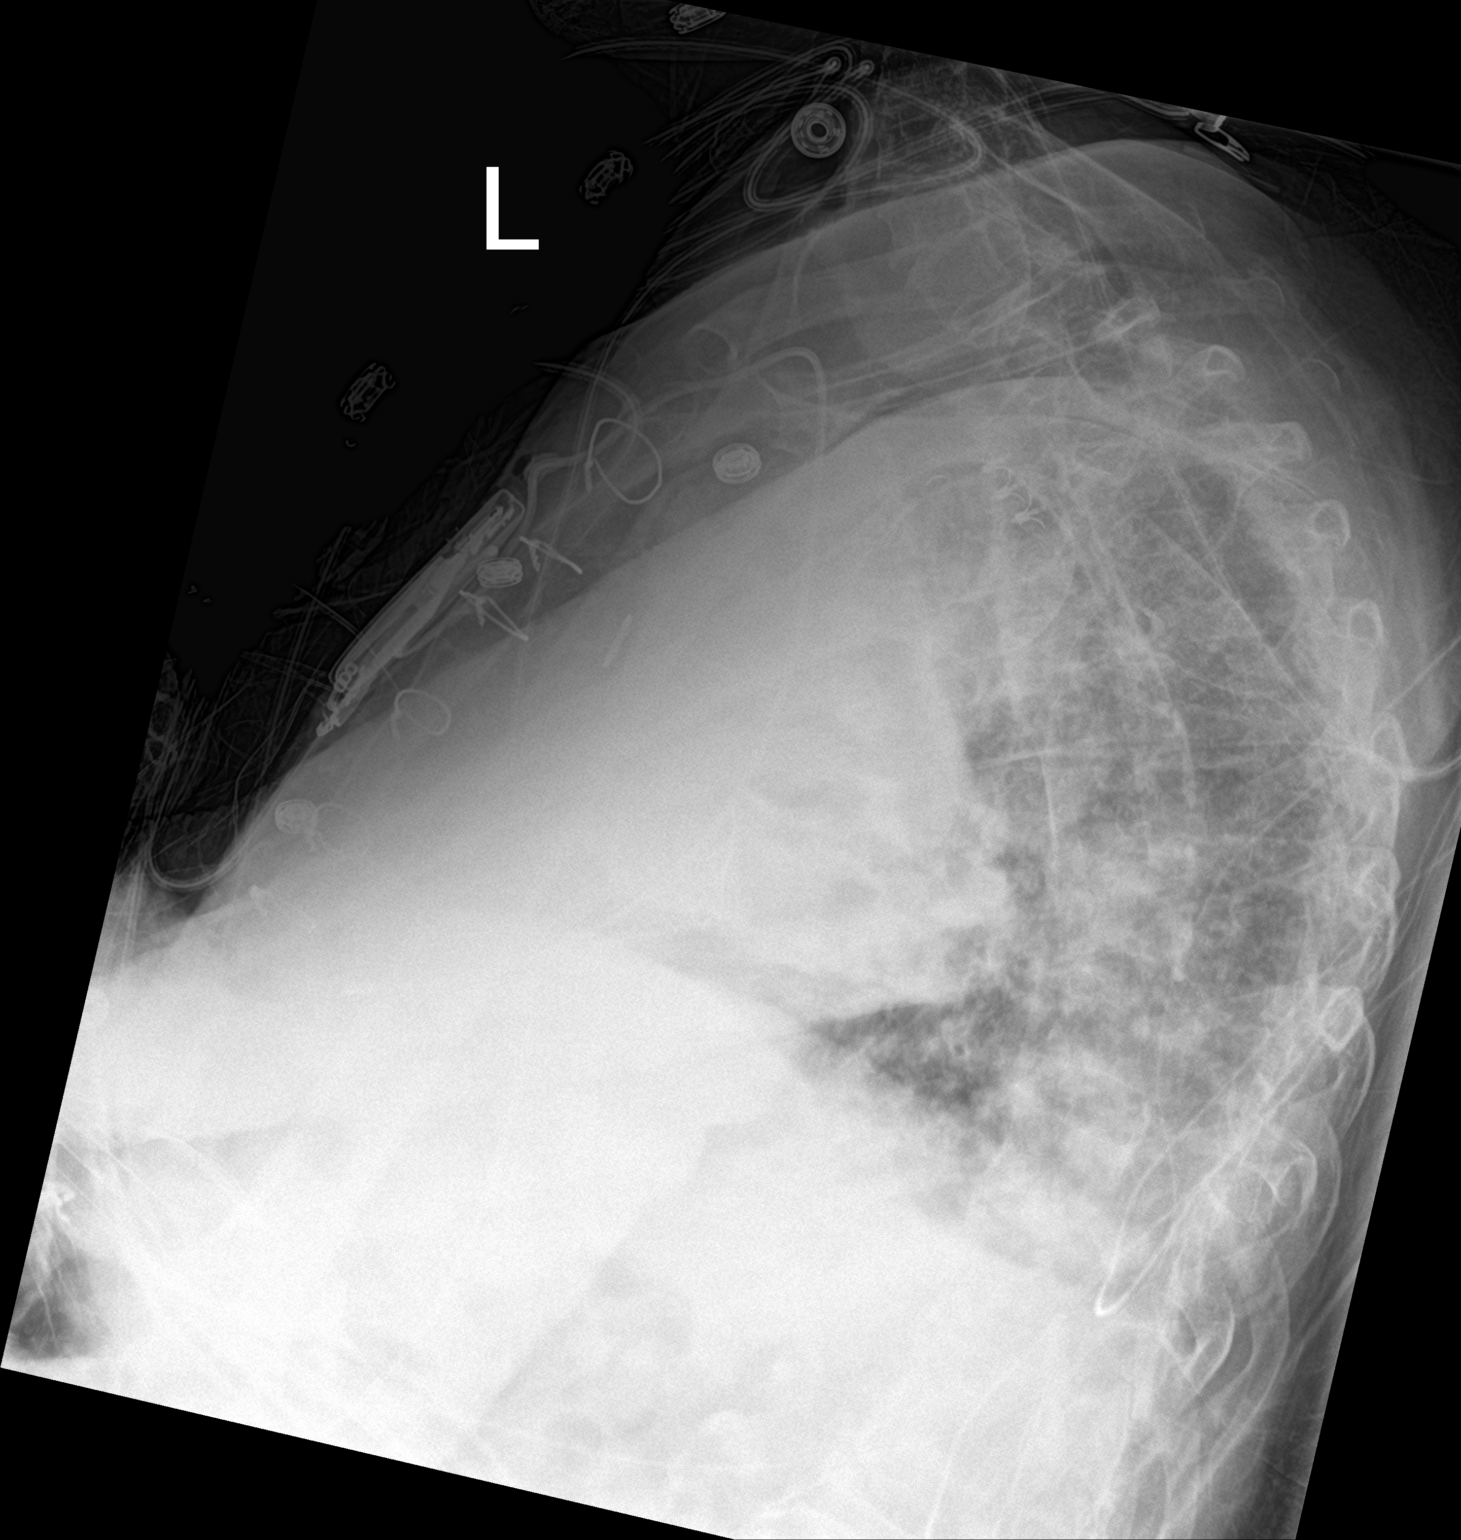

[chest ap]
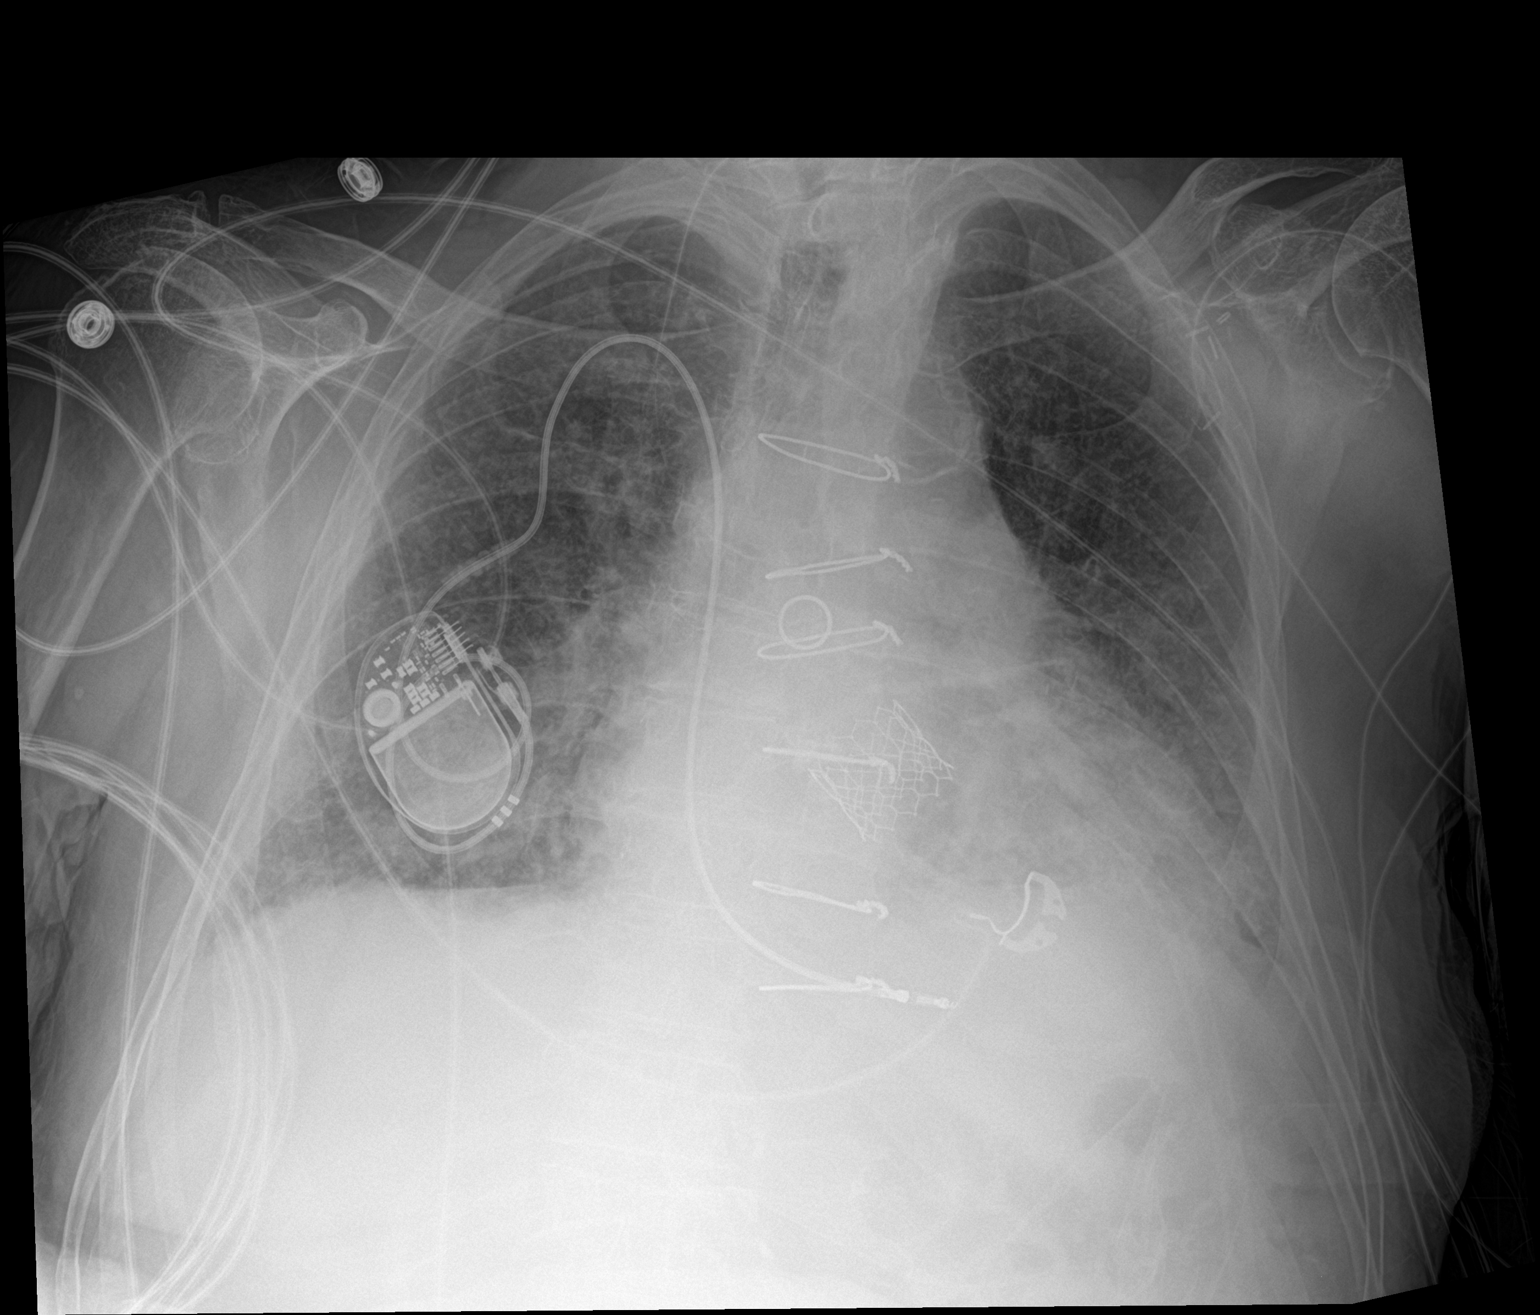

[2 of 2 positions shown; findings below may reference images not displayed]

FINDINGS: There is now a pacemaker on the right with lead tip attached to the
right ventricle. No pneumothorax. There is bibasilar atelectasis.
Lungs elsewhere are clear. Heart is slightly enlarged with pulmonary
vascularity within normal limits. Patient is status post coronary
artery bypass grafting and aortic valve replacement. There is aortic
atherosclerosis. Bones appear osteoporotic.
IMPRESSION: Pacemaker lead tip attached to right ventricle. No pneumothorax.
Bibasilar atelectasis. Stable cardiac prominence with postoperative
changes.

Aortic Atherosclerosis ([id number].H).

## 2022-02-20 NOTE — Progress Notes (Signed)
Remote pacemaker transmission.   

## 2022-04-09 NOTE — Progress Notes (Unsigned)
   Established Patient Office Visit  Subjective   Patient ID: Garrett Hughes, male   DOB: 02/26/1941 Age: 81 y.o. MRN: [id number]   No chief complaint on file.   HPI Pleasant 81 year old male presenting today accompanied by ***to transfer care to a new PCP.  He does have an extensive history, predominantly cardiac concerns and has recently been released from hospice.  He was given 2 weeks of his medications and instructed to follow-up with his PCP for further refills.  Unfortunately, his previous primary care provider has left our practice and he is now in need of a new one.  ROS    Objective:    There were no vitals filed for this visit.  Physical Exam   No results found for this or any previous visit (from the past 24 hour(s)).   {Labs (Optional):23779}  The ASCVD Risk score (Arnett DK, et al., 2019) failed to calculate for the following reasons:   The 2019 ASCVD risk score is only valid for ages 35 to 8   The patient has a prior MI or stroke diagnosis   Assessment & Plan:   No problem-specific Assessment & Plan notes found for this encounter.   No follow-ups on file.  ___________________________________________ Thayer Ohm, DNP, APRN, FNP-BC Primary Care and Sports Medicine [facility]

## 2022-04-10 ENCOUNTER — Encounter: Payer: Self-pay | Admitting: Medical-Surgical

## 2022-04-10 ENCOUNTER — Ambulatory Visit (INDEPENDENT_AMBULATORY_CARE_PROVIDER_SITE_OTHER): Payer: Medicare Other | Admitting: Medical-Surgical

## 2022-04-10 VITALS — BP 156/75 | HR 60 | Resp 20 | Ht 66.0 in

## 2022-04-10 DIAGNOSIS — E782 Mixed hyperlipidemia: Secondary | ICD-10-CM

## 2022-04-10 DIAGNOSIS — E1122 Type 2 diabetes mellitus with diabetic chronic kidney disease: Secondary | ICD-10-CM

## 2022-04-10 DIAGNOSIS — Z7689 Persons encountering health services in other specified circumstances: Secondary | ICD-10-CM | POA: Diagnosis not present

## 2022-04-10 DIAGNOSIS — I6521 Occlusion and stenosis of right carotid artery: Secondary | ICD-10-CM

## 2022-04-10 DIAGNOSIS — I10 Essential (primary) hypertension: Secondary | ICD-10-CM

## 2022-04-10 DIAGNOSIS — J449 Chronic obstructive pulmonary disease, unspecified: Secondary | ICD-10-CM

## 2022-04-10 DIAGNOSIS — K219 Gastro-esophageal reflux disease without esophagitis: Secondary | ICD-10-CM

## 2022-04-10 DIAGNOSIS — I482 Chronic atrial fibrillation, unspecified: Secondary | ICD-10-CM | POA: Diagnosis not present

## 2022-04-10 MED ORDER — HYDROCORTISONE (PERIANAL) 2.5 % EX CREA: 1.0000 | TOPICAL_CREAM | Freq: Two times a day (BID) | CUTANEOUS | 0 refills | Status: DC

## 2022-04-10 MED ORDER — HYDROCORTISONE ACETATE 25 MG RE SUPP
25.0000 mg | Freq: Two times a day (BID) | RECTAL | 2 refills | Status: DC | PRN
Start: 2022-04-10 — End: 2022-05-30

## 2022-04-10 MED ORDER — METOPROLOL TARTRATE 25 MG PO TABS: 12.5000 mg | ORAL_TABLET | Freq: Two times a day (BID) | ORAL | 1 refills | Status: DC

## 2022-04-10 MED ORDER — DOXAZOSIN MESYLATE 4 MG PO TABS: 4.0000 mg | ORAL_TABLET | Freq: Every day | ORAL | 1 refills | Status: DC

## 2022-04-10 MED ORDER — CLOPIDOGREL BISULFATE 75 MG PO TABS: 75.0000 mg | ORAL_TABLET | Freq: Every day | ORAL | 1 refills | Status: DC

## 2022-04-11 ENCOUNTER — Telehealth: Payer: Self-pay

## 2022-04-11 NOTE — Telephone Encounter (Signed)
Drenda Freeze from [facility] in [place] needs a verbal order to start home based palliative care that will provide nurse and social work on going.  Call back number: [phone]

## 2022-04-12 NOTE — Telephone Encounter (Signed)
Contacted Drenda Freeze and gave her the verbal order to start Palliative care (home based)

## 2022-04-24 ENCOUNTER — Telehealth: Payer: Self-pay

## 2022-04-24 NOTE — Telephone Encounter (Signed)
Clance Boll is a Engineer, civil (consulting) from [facility]. They enrolled the patient today in Palliative Care. He has a hospital bed from Hospice currently at his residence. The Bed is fully electric and Medicare will not pay for a fully electric bed unless he is in Hospice.  She is requesting for you to send a Rx to [facility] for a Hospital bed with rails and APMP mattress.  The family can decline to switch out the beds but they will have to pay out of pocket for the fully electric bed.  She also wanted to make you aware that the patient was complaining of hemorrhoid pain today.   Thana Ates contact number is [phone].

## 2022-04-25 MED ORDER — AMBULATORY NON FORMULARY MEDICATION: 0 refills | Status: DC

## 2022-04-25 NOTE — Telephone Encounter (Signed)
Prescription printed and signed. Please fax to [facility] as requested.   Please verify if the patient is using his Anusol rectal cream and suppositories for his hemorrhoids. He should also be working to increase PO fluids and fiber intake to combat constipation. Ok to use Miralax daily if needed.   ___________________________________________ Thayer Ohm, DNP, APRN, FNP-BC Primary Care and Sports Medicine [facility] [place]

## 2022-04-26 ENCOUNTER — Other Ambulatory Visit: Payer: Self-pay

## 2022-04-26 MED ORDER — PANTOPRAZOLE SODIUM 20 MG PO TBEC: 20.0000 mg | DELAYED_RELEASE_TABLET | Freq: Every day | ORAL | 0 refills | Status: DC

## 2022-04-29 ENCOUNTER — Other Ambulatory Visit: Payer: Self-pay | Admitting: Medical-Surgical

## 2022-04-29 MED ORDER — AMBULATORY NON FORMULARY MEDICATION: 0 refills | Status: DC

## 2022-05-01 ENCOUNTER — Encounter: Payer: Self-pay | Admitting: General Practice

## 2022-05-02 NOTE — Telephone Encounter (Signed)
Contacted [facility] to get a correct fax number for [facility].  I faxed the Rx to [phone] and received a successful confirmation.

## 2022-05-02 NOTE — Telephone Encounter (Signed)
Angie called from [facility]  She needs face to face notes with the narratives also qualifying for the mattress and a chronic diagnosis code. She will be faxing over the information also.  Fax #: [phone]  [phone]

## 2022-05-03 NOTE — Telephone Encounter (Signed)
Patient has been scheduled for 05/10/22. Patient has to check with daughter/daughter in law to make sure she can help bring patient in and make sure this date and time works and to make sure that they do not have to reschedule. AMUCK

## 2022-05-03 NOTE — Telephone Encounter (Signed)
Yes ma'am! Thank you! 

## 2022-05-03 NOTE — Telephone Encounter (Signed)
Is this okay to use a same day with Joy?

## 2022-05-08 ENCOUNTER — Ambulatory Visit (INDEPENDENT_AMBULATORY_CARE_PROVIDER_SITE_OTHER): Payer: Medicare Other

## 2022-05-08 DIAGNOSIS — I442 Atrioventricular block, complete: Secondary | ICD-10-CM

## 2022-05-08 LAB — CUP PACEART REMOTE DEVICE CHECK
Battery Remaining Longevity: 61 mo
Battery Remaining Percentage: 74 %
Battery Voltage: 2.99 V
Brady Statistic RV Percent Paced: 99 %
Date Time Interrogation Session: 20230830022549
Implantable Lead Implant Date: 20210517
Implantable Lead Location: 753860
Implantable Pulse Generator Implant Date: 20210517
Lead Channel Impedance Value: 460 Ohm
Lead Channel Pacing Threshold Amplitude: 0.75 V
Lead Channel Pacing Threshold Pulse Width: 0.5 ms
Lead Channel Sensing Intrinsic Amplitude: 7.2 mV
Lead Channel Setting Pacing Amplitude: 3.5 V
Lead Channel Setting Pacing Pulse Width: 0.5 ms
Lead Channel Setting Sensing Sensitivity: 4 mV
Pulse Gen Model: 1272
Pulse Gen Serial Number: 3805346

## 2022-05-10 ENCOUNTER — Ambulatory Visit: Payer: Medicare Other | Admitting: Medical-Surgical

## 2022-05-21 ENCOUNTER — Ambulatory Visit: Payer: Medicare Other | Admitting: Medical-Surgical

## 2022-05-23 ENCOUNTER — Telehealth: Payer: Self-pay

## 2022-05-23 NOTE — Telephone Encounter (Signed)
Keesha from [facility] called to let us know that Garrett Hughes has an appointment with you on 05/30/2022 and they are trying to prevent him from coming in the office due to his condition. The appointment is for a hospital bed and he currently has a hospital bed in his home and he's trying to keep it. He was a Hospice patient and now he is on the Palliative Program. She wants to know if there is information that they can fax over that they can tie up the loop.  He said that we're missing information is why he has to make the appointment.  Keesha's number is [phone].

## 2022-05-24 NOTE — Telephone Encounter (Signed)
[facility] with [facility] advised that pt needs face to face appt.  Thurston Hole expressed understanding and states that she will let the pt know.  Tiajuana Amass, CMA

## 2022-05-30 ENCOUNTER — Ambulatory Visit (INDEPENDENT_AMBULATORY_CARE_PROVIDER_SITE_OTHER): Payer: Medicare Other | Admitting: Medical-Surgical

## 2022-05-30 ENCOUNTER — Encounter: Payer: Self-pay | Admitting: Medical-Surgical

## 2022-05-30 VITALS — BP 119/69 | HR 60 | Resp 20 | Ht 66.0 in | Wt 125.0 lb

## 2022-05-30 DIAGNOSIS — R54 Age-related physical debility: Secondary | ICD-10-CM

## 2022-05-30 DIAGNOSIS — I69354 Hemiplegia and hemiparesis following cerebral infarction affecting left non-dominant side: Secondary | ICD-10-CM | POA: Diagnosis not present

## 2022-05-30 DIAGNOSIS — I5033 Acute on chronic diastolic (congestive) heart failure: Secondary | ICD-10-CM

## 2022-05-30 DIAGNOSIS — K649 Unspecified hemorrhoids: Secondary | ICD-10-CM | POA: Diagnosis not present

## 2022-05-30 DIAGNOSIS — J449 Chronic obstructive pulmonary disease, unspecified: Secondary | ICD-10-CM

## 2022-05-30 DIAGNOSIS — R3981 Functional urinary incontinence: Secondary | ICD-10-CM

## 2022-05-30 MED ORDER — DOXAZOSIN MESYLATE 4 MG PO TABS: 4.0000 mg | ORAL_TABLET | Freq: Every day | ORAL | 1 refills | Status: DC

## 2022-05-30 MED ORDER — CLOPIDOGREL BISULFATE 75 MG PO TABS: 75.0000 mg | ORAL_TABLET | Freq: Every day | ORAL | 1 refills | Status: DC

## 2022-05-30 MED ORDER — METOPROLOL TARTRATE 25 MG PO TABS: 12.5000 mg | ORAL_TABLET | Freq: Two times a day (BID) | ORAL | 1 refills | Status: DC

## 2022-05-30 MED ORDER — PANTOPRAZOLE SODIUM 20 MG PO TBEC: 20.0000 mg | DELAYED_RELEASE_TABLET | Freq: Every day | ORAL | 0 refills | Status: DC

## 2022-05-31 MED ORDER — AMBULATORY NON FORMULARY MEDICATION: 0 refills | Status: DC

## 2022-05-31 NOTE — Progress Notes (Signed)
Established Patient Office Visit  Subjective   Patient ID: Garrett Hughes, male   DOB: 1940/10/07 Age: 81 y.o. MRN: [id number]   Chief Complaint  Patient presents with   Follow-up    HPI Very pleasant 81 year old male accompanied by his wife and grandson presenting today to discuss getting a hospital bed.  He was previously admitted to hospice however his condition improved and he has now been discharged back to palliative care and PCP management.  Unfortunately, the bed that was provided by hospice is not covered by [employer] and they are requesting it be returned.  He has significant mobility issues and is a very high fall risk.  Having the rails on the hospital bed is imperative because he is very impulsive and will get up at night by himself.  With his limitations in mobility and strength, he is at high risk for injury should a fall occur during these times.  Additionally, he has urinary incontinence and reports that he leaks urine continuously.  Notes that he is unable to sleep on his back or flat on the bed due to orthopedic issues as well as chronic diastolic heart failure.  He needs to have his head elevated slightly at all times.  Due to his age-related debility and orthopedic concerns, he would also benefit a bed with variable height options to facilitate transfers from his chair/wheelchair.  Continues to complain of hemorrhoids that are very painful.  Has been using Anusol cream topically which helps some but does not fully relieve the discomfort.  He does have suppositories but does not use these regularly.  Notes that he had hemorrhoid surgery many years ago but they have come back.  Has not had any significant bleeding from the hemorrhoids and his biggest complaint is the pain.  Is interested in seeing someone who can take care of the hemorrhoid for him without having to give him anesthesia due to his history and post surgical stroke.  Objective:    Vitals:   05/30/22 1617   BP: 119/69  Pulse: 60  Resp: 20  Height: 5\' 6"  (1.676 m)  Weight: 125 lb (56.7 kg)  SpO2: 91%  BMI (Calculated): 20.19    Physical Exam Vitals reviewed.  Constitutional:      General: He is not in acute distress.    Appearance: Normal appearance. He is normal weight. He is not ill-appearing.  HENT:     Head: Normocephalic and atraumatic.  Cardiovascular:     Rate and Rhythm: Normal rate.     Pulses: Normal pulses.     Heart sounds: Normal heart sounds. No murmur heard.    No friction rub. No gallop.  Pulmonary:     Effort: Pulmonary effort is normal. No respiratory distress.     Breath sounds: Normal breath sounds.  Skin:    General: Skin is warm and dry.  Neurological:     Mental Status: He is alert and oriented to person, place, and time.  Psychiatric:        Mood and Affect: Mood normal.        Behavior: Behavior normal.        Thought Content: Thought content normal.        Judgment: Judgment normal.   No results found for this or any previous visit (from the past 24 hour(s)).     The ASCVD Risk score (Arnett DK, et al., 2019) failed to calculate for the following reasons:   The 2019 ASCVD risk score  is only valid for ages 87 to 38   The patient has a prior MI or stroke diagnosis   Assessment & Plan:   1. Hemorrhoids, unspecified hemorrhoid type Continue Anusol cream and/or suppositories.  Referring to GI for discussion of possible procedures that can be done without anesthesia.  Continue using a pressure reducing cushion while sitting in the chair or wheelchair. - Ambulatory referral to Gastroenterology  2. Acute on chronic diastolic CHF (congestive heart failure) ([place]) 3. Chronic obstructive pulmonary disease, unspecified COPD type ([place]) 4. Hemiplegia and hemiparesis following cerebral infarction affecting left non-dominant side (HCC) 5. Age-related physical debility 6. Functional urinary incontinence Discussed the need for hospital bed and reviewed his  chronic conditions.  Do feel this is an appropriate request for him and will be sending off a printed prescription to the DME company to get this in process.  He will also need rails as well as a pressure relief mattress.  Return in about 6 months (around 11/28/2022) for chronic disease follow up.  ___________________________________________ Clearnce Sorrel, DNP, APRN, FNP-BC Primary Care and [place]

## 2022-05-31 NOTE — Progress Notes (Signed)
Remote pacemaker transmission.   

## 2022-06-01 ENCOUNTER — Encounter: Payer: Self-pay | Admitting: Medical-Surgical

## 2022-06-01 DIAGNOSIS — R238 Other skin changes: Secondary | ICD-10-CM

## 2022-06-01 DIAGNOSIS — Z7409 Other reduced mobility: Secondary | ICD-10-CM

## 2022-06-01 DIAGNOSIS — R54 Age-related physical debility: Secondary | ICD-10-CM

## 2022-06-03 MED ORDER — CALMOSEPTINE 0.44-20.6 % EX OINT: 1.0000 | TOPICAL_OINTMENT | Freq: Three times a day (TID) | CUTANEOUS | 5 refills | Status: DC | PRN

## 2022-06-03 MED ORDER — AMBULATORY NON FORMULARY MEDICATION: 5 refills | Status: DC

## 2022-07-09 ENCOUNTER — Other Ambulatory Visit: Payer: Self-pay | Admitting: Medical-Surgical

## 2022-07-09 ENCOUNTER — Telehealth: Payer: Self-pay

## 2022-07-09 DIAGNOSIS — L89309 Pressure ulcer of unspecified buttock, unspecified stage: Secondary | ICD-10-CM

## 2022-07-09 NOTE — Telephone Encounter (Signed)
[place], Shavon's wife, is ok with the referral to home health. She is requesting something for the pain. She states he has swelling and this causes him a lot of pain.

## 2022-07-09 NOTE — Progress Notes (Signed)
Home health referral placed for wound care to pressure ulcers of the buttocks/sacral area. ___________________________________________ Clearnce Sorrel, DNP, APRN, FNP-BC Primary Care and [place]

## 2022-07-10 NOTE — Telephone Encounter (Signed)
Patient scheduled.

## 2022-07-11 ENCOUNTER — Encounter: Payer: Self-pay | Admitting: Medical-Surgical

## 2022-07-11 ENCOUNTER — Ambulatory Visit (INDEPENDENT_AMBULATORY_CARE_PROVIDER_SITE_OTHER): Payer: Medicare Other | Admitting: Medical-Surgical

## 2022-07-11 VITALS — BP 112/59 | HR 60 | Resp 20 | Ht 66.0 in

## 2022-07-11 DIAGNOSIS — L89152 Pressure ulcer of sacral region, stage 2: Secondary | ICD-10-CM | POA: Diagnosis not present

## 2022-07-11 DIAGNOSIS — J029 Acute pharyngitis, unspecified: Secondary | ICD-10-CM

## 2022-07-11 DIAGNOSIS — L89312 Pressure ulcer of right buttock, stage 2: Secondary | ICD-10-CM | POA: Diagnosis not present

## 2022-07-11 LAB — POCT INFLUENZA A/B
Influenza A, POC: NEGATIVE
Influenza B, POC: NEGATIVE

## 2022-07-11 LAB — POC COVID19 BINAXNOW: SARS Coronavirus 2 Ag: NEGATIVE

## 2022-07-11 MED ORDER — TRAZODONE HCL 50 MG PO TABS: 25.0000 mg | ORAL_TABLET | Freq: Every evening | ORAL | 3 refills | Status: DC | PRN

## 2022-07-11 NOTE — Progress Notes (Addendum)
Established Patient Office Visit  Subjective   Patient ID: Garrett Hughes, male   DOB: 30-Apr-1941 Age: 81 y.o. MRN: [id number]   Chief Complaint  Patient presents with   Sore Throat   Rectal Pain    HPI Pleasant 81 year old male accompanied by his wife presenting today for evaluation of pain at his buttocks.  He is very sedentary and sits most of the time.  He has had some irritation on his bilateral buttocks and sacral area that his wife has been combating at home with skin care ointment and Mepilex pads to protect the area.  He sits in a wheelchair quite a bit although he does have a pressure relieving pad.  He also sits in a chair at home on a regular basis, rarely lying down and unable to lie flat.  Tends to nap in the evenings and then stay awake all night getting up and down frequently.  He does have issues with urinary incontinence and they work to try and keep him as dry as possible.  He wears adult diapers and tries to use the bathroom whenever possible.  No stool incontinence.  Over the last several weeks, he has noted a worsening of the pain in his buttocks and sacrum.  Also notes that he has hemorrhoid pain however this pain is a bit different.  His wife has been able to find the cream as well as the pads that they have been using to protect the skin but this is still very painful for him.  They are interested in home health for wound care.  Notes that he developed a sore throat yesterday that has continued to affect him today.  Reports that he is able to eat and drink okay but his wife says that he had trouble getting a banana down earlier.  No known exposure to anyone who has been ill as he rarely leaves the house.  No other associated symptoms.   Objective:    Vitals:   07/11/22 1517  BP: (!) 112/59  Pulse: 60  Resp: 20  Height: 5\' 6"  (1.676 m)  Weight: Comment: WHEEL CHAIR  SpO2: 90%    Physical Exam Vitals reviewed.  Constitutional:      General: He is not in acute  distress.    Appearance: Normal appearance. He is well-developed. He is not ill-appearing.  HENT:     Head: Normocephalic and atraumatic.     Mouth/Throat:     Mouth: Mucous membranes are moist.     Pharynx: Oropharynx is clear. Posterior oropharyngeal erythema present.  Cardiovascular:     Rate and Rhythm: Normal rate.     Pulses: Normal pulses.     Heart sounds: Normal heart sounds. No murmur heard.    No friction rub. No gallop.  Pulmonary:     Effort: Pulmonary effort is normal. No respiratory distress.     Breath sounds: Normal breath sounds.  Skin:    General: Skin is warm and dry.       Neurological:     Mental Status: He is alert and oriented to person, place, and time.  Psychiatric:        Mood and Affect: Mood normal.        Behavior: Behavior normal.        Thought Content: Thought content normal.        Judgment: Judgment normal.    Results for orders placed or performed in visit on 07/11/22 (from the past 24 hour(s))  POCT  Influenza A/B     Status: None   Collection Time: 07/11/22  4:40 PM  Result Value Ref Range   Influenza A, POC Negative Negative   Influenza B, POC Negative Negative  POC COVID-19     Status: None   Collection Time: 07/11/22  4:41 PM  Result Value Ref Range   SARS Coronavirus 2 Ag Negative Negative       The ASCVD Risk score (Arnett DK, et al., 2019) failed to calculate for the following reasons:   The 2019 ASCVD risk score is only valid for ages 7 to 78   The patient has a prior MI or stroke diagnosis   Assessment & Plan:   1. Sore throat POCT COVID and flu swabs both negative.  Discussed conservative management with over-the-counter products such as Chloraseptic, Cepacol, Tylenol, etc.  Recommend warm fluids such as tea with honey, coffee, or hot chocolate.  If desired, can try cold fluids such as popsicles or ice cream.  Recommend salt water gargles several times daily. - POC COVID-19 - POCT Influenza A/B  2. Pressure injury of  right buttock, stage 2 (HCC) 3. Pressure injury of sacral region, stage 2 [facility]) Referral placed for home health for wound care.  Recommend continuing to use the ointment and pads for relieving pressure.  Reviewed recommendations for very frequent position changes to prevent worsening pressure.  Discussed recommendations for increased activity to reduce time sitting.  Keep the area as clean and dry as possible and change adult diapers frequently to avoid excess moisture exposure.  Return if symptoms worsen or fail to improve.  ___________________________________________ Thayer Ohm, DNP, APRN, FNP-BC Primary Care and Sports Medicine [facility] [place]

## 2022-07-15 ENCOUNTER — Telehealth: Payer: Medicare Other | Admitting: Medical-Surgical

## 2022-07-22 ENCOUNTER — Ambulatory Visit: Payer: Medicare Other | Admitting: Medical-Surgical

## 2022-08-03 ENCOUNTER — Other Ambulatory Visit: Payer: Self-pay | Admitting: Medical-Surgical

## 2022-08-07 ENCOUNTER — Ambulatory Visit (INDEPENDENT_AMBULATORY_CARE_PROVIDER_SITE_OTHER): Payer: Medicare Other

## 2022-08-07 DIAGNOSIS — I442 Atrioventricular block, complete: Secondary | ICD-10-CM

## 2022-08-07 LAB — CUP PACEART REMOTE DEVICE CHECK
Battery Remaining Longevity: 59 mo
Battery Remaining Percentage: 71 %
Battery Voltage: 2.99 V
Brady Statistic RV Percent Paced: 99 %
Date Time Interrogation Session: 20231129020015
Implantable Lead Connection Status: 753985
Implantable Lead Implant Date: 20210517
Implantable Lead Location: 753860
Implantable Pulse Generator Implant Date: 20210517
Lead Channel Impedance Value: 480 Ohm
Lead Channel Pacing Threshold Amplitude: 0.75 V
Lead Channel Pacing Threshold Pulse Width: 0.5 ms
Lead Channel Sensing Intrinsic Amplitude: 7.2 mV
Lead Channel Setting Pacing Amplitude: 3.5 V
Lead Channel Setting Pacing Pulse Width: 0.5 ms
Lead Channel Setting Sensing Sensitivity: 4 mV
Pulse Gen Model: 1272
Pulse Gen Serial Number: 3805346

## 2022-08-14 DIAGNOSIS — E1129 Type 2 diabetes mellitus with other diabetic kidney complication: Secondary | ICD-10-CM | POA: Diagnosis not present

## 2022-08-14 DIAGNOSIS — I5033 Acute on chronic diastolic (congestive) heart failure: Secondary | ICD-10-CM | POA: Diagnosis not present

## 2022-08-14 DIAGNOSIS — L89152 Pressure ulcer of sacral region, stage 2: Secondary | ICD-10-CM | POA: Diagnosis not present

## 2022-08-14 DIAGNOSIS — I272 Pulmonary hypertension, unspecified: Secondary | ICD-10-CM | POA: Diagnosis not present

## 2022-08-14 DIAGNOSIS — I119 Hypertensive heart disease without heart failure: Secondary | ICD-10-CM | POA: Diagnosis not present

## 2022-08-14 DIAGNOSIS — L89312 Pressure ulcer of right buttock, stage 2: Secondary | ICD-10-CM | POA: Diagnosis not present

## 2022-08-20 ENCOUNTER — Other Ambulatory Visit: Payer: Self-pay | Admitting: Medical-Surgical

## 2022-08-20 DIAGNOSIS — L89312 Pressure ulcer of right buttock, stage 2: Secondary | ICD-10-CM | POA: Diagnosis not present

## 2022-08-20 DIAGNOSIS — I5033 Acute on chronic diastolic (congestive) heart failure: Secondary | ICD-10-CM | POA: Diagnosis not present

## 2022-08-20 DIAGNOSIS — I119 Hypertensive heart disease without heart failure: Secondary | ICD-10-CM | POA: Diagnosis not present

## 2022-08-20 DIAGNOSIS — L89152 Pressure ulcer of sacral region, stage 2: Secondary | ICD-10-CM | POA: Diagnosis not present

## 2022-08-20 DIAGNOSIS — I272 Pulmonary hypertension, unspecified: Secondary | ICD-10-CM | POA: Diagnosis not present

## 2022-08-20 DIAGNOSIS — E1129 Type 2 diabetes mellitus with other diabetic kidney complication: Secondary | ICD-10-CM | POA: Diagnosis not present

## 2022-08-26 ENCOUNTER — Telehealth: Payer: Self-pay

## 2022-08-26 DIAGNOSIS — I119 Hypertensive heart disease without heart failure: Secondary | ICD-10-CM | POA: Diagnosis not present

## 2022-08-26 DIAGNOSIS — I272 Pulmonary hypertension, unspecified: Secondary | ICD-10-CM | POA: Diagnosis not present

## 2022-08-26 DIAGNOSIS — L89152 Pressure ulcer of sacral region, stage 2: Secondary | ICD-10-CM | POA: Diagnosis not present

## 2022-08-26 DIAGNOSIS — L89312 Pressure ulcer of right buttock, stage 2: Secondary | ICD-10-CM | POA: Diagnosis not present

## 2022-08-26 DIAGNOSIS — E1129 Type 2 diabetes mellitus with other diabetic kidney complication: Secondary | ICD-10-CM | POA: Diagnosis not present

## 2022-08-26 DIAGNOSIS — I5033 Acute on chronic diastolic (congestive) heart failure: Secondary | ICD-10-CM | POA: Diagnosis not present

## 2022-08-26 NOTE — Telephone Encounter (Signed)
Yehuda Mao - RN from [facility]  called needing a verbal order to discharge the patient from Home Health because his wound to his sacrum is now healed.  LeeAnn's number is [phone]

## 2022-08-28 NOTE — Telephone Encounter (Signed)
Contacted [facility] to verify that they have received the fax from 08/27/2022.  They confirmed that they received the form to decrease wound care.

## 2022-09-03 ENCOUNTER — Other Ambulatory Visit: Payer: Self-pay | Admitting: Medical-Surgical

## 2022-09-04 NOTE — Progress Notes (Signed)
Remote pacemaker transmission.   

## 2022-10-07 DIAGNOSIS — I5032 Chronic diastolic (congestive) heart failure: Secondary | ICD-10-CM | POA: Diagnosis not present

## 2022-10-07 DIAGNOSIS — I130 Hypertensive heart and chronic kidney disease with heart failure and stage 1 through stage 4 chronic kidney disease, or unspecified chronic kidney disease: Secondary | ICD-10-CM | POA: Diagnosis not present

## 2022-10-09 DIAGNOSIS — I5032 Chronic diastolic (congestive) heart failure: Secondary | ICD-10-CM | POA: Diagnosis not present

## 2022-10-09 DIAGNOSIS — I130 Hypertensive heart and chronic kidney disease with heart failure and stage 1 through stage 4 chronic kidney disease, or unspecified chronic kidney disease: Secondary | ICD-10-CM | POA: Diagnosis not present

## 2022-10-30 ENCOUNTER — Other Ambulatory Visit: Payer: Self-pay | Admitting: Medical-Surgical

## 2022-11-06 ENCOUNTER — Ambulatory Visit: Payer: Medicare Other

## 2022-11-06 DIAGNOSIS — I442 Atrioventricular block, complete: Secondary | ICD-10-CM | POA: Diagnosis not present

## 2022-11-06 LAB — CUP PACEART REMOTE DEVICE CHECK
Battery Remaining Longevity: 55 mo
Battery Remaining Percentage: 68 %
Battery Voltage: 2.99 V
Brady Statistic RV Percent Paced: 99 %
Date Time Interrogation Session: 20240228025131
Implantable Lead Connection Status: 753985
Implantable Lead Implant Date: 20210517
Implantable Lead Location: 753860
Implantable Pulse Generator Implant Date: 20210517
Lead Channel Impedance Value: 430 Ohm
Lead Channel Pacing Threshold Amplitude: 0.75 V
Lead Channel Pacing Threshold Pulse Width: 0.5 ms
Lead Channel Sensing Intrinsic Amplitude: 11 mV
Lead Channel Setting Pacing Amplitude: 3.5 V
Lead Channel Setting Pacing Pulse Width: 0.5 ms
Lead Channel Setting Sensing Sensitivity: 4 mV
Pulse Gen Model: 1272
Pulse Gen Serial Number: 3805346

## 2022-11-11 ENCOUNTER — Other Ambulatory Visit: Payer: Self-pay | Admitting: Medical-Surgical

## 2022-11-20 ENCOUNTER — Telehealth: Payer: Self-pay | Admitting: Medical-Surgical

## 2022-11-20 NOTE — Telephone Encounter (Signed)
Contacted Garrett Hughes to schedule their annual wellness visit. Appointment made for 11/22/2022 at [facility] Direct Dial: [phone]

## 2022-11-22 ENCOUNTER — Ambulatory Visit (INDEPENDENT_AMBULATORY_CARE_PROVIDER_SITE_OTHER): Payer: Medicare Other | Admitting: Medical-Surgical

## 2022-11-22 DIAGNOSIS — Z0000 Encounter for general adult medical examination without abnormal findings: Secondary | ICD-10-CM

## 2022-11-22 NOTE — Progress Notes (Signed)
MEDICARE ANNUAL WELLNESS VISIT  11/22/2022  Telephone Visit Disclaimer This Medicare AWV was conducted by telephone due to national recommendations for restrictions regarding the COVID-19 Pandemic (e.g. social distancing).  I verified, using two identifiers, that I am speaking with Garrett Hughes or their authorized healthcare agent. I discussed the limitations, risks, security, and privacy concerns of performing an evaluation and management service by telephone and the potential availability of an in-person appointment in the future. The patient expressed understanding and agreed to proceed.  Location of Patient: Home with wife.  Location of Provider (nurse):  Provider home  Subjective:    Garrett Hughes is a 82 y.o. male patient of Samuel Bouche, NP who had a Medicare Annual Wellness Visit today via telephone. Garrett Hughes is Retired and lives with their spouse. he has 2 children. he reports that he is socially active and does interact with friends/family regularly. he is minimally physically active and enjoys watching television.  Patient Care Team: Samuel Bouche, NP as PCP - General (Nurse Practitioner) Lelon Perla, MD as PCP - Cardiology (Cardiology)     11/22/2022    9:34 AM 01/18/2020    4:30 PM 01/13/2020    9:33 AM 01/13/2020    8:23 AM 01/05/2020    3:50 PM 12/28/2019    7:52 PM 12/28/2019    1:31 AM  Advanced Directives  Does Patient Have a Medical Advance Directive? Yes Yes Yes Yes Yes No No  Type of Advance Directive Living will [place];Living will [place];Living will [place];Living will [place];Living will [employer];Living will   Does patient want to make changes to medical advance directive? No - Patient declined No - Patient declined  No - Patient declined No - Patient declined No - Patient declined No - Patient declined  Copy of [place] in Chart?  No - copy  requested No - copy requested No - copy requested     Would patient like information on creating a medical advance directive?    No - Patient declined  No - Patient declined No - Patient declined    Hospital Utilization Over the Past 12 Months: # of hospitalizations or ER visits: 0 # of surgeries: 0  Review of Systems    Patient reports that his overall health is unchanged compared to last year.  History obtained from chart review and the patient  Patient Reported Readings (BP, Pulse, CBG, Weight, etc) none  Pain Assessment Pain : 0-10 Pain Score: 8  Pain Type: Chronic pain Pain Location: Other (Comment) (Hemmorhoids) Pain Descriptors / Indicators: Aching Pain Onset: More than a month ago Pain Frequency: Constant     Current Medications & Allergies (verified) Allergies as of 11/22/2022       Reactions   Atorvastatin    Other reaction(s): Myalgias (intolerance)   Cholestyramine Other (See Comments)   Constipation   Pravastatin Other (See Comments)   Does not tolerate high doses        Medication List        Accurate as of November 22, 2022  9:44 AM. If you have any questions, ask your nurse or doctor.          AMBULATORY NON FORMULARY MEDICATION Please provide a hospital bed with rails and APMP mattress Send to [facility] Medication Name: [facility] bed, semi-electric, variable height with side rails and group 1  pressure reducing mattress. LIFETIME   AMBULATORY NON FORMULARY MEDICATION Medication Name: Mepilex border sacrum 16x20cm patches. Apply 1 patch to the sacrum/buttocks daily and as needed if soiled. Dx: Impaired mobility Z74.09, Age-related physical debility R54, Breakdown of skin tissue R23.8   Calmoseptine 0.44-20.6 % Oint Generic drug: Menthol-Zinc Oxide Apply 1 Application topically 3 (three) times daily as needed.   clopidogrel 75 MG tablet Commonly known as: PLAVIX TAKE 1 TABLET BY MOUTH DAILY    doxazosin 4 MG tablet Commonly known as: CARDURA TAKE 1 TABLET BY MOUTH DAILY   metoprolol tartrate 25 MG tablet Commonly known as: LOPRESSOR TAKE ONE-HALF TABLET BY MOUTH  TWICE DAILY   pantoprazole 20 MG tablet Commonly known as: PROTONIX TAKE 1 TABLET BY MOUTH DAILY   traZODone 50 MG tablet Commonly known as: DESYREL Take 0.5-1 tablets (25-50 mg total) by mouth at bedtime as needed for sleep. NO REFILLS. NEEDS AN APPT W/PCP.        History (reviewed): Past Medical History:  Diagnosis Date   Anemia    Arthritis    Atherosclerosis of native coronary artery of native heart without angina pectoris 01/05/2016   Benign essential hypertension 01/05/2016   Carotid artery disease ([place])    Cataract, nuclear, right 05/06/2014   Chronic atrial fibrillation (HCC) 12/20/2015   Chronic diarrhea    COPD (chronic obstructive pulmonary disease) ([place])    on nocturnal O2   Current use of long term anticoagulation 12/20/2015   Diabetic kidney disease ([place]) 01/05/2016   GERD (gastroesophageal reflux disease)    GI bleed    History of Barrett's esophagus 12/20/2015   History of blood in urine    History of kidney stones    Hx of CABG 07/03/2016   Overview:  1997, LIMA- LAD, RIMA to RCA, SVG to OM   Hyperlipemia 10/26/2015   Low back pain 05/02/2016   S/P TAVR (transcatheter aortic valve replacement) 01/18/2020   s/p successful TAVR w/ a 26 mm Edwards S3U via the left subclavian approach (failed Shockwave TF) with Dr. Burt Knack and Dr. Roxy Manns    Severe aortic stenosis    Thrombocytopenia ([place])    Type 2 diabetes mellitus with kidney complication, without long-term current use of insulin ([place]) 07/03/2016   Past Surgical History:  Procedure Laterality Date   AORTOGRAM  01/18/2020   Procedure: Aortogram;  Surgeon: Sherren Mocha, MD;  Location: [facility] OR;  Service: Open Heart Surgery;;   BACK SURGERY  2004   [place]   CORONARY ATHERECTOMY N/A  12/24/2019   Procedure: CORONARY ATHERECTOMY;  Surgeon: Sherren Mocha, MD;  Location: [facility];  Service: Cardiovascular;  Laterality: N/A;   INTRAOPERATIVE TRANSESOPHAGEAL ECHOCARDIOGRAM  01/18/2020   Procedure: Intraoperative Transesophageal Echocardiogram;  Surgeon: Sherren Mocha, MD;  Location: [facility];  Service: Open Heart Surgery;;   INTRAOPERATIVE TRANSTHORACIC ECHOCARDIOGRAM N/A 01/18/2020   Procedure: Intraoperative Transthoracic Echocardiogram;  Surgeon: Sherren Mocha, MD;  Location: [facility];  Service: Open Heart Surgery;  Laterality: N/A;   LOWER EXTREMITY ANGIOGRAM  01/18/2020   Procedure: Lower Extremity Angiogram;  Surgeon: Sherren Mocha, MD;  Location: [facility];  Service: Open Heart Surgery;;  Shockwave   PACEMAKER IMPLANT N/A 01/24/2020   Procedure: PACEMAKER IMPLANT;  Surgeon: Deboraha Sprang, MD;  Location: [facility];  Service: Cardiovascular;  Laterality: N/A;   RIGHT HEART CATH AND CORONARY/GRAFT ANGIOGRAPHY N/A 11/26/2019   Procedure: RIGHT HEART CATH AND CORONARY/GRAFT ANGIOGRAPHY;  Surgeon: Sherren Mocha, MD;  Location: [facility];  Service: Cardiovascular;  Laterality: N/A;   SHOULDER SURGERY  2005   TRANSCATHETER AORTIC VALVE REPLACEMENT, TRANSFEMORAL Left 01/18/2020   Procedure: Transcatheter Aortic Valve Replacement (subclavian approach);  Surgeon: Sherren Mocha, MD;  Location: [facility];  Service: Open Heart Surgery;  Laterality: Left;   Family History  Problem Relation Age of Onset   CAD Father    Colon cancer Neg Hx    Heart attack Neg Hx    Social History   Socioeconomic History   Marital status: Married    Spouse name: Edd Fabian   Number of children: 2   Years of education: 12   Highest education level: 12th grade  Occupational History   Occupation: truck Geophysicist/field seismologist    Comment: retired  Tobacco Use   Smoking status: Former    Types: Cigarettes    Quit date: 09/10/1987    Years since quitting: 35.2   Smokeless tobacco: Never  Vaping Use    Vaping Use: Never used  Substance and Sexual Activity   Alcohol use: No   Drug use: No   Sexual activity: Not Currently  Other Topics Concern   Not on file  Social History Narrative   Lives with his wife and his sister. He has two children. He enjoys watching television.   Social Determinants of Health   Financial Resource Strain: Low Risk  (11/22/2022)   Overall Financial Resource Strain (CARDIA)    Difficulty of Paying Living Expenses: Not hard at all  Food Insecurity: No Food Insecurity (11/22/2022)   Hunger Vital Sign    Worried About Running Out of Food in the Last Year: Never true    Ran Out of Food in the Last Year: Never true  Transportation Needs: No Transportation Needs (11/22/2022)   PRAPARE - Hydrologist (Medical): No    Lack of Transportation (Non-Medical): No  Physical Activity: Inactive (11/22/2022)   Exercise Vital Sign    Days of Exercise per Week: 0 days    Minutes of Exercise per Session: 0 min  Stress: No Stress Concern Present (11/22/2022)   [place]    Feeling of Stress : Not at all  Social Connections: Moderately Integrated (11/22/2022)   Social Connection and Isolation Panel [NHANES]    Frequency of Communication with Friends and Family: Never    Frequency of Social Gatherings with Friends and Family: More than three times a week    Attends Religious Services: 1 to 4 times per year    Active Member of [employer] or Organizations: No    Attends Archivist Meetings: Never    Marital Status: Married    Activities of Daily Living    11/22/2022    9:35 AM  In your present state of health, do you have any difficulty performing the following activities:  Hearing? 0  Vision? 0  Difficulty concentrating or making decisions? 1  Comment some difficulty  Walking or climbing stairs? 1  Comment uses a wheel chair  Dressing or bathing? 1  Comment his wife  helps with bathing  Doing errands, shopping? 1  Comment his wife takes him to his appointments  Preparing Food and eating ? Y  Comment his wife manages that but he can feed himself  Using the Toilet? N  In the past six months, have you accidently leaked urine? Y  Comment a lot of leakage  Do you  have problems with loss of bowel control? N  Managing your Medications? Y  Comment his wife manages that  Managing your Finances? Y  Comment his wife manages that  Housekeeping or managing your Housekeeping? Y  Comment his wife manages that    Patient Education/ Literacy How often do you need to have someone help you when you read instructions, pamphlets, or other written materials from your doctor or pharmacy?: 1 - Never What is the last grade level you completed in school?: 12th grade  Exercise Current Exercise Habits: The patient does not participate in regular exercise at present, Exercise limited by: orthopedic condition(s)  Diet Patient reports consuming 3 meals a day and 1-2 snack(s) a day Patient reports that his primary diet is: Regular Patient reports that she does have regular access to food.   Depression Screen    11/22/2022    9:37 AM 05/30/2022    4:14 PM 01/05/2020    3:50 PM 10/13/2019   10:53 AM 05/18/2019    8:23 AM 10/12/2018   11:05 AM 09/14/2018    8:38 AM  PHQ 2/9 Scores  PHQ - 2 Score 0 0 0 0  0 0  PHQ- 9 Score      3 8  Exception Documentation     Patient refusal       Fall Risk    11/22/2022    9:37 AM 05/30/2022    4:14 PM 01/05/2020    3:50 PM 10/13/2019   10:52 AM 10/12/2018   11:05 AM  [employer] in the past year? 0 0 0 0 0  Number falls in past yr: 0 0 0    Injury with Fall? 0 0 0    Comment   N/A- no falls reported    Risk for fall due to : Impaired mobility No Fall Risks No Fall Risks No Fall Risks   Follow up Falls evaluation completed Falls evaluation completed Falls prevention discussed Falls prevention discussed Falls prevention discussed      Objective:  Garrett Hughes seemed alert and oriented and he participated appropriately during our telephone visit.  Blood Pressure Weight BMI  BP Readings from Last 3 Encounters:  07/11/22 (!) 112/59  05/30/22 119/69  04/10/22 (!) 156/75   Wt Readings from Last 3 Encounters:  05/30/22 125 lb (56.7 kg)  10/26/20 125 lb (56.7 kg)  05/11/20 110 lb (49.9 kg)   BMI Readings from Last 1 Encounters:  07/11/22 20.18 kg/m    *Unable to obtain current vital signs, weight, and BMI due to telephone visit type  Hearing/Vision  Garrett Hughes did not seem to have difficulty with hearing/understanding during the telephone conversation Reports that he has not had a formal eye exam by an eye care professional within the past year Reports that he has not had a formal hearing evaluation within the past year *Unable to fully assess hearing and vision during telephone visit type  Cognitive Function:    11/22/2022    9:40 AM 10/13/2019   10:55 AM 10/12/2018   11:09 AM  6CIT Screen  What Year? 4 points 0 points 0 points  What month? 3 points 0 points 0 points  What time? 0 points 0 points 0 points  Count back from 20 4 points 2 points 0 points  Months in reverse 4 points 2 points 0 points  Repeat phrase 0 points 2 points 0 points  Total Score 15 points 6 points 0 points   (Normal:0-7, Significant for  Dysfunction: >8)  Normal Cognitive Function Screening: No: Patient has an appointment with primary care doctor next week.    Immunization & Health Maintenance Record Immunization History  Administered Date(s) Administered   Fluad Quad(high Dose 65+) 05/18/2019   Influenza, High Dose Seasonal PF 09/14/2018, 07/13/2020   Influenza,inj,Quad PF,6+ Mos 05/06/2017   Tdap 09/14/2018    Health Maintenance  Topic Date Due   COVID-19 Vaccine (1) Never done   FOOT EXAM  Never done   OPHTHALMOLOGY EXAM  Never done   Zoster Vaccines- Shingrix (1 of 2) Never done   HEMOGLOBIN A1C  07/25/2020   INFLUENZA  VACCINE  12/08/2022 (Originally 04/09/2022)   Pneumonia Vaccine 52+ Years old (1 of 2 - PCV) 05/31/2023 (Originally 10/23/1946)   DTaP/Tdap/Td (2 - Td or Tdap) 09/14/2028   HPV VACCINES  Aged Out       Assessment  This is a routine wellness examination for Garrett Hughes.  Health Maintenance: Due or Overdue Health Maintenance Due  Topic Date Due   COVID-19 Vaccine (1) Never done   FOOT EXAM  Never done   OPHTHALMOLOGY EXAM  Never done   Zoster Vaccines- Shingrix (1 of 2) Never done   HEMOGLOBIN A1C  07/25/2020    [place] does not need a referral for Community Assistance: Care Management:   no Social Work:    no Prescription Assistance:  no Nutrition/Diabetes Education:  no   Plan:  Personalized Goals  Goals Addressed               This Visit's Progress     Patient Stated (pt-stated)        Patient would like to walk better.        Personalized Health Maintenance & Screening Recommendations  Foot exam Influenza vaccine Eye exam Hemoglobin A1C Shingles vaccine Pneumonia vaccine  Patient declined the vaccines at this time.  Lung Cancer Screening Recommended: no (Low Dose CT Chest recommended if Age 28-80 years, 30 pack-year currently smoking OR have quit w/in past 15 years) Hepatitis C Screening recommended: no HIV Screening recommended: no  Advanced Directives: Written information was not prepared per patient's request.  Referrals & Orders No orders of the defined types were placed in this encounter.   Follow-up Plan Follow-up with Samuel Bouche, NP as planned Schedule eye exam.  Discuss 6 CIT results with PCP at the appointment on 11/29/22. Medicare wellness visit in one year.  AVS printed and mailed to the patient.    I have personally reviewed and noted the following in the patient's chart:   Medical and social history Use of alcohol, tobacco or illicit drugs  Current medications and supplements Functional ability and status Nutritional  status Physical activity Advanced directives List of other physicians Hospitalizations, surgeries, and ER visits in previous 12 months Vitals Screenings to include cognitive, depression, and falls Referrals and appointments  In addition, I have reviewed and discussed with Garrett Hughes certain preventive protocols, quality metrics, and best practice recommendations. A written personalized care plan for preventive services as well as general preventive health recommendations is available and can be mailed to the patient at his request.      Tinnie Gens, RN BSN  11/22/2022

## 2022-11-22 NOTE — Patient Instructions (Addendum)
[facility] Maintenance Summary and Written Plan of Care  [name] ,  Thank you for allowing me to perform your Medicare Annual Wellness Visit and for your ongoing commitment to your health.   Health Maintenance & Immunization History Health Maintenance  Topic Date Due  . OPHTHALMOLOGY EXAM  11/22/2022 (Originally 10/23/1950)  . FOOT EXAM  11/23/2022 (Originally 10/23/1950)  . INFLUENZA VACCINE  12/08/2022 (Originally 04/09/2022)  . COVID-19 Vaccine (1) 12/08/2022 (Originally 10/23/1945)  . HEMOGLOBIN A1C  12/23/2022 (Originally 07/25/2020)  . Zoster Vaccines- Shingrix (1 of 2) 02/22/2023 (Originally 10/24/1959)  . Pneumonia Vaccine 14+ Years old (1 of 2 - PCV) 05/31/2023 (Originally 10/23/1946)  . DTaP/Tdap/Td (2 - Td or Tdap) 09/14/2028  . HPV VACCINES  Aged Out   Immunization History  Administered Date(s) Administered  . Fluad Quad(high Dose 65+) 05/18/2019  . Influenza, High Dose Seasonal PF 09/14/2018, 07/13/2020  . Influenza,inj,Quad PF,6+ Mos 05/06/2017  . Tdap 09/14/2018    These are the patient goals that we discussed:  Goals Addressed              This Visit's Progress   .  Patient Stated (pt-stated)        Patient would like to walk better.         This is a list of Health Maintenance Items that are overdue or due now: Foot exam Influenza vaccine Eye exam Hemoglobin A1C Shingles vaccine Pneumonia vaccine  Patient declined the vaccines at this time.  Orders/Referrals Placed Today: No orders of the defined types were placed in this encounter.  (Contact our referral department at [phone] if you have not spoken with someone about your referral appointment within the next 5 days)    Follow-up Plan Follow-up with Samuel Bouche, NP as planned Schedule eye exam.  Discuss 6 CIT results with PCP at the appointment on 11/29/22. Medicare wellness visit in one year.  AVS printed and mailed to the patient.       Health  Maintenance, Male Adopting a healthy lifestyle and getting preventive care are important in promoting health and wellness. Ask your health care provider about: The right schedule for you to have regular tests and exams. Things you can do on your own to prevent diseases and keep yourself healthy. What should I know about diet, weight, and exercise? Eat a healthy diet  Eat a diet that includes plenty of vegetables, fruits, low-fat dairy products, and lean protein. Do not eat a lot of foods that are high in solid fats, added sugars, or sodium. Maintain a healthy weight Body mass index (BMI) is a measurement that can be used to identify possible weight problems. It estimates body fat based on height and weight. Your health care provider can help determine your BMI and help you achieve or maintain a healthy weight. Get regular exercise Get regular exercise. This is one of the most important things you can do for your health. Most adults should: Exercise for at least 150 minutes each week. The exercise should increase your heart rate and make you sweat (moderate-intensity exercise). Do strengthening exercises at least twice a week. This is in addition to the moderate-intensity exercise. Spend less time sitting. Even light physical activity can be beneficial. Watch cholesterol and blood lipids Have your blood tested for lipids and cholesterol at 82 years of age, then have this test every 5 years. You may need to have your cholesterol levels checked more often if: Your lipid or cholesterol levels are  high. You are older than 82 years of age. You are at high risk for heart disease. What should I know about cancer screening? Many types of cancers can be detected early and may often be prevented. Depending on your health history and family history, you may need to have cancer screening at various ages. This may include screening for: Colorectal cancer. Prostate cancer. Skin cancer. Lung cancer. What  should I know about heart disease, diabetes, and high blood pressure? Blood pressure and heart disease High blood pressure causes heart disease and increases the risk of stroke. This is more likely to develop in people who have high blood pressure readings or are overweight. Talk with your health care provider about your target blood pressure readings. Have your blood pressure checked: Every 3-5 years if you are 26-52 years of age. Every year if you are 8 years old or older. If you are between the ages of 9 and 37 and are a current or former smoker, ask your health care provider if you should have a one-time screening for abdominal aortic aneurysm (AAA). Diabetes Have regular diabetes screenings. This checks your fasting blood sugar level. Have the screening done: Once every three years after age 25 if you are at a normal weight and have a low risk for diabetes. More often and at a younger age if you are overweight or have a high risk for diabetes. What should I know about preventing infection? Hepatitis B If you have a higher risk for hepatitis B, you should be screened for this virus. Talk with your health care provider to find out if you are at risk for hepatitis B infection. Hepatitis C Blood testing is recommended for: Everyone born from [birth year] through 1965. Anyone with known risk factors for hepatitis C. Sexually transmitted infections (STIs) You should be screened each year for STIs, including gonorrhea and chlamydia, if: You are sexually active and are younger than 82 years of age. You are older than 82 years of age and your health care provider tells you that you are at risk for this type of infection. Your sexual activity has changed since you were last screened, and you are at increased risk for chlamydia or gonorrhea. Ask your health care provider if you are at risk. Ask your health care provider about whether you are at high risk for HIV. Your health care provider may recommend a  prescription medicine to help prevent HIV infection. If you choose to take medicine to prevent HIV, you should first get tested for HIV. You should then be tested every 3 months for as long as you are taking the medicine. Follow these instructions at home: Alcohol use Do not drink alcohol if your health care provider tells you not to drink. If you drink alcohol: Limit how much you have to 0-2 drinks a day. Know how much alcohol is in your drink. In the [place] one drink equals one 12 oz bottle of beer (355 mL), one 5 oz glass of wine (148 mL), or one 1 oz glass of hard liquor (44 mL). Lifestyle Do not use any products that contain nicotine or tobacco. These products include cigarettes, chewing tobacco, and vaping devices, such as e-cigarettes. If you need help quitting, ask your health care provider. Do not use street drugs. Do not share needles. Ask your health care provider for help if you need support or information about quitting drugs. General instructions Schedule regular health, dental, and eye exams. Stay current with your vaccines. Tell your  health care provider if: You often feel depressed. You have ever been abused or do not feel safe at home. Summary Adopting a healthy lifestyle and getting preventive care are important in promoting health and wellness. Follow your health care provider's instructions about healthy diet, exercising, and getting tested or screened for diseases. Follow your health care provider's instructions on monitoring your cholesterol and blood pressure. This information is not intended to replace advice given to you by your health care provider. Make sure you discuss any questions you have with your health care provider. Document Revised: 01/15/2021 Document Reviewed: 01/15/2021 Elsevier Patient Education  [place].

## 2022-11-23 ENCOUNTER — Other Ambulatory Visit: Payer: Self-pay | Admitting: Medical-Surgical

## 2022-11-28 ENCOUNTER — Ambulatory Visit: Payer: Medicare Other | Admitting: Medical-Surgical

## 2022-11-29 ENCOUNTER — Encounter: Payer: Self-pay | Admitting: Medical-Surgical

## 2022-11-29 ENCOUNTER — Ambulatory Visit (INDEPENDENT_AMBULATORY_CARE_PROVIDER_SITE_OTHER): Payer: Medicare Other | Admitting: Medical-Surgical

## 2022-11-29 VITALS — BP 124/59 | HR 60 | Resp 20 | Ht 66.0 in

## 2022-11-29 DIAGNOSIS — G479 Sleep disorder, unspecified: Secondary | ICD-10-CM | POA: Diagnosis not present

## 2022-11-29 DIAGNOSIS — G8929 Other chronic pain: Secondary | ICD-10-CM

## 2022-11-29 MED ORDER — TRAMADOL HCL 50 MG PO TABS: 50.0000 mg | ORAL_TABLET | Freq: Three times a day (TID) | ORAL | 0 refills | Status: AC | PRN

## 2022-11-29 MED ORDER — TRAZODONE HCL 100 MG PO TABS: 100.0000 mg | ORAL_TABLET | Freq: Every evening | ORAL | 1 refills | Status: DC | PRN

## 2022-11-29 NOTE — Progress Notes (Signed)
Patient looks to be admitted my recording thing is  Established Patient Office Visit  Subjective   Patient ID: Garrett Hughes, male   DOB: 1940/10/25 Age: 82 y.o. MRN: [id number]   Chief Complaint  Patient presents with   Follow-up    HPI Pleasant 82 year old male accompanied by his wife presenting today for chronic disease follow-up.  Since their last visit, he has been doing well overall however he complains of severe pain in the buttocks.  They are using multiple approaches at home including foam cushions, pressure reducing cushions, pressure reducing pads, topical salve, etc.  He reports that he has been taking Tylenol for pain but it does not seem to help.  He cannot lay flat on his back and prefers to sit up because they say it worsens his urinary issues when he lays down.  Has been taking trazodone 50 mg nightly however this does nothing more than make him a little drowsy to the point that he naps.  He does not sleep through the night requiring someone to stay up with him to help with his care needs.   Objective:    Vitals:   11/29/22 1448  BP: (!) 124/59  Pulse: 60  Resp: 20  Height: 5\' 6"  (1.676 m)  Weight: Comment: WHEEL CHAIR  SpO2: 94%    Physical Exam Vitals reviewed.  Constitutional:      General: He is not in acute distress.    Appearance: Normal appearance. He is not ill-appearing.  HENT:     Head: Normocephalic.  Cardiovascular:     Rate and Rhythm: Normal rate.     Pulses: Normal pulses.     Heart sounds: Normal heart sounds. No murmur heard.    No friction rub. No gallop.  Pulmonary:     Effort: Pulmonary effort is normal. No respiratory distress.     Breath sounds: Normal breath sounds.  Skin:    General: Skin is warm and dry.  Neurological:     Mental Status: He is alert and oriented to person, place, and time.  Psychiatric:        Mood and Affect: Mood normal.        Behavior: Behavior normal.        Thought Content: Thought content normal.         Judgment: Judgment normal.   No results found for this or any previous visit (from the past 24 hour(s)).     The ASCVD Risk score (Arnett DK, et al., 2019) failed to calculate for the following reasons:   The 2019 ASCVD risk score is only valid for ages 13 to 76   The patient has a prior MI or stroke diagnosis   Assessment & Plan:   1. Other chronic pain Syndrome and a small quantity of tramadol.  Advised to take a tramadol along with Tylenol every 8 hours as needed for pain.  Since we are at a point of quality versus quantity, referring to pain management to help with chronic pain and improve his quality of life.  Continue conservative measures at home. - traMADol (ULTRAM) 50 MG tablet; Take 1 tablet (50 mg total) by mouth every 8 (eight) hours as needed for up to 5 days.  Dispense: 15 tablet; Refill: 0 - Ambulatory referral to Pain Clinic  2. Trouble in sleeping Increasing trazodone to 100 mg nightly.  Return in about 6 months (around 06/01/2023) for chronic disease follow up. ___________________________________________ Clearnce Sorrel, DNP, APRN, FNP-BC Primary Care and  [place]

## 2022-12-05 DIAGNOSIS — H2513 Age-related nuclear cataract, bilateral: Secondary | ICD-10-CM | POA: Diagnosis not present

## 2022-12-11 NOTE — Progress Notes (Signed)
Remote pacemaker transmission.   

## 2022-12-20 DIAGNOSIS — M47816 Spondylosis without myelopathy or radiculopathy, lumbar region: Secondary | ICD-10-CM | POA: Diagnosis not present

## 2022-12-20 DIAGNOSIS — Z79891 Long term (current) use of opiate analgesic: Secondary | ICD-10-CM | POA: Diagnosis not present

## 2022-12-20 DIAGNOSIS — M5136 Other intervertebral disc degeneration, lumbar region: Secondary | ICD-10-CM | POA: Diagnosis not present

## 2022-12-20 DIAGNOSIS — G894 Chronic pain syndrome: Secondary | ICD-10-CM | POA: Diagnosis not present

## 2022-12-20 DIAGNOSIS — L89109 Pressure ulcer of unspecified part of back, unspecified stage: Secondary | ICD-10-CM | POA: Diagnosis not present

## 2022-12-20 DIAGNOSIS — Z5181 Encounter for therapeutic drug level monitoring: Secondary | ICD-10-CM | POA: Diagnosis not present

## 2023-02-05 ENCOUNTER — Ambulatory Visit (INDEPENDENT_AMBULATORY_CARE_PROVIDER_SITE_OTHER): Payer: Medicare Other

## 2023-02-05 DIAGNOSIS — I442 Atrioventricular block, complete: Secondary | ICD-10-CM

## 2023-02-06 LAB — CUP PACEART REMOTE DEVICE CHECK
Battery Remaining Longevity: 53 mo
Battery Remaining Percentage: 65 %
Battery Voltage: 2.99 V
Brady Statistic RV Percent Paced: 99 %
Date Time Interrogation Session: 20240529022942
Implantable Lead Connection Status: 753985
Implantable Lead Implant Date: 20210517
Implantable Lead Location: 753860
Implantable Pulse Generator Implant Date: 20210517
Lead Channel Impedance Value: 440 Ohm
Lead Channel Pacing Threshold Amplitude: 0.75 V
Lead Channel Pacing Threshold Pulse Width: 0.5 ms
Lead Channel Sensing Intrinsic Amplitude: 11 mV
Lead Channel Setting Pacing Amplitude: 3.5 V
Lead Channel Setting Pacing Pulse Width: 0.5 ms
Lead Channel Setting Sensing Sensitivity: 4 mV
Pulse Gen Model: 1272
Pulse Gen Serial Number: 3805346

## 2023-02-27 NOTE — Progress Notes (Signed)
Remote pacemaker transmission.   

## 2023-03-27 ENCOUNTER — Encounter: Payer: Self-pay | Admitting: Medical-Surgical

## 2023-03-27 ENCOUNTER — Ambulatory Visit: Payer: Medicare Other | Admitting: Medical-Surgical

## 2023-03-27 VITALS — BP 144/81 | HR 60 | Resp 18 | Ht 66.0 in | Wt 94.5 lb

## 2023-03-27 DIAGNOSIS — G479 Sleep disorder, unspecified: Secondary | ICD-10-CM | POA: Diagnosis not present

## 2023-03-27 DIAGNOSIS — F05 Delirium due to known physiological condition: Secondary | ICD-10-CM | POA: Diagnosis not present

## 2023-03-27 DIAGNOSIS — R54 Age-related physical debility: Secondary | ICD-10-CM

## 2023-03-27 DIAGNOSIS — J449 Chronic obstructive pulmonary disease, unspecified: Secondary | ICD-10-CM | POA: Diagnosis not present

## 2023-03-27 MED ORDER — TRAZODONE HCL 150 MG PO TABS: 150.0000 mg | ORAL_TABLET | Freq: Every evening | ORAL | 0 refills | Status: DC | PRN

## 2023-03-27 MED ORDER — RISPERIDONE 0.25 MG PO TABS: 0.2500 mg | ORAL_TABLET | Freq: Two times a day (BID) | ORAL | 1 refills | Status: DC

## 2023-03-27 NOTE — Progress Notes (Signed)
        Established patient visit  History, exam, impression, and plan:  1. Age-related physical debility 2. 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 Sundowning Pleasant 82 year old male accompanied by multiple family members presenting today to discuss worsening of his physical and mental condition.  He has significant issues with age-related physical debility and has lost quite a bit of weight in the last 8 to 10 months.  He has periods where he will eat well and then others when he slacks off.  His wife is his caregiver however her health is also starting to fail.  Notes that his periods of confusion are getting significantly worse and he is now having sundowning behaviors when he gets very combative and is difficult to get settled.  Most of his confusion and combativeness occurs later in the afternoon/evening and can extend throughout the night.  He is currently taking trazodone 100 mg nightly however this does not seem to be helping with sleep or mood management.  Patient notes that he is "the meanest person you will ever meet" and he is "never satisfied". Also notes that he just wishes "someone would beat me in the head with a hammer". Family is requesting assistance in finding a facility for him since his condition is no longer manageable at home. Discussed various options. Referral for social work placed urgently. Adding Risperidone 0.25mg  twice daily for management of confusion/combative behavior. Was previously in hospice but got better and was discharged. Advised family to reach out for reevaluation given the recent quick decline as he may again qualify for services.  - Ambulatory referral to Social Work  4. Chronic obstructive pulmonary disease, unspecified COPD type (HCC) Endorses occasional difficulty breathing in the setting of known COPD.  He does have significant issues with posture given the curvature of his spine which also limits his ability to take deep breaths.  Today, lungs  CTA.  Respirations even and unlabored.  We are referring to social work as noted above in hopes of finding appropriate services to help his overall health management. - Ambulatory referral to Social Work  5. Trouble in sleeping Continues to have significant difficulty with sleeping.  He is taking trazodone 100 mg nightly however this has not been helpful.  Family admits that he does sleep quite a bit during the day and takes Naps throughout the night.  We discussed expectations that if he is allowed to sleep during the day, we will not be sleeping at night despite treatment with trazodone.  Plan to increase trazodone to 100 mg nightly.  Adding risperidone as noted above.  Plan to try to keep him awake during the day so that he will be more apt to sleep at night. - Ambulatory referral to Social Work  Procedures performed this visit: None.  Return if symptoms worsen or fail to improve.  __________________________________ Thayer Ohm, DNP, APRN, FNP-BC Primary Care and Sports Medicine [facility] [place]

## 2023-03-28 ENCOUNTER — Telehealth: Payer: Self-pay | Admitting: *Deleted

## 2023-03-28 NOTE — Progress Notes (Signed)
  Care Coordination   Note   03/28/2023 Name: Garrett Hughes MRN: [id number] DOB: [birth year]  Garrett Hughes is a 82 y.o. year old male who sees Christen Butter, NP for primary care. I reached out to [employer] by phone today to offer care coordination services.  [name] was given information about Care Coordination services today including:   The Care Coordination services include support from the care team which includes your Nurse Coordinator, Clinical Social Worker, or Pharmacist.  The Care Coordination team is here to help remove barriers to the health concerns and goals most important to you. Care Coordination services are voluntary, and the patient may decline or stop services at any time by request to their care team member.   Care Coordination Consent Status: Patient agreed to services and verbal consent obtained.   Follow up plan:  Telephone appointment with care coordination team member scheduled for:  03/31/2023  Encounter Outcome:  Pt. Scheduled from referral   Burman Nieves, [facility] Care Coordination Care Guide Direct Dial: [phone]

## 2023-03-31 ENCOUNTER — Ambulatory Visit: Payer: Self-pay | Admitting: Licensed Clinical Social Worker

## 2023-03-31 NOTE — Patient Outreach (Signed)
  Care Coordination  Initial Visit Note   03/31/2023 Name: Garrett Hughes MRN: [id number] DOB: 1941-04-11  Garrett Hughes is a 82 y.o. year old male who sees Christen Butter, NP for primary care. I spoke with  Noel Christmas' s wife by phone today.  What matters to the patients health and wellness today?  Moving forward with placement  Patient currently lives with wife and sister.  Son and daughter-in-law also provides support.  Reviewed placement process. Wife will review mailed information and call LCSW with questions.   Goals Addressed             This Visit's Progress    Caregiver support for Facility Placement       Activities and task to complete in order to accomplish goals.   Call or go to Department of Social Services  to apply for Medicaid for long-term care [address] [facility] [facility]  [phone] Review facilities from the list provided call and go visit Review booklet ''When a loved one need long-term care" (  )  Per your request all information has been mailed to you. Facility list for [facility] and [facility].       SDOH assessments and interventions completed:  No   Care Coordination Interventions:  Yes, provided  Interventions Today    Flowsheet Row Most Recent Value  Chronic Disease   Chronic disease during today's visit Hypertension (HTN), Chronic Kidney Disease/End Stage Renal Disease (ESRD), Congestive Heart Failure (CHF), Chronic Obstructive Pulmonary Disease (COPD), Diabetes  General Interventions   General Interventions Discussed/Reviewed General Interventions Discussed, Level of Care, Communication with  Communication with PCP/Specialists  Level of Care Assisted Living, Skilled Nursing Facility  [reviewed levels of care and placement process]  Education Interventions   Education Provided Provided Education, Provided Printed Education  Provided Verbal Education On Community Resources  Mental Health Interventions   Mental Health Discussed/Reviewed  Coping Strategies  Safety Interventions   Safety Discussed/Reviewed Safety Reviewed       Follow up plan: Follow up call scheduled for 2 weeks    Encounter Outcome:  Pt. Visit Completed   Sammuel Hines, LCSW Social Work Care Coordination  [facility] [employer] [employer] [phone]

## 2023-03-31 NOTE — Patient Instructions (Signed)
Social Work Visit Information  Thank you for taking time to visit with me today. Please don't hesitate to contact me if I can be of assistance to you.   Following are the goals we discussed today:   Goals Addressed             This Visit's Progress    Caregiver support for Facility Placement       Activities and task to complete in order to accomplish goals.   Call or go to Department of Social Services  to apply for Medicaid for long-term care [address] [facility]. [facility]  [phone] Review facilities from the list provided call and go visit Review booklet ''When a loved one need long-term care" (  )  Per your request all information has been mailed to you. Facility list for [facility] and [facility].        Our next appointment is by telephone on 04/15/23 at 10:00   Please call the care guide team at [phone] if you need to cancel or reschedule your appointment.   If you or anyone you know are experiencing a Mental Health or Behavioral Health Crisis or need someone to talk to, please call the Suicide and Crisis Lifeline: 988 call the [place] National Suicide Prevention Lifeline: (740)089-0920 or TTY: 351-381-6657 TTY (907)107-1632) to talk to a trained counselor call 1-800-273-TALK (toll free, 24 hour hotline) go to [facility] [address] [place] [phone])   Patient verbalizes understanding of instructions and care plan provided today and agrees to view in MyChart. Active MyChart status and patient understanding of how to access instructions and care plan via MyChart confirmed with patient.      Sammuel Hines, LCSW Social Work Care Coordination  [facility] [employer] [phone]

## 2023-04-01 ENCOUNTER — Other Ambulatory Visit: Payer: Self-pay | Admitting: Medical-Surgical

## 2023-04-15 ENCOUNTER — Ambulatory Visit: Payer: Self-pay | Admitting: Licensed Clinical Social Worker

## 2023-04-15 ENCOUNTER — Telehealth: Payer: Self-pay

## 2023-04-15 NOTE — Telephone Encounter (Signed)
Morrie Sheldon with [facility] called and states the family requested hospice care. She is needing verbal orders for the ok to hospice care.

## 2023-04-15 NOTE — Patient Instructions (Signed)
Social Work Visit Information  Thank you for taking time to visit with me today. Please don't hesitate to contact me if I can be of assistance to you.   Following are the goals we discussed today:   Goals Addressed             This Visit's Progress    Caregiver support for Facility Placement       Activities and task to complete in order to accomplish goals.   Call or go to Department of Social Services  to apply for Medicaid for long-term care [address] [facility]. [facility]  [phone] Review facilities from the list provided call and go visit Review booklet ''When a loved one need long-term care" (  )  Per your request all information has been mailed to you. Facility list for [facility] and [facility]. I am glad you were able to go visit one of the facilities in [place]        Our next appointment is by telephone on 04/24/23 at 10;15   Please call the care guide team at [phone] if you need to cancel or reschedule your appointment.   If you or anyone you know are experiencing a Mental Health or Behavioral Health Crisis or need someone to talk to, please call the Suicide and Crisis Lifeline: 988 call the [place] National Suicide Prevention Lifeline: 617-779-6583 or TTY: 309-504-9787 TTY 5010427196) to talk to a trained counselor call 1-800-273-TALK (toll free, 24 hour hotline) go to [facility] [address], [place] [phone])   Patient 's wife verbalizes understanding of instructions and care plan provided today and agrees to view in MyChart. Active MyChart status and patient understanding of how to access instructions and care plan via MyChart confirmed with patient.      Sammuel Hines, LCSW Social Work Care Coordination  [facility] [employer] [employer] [phone]

## 2023-04-15 NOTE — Patient Outreach (Signed)
  Care Coordination   Follow Up Visit Note   04/15/2023 Name: ZEVI MARMOL MRN: [id number] DOB: Aug 29, 1941  Clementeen Graham Buswell is a 82 y.o. year old male who sees Christen Butter, NP for primary care. I spoke with  Clementeen Graham Driver's wife by phone today.  What matters to the patients health and wellness today?    Taking care of husband while managing caregiver stress.  Wife has not applied for long-term care medicaid, however they have visited a facility. LCSW will continue to assist   Goals Addressed             This Visit's Progress    Caregiver support for Facility Placement       Activities and task to complete in order to accomplish goals.   Call or go to Department of Social Services  to apply for Medicaid for long-term care [address] [facility]. [facility]  [phone] Review facilities from the list provided call and go visit Review booklet ''When a loved one need long-term care" (  )  Per your request all information has been mailed to you. Facility list for [facility] and [facility]. I am glad you were able to go visit one of the facilities in [place]       SDOH assessments and interventions completed:  No   Care Coordination Interventions:  Yes, provided  Interventions Today    Flowsheet Row Most Recent Value  Chronic Disease   Chronic disease during today's visit Hypertension (HTN), Chronic Obstructive Pulmonary Disease (COPD)  General Interventions   General Interventions Discussed/Reviewed Level of Care, General Interventions Reviewed  Level of Care Assisted Living, Skilled Nursing Facility  [family visited facility in [place] has not applied for long-term care Medicaid]  Education Interventions   Education Provided Provided Education  Safety Interventions   Safety Discussed/Reviewed Home Safety  [wife reports has grandchildren helping with patient at this time]       Follow up plan: Follow up call scheduled for 04/24/23    Encounter Outcome:  Pt. Visit  Completed   Sammuel Hines, LCSW Social Work Care Coordination  [facility] [employer] [employer] [phone]

## 2023-04-16 ENCOUNTER — Telehealth: Payer: Self-pay | Admitting: Medical-Surgical

## 2023-04-16 NOTE — Telephone Encounter (Signed)
Ashley advised. 

## 2023-04-16 NOTE — Telephone Encounter (Signed)
Pt's daughter stopped in at the end of day ton inquire about an FL2 form needed for admissions to [facility] and rehab here in [place]. Please contact Daymon Larsen at [phone]. Okay to leave message on voicemail.

## 2023-04-17 ENCOUNTER — Telehealth: Payer: Self-pay | Admitting: Medical-Surgical

## 2023-04-17 NOTE — Telephone Encounter (Signed)
Daughter advised.

## 2023-04-17 NOTE — Telephone Encounter (Signed)
Faxed to [facility] in [place]  ATTN: Bethena Roys  Fax #: [phone]

## 2023-04-17 NOTE — Telephone Encounter (Signed)
Daughter called in to add documents that needed to be sent in addition to the [facility]. She needs a separate medication list, Demographics sheet, and current history/physical within the past 30 days. She would like all documents emailed and faxed.  ATTN: Bethena Roys [email or web address] Fax:[phone]

## 2023-04-17 NOTE — Telephone Encounter (Signed)
The FL2, medication list, and problem list have all been faxed as requested.  Please contact Fredonia Highland to update her of its completion.  ___________________________________________ Thayer Ohm, DNP, APRN, FNP-BC Primary Care and Sports Medicine [facility] [place]

## 2023-04-18 ENCOUNTER — Telehealth: Payer: Self-pay | Admitting: Medical-Surgical

## 2023-04-18 NOTE — Telephone Encounter (Signed)
Patient daughter - terry -informed.

## 2023-04-18 NOTE — Telephone Encounter (Signed)
Patient's daughter in law dropped off a Long Term Care form to be completed, faxed and emailed. Paperwork is in [employer].

## 2023-04-18 NOTE — Telephone Encounter (Signed)
Contacted Garrett Hughes and she did not realize that we have already faxed the FL-2 form yesterday. I made a copy for our records and placed the original at the front for Garrett Hughes to pick up and take to the facility.

## 2023-04-19 ENCOUNTER — Other Ambulatory Visit: Payer: Self-pay | Admitting: Medical-Surgical

## 2023-04-23 ENCOUNTER — Other Ambulatory Visit: Payer: Self-pay | Admitting: Medical-Surgical

## 2023-04-24 ENCOUNTER — Ambulatory Visit: Payer: Self-pay | Admitting: Licensed Clinical Social Worker

## 2023-04-24 NOTE — Patient Outreach (Signed)
  Care Coordination  Follow Up Visit Note   04/24/2023 Name: Garrett Hughes MRN: [id number] DOB: [birth year]  Garrett Hughes is a 82 y.o. year old male who sees Christen Butter, NP for primary care. I spoke with  Garrett Hughes 's wife by phone today.  What matters to the patients health and wellness today?    They are currently connected to Hospice for ongoing support.  Hospice Social Worker is scheduled for a home visit today. No additional support needed from LCSW at this time.   Goals Addressed             This Visit's Progress    COMPLETED: Caregiver support for Facility Placement       Activities and task to complete in order to accomplish goals.   Review booklet ''When a loved one need long-term care" ( )  I am glad you were able to go visit one of the facilities in [place] You are currently working with [employer] they will assist you with all support going forward       SDOH assessments and interventions completed:  No   Care Coordination Interventions:  Yes, provided  Interventions Today    Flowsheet Row Most Recent Value  General Interventions   General Interventions Discussed/Reviewed General Interventions Reviewed, Level of Care  Level of Care Skilled Nursing Facility  [patient is active with Hospice Services.  Hospice Social Worker scheduled for home visit today]       Follow up plan: No further intervention required.   Encounter Outcome:  Pt. Visit Completed   Sammuel Hines, LCSW Social Work Care Coordination  [facility] [employer] [phone]

## 2023-04-24 NOTE — Patient Instructions (Signed)
Social Work Visit Information  Thank you for taking time to visit with me today. Please don't hesitate to contact me if I can be of assistance to you.   Following are the goals we discussed today:   Goals Addressed             This Visit's Progress    COMPLETED: Caregiver support for Facility Placement       Activities and task to complete in order to accomplish goals.   Review booklet ''When a loved one need long-term care" ( )  I am glad you were able to go visit one of the facilities in [place] You are currently working with [employer] they will assist you with all support going forward        No follow up scheduled with social work at this time.   Please call the care guide team at [phone] if you need to cancel or reschedule your appointment.   If you or anyone you know are experiencing a Mental Health or Behavioral Health Crisis or need someone to talk to, please call the Suicide and Crisis Lifeline: 988 call the [place] National Suicide Prevention Lifeline: (979)544-8208 or TTY: 223 169 1460 TTY (586) 048-2745) to talk to a trained counselor call 1-800-273-TALK (toll free, 24 hour hotline) go to [facility] [address] [place] [phone])   Patient 's wife verbalizes understanding of instructions and care plan provided today and agrees to view in MyChart. Active MyChart status and patient understanding of how to access instructions and care plan via MyChart confirmed with patient.       Sammuel Hines, LCSW Social Work Care Coordination  [facility] [employer] [phone]

## 2023-04-30 ENCOUNTER — Other Ambulatory Visit: Payer: Self-pay | Admitting: Medical-Surgical

## 2023-05-07 ENCOUNTER — Ambulatory Visit (INDEPENDENT_AMBULATORY_CARE_PROVIDER_SITE_OTHER)

## 2023-05-07 DIAGNOSIS — I442 Atrioventricular block, complete: Secondary | ICD-10-CM | POA: Diagnosis not present

## 2023-05-08 LAB — CUP PACEART REMOTE DEVICE CHECK
Battery Remaining Longevity: 49 mo
Battery Remaining Percentage: 62 %
Battery Voltage: 2.99 V
Brady Statistic RV Percent Paced: 99 %
Date Time Interrogation Session: 20240828031040
Implantable Lead Connection Status: 753985
Implantable Lead Implant Date: 20210517
Implantable Lead Location: 753860
Implantable Pulse Generator Implant Date: 20210517
Lead Channel Impedance Value: 430 Ohm
Lead Channel Pacing Threshold Amplitude: 0.75 V
Lead Channel Pacing Threshold Pulse Width: 0.5 ms
Lead Channel Sensing Intrinsic Amplitude: 8.8 mV
Lead Channel Setting Pacing Amplitude: 3.5 V
Lead Channel Setting Pacing Pulse Width: 0.5 ms
Lead Channel Setting Sensing Sensitivity: 4 mV
Pulse Gen Model: 1272
Pulse Gen Serial Number: 3805346

## 2023-05-16 NOTE — Progress Notes (Signed)
Remote pacemaker transmission.   

## 2023-05-20 ENCOUNTER — Other Ambulatory Visit: Payer: Self-pay | Admitting: Medical-Surgical

## 2023-05-29 ENCOUNTER — Other Ambulatory Visit: Payer: Self-pay | Admitting: Medical-Surgical

## 2023-06-02 ENCOUNTER — Encounter: Payer: Self-pay | Admitting: Medical-Surgical

## 2023-06-02 ENCOUNTER — Ambulatory Visit (INDEPENDENT_AMBULATORY_CARE_PROVIDER_SITE_OTHER): Admitting: Medical-Surgical

## 2023-06-02 VITALS — BP 138/71 | HR 60 | Resp 20 | Ht 66.0 in

## 2023-06-02 DIAGNOSIS — I5033 Acute on chronic diastolic (congestive) heart failure: Secondary | ICD-10-CM

## 2023-06-02 DIAGNOSIS — E1122 Type 2 diabetes mellitus with diabetic chronic kidney disease: Secondary | ICD-10-CM

## 2023-06-02 DIAGNOSIS — N401 Enlarged prostate with lower urinary tract symptoms: Secondary | ICD-10-CM

## 2023-06-02 DIAGNOSIS — Z515 Encounter for palliative care: Secondary | ICD-10-CM

## 2023-06-02 DIAGNOSIS — I482 Chronic atrial fibrillation, unspecified: Secondary | ICD-10-CM

## 2023-06-02 DIAGNOSIS — I10 Essential (primary) hypertension: Secondary | ICD-10-CM

## 2023-06-02 DIAGNOSIS — R350 Frequency of micturition: Secondary | ICD-10-CM

## 2023-06-02 NOTE — Progress Notes (Signed)
        Established patient visit  History, exam, impression, and plan:  1. Hospice care patient Very pleasant 82 year old male presenting today with an extensive medical diagnosis list.  He is currently under the care of hospice with [facility].  Reports they are very happy with the services there and are receiving 1-2 visits per week to help with bathing, basic needs, etc.  Recommendations have been made regarding his medications and they report he is off Cardura at this point.  Requested they contact the hospice folks and have them send over updated information to our office regarding his medication list so we can make this more accurate.  2. Benign essential hypertension Blood pressure elevated on arrival today.  Previously taking Cardura 4 mg nightly but has been off of this for the last week or 2.  Using metoprolol 12.5 mg twice daily, tolerating well without side effects.  Not checking blood pressures at home.  On exam, no concerning cardiac findings.  Lungs clear with even unlabored respirations.  No peripheral edema.  Continue metoprolol.  Discussed restarting Cardura with hospice.  3. Chronic atrial fibrillation (HCC) Currently taking Plavix 75 mg daily and metoprolol as noted above.  Heart rate regular on exam.  Pacemaker in place.  No current concerns.  4. Acute on chronic diastolic CHF (congestive heart failure) (HCC) Taking medications as prescribed.  No current issues with peripheral edema or shortness of breath.  Deferring labs.  5. Type 2 diabetes mellitus with chronic kidney disease, without long-term current use of insulin, unspecified CKD stage (HCC) Reports that we no longer check hemoglobin A1c's on him and this has not been done for several years.  Since he is a hospice patient, deferring further evaluation today.  6. Benign prostatic hyperplasia with urinary frequency Discussed previous prescription of Cardura.  This was felt to be unhelpful and hospice  recommended stopping it.  Feel this was working for his blood pressure but recommended discussing with hospice if they feel this is an appropriate medication.   Procedures performed this visit: None.  Return in about 6 months (around 11/30/2023) for chronic disease follow up.  __________________________________ Thayer Ohm, DNP, APRN, FNP-BC Primary Care and Sports Medicine [facility]

## 2023-06-04 ENCOUNTER — Other Ambulatory Visit: Payer: Self-pay

## 2023-06-18 ENCOUNTER — Other Ambulatory Visit: Payer: Self-pay | Admitting: Medical-Surgical

## 2023-07-30 ENCOUNTER — Other Ambulatory Visit: Payer: Self-pay | Admitting: Medical-Surgical

## 2023-07-30 NOTE — Telephone Encounter (Signed)
This medication was last filled by a historical provider

## 2023-08-06 ENCOUNTER — Other Ambulatory Visit: Payer: Self-pay | Admitting: Medical-Surgical

## 2023-08-06 ENCOUNTER — Ambulatory Visit (INDEPENDENT_AMBULATORY_CARE_PROVIDER_SITE_OTHER)

## 2023-08-06 DIAGNOSIS — I442 Atrioventricular block, complete: Secondary | ICD-10-CM | POA: Diagnosis not present

## 2023-08-06 LAB — CUP PACEART REMOTE DEVICE CHECK
Battery Remaining Longevity: 48 mo
Battery Remaining Percentage: 59 %
Battery Voltage: 2.99 V
Brady Statistic RV Percent Paced: 99 %
Date Time Interrogation Session: 20241127025633
Implantable Lead Connection Status: 753985
Implantable Lead Implant Date: 20210517
Implantable Lead Location: 753860
Implantable Pulse Generator Implant Date: 20210517
Lead Channel Impedance Value: 440 Ohm
Lead Channel Pacing Threshold Amplitude: 0.75 V
Lead Channel Pacing Threshold Pulse Width: 0.5 ms
Lead Channel Sensing Intrinsic Amplitude: 10.2 mV
Lead Channel Setting Pacing Amplitude: 3.5 V
Lead Channel Setting Pacing Pulse Width: 0.5 ms
Lead Channel Setting Sensing Sensitivity: 4 mV
Pulse Gen Model: 1272
Pulse Gen Serial Number: 3805346

## 2023-09-21 ENCOUNTER — Other Ambulatory Visit: Payer: Self-pay | Admitting: Medical-Surgical

## 2023-11-05 ENCOUNTER — Ambulatory Visit: Payer: Medicare Other

## 2023-11-08 DEATH — deceased

## 2023-12-01 ENCOUNTER — Ambulatory Visit: Payer: Medicare Other | Admitting: Medical-Surgical

## 2023-12-23 ENCOUNTER — Other Ambulatory Visit: Payer: Self-pay | Admitting: Medical-Surgical

## 2024-02-04 ENCOUNTER — Ambulatory Visit: Payer: Medicare Other

## 2024-05-05 ENCOUNTER — Ambulatory Visit: Payer: Medicare Other

## 2024-08-04 ENCOUNTER — Ambulatory Visit: Payer: Medicare Other

## 2024-11-03 ENCOUNTER — Ambulatory Visit: Payer: Medicare Other
